# Patient Record
Sex: Female | Born: 1945
Health system: Southern US, Community
[De-identification: ages and names within clinical notes are randomized; demographics above are authoritative.]

## PROBLEM LIST (undated history)

## (undated) DIAGNOSIS — L309 Dermatitis, unspecified: Secondary | ICD-10-CM

## (undated) DIAGNOSIS — I10 Essential (primary) hypertension: Secondary | ICD-10-CM

## (undated) DIAGNOSIS — C50919 Malignant neoplasm of unspecified site of unspecified female breast: Secondary | ICD-10-CM

## (undated) DIAGNOSIS — E871 Hypo-osmolality and hyponatremia: Secondary | ICD-10-CM

## (undated) DIAGNOSIS — J189 Pneumonia, unspecified organism: Secondary | ICD-10-CM

## (undated) DIAGNOSIS — E7800 Pure hypercholesterolemia, unspecified: Secondary | ICD-10-CM

## (undated) DIAGNOSIS — E785 Hyperlipidemia, unspecified: Secondary | ICD-10-CM

## (undated) DIAGNOSIS — T7840XA Allergy, unspecified, initial encounter: Secondary | ICD-10-CM

## (undated) HISTORY — DX: Hyperlipidemia, unspecified: E78.5

## (undated) HISTORY — DX: Dermatitis, unspecified: L30.9

## (undated) HISTORY — DX: Pneumonia, unspecified organism: J18.9

## (undated) HISTORY — DX: Hypo-osmolality and hyponatremia: E87.1

## (undated) HISTORY — DX: Essential (primary) hypertension: I10

## (undated) HISTORY — PX: BREAST SURGERY: SHX581

## (undated) HISTORY — DX: Allergy, unspecified, initial encounter: T78.40XA

---

## 1998-04-09 ENCOUNTER — Other Ambulatory Visit: Admission: RE | Admit: 1998-04-09 | Discharge: 1998-04-09 | Payer: Self-pay | Admitting: Obstetrics and Gynecology

## 1999-05-07 ENCOUNTER — Other Ambulatory Visit: Admission: RE | Admit: 1999-05-07 | Discharge: 1999-05-07 | Payer: Self-pay | Admitting: Obstetrics and Gynecology

## 1999-12-02 ENCOUNTER — Encounter: Admission: RE | Admit: 1999-12-02 | Discharge: 1999-12-02 | Payer: Self-pay | Admitting: Hematology and Oncology

## 1999-12-02 ENCOUNTER — Encounter: Payer: Self-pay | Admitting: Hematology and Oncology

## 2000-02-24 ENCOUNTER — Encounter: Payer: Self-pay | Admitting: Hematology and Oncology

## 2000-02-24 ENCOUNTER — Encounter: Admission: RE | Admit: 2000-02-24 | Discharge: 2000-02-24 | Payer: Self-pay | Admitting: Hematology and Oncology

## 2000-06-06 ENCOUNTER — Other Ambulatory Visit: Admission: RE | Admit: 2000-06-06 | Discharge: 2000-06-06 | Payer: Self-pay | Admitting: Obstetrics and Gynecology

## 2000-07-12 ENCOUNTER — Encounter: Payer: Self-pay | Admitting: Hematology and Oncology

## 2000-07-12 ENCOUNTER — Encounter: Admission: RE | Admit: 2000-07-12 | Discharge: 2000-07-12 | Payer: Self-pay | Admitting: Hematology and Oncology

## 2000-07-17 ENCOUNTER — Encounter: Admission: RE | Admit: 2000-07-17 | Discharge: 2000-07-17 | Payer: Self-pay | Admitting: Hematology and Oncology

## 2000-07-17 ENCOUNTER — Encounter: Payer: Self-pay | Admitting: Hematology and Oncology

## 2001-02-20 ENCOUNTER — Encounter: Payer: Self-pay | Admitting: Hematology and Oncology

## 2001-02-20 ENCOUNTER — Encounter: Admission: RE | Admit: 2001-02-20 | Discharge: 2001-02-20 | Payer: Self-pay | Admitting: Hematology and Oncology

## 2001-08-03 ENCOUNTER — Encounter: Payer: Self-pay | Admitting: Hematology and Oncology

## 2001-08-03 ENCOUNTER — Encounter: Admission: RE | Admit: 2001-08-03 | Discharge: 2001-08-03 | Payer: Self-pay | Admitting: Hematology and Oncology

## 2001-10-30 ENCOUNTER — Other Ambulatory Visit: Admission: RE | Admit: 2001-10-30 | Discharge: 2001-10-30 | Payer: Self-pay | Admitting: Obstetrics and Gynecology

## 2001-12-31 ENCOUNTER — Encounter: Admission: RE | Admit: 2001-12-31 | Discharge: 2001-12-31 | Payer: Self-pay | Admitting: Specialist

## 2001-12-31 ENCOUNTER — Encounter: Payer: Self-pay | Admitting: Specialist

## 2002-05-10 ENCOUNTER — Encounter: Payer: Self-pay | Admitting: Hematology and Oncology

## 2002-05-10 ENCOUNTER — Ambulatory Visit (HOSPITAL_COMMUNITY): Admission: RE | Admit: 2002-05-10 | Discharge: 2002-05-10 | Payer: Self-pay | Admitting: Hematology and Oncology

## 2002-08-09 ENCOUNTER — Encounter: Payer: Self-pay | Admitting: Hematology and Oncology

## 2002-08-09 ENCOUNTER — Encounter: Admission: RE | Admit: 2002-08-09 | Discharge: 2002-08-09 | Payer: Self-pay | Admitting: Hematology and Oncology

## 2002-09-18 ENCOUNTER — Ambulatory Visit (HOSPITAL_COMMUNITY): Admission: RE | Admit: 2002-09-18 | Discharge: 2002-09-18 | Payer: Self-pay | Admitting: *Deleted

## 2002-09-18 ENCOUNTER — Encounter: Payer: Self-pay | Admitting: *Deleted

## 2002-11-14 ENCOUNTER — Other Ambulatory Visit: Admission: RE | Admit: 2002-11-14 | Discharge: 2002-11-14 | Payer: Self-pay | Admitting: Obstetrics and Gynecology

## 2002-11-18 ENCOUNTER — Ambulatory Visit (HOSPITAL_COMMUNITY): Admission: RE | Admit: 2002-11-18 | Discharge: 2002-11-18 | Payer: Self-pay | Admitting: Gastroenterology

## 2003-10-15 ENCOUNTER — Encounter: Admission: RE | Admit: 2003-10-15 | Discharge: 2003-10-15 | Payer: Self-pay | Admitting: Hematology and Oncology

## 2004-03-23 ENCOUNTER — Other Ambulatory Visit: Admission: RE | Admit: 2004-03-23 | Discharge: 2004-03-23 | Payer: Self-pay | Admitting: Obstetrics and Gynecology

## 2004-11-30 ENCOUNTER — Encounter: Admission: RE | Admit: 2004-11-30 | Discharge: 2004-11-30 | Payer: Self-pay | Admitting: Internal Medicine

## 2005-04-01 ENCOUNTER — Encounter: Admission: RE | Admit: 2005-04-01 | Discharge: 2005-04-01 | Payer: Self-pay | Admitting: Internal Medicine

## 2006-01-16 ENCOUNTER — Encounter: Admission: RE | Admit: 2006-01-16 | Discharge: 2006-01-16 | Payer: Self-pay | Admitting: Internal Medicine

## 2006-02-06 ENCOUNTER — Encounter: Admission: RE | Admit: 2006-02-06 | Discharge: 2006-02-06 | Payer: Self-pay | Admitting: Internal Medicine

## 2006-02-24 ENCOUNTER — Ambulatory Visit (HOSPITAL_BASED_OUTPATIENT_CLINIC_OR_DEPARTMENT_OTHER): Admission: RE | Admit: 2006-02-24 | Discharge: 2006-02-24 | Payer: Self-pay | Admitting: Surgery

## 2006-02-24 ENCOUNTER — Encounter: Admission: RE | Admit: 2006-02-24 | Discharge: 2006-02-24 | Payer: Self-pay | Admitting: Surgery

## 2006-02-24 ENCOUNTER — Encounter (INDEPENDENT_AMBULATORY_CARE_PROVIDER_SITE_OTHER): Payer: Self-pay | Admitting: Specialist

## 2006-11-22 ENCOUNTER — Ambulatory Visit: Payer: Self-pay | Admitting: Internal Medicine

## 2006-11-29 ENCOUNTER — Encounter: Payer: Self-pay | Admitting: Cardiology

## 2006-11-29 ENCOUNTER — Ambulatory Visit: Payer: Self-pay

## 2006-12-07 ENCOUNTER — Ambulatory Visit: Payer: Self-pay | Admitting: Internal Medicine

## 2007-03-09 ENCOUNTER — Encounter: Admission: RE | Admit: 2007-03-09 | Discharge: 2007-03-09 | Payer: Self-pay | Admitting: Obstetrics and Gynecology

## 2008-03-10 ENCOUNTER — Encounter: Admission: RE | Admit: 2008-03-10 | Discharge: 2008-03-10 | Payer: Self-pay | Admitting: Internal Medicine

## 2009-02-20 ENCOUNTER — Encounter: Admission: RE | Admit: 2009-02-20 | Discharge: 2009-02-20 | Payer: Self-pay | Admitting: Internal Medicine

## 2010-02-22 ENCOUNTER — Encounter: Admission: RE | Admit: 2010-02-22 | Discharge: 2010-02-22 | Payer: Self-pay | Admitting: Obstetrics and Gynecology

## 2010-03-01 ENCOUNTER — Emergency Department (HOSPITAL_COMMUNITY)
Admission: EM | Admit: 2010-03-01 | Discharge: 2010-03-02 | Payer: Self-pay | Source: Home / Self Care | Admitting: Emergency Medicine

## 2010-06-18 ENCOUNTER — Ambulatory Visit (HOSPITAL_BASED_OUTPATIENT_CLINIC_OR_DEPARTMENT_OTHER): Admission: RE | Admit: 2010-06-18 | Discharge: 2010-06-18 | Payer: Self-pay | Admitting: Ophthalmology

## 2010-12-26 ENCOUNTER — Encounter: Payer: Self-pay | Admitting: Obstetrics and Gynecology

## 2010-12-26 ENCOUNTER — Encounter: Payer: Self-pay | Admitting: Internal Medicine

## 2011-01-26 ENCOUNTER — Other Ambulatory Visit: Payer: Self-pay | Admitting: Obstetrics and Gynecology

## 2011-01-26 DIAGNOSIS — Z1231 Encounter for screening mammogram for malignant neoplasm of breast: Secondary | ICD-10-CM

## 2011-02-19 LAB — POCT I-STAT, CHEM 8
BUN: 12 mg/dL (ref 6–23)
Calcium, Ion: 0.87 mmol/L — ABNORMAL LOW (ref 1.12–1.32)
Chloride: 107 mEq/L (ref 96–112)
Chloride: 108 mEq/L (ref 96–112)
HCT: 41 % (ref 36.0–46.0)
HCT: 43 % (ref 36.0–46.0)
Sodium: 136 mEq/L (ref 135–145)
TCO2: 25 mmol/L (ref 0–100)
TCO2: 26 mmol/L (ref 0–100)

## 2011-02-25 ENCOUNTER — Ambulatory Visit
Admission: RE | Admit: 2011-02-25 | Discharge: 2011-02-25 | Disposition: A | Discharge status: Home / Self Care | Payer: Medicare Other | Source: Ambulatory Visit | Attending: Obstetrics and Gynecology | Admitting: Obstetrics and Gynecology

## 2011-02-25 DIAGNOSIS — Z1231 Encounter for screening mammogram for malignant neoplasm of breast: Secondary | ICD-10-CM

## 2011-02-27 LAB — DIFFERENTIAL
Basophils Absolute: 0.1 10*3/uL (ref 0.0–0.1)
Basophils Relative: 1 % (ref 0–1)
Eosinophils Relative: 0 % (ref 0–5)

## 2011-02-27 LAB — BASIC METABOLIC PANEL
BUN: 11 mg/dL (ref 6–23)
GFR calc Af Amer: >60 mL/min (ref >60)
Glucose, Bld: 114 mg/dL — ABNORMAL HIGH (ref 70–99)
Potassium: 3.3 mEq/L — ABNORMAL LOW (ref 3.5–5.1)
Sodium: 132 mEq/L — ABNORMAL LOW (ref 135–145)

## 2011-02-27 LAB — POCT CARDIAC MARKERS
CKMB, poc: 5.6 ng/mL (ref 1.0–8.0)
Myoglobin, poc: 93.5 ng/mL (ref 12–200)

## 2011-02-27 LAB — CBC
Hemoglobin: 13.4 g/dL (ref 12.0–15.0)
MCHC: 34.4 g/dL (ref 30.0–36.0)
MCV: 93.8 fL (ref 78.0–100.0)
WBC: 13 10*3/uL — ABNORMAL HIGH (ref 4.0–10.5)

## 2011-04-22 NOTE — Op Note (Signed)
NAMECAMEREN, ODWYER                  ACCOUNT NO.:  [id number]   MEDICAL RECORD NO.:  [id number]          PATIENT TYPE:  AMB   LOCATION:  DSC                          FACILITY:  [facility]   PHYSICIAN:  Currie Paris, M.D.DATE OF BIRTH:  1946-05-16   DATE OF PROCEDURE:  02/24/2006  DATE OF DISCHARGE:  02/24/2006                                 OPERATIVE REPORT   CCS [id number].   PREOP DIAGNOSIS:  Calcifications right breast, history of right breast  cancer.   POSTOPERATIVE DIAGNOSIS:  Calcifications right breast, history of right  breast cancer.   OPERATION:  Needle guided excision, right breast calcifications.   SURGEON:  Dr. Jamey Ripa.   ANESTHESIA:  General.   CLINICAL HISTORY:  This is a pleasant 65 year old [place] lady who  recently had a mammogram showing new development of calcifications in her  right breast. She was unable for technical reasons to have a core biopsy  done. She has a history of right breast cancer, presenting in the axillary  tail of [place].   After discussion with the patient via her daughter as a Nurse, learning disability and today  with her son, she confirmed plans for the procedure. The guidewire had  already been placed and we initialed the right side as the operative side.   The patient was taken to operating room.  After satisfactory general  anesthesia obtained the right breast was prepped and draped. The time-out  occurred.   The guidewire entered near the sternum and tracked medially in the upper  inner quadrant of the right breast. I made a transverse incision directly  over the guidewire tract. I divided a little bit of subcutaneous tissue and  then excised a cylinder of tissue around the guidewire, taking about a 1 cm  in diameter area. We went all the way down to chest wall. I felt I was well  around the area in question. However, the patient has very small breasts and  I did not take excessive margins.   Once this was done, I infiltrated the area with  0.25% plain Marcaine to help  with postop analgesia. I elevated the breast off of the chest wall muscle  fascia superiorly and inferiorly and closed the deep breast tissue with some  3-0 Vicryl interrupted. The subcu was closed with a little bit of 3-0 Vicryl  on the skin with a 4-0 Monocryl subcuticular plus Dermabond.   Radiology reported that the specimen mammogram showed the calcifications in  the specimen.   The patient tolerated procedure well. There no operative complications and  all counts were correct.      Currie Paris, M.D.  Electronically Signed     CJS/MEDQ  D:  02/24/2006  T:  02/27/2006  Job:  [id number]   cc:   Gwen Pounds, MD  Fax: [phone]

## 2011-04-22 NOTE — Op Note (Signed)
   [name], Jessica Zavala                            ACCOUNT NO.:  [id number]   MEDICAL RECORD NO.:  [id number]                   PATIENT TYPE:  AMB   LOCATION:  ENDO                                 FACILITY:  [facility]   PHYSICIAN:  John C. Madilyn Fireman, M.D.                 DATE OF BIRTH:  Oct 19, 1946   DATE OF PROCEDURE:  11/18/2002  DATE OF DISCHARGE:                                 OPERATIVE REPORT   PROCEDURE:  Colonoscopy.   INDICATIONS FOR PROCEDURE:  Screening colonoscopy.   DESCRIPTION OF PROCEDURE:  The patient was placed in the left lateral  decubitus position and placed on the pulse monitor with continuous low-flow  oxygen delivered by nasal cannula.  She was sedated with 50 mcg IV fentanyl  and 5 mg IV Versed.  The Olympus video colonoscope was inserted into the  rectum and advanced to the cecum, confirmed by transillumination of  McBurney's point and visualization of the ileocecal valve and appendiceal  orifice.  The prep was excellent.  The cecum, ascending, transverse, and  descending colon all appeared normal with no masses, polyps, diverticuli or  other mucosal abnormalities.  In the sigmoid colon, there were seen a few  scattered diverticuli and no other abnormalities.  The rectum appeared  normal and a retroflexed view of the anus revealed no obvious internal  hemorrhoids.  The colonoscope was then withdrawn and the patient returned to  the recovery room in stable condition.  She tolerated the procedure well and  there were no immediate complications.   IMPRESSION:  Few scattered diverticuli, otherwise normal study.   PLAN:  Next colon screening by sigmoidoscopy in five years.                                                John C. Madilyn Fireman, M.D.    JCH/MEDQ  D:  11/18/2002  T:  11/18/2002  Job:  [id number]

## 2011-04-22 NOTE — Letter (Signed)
November 22, 2006    Gwen Pounds, MD  [address]  [place], [place] [address]   RE:  Jessica Zavala, Jessica Zavala  MRN:  [id number]  /  DOB:  1946-05-06   Dear Jonny Ruiz,   It was a pleasure to see your patient in consultation today.  She comes  with her daughter.   The patient is a 65 year old [place] immigrant whose understanding of  English is apparently better than her speaking, the latter of which is  very limited.  Her past medical history is notable for hypertension,  dyslipidemia, and breast cancer.   Upon seeing you, she had an abnormal electrocardiogram which is  described below.  It is not clear to me to what extent she had symptoms  potentially attributable to this slow heart rate.  As I tried to review  it with her daughter, there appears to be some symptoms of dyspnea or  light breathing which she thinks retrospectively occurred  simultaneously with the abnormal electrocardiogram.  These symptoms have  been going on only for a couple of months.   She notes mild dyspnea on exertion.  She has no chest discomfort, neck  or arm discomfort with exertion.   She has no history of syncope, presyncope.   She has lightheadedness which she describes as occurring when she stands  and resolves after a couple of minutes.   Her past medical history is notable primarily as above.   Past surgical history is notable for her breast surgery and a stomach  surgery, the specifics of which I do not have, and bilateral cataract  surgery.   MEDICATIONS:  1. Benicar 40.  2. Lipitor 10.   ALLERGIES:  No known drug allergies.   SOCIAL HISTORY:  She is married.  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  She has 4 children.  She does not use  cigarettes or recreational drugs.  She does drink alcohol occasionally  and caffeine daily.   She has become a vegetarian since her breast cancer.   PHYSICAL EXAMINATION:  On examination, she is an [place] woman, quite  petite.  Her  blood pressure is elevated at 180/76.  Her weight was 117.  HEENT:  Demonstrate no icterus, no xanthopia.  NECK:  Her neck veins were flat, carotids brisk and full bilaterally  without bruits.  BACK:  Without kyphosis or scoliosis.  LUNGS:  Clear.  HEART SOUNDS:  Regular, without murmurs or gallops.  ABDOMEN:  Soft, with active bowel sounds, without hepatomegaly.  EXTREMITIES:  Femoral pulses were 2+, distal pulses were intact.  There  was no clubbing, cyanosis, or edema.  NEUROLOGIC:  Grossly normal.  SKIN:  Warm and dry.   Getting to the most interesting part, John, the electrocardiogram today  demonstrated sinus rhythm with isolated PACs with a negative P-wave  morphology in lead 2.  The sinus leads conduct with a PR interval of 160  milliseconds and the negative P-wave associated beats conduct with a PR  interval of 120 milliseconds.  There was also some isolated PC.   The electrocardiogram from your office done in 2004 demonstrated sinus  rhythm.   Electrocardiogram from a week and a half ago demonstrated a sinus rhythm  with PACs conducted with a VA time of 500 milliseconds similar to the [place]  time of the coupled beats today but on this occasion was not conducted.  The mechanism of this suggests that she has a retrograde slowly  conducting  connection similar to PJRT which sometimes finds the circuit  open and sometimes not.   IMPRESSION:  1. Abnormal electrocardiogram manifested by a bradycardia associated      with a premature beat in an atrial bigeminal pattern with      morphology suggesting a retrogradely conducting P-wave.  2. Isolated PVCs.  3. Systolic hypertension.  4. [place] with poor [place] skills.   John, Jessica Zavala has bradycardia.  I suspect the mechanism is outlined  above.  It is not clear to what degree this is symptomatic.  To that  end, I have given her an event recorder to try to clarify:  A.  Whether she is having tachycardia which might be  anticipated if she  were to run the circuit.  B.  Is having bradycardia associated with a blocked PACs as you astutely  identified.   I will plan to sit down with her in about 5 weeks' time to review that.   Given the dyspnea, we will also plan to get an echocardiogram when she  comes in to the office, as with her country of origin she is certainly  at risk valvular heart disease.   John, thanks very much for asking [place] to see her.  Hopefully this finds  you well, and have a very merry Christmas with your family.    Sincerely,      Duke Salvia, MD, [facility]  Electronically Signed    SCK/MedQ  DD: 11/22/2006  DT: 11/22/2006  Job #: [id number]

## 2011-04-26 NOTE — Op Note (Signed)
NAMEPAETON, Jessica Zavala                  ACCOUNT NO.:  [id number]  MEDICAL RECORD NO.:  [id number]          PATIENT TYPE:  AMB  LOCATION:  DSC                          FACILITY:  [facility]  PHYSICIAN:  Pasty Spillers. Maple Hudson, M.D. DATE OF BIRTH:  1946/11/02  DATE OF PROCEDURE:  06/18/2010 DATE OF DISCHARGE:                              OPERATIVE REPORT  PREOPERATIVE DIAGNOSIS:  Recurrent exotropia.  POSTOPERATIVE DIAGNOSIS:  Recurrent exotropia.  PROCEDURES: 1. Right lateral rectus muscle resection, 7.0 mm, adjustable     technique. 2. Right medial rectus muscle resection, 7.0 mm.  SURGEON:  Pasty Spillers. Milcah Dulany, MD  ANESTHESIA:  General (laryngeal mask).  COMPLICATIONS:  None.  PROCEDURE:  After preop evaluation including informed consent, the patient was taken to the operating room where she was identified by me. General anesthesia was induced without difficulty after placement of appropriate monitors.  The patient was prepped and draped in standard sterile fashion.  Lid speculum was placed in the right eye.  A limbal conjunctival peritomy of 2 clock hours extent was made temporally in the right eye with Westcott scissors, with relaxing incisions in the superotemporal and inferotemporal quadrants.  The right lateral rectus muscle was engaged on a series of muscle hooks and cleared of its surrounding fascial attachments and scar tissue.  It was noted to be inserted 12 mm posterior to the limbus.  Its tendon was secured with a double-arm 6-0 Vicryl suture, with a double-locking bite at each border of the muscle, 1 mm from the insertion.  The muscle was disinserted.  Each pole suture was passed in crossed-swords fashion into the original muscle stump (not the current site of attachment), approximately 6 mm posterior to the limbus.  The muscle was drawn up to the level of the original insertion, and the 2 pole sutures were tied together 10 cm above sclera.  With the muscle held at its  original location, the 2 pole sutures were then joined together with a needle driver at a measured distance of 12 mm above sclera, and a noose suture was tied around the pole sutures at this location.  The muscle was then allowed to hang back until the noose knot reached sclera, creating a hang back recession of 12 mm from the original insertion and approximately 6-7 mm from the current insertion.  The superior corner of the conjunctival flap was opposed to sclera at the level of the original insertion with a 6-0 plain gut suture.  The superior relaxing incision was closed with a second 6-0 plain gut suture.  The inferior corner of the flap was loosely joined to the adjacent conjunctiva with a large loop of 6-0 plain gut, leaving the flap open to facilitate suture adjustment.  Traction suture of 6-0 silk was placed at the temporal limbus.  A limbal conjunctival peritomy was made nasally in the right eye with Westcott scissors, and with relaxing incisions of the superonasal and inferonasal quadrants.  The right medial rectus muscle was engaged on a series of muscle hooks and cleared of its fascial attachments and scar tissue.  The muscle was spread between two self-retaining  hooks.  A 2-mm bite was taken of the center of muscle belly at a measured distance of 7.0 mm posterior to the insertion, and a knot was tied securely at this location.  The needle at each end of the double-arm suture was passed from the center of muscle belly to the periphery, parallel to and 7.0 mm posterior to the insertion.  A double-locking bite was placed at each border of the muscle.  Resection clamp was placed on the muscle just anterior to these sutures.  The muscle was disinserted.  Each pole suture was passed posteriorly to anteriorly near the corresponding end of the muscle stump, then anteriorly to posteriorly near the center of the muscle stump, then posteriorly to anteriorly through the center of the  muscle belly, just posterior to the previously placed knot.  The muscle was drawn up to the level of the insertion and all slack was removed before the suture ends were tied securely.  The clamp was removed.  The portion of the muscle anterior to the suture was carefully excised.  The anterior 3 mm of the conjunctival flap was excised.  The conjunctival flap was reapposed to sclera and adjacent conjunctiva with a 6-0 plain gut suture at each anterior corner of the flap, leaving the flap recessed approximately 3 mm posterior to the limbus.  The relaxing incisions were each closed with a single 6-0 plain gut suture.  The pole, noose, and traction suture of the lateral rectus were taped to the right cheek with a Steri-Strip.  TobraDex ointment was placed in the eye.  A soft patch was placed over the eye.  The patient was awakened without difficulty and taken to the recovery room in stable condition, having suffered no intraoperative or immediate postop complications.     Pasty Spillers. Maple Hudson, M.D.    Cheron Schaumann  D:  06/18/2010  T:  06/18/2010  Job:  [id number]  Electronically Signed by Verne Carrow M.D. on 04/26/2011 10:59:25 AM

## 2011-09-04 ENCOUNTER — Emergency Department (HOSPITAL_COMMUNITY)
Admission: EM | Admit: 2011-09-04 | Discharge: 2011-09-04 | Disposition: A | Discharge status: Home / Self Care | Payer: Medicare Other | Attending: Emergency Medicine | Admitting: Emergency Medicine

## 2011-09-04 DIAGNOSIS — I10 Essential (primary) hypertension: Secondary | ICD-10-CM | POA: Insufficient documentation

## 2011-09-04 DIAGNOSIS — Z853 Personal history of malignant neoplasm of breast: Secondary | ICD-10-CM | POA: Insufficient documentation

## 2011-09-04 DIAGNOSIS — R059 Cough, unspecified: Secondary | ICD-10-CM | POA: Insufficient documentation

## 2011-09-04 DIAGNOSIS — R05 Cough: Secondary | ICD-10-CM | POA: Insufficient documentation

## 2012-01-06 ENCOUNTER — Ambulatory Visit (INDEPENDENT_AMBULATORY_CARE_PROVIDER_SITE_OTHER): Payer: Medicare Other | Admitting: Family Medicine

## 2012-01-06 DIAGNOSIS — L309 Dermatitis, unspecified: Secondary | ICD-10-CM | POA: Insufficient documentation

## 2012-01-06 DIAGNOSIS — L241 Irritant contact dermatitis due to oils and greases: Secondary | ICD-10-CM

## 2012-01-06 DIAGNOSIS — I10 Essential (primary) hypertension: Secondary | ICD-10-CM

## 2012-01-06 MED ORDER — PREDNISONE 20 MG PO TABS: 40.0000 mg | ORAL_TABLET | Freq: Every day | ORAL | Status: AC

## 2012-01-06 MED ORDER — TRIAMCINOLONE ACETONIDE 0.1 % EX CREA: TOPICAL_CREAM | Freq: Three times a day (TID) | CUTANEOUS | Status: DC

## 2012-01-06 NOTE — Progress Notes (Signed)
Subjective: Mrs. true is a 66 year old [place] woman with recurring hand eczema. This comes every winter and that this episode in particular his lasted only 2 weeks she has seen Dr. Cheron Every in the past. He has prescribed lotions and prednisone for the past. Symptoms today are itching and dry scaly patches on both dorsal hands.  Objective: Diffuse patchy eczematous changes on both dorsal hands with excoriations. Hand range of motion is normal. Facial skin and arms are normal.  Assessment: Winter eczema.  Plan prednisone to 20 mg 2 daily for 5 days, and triamcinolone cream 0.1% 3 times a day when necessary

## 2012-01-06 NOTE — Patient Instructions (Signed)

## 2012-01-19 ENCOUNTER — Other Ambulatory Visit: Payer: Self-pay | Admitting: Obstetrics and Gynecology

## 2012-01-19 DIAGNOSIS — Z1231 Encounter for screening mammogram for malignant neoplasm of breast: Secondary | ICD-10-CM

## 2012-03-02 ENCOUNTER — Ambulatory Visit
Admission: RE | Admit: 2012-03-02 | Discharge: 2012-03-02 | Disposition: A | Discharge status: Home / Self Care | Payer: Medicare Other | Source: Ambulatory Visit | Attending: Obstetrics and Gynecology | Admitting: Obstetrics and Gynecology

## 2012-03-02 DIAGNOSIS — Z1231 Encounter for screening mammogram for malignant neoplasm of breast: Secondary | ICD-10-CM

## 2012-03-20 ENCOUNTER — Other Ambulatory Visit: Payer: Self-pay | Admitting: Internal Medicine

## 2012-04-28 ENCOUNTER — Ambulatory Visit (INDEPENDENT_AMBULATORY_CARE_PROVIDER_SITE_OTHER): Payer: Medicare Other | Admitting: Family Medicine

## 2012-04-28 VITALS — BP 145/75 | HR 75 | Temp 97.5°F | Resp 16 | Ht <= 58 in | Wt 120.0 lb

## 2012-04-28 DIAGNOSIS — L309 Dermatitis, unspecified: Secondary | ICD-10-CM

## 2012-04-28 DIAGNOSIS — L259 Unspecified contact dermatitis, unspecified cause: Secondary | ICD-10-CM

## 2012-04-28 MED ORDER — METHYLPREDNISOLONE ACETATE 80 MG/ML IJ SUSP
80.0000 mg | Freq: Once | INTRAMUSCULAR | Status: AC
  Administered 2012-04-28: 80 mg via INTRAMUSCULAR

## 2012-04-28 MED ORDER — TRIAMCINOLONE ACETONIDE 0.1 % EX CREA: TOPICAL_CREAM | Freq: Three times a day (TID) | CUTANEOUS | Status: DC

## 2012-04-28 NOTE — Progress Notes (Signed)
This 66 year old [place] woman comes in with recurrence of the hand and wrist eczema. She also has some itching in thickening of the right coronary upper neck. She's had this for years. She has done well with triamcinolone but notes that her prescription has expired. The rash is characterized by thickening, dryness, and itchiness.  Objective: Excoriated dorsal hands and wrists with mild fissuring and redness.  Chest clear  Oropharynx clear  Assessment: Eczema, chronic  Plan: Depo-Medrol 80 today, refilled triamcinolone

## 2012-04-28 NOTE — Patient Instructions (Signed)

## 2012-06-14 ENCOUNTER — Ambulatory Visit (INDEPENDENT_AMBULATORY_CARE_PROVIDER_SITE_OTHER): Payer: Medicare Other | Admitting: Physician Assistant

## 2012-06-14 VITALS — BP 130/64 | HR 75 | Temp 97.7°F | Resp 16 | Ht <= 58 in | Wt 112.4 lb

## 2012-06-14 DIAGNOSIS — L309 Dermatitis, unspecified: Secondary | ICD-10-CM

## 2012-06-14 DIAGNOSIS — E7800 Pure hypercholesterolemia, unspecified: Secondary | ICD-10-CM

## 2012-06-14 DIAGNOSIS — L259 Unspecified contact dermatitis, unspecified cause: Secondary | ICD-10-CM

## 2012-06-14 MED ORDER — METHYLPREDNISOLONE ACETATE 80 MG/ML IJ SUSP
80.0000 mg | Freq: Once | INTRAMUSCULAR | Status: AC
  Administered 2012-06-14: 80 mg via INTRAMUSCULAR

## 2012-06-14 MED ORDER — TRIAMCINOLONE ACETONIDE 0.5 % EX CREA: TOPICAL_CREAM | Freq: Two times a day (BID) | CUTANEOUS | Status: DC

## 2012-06-14 NOTE — Progress Notes (Signed)
  Subjective:    Patient ID: Jessica Zavala, female    DOB: 1946/04/17, 66 y.o.   MRN: [id number]  HPI  Pt presents to clinic with continued rash on her arms.  She has long standing eczema that flairs up at times.  She sews furniture at work.  She has had no new exposures that she knows of.  She has been using triamcinolone 0.1% but it does not seem to be helping.  She has had an injection before and that seems to help.  The area is really itchy and seems swollen.  Review of Systems  Skin: Positive for rash.       Objective:   Physical Exam  Constitutional: She is oriented to person, place, and time. She appears well-developed and well-nourished.  HENT:  Head: Normocephalic and atraumatic.  Right Ear: External ear normal.  Left Ear: External ear normal.  Eyes: Conjunctivae are normal.  Neurological: She is alert and oriented to person, place, and time.  Skin: Skin is warm, dry and intact. Rash noted. Rash is maculopapular.     Psychiatric: She has a normal mood and affect. Her behavior is normal. Judgment and thought content normal.          Assessment & Plan:   1. Eczema  triamcinolone cream (KENALOG) 0.5 %, methylPREDNISolone acetate (DEPO-MEDROL) injection 80 mg  2. Hypercholesterolemia     D/w pt chronic eczema - probably response to something at work.  She should use the triamcinolone daily to reduce inflammation.  Today will give injection but d/w pt concerns for repeat steroids and controlling it is a better option.  Visit interpreted by patient's husband.

## 2012-08-17 ENCOUNTER — Other Ambulatory Visit: Payer: Self-pay | Admitting: Internal Medicine

## 2012-08-17 DIAGNOSIS — R109 Unspecified abdominal pain: Secondary | ICD-10-CM

## 2012-08-24 ENCOUNTER — Ambulatory Visit
Admission: RE | Admit: 2012-08-24 | Discharge: 2012-08-24 | Disposition: A | Discharge status: Home / Self Care | Payer: Medicare Other | Source: Ambulatory Visit | Attending: Internal Medicine | Admitting: Internal Medicine

## 2012-08-24 DIAGNOSIS — R109 Unspecified abdominal pain: Secondary | ICD-10-CM

## 2012-12-04 ENCOUNTER — Ambulatory Visit (INDEPENDENT_AMBULATORY_CARE_PROVIDER_SITE_OTHER): Payer: Medicare Other | Admitting: Emergency Medicine

## 2012-12-04 VITALS — BP 159/92 | HR 69 | Temp 97.9°F | Resp 16 | Ht <= 58 in | Wt 125.0 lb

## 2012-12-04 DIAGNOSIS — L309 Dermatitis, unspecified: Secondary | ICD-10-CM

## 2012-12-04 DIAGNOSIS — L259 Unspecified contact dermatitis, unspecified cause: Secondary | ICD-10-CM

## 2012-12-04 DIAGNOSIS — R21 Rash and other nonspecific skin eruption: Secondary | ICD-10-CM

## 2012-12-04 MED ORDER — BETAMETHASONE DIPROPIONATE AUG 0.05 % EX CREA: TOPICAL_CREAM | Freq: Two times a day (BID) | CUTANEOUS | Status: DC

## 2012-12-04 NOTE — Progress Notes (Signed)
  Subjective:    Patient ID: Jessica Zavala, female    DOB: 1946-01-22, 65 y.o.   MRN: [id number]  HPI  Patient presents today with rash on arms and back. Patient doesn't recall having a family history of eczema. She's been seen here multiple times with her rash. She's gotten Depo-Medrol injections in the past. She also has a history of hypertension. It isn't clear whether this has been well-controlled or not the   Review of Systems     Objective:   Physical Exam distress skin the skin over the back of the neck. There changes of eczema involving both hands        Assessment & Plan:  Patient here with atopic eczema. Her blood pressure is high today and I do not want to give her prednisone pills. Have changed her medication to Diprolene AF .

## 2012-12-04 NOTE — Patient Instructions (Signed)

## 2012-12-04 NOTE — Progress Notes (Deleted)
  Subjective:    Patient ID: Jessica Zavala, female    DOB: 07/14/1946, 66 y.o.   MRN: [id number]  HPI    Review of Systems     Objective:   Physical Exam        Assessment & Plan:

## 2013-02-19 ENCOUNTER — Other Ambulatory Visit: Payer: Self-pay | Admitting: Physician Assistant

## 2013-02-28 ENCOUNTER — Other Ambulatory Visit: Payer: Self-pay

## 2013-02-28 DIAGNOSIS — Z1231 Encounter for screening mammogram for malignant neoplasm of breast: Secondary | ICD-10-CM

## 2013-04-05 ENCOUNTER — Ambulatory Visit: Payer: Medicare Other

## 2013-04-12 ENCOUNTER — Ambulatory Visit
Admission: RE | Admit: 2013-04-12 | Discharge: 2013-04-12 | Disposition: A | Discharge status: Home / Self Care | Payer: Medicare Other | Source: Ambulatory Visit

## 2013-04-12 DIAGNOSIS — Z1231 Encounter for screening mammogram for malignant neoplasm of breast: Secondary | ICD-10-CM

## 2013-04-22 ENCOUNTER — Ambulatory Visit (INDEPENDENT_AMBULATORY_CARE_PROVIDER_SITE_OTHER): Payer: Medicare Other | Admitting: Physician Assistant

## 2013-04-22 VITALS — BP 162/98 | HR 68 | Temp 97.9°F | Resp 16 | Ht <= 58 in | Wt 126.0 lb

## 2013-04-22 DIAGNOSIS — L259 Unspecified contact dermatitis, unspecified cause: Secondary | ICD-10-CM

## 2013-04-22 DIAGNOSIS — Z131 Encounter for screening for diabetes mellitus: Secondary | ICD-10-CM

## 2013-04-22 DIAGNOSIS — L309 Dermatitis, unspecified: Secondary | ICD-10-CM

## 2013-04-22 MED ORDER — METHYLPREDNISOLONE ACETATE 80 MG/ML IJ SUSP
80.0000 mg | Freq: Once | INTRAMUSCULAR | Status: AC
  Administered 2013-04-22: 80 mg via INTRAMUSCULAR

## 2013-04-22 NOTE — Progress Notes (Signed)
   [address], [place] [address]   Phone [phone]  Subjective:    Patient ID: Jessica Zavala, female    DOB: 08/08/1946, 67 y.o.   MRN: [id number]  HPI  Pt presents to clinic with recurrent eczema.  She has been good since July and then recently she started having the rash again.  She has been using the cream and the rash got worse anyway.  It is very dry and feels tight.  It is very itchy.     Review of Systems  Skin: Positive for rash.       Objective:   Physical Exam  Vitals reviewed. Constitutional: She is oriented to person, place, and time. She appears well-developed and well-nourished.  HENT:  Head: Normocephalic and atraumatic.  Right Ear: External ear normal.  Left Ear: External ear normal.  Pulmonary/Chest: Effort normal.  Neurological: She is alert and oriented to person, place, and time.  Skin: Skin is warm and dry. Rash (thickened dry erythematous skin on arms mainly on the flexor surfaces - some on the neck - mainly on exposed surfaces to air that are not covered by clothing) noted.  Psychiatric: She has a normal mood and affect. Her behavior is normal. Judgment and thought content normal.    Results for orders placed in visit on 04/22/13  GLUCOSE, POCT (MANUAL RESULT ENTRY)      Result Value Range   POC Glucose 100 (*) 70 - 99 mg/dl         Assessment & Plan:  Eczema -I am sure that this is environmental from work. Since pt has not had anything for the rash since July and the DepoMedrol worked so well we will repeat today.  We discussed ways to decrease eczema in the future.- Plan: methylPREDNISolone acetate (DEPO-MEDROL) injection 80 mg  Screening for diabetes mellitus - Plan: POCT glucose (manual entry)  Benny Lennert [facility] 04/22/2013 7:48 PM

## 2013-04-22 NOTE — Patient Instructions (Addendum)
Curel - skin moisture - after shower pat dry and then apply the lotion --

## 2013-05-02 ENCOUNTER — Other Ambulatory Visit: Payer: Self-pay | Admitting: Emergency Medicine

## 2013-05-03 NOTE — Telephone Encounter (Signed)
Jessica Zavala, I see that you just saw the pt for eczema. Do you want to give  RFs of this cream or did you want pt to DC use?

## 2013-08-01 ENCOUNTER — Other Ambulatory Visit: Payer: Self-pay | Admitting: Gastroenterology

## 2013-11-15 ENCOUNTER — Ambulatory Visit (INDEPENDENT_AMBULATORY_CARE_PROVIDER_SITE_OTHER): Payer: Medicare Other | Admitting: Family Medicine

## 2013-11-15 VITALS — BP 144/88 | HR 72 | Temp 97.8°F | Resp 18 | Ht <= 58 in | Wt 124.0 lb

## 2013-11-15 DIAGNOSIS — L309 Dermatitis, unspecified: Secondary | ICD-10-CM

## 2013-11-15 DIAGNOSIS — L259 Unspecified contact dermatitis, unspecified cause: Secondary | ICD-10-CM

## 2013-11-15 HISTORY — DX: Dermatitis, unspecified: L30.9

## 2013-11-15 MED ORDER — METHYLPREDNISOLONE ACETATE 80 MG/ML IJ SUSP
80.0000 mg | Freq: Once | INTRAMUSCULAR | Status: AC
  Administered 2013-11-15: 80 mg via INTRAMUSCULAR

## 2013-11-15 MED ORDER — CLOBETASOL PROPIONATE 0.05 % EX OINT: 1.0000 "application " | TOPICAL_OINTMENT | Freq: Two times a day (BID) | CUTANEOUS | Status: DC

## 2013-11-15 NOTE — Progress Notes (Signed)
Subjective: 67 year old lady who has had a recurrence of her hand eczema for the last 3 weeks. She wears will gloves when she works. She does not know what she gets exposed to that causes her hands to break out. She itches terribly of the hands and forearms. She does not have any rash on her palms, simply the back of the hands.  Objective: Patches of eczema across the knuckles and up the forearm to just below the elbow. This is about the same in both hands, maybe a little worse on the right than the left.  Assessment: Hand eczema  Plan: Clobetasol ointment (she prefers ointments to creams)  Depo-Medrol 80  Return if worse See instructions

## 2013-11-15 NOTE — Patient Instructions (Signed)
Use the clobetasol ointment once or twice daily on the hands until the eczema is much improved, then use less often or stop it until the next outbreak begins. Try to use the ointment before it gets too bad.  Return if not improving

## 2013-12-09 ENCOUNTER — Other Ambulatory Visit: Payer: Self-pay | Admitting: Physician Assistant

## 2013-12-09 ENCOUNTER — Telehealth: Payer: Self-pay

## 2013-12-09 ENCOUNTER — Ambulatory Visit (INDEPENDENT_AMBULATORY_CARE_PROVIDER_SITE_OTHER): Payer: Medicare HMO | Admitting: Family Medicine

## 2013-12-09 VITALS — BP 110/80 | HR 78 | Temp 99.4°F | Resp 16 | Ht <= 58 in | Wt 125.0 lb

## 2013-12-09 DIAGNOSIS — R05 Cough: Secondary | ICD-10-CM

## 2013-12-09 DIAGNOSIS — J09X2 Influenza due to identified novel influenza A virus with other respiratory manifestations: Secondary | ICD-10-CM

## 2013-12-09 DIAGNOSIS — R059 Cough, unspecified: Secondary | ICD-10-CM

## 2013-12-09 DIAGNOSIS — R6889 Other general symptoms and signs: Secondary | ICD-10-CM

## 2013-12-09 DIAGNOSIS — J029 Acute pharyngitis, unspecified: Secondary | ICD-10-CM

## 2013-12-09 LAB — POCT CBC
Granulocyte percent: 70.7 %G (ref 37–80)
HCT, POC: 40.2 % (ref 37.7–47.9)
Hemoglobin: 12.6 g/dL (ref 12.2–16.2)
Lymph, poc: 1.4 (ref 0.6–3.4)
MCH, POC: 30.5 pg (ref 27–31.2)
MCHC: 31.3 g/dL — AB (ref 31.8–35.4)
MCV: 97.4 fL — AB (ref 80–97)
MID (cbc): 0.4 (ref 0–0.9)
MPV: 7 fL (ref 0–99.8)
POC Granulocyte: 4.4 (ref 2–6.9)
POC LYMPH PERCENT: 22.8 % (ref 10–50)
POC MID %: 6.5 %M (ref 0–12)
Platelet Count, POC: 224 10*3/uL (ref 142–424)
RBC: 4.13 M/uL (ref 4.04–5.48)
RDW, POC: 13.6 %
WBC: 6.2 10*3/uL (ref 4.6–10.2)

## 2013-12-09 LAB — POCT INFLUENZA A/B
Influenza A, POC: POSITIVE
Influenza B, POC: NEGATIVE

## 2013-12-09 LAB — POCT RAPID STREP A (OFFICE): Rapid Strep A Screen: NEGATIVE

## 2013-12-09 MED ORDER — BENZONATATE 100 MG PO CAPS: 200.0000 mg | ORAL_CAPSULE | Freq: Two times a day (BID) | ORAL | Status: DC | PRN

## 2013-12-09 MED ORDER — OSELTAMIVIR PHOSPHATE 75 MG PO CAPS: 75.0000 mg | ORAL_CAPSULE | Freq: Two times a day (BID) | ORAL | Status: DC

## 2013-12-09 NOTE — Progress Notes (Signed)
Chief Complaint:  Chief Complaint  Patient presents with  . Cough    x 3 days  . Headache  . Generalized Body Aches  . Sore Throat    HPI: Jessica Zavala is a 68 y.o. female who is here for :  three day history of headaches fever chills sore throat runny nose and yellow mucus when she coughs, husband has been sick for a while No ear pain no nausea no diarrhea no vomiting but has had some abd pain Frontal Ha with cough, she has yellow sputum production UTD on flu vaccine, is exposed to children ages 70 and 11. They are not sick, She does not want to get them sick. Has not tried anything except tylenol x 1   PCP Dr Virgina Jock   History reviewed. No pertinent past medical history. Past Surgical History  Procedure Laterality Date  . Breast surgery     History   Social History  . Marital Status: Married    Spouse Name: N/A    Number of Children: N/A  . Years of Education: N/A   Social History Main Topics  . Smoking status: Never Smoker   . Smokeless tobacco: None  . Alcohol Use: No  . Drug Use: None  . Sexual Activity: None   Other Topics Concern  . None   Social History Narrative  . None   History reviewed. No pertinent family history. Allergies  Allergen Reactions  . Aspirin Swelling    Mouth and facial swelling   Prior to Admission medications   Medication Sig Start Date End Date Taking? Authorizing Provider  labetalol (NORMODYNE) 300 MG tablet Take 200 mg by mouth 2 (two) times daily.   Yes Historical Provider, MD  olmesartan (BENICAR) 40 MG tablet Take 40 mg by mouth daily.   Yes Historical Provider, MD  augmented betamethasone dipropionate (DIPROLENE-AF) 0.05 % cream APPLY TOPICALLY TO THE AFFECTED AREA TWICE DAILY 05/02/13   Mancel Bale, PA-C  clobetasol ointment (TEMOVATE) 1.70 % Apply 1 application topically 2 (two) times daily. Apply a small amount to hands and arms once or twice daily as needed for eczema 11/15/13   Posey Boyer, MD  simvastatin  (ZOCOR) 20 MG tablet Take 20 mg by mouth every evening.    Historical Provider, MD  triamcinolone cream (KENALOG) 0.5 % APPLY TOPICALLY TWICE DAILY 02/19/13   Mancel Bale, PA-C     ROS: The patient denies fevers, chills, night sweats, unintentional weight loss, chest pain, palpitations, wheezing, dyspnea on exertion, nausea, vomiting, abdominal pain, dysuria, hematuria, melena, numbness, weakness, or tingling.  All other systems have been reviewed and were otherwise negative with the exception of those mentioned in the HPI and as above.    PHYSICAL EXAM: Filed Vitals:   12/09/13 1221  BP: 110/80  Pulse: 78  Temp: 99.4 F (37.4 C)  Resp: 16   Filed Vitals:   12/09/13 1221  Height: 4\' 10"  (1.473 m)  Weight: 125 lb (56.7 kg)   Body mass index is 26.13 kg/(m^2).  General: Alert, no acute distress HEENT:  Normocephalic, atraumatic, oropharynx patent. EOMI, PERRLA, no exudates, TM nl Cardiovascular:  Regular rate and rhythm, no rubs murmurs or gallops.  No Carotid bruits, radial pulse intact. No pedal edema.  Respiratory: Clear to auscultation bilaterally.  No wheezes, rales, or rhonchi.  No cyanosis, no use of accessory musculature GI: No organomegaly, abdomen is soft and non-tender, positive bowel sounds.  No masses. Skin: No  rashes. Neurologic: Facial musculature symmetric. Psychiatric: Patient is appropriate throughout our interaction. Lymphatic: No cervical lymphadenopathy Musculoskeletal: Gait intact.   LABS: Results for orders placed in visit on 12/09/13  POCT CBC      Result Value Range   WBC 6.2  4.6 - 10.2 K/uL   Lymph, poc 1.4  0.6 - 3.4   POC LYMPH PERCENT 22.8  10 - 50 %L   MID (cbc) 0.4  0 - 0.9   POC MID % 6.5  0 - 12 %M   POC Granulocyte 4.4  2 - 6.9   Granulocyte percent 70.7  37 - 80 %G   RBC 4.13  4.04 - 5.48 M/uL   Hemoglobin 12.6  12.2 - 16.2 g/dL   HCT, POC 40.2  37.7 - 47.9 %   MCV 97.4 (*) 80 - 97 fL   MCH, POC 30.5  27 - 31.2 pg   MCHC 31.3 (*)  31.8 - 35.4 g/dL   RDW, POC 13.6     Platelet Count, POC 224  142 - 424 K/uL   MPV 7.0  0 - 99.8 fL  POCT INFLUENZA A/B      Result Value Range   Influenza A, POC Positive     Influenza B, POC Negative    POCT RAPID STREP A (OFFICE)      Result Value Range   Rapid Strep A Screen Negative  Negative     EKG/XRAY:   Primary read interpreted by Dr. Marin Comment at [facility].   ASSESSMENT/PLAN: Encounter Diagnoses  Name Primary?  . Flu-like symptoms   . Acute pharyngitis   . Influenza due to identified novel influenza A virus with other respiratory manifestations Yes  . Cough    68 y/o [place] female with Influenza A:  Rx Tamiflu, tessalon perles She is supposed to get fasting blood work done this week at Dr [employer] office, I am not sure what he is planning to do but will notify his office based on patient;s request that she will probably come in at a later date.  Work note given F/u prn  Gross sideeffects, risk and benefits, and alternatives of medications d/w patient. Patient is aware that all medications have potential sideeffects and we are unable to predict every sideeffect or drug-drug interaction that may occur.  LE, [place], DO 12/09/2013 2:43 PM

## 2013-12-09 NOTE — Patient Instructions (Signed)
Cm A (H1N1) (Influenza A [H1N1]) H1N1 tr??c ?y ???c g?i l "cm l?n" l m?t vi rt cm m?i gy b?nh ? ng??i. Vi rt H1N1 khc v?i cc vi rt cm ma. Tuy nhin, cc tri?u ch?ng c?a H1N1 t??ng t? nh? cm ma v n ly lan t? ng??i ny sang ng??i khc. B?n c th? c nguy c? b? nh?ng v?n ?? nghim tr?ng cao h?n n?u b?n c nh?ng tnh tr?ng b?nh n?i khoa nghim tr?ng. CDC v T? Ch?c Y T? Th? Gi?i ?ang theo di cc tr??ng h?p ???c bo co trn kh?p th? gi?i.  NGUYN NHN  Cm ???c xem l ly lan ch? y?u t? ng??i sang ng??i thng qua ho ho?c h?t h?i c?a ng??i b? nhi?m b?nh.  M?t ng??i c th? b? nhi?m b?nh khi ch?m vo th? g ? c vi rt v sau ? ch?m vo mi?ng ho?c m?i h?. TRI?U CH?NG  S?t.  ?au ??u.  M?t m?i.  Ho.  ?au h?ng.  Ch?y n??c m?i ho?c ng?t m?i.  ?au nh?c ton thn.  Tiu ch?y v nn m?a Nh?ng tri?u ch?ng ny ???c ?? c?p l nh?ng tri?u ch?ng 'gi?ng nh? cm'. Nhi?u b?nh l khc nhau bao g?m, c?m l?nh, c th? c nh?ng tri?u ch?ng t??ng t?. CH?N ?ON  C nh?ng xt nghi?m c th? cho bi?t b?n b? nhi?m vi rt H1N1 hay khng.  Nh?ng tr??ng h?p ???c xc ??nh nhi?m H1N1 s? ???c bo co cho phng y t? c?a ti?u bang ho?c ??a ph??ng.  Bc s? c th? c?n khm ?? xc ??nh xem b?n c b? nhi?m trng do bi?n ch?ng c?a cm hay khng. H??NG D?N CH?M [place] T?I NH  Lun c?p nh?t thng tin. Truy c?p vo trang web c?a CDC ?? bi?t nh?ng khuy?n ngh? m?i nh?t. Truy c?p vo [email or web address]. B?n c?ng c th? g?i ??n 1-800-CDC-INFO [phone]).  Yu c?u tr? gip s?m n?u b?n b? b?t k? tri?u ch?ng no ? trn.  N?u b?n c nguy c? cao b? cc bi?n ch?ng c?a cm, hy ni chuy?n v?i chuyn gia ch?m [place] s?c kh?e c?a b?n ngay sau khi b?n c nh?ng tri?u ch?ng gi?ng nh? cm. Nh?ng ng??i b? nguy c? b? bi?n ch?ng cao h?n bao g?m:  Nh?ng ng??i t? 63 tu?i tr? ln.  Nh?ng ng??i c nh?ng tnh tr?ng b?nh n?i khoa m?n tnh.  Ph? n? mang thai.  Tr? nh?.  Chuyn gia ch?m [place] s?c kh?e c th? khuy?n ngh?  dng thu?c khng vi rt ?? gip ?i?u tr? cm.  N?u b?n b? cm, hy ngh? ng?i th?t nhi?u, u?ng ?? n??c v dung d?ch ?? gi? cho n??c ti?u trong ho?c vng nh?t v trnh s? d?ng r??u ho?c thu?c l.  B?n c th? s? d?ng thu?ckhng c?n k ??n ?? lm gi?m tri?u ch?ng cm n?u chuyn gia ch?m [place] s?c kh?e ch?p thu?n. (Khng bao gi? s? d?ng aspirin cho tr? em ho?c tr? v? thnh nin c nh?ng tri?u ch?ng gi?ng nh? cm, ??c bi?t khi b? s?t). ?I?U TR? N?u b? b?nh, b?n c th? s? d?ng thu?c khng vi rt. Nh?ng thu?c ny c th? gi?m nh? b?nh c?a b?n v lm cho b?n c?m th?y kh?e nhanh h?n. Nn ?i?u tr? ngay sau khi b?t ??u b? b?nh. N ch? c hi?u qu? n?u ???c s? d?ng trong ngy ??u tin khi b?t ??u b? b?nh. Ch? chuyn gia ch?m [place] s?c kh?e c?a b?n m?i c th? k ??n thu?c khng vi rt. [place]  NG?A  Che m?i v mi?ng b?ng kh?n gi?y ho?c cnh tay khi ho ho?c h?t h?i. V?t b? kh?n gi?y.  R?a tay th??ng xuyn b?ng x phng v n??c ?m, ??c bi?t sau khi ho ho?c h?t h?i. Nh?ng ch?t t?y r?a c ch?a c?n c?ng hi?u qu? trong vi?c ch?ng l?i vi trng.  Trnh ch?m vo m?t, m?i ho?c mi?ng. ?y l m?t cch ly lan vi trng.  Trnh ti?p xc v?i ng??i b? b?nh. Tun th? khuy?n co y t? cng c?ng lin quan ??n vi?c ?ng c?a tr??ng h?c. Trnh ?m ?ng.  ? nh n?u b?n b? b?nh. H?n ch? ti?p xc v?i ng??i khc ?? trnh ly b?nh cho h?. Nh?ng ng??i b? nhi?m vi rt H1N1 c th? ly nhi?m sang ng??i khc b?t c? lc no k? t? 1 ngy tr??c khi c?m th?y b? b?nh cho ??n 5-7 ngy sau khi xu?t hi?n tri?u ch?ng cm.  V?cxin H1N1 c s?n ?? gip b?o v? ch?ng l?i vi rt. Ngoi v?cxin H1N1, b?n s? c?n tim phng cm ma. V?cxin H1N1 v v?cxin cm ma c th? ???c cho dng trong cng ngy. CDC ??c bi?t khuyn ngh? s? d?ng v?cxin H1N1 cho:  Ph? n? mang [place].  Nh?ng ng??i s?ng cng v?i ho?c ch?m [place] tr? t? 6 thng tu?i tr? xu?ng.  Nhn vin d?ch v? ch?m [place] s?c kh?e v c?p c?u.  Nh?ng ng??i t? 6 thng tu?i ??n 24 tu?i.  Nh?ng ng??i t? 25 ??n 62 tu?i c  nguy c? b? nhi?m H1N1 cao h?n do cc r?i lo?n s?c kh?e m?n tnh ho?c v?n ?? v? h? mi?n d?ch. M?T N? Trong mi tr??ng ? c?ng ??ng v t?i nh, vi?c s? d?ng m?t n? v kh?u trang N95 th??ng khng ???c khuyn dng. Trong m?t s? tr??ng h?p nh?t ??nh, m?t n? ho?c kh?u trang N95 c th? ???c s? d?ng cho ng??i c t?ng nguy c? b? b?nh n?ng t? cm. Chuyn gia ch?m [place] s?c kh?e c?a b?n c th? t? v?n thm v? vi?c s? d?ng m?t n?. ? TR? EM, NH?NG D?U HI?U C?NH BO C?P C?U C?N ?I KHM KH?N C?P:  Th? nhanh ho?c kh th?.  Mu da h?i xanh.  Khng u?ng ?? n??c.  Khng th?c gi?c ho?c khng t??ng tc bnh th??ng.  Kh tnh t?i m?c tr? khng mu?n ???c m.  Nhi?t ?? ? mi?ng c?a con b?n trn 102 F (38,9 C), khng h? khi dng thu?c.  Con b?n trn 3 thng tu?i c nhi?t ?? ?o t?i tr?c trng l 102 F (38,9 C) tr? ln.  Con b?n t? 3 thng tu?i tr? xu?ng c nhi?t ?? ?o t?i tr?c trng l 100,4 F (38 C) tr? ln.  Tri?u ch?ng gi?ng nh? cm c?i thi?n nh?ng ti pht km theo s?t v ho t? h?n. ? NG??I L?N, NH?NG D?U HI?U C?NH BO C?P C?U C?N ?I KHM KH?N C?P:  Kh th? ho?c th? d?c.  ?au ho?c t?c ng?c ho?c b?ng.  Chng m?t ??t ng?t.  L l?n.  Nn n?ng ho?c lin t?c.  Da h?i Dorothy Puffer.  Nhi?t ?? mi?ng c?a b?n trn 102 F (38,9 C), khng h? khi dng thu?c.  Tri?u ch?ng gi?ng nh? cm c?i thi?n nh?ng ti pht km theo s?t v ho t? h?n. HY NGAY L?P T?C ?I KHM N?U: B?n ho?c ai ? b?n bi?t g?p ph?i b?t k? tri?u ch?ng no ? trn. Khi b?n ??n trung tm c?p c?u, hy bo r?ng b?n ngh? r?ng b?n  b? cm. B?n c th? ???c yu c?u ?eo m?t n? v/ho?c ng?i trong khu v?c cch ly ?? b?o v? nh?ng ng??i khc kh?i b? ly b?nh. ??M B?O B?N:  Hi?u cc h??ng d?n ny.  S? theo di tnh tr?ng c?a mnh.  S? yu c?u tr? gip ngay l?p t?c n?u b?n c?m th?y khng ?? ho?c tnh tr?ng tr?m tr?ng h?n. M?t s? thng tin ny ???c cung c?p mi?n ph b?i CDC. Document Released: 02/15/2010 Document Revised: 07/24/2013 [facility] Patient  Information 2014 [place], [place].

## 2013-12-09 NOTE — Telephone Encounter (Signed)
Patient saw Dr. Marin Comment today is calling because they have been at the pharmacy just now and the medications were not sent for tussalon and nasonex. Please advise. Thank you! They are at the pharmacy waiting.   [id number] - [place], [address] [place]

## 2013-12-09 NOTE — Telephone Encounter (Signed)
Pharmacy out of tamiflu, resent to [place]. Patients husband aware.

## 2014-03-10 ENCOUNTER — Other Ambulatory Visit: Payer: Self-pay

## 2014-03-10 DIAGNOSIS — Z1231 Encounter for screening mammogram for malignant neoplasm of breast: Secondary | ICD-10-CM

## 2014-03-24 ENCOUNTER — Other Ambulatory Visit: Payer: Self-pay | Admitting: Physician Assistant

## 2014-04-09 ENCOUNTER — Other Ambulatory Visit: Payer: Self-pay | Admitting: Internal Medicine

## 2014-04-09 DIAGNOSIS — N644 Mastodynia: Secondary | ICD-10-CM

## 2014-04-17 ENCOUNTER — Ambulatory Visit
Admission: RE | Admit: 2014-04-17 | Discharge: 2014-04-17 | Disposition: A | Discharge status: Home / Self Care | Payer: Commercial Managed Care - HMO | Source: Ambulatory Visit | Attending: Internal Medicine | Admitting: Internal Medicine

## 2014-04-17 DIAGNOSIS — N644 Mastodynia: Secondary | ICD-10-CM

## 2014-04-18 ENCOUNTER — Ambulatory Visit: Payer: Medicare Other

## 2014-04-21 ENCOUNTER — Ambulatory Visit: Payer: Medicare Other

## 2014-08-30 ENCOUNTER — Ambulatory Visit (INDEPENDENT_AMBULATORY_CARE_PROVIDER_SITE_OTHER): Payer: Commercial Managed Care - HMO

## 2014-08-30 ENCOUNTER — Ambulatory Visit (INDEPENDENT_AMBULATORY_CARE_PROVIDER_SITE_OTHER): Payer: Commercial Managed Care - HMO | Admitting: Family Medicine

## 2014-08-30 VITALS — BP 160/88 | HR 67 | Temp 97.7°F | Resp 16 | Ht <= 58 in | Wt 124.2 lb

## 2014-08-30 DIAGNOSIS — T148XXA Other injury of unspecified body region, initial encounter: Secondary | ICD-10-CM

## 2014-08-30 DIAGNOSIS — M546 Pain in thoracic spine: Secondary | ICD-10-CM

## 2014-08-30 DIAGNOSIS — Z853 Personal history of malignant neoplasm of breast: Secondary | ICD-10-CM

## 2014-08-30 DIAGNOSIS — L218 Other seborrheic dermatitis: Secondary | ICD-10-CM

## 2014-08-30 DIAGNOSIS — L219 Seborrheic dermatitis, unspecified: Secondary | ICD-10-CM

## 2014-08-30 DIAGNOSIS — N644 Mastodynia: Secondary | ICD-10-CM

## 2014-08-30 MED ORDER — KETOCONAZOLE 1 % EX SHAM: MEDICATED_SHAMPOO | CUTANEOUS | Status: DC

## 2014-08-30 NOTE — Progress Notes (Signed)
Chief Complaint:  Chief Complaint  Patient presents with  . Shoulder Pain    Right  . Scalp Itching    HPI: Jessica Zavala is a 68 y.o. female who is here for  Right shoulder pain at the traps radiating down to mid back, she  is worried her breast cancer has metasticize She has scapular pain intermittent better with tiger balm, she has more issues when laying down, NKI. She has not had this before. No CP or SOB.   She has scalp itching for last 3 months, does use hair dye but same brand for many years. No new foods, soaps, meds or travels.   History reviewed. No pertinent past medical history. Past Surgical History  Procedure Laterality Date  . Breast surgery     History   Social History  . Marital Status: Married    Spouse Name: N/A    Number of Children: N/A  . Years of Education: N/A   Social History Main Topics  . Smoking status: Never Smoker   . Smokeless tobacco: Never Used  . Alcohol Use: No  . Drug Use: No  . Sexual Activity: None   Other Topics Concern  . None   Social History Narrative  . None   History reviewed. No pertinent family history. Allergies  Allergen Reactions  . Aspirin Swelling    Mouth and facial swelling   Prior to Admission medications   Medication Sig Start Date End Date Taking? Authorizing Provider  labetalol (NORMODYNE) 300 MG tablet Take 200 mg by mouth 2 (two) times daily.   Yes Historical Provider, MD  olmesartan (BENICAR) 40 MG tablet Take 40 mg by mouth daily.   Yes Historical Provider, MD  simvastatin (ZOCOR) 20 MG tablet Take 20 mg by mouth every evening.   Yes Historical Provider, MD     ROS: The patient denies fevers, chills, night sweats, unintentional weight loss, chest pain, palpitations, wheezing, dyspnea on exertion, nausea, vomiting, abdominal pain, dysuria, hematuria, melena, numbness, weakness, or tingling.   All other systems have been reviewed and were otherwise negative with the exception of those  mentioned in the HPI and as above.    PHYSICAL EXAM: Filed Vitals:   08/30/14 1303  BP: 160/88  Pulse: 67  Temp: 97.7 F (36.5 C)  Resp: 16   Filed Vitals:   08/30/14 1303  Height: 4\' 10"  (1.473 m)  Weight: 124 lb 4 oz (56.359 kg)   Body mass index is 25.98 kg/(m^2).  General: Alert, no acute distress HEENT:  Normocephalic, atraumatic, oropharynx patent. EOMI, PERRLA Cardiovascular:  Regular rate and rhythm, no rubs murmurs or gallops.  No Carotid bruits, radial pulse intact. No pedal edema.  Respiratory: Clear to auscultation bilaterally.  No wheezes, rales, or rhonchi.  No cyanosis, no use of accessory musculature GI: No organomegaly, abdomen is soft and non-tender, positive bowel sounds.  No masses. Skin: + seb eczema of scalp hairline and eyebrows  Neurologic: Facial musculature symmetric. Psychiatric: Patient is appropriate throughout our interaction. Lymphatic: No cervical lymphadenopathy Musculoskeletal: Gait intact.   LABS: Results for orders placed in visit on 12/09/13  POCT CBC      Result Value Ref Range   WBC 6.2  4.6 - 10.2 K/uL   Lymph, poc 1.4  0.6 - 3.4   POC LYMPH PERCENT 22.8  10 - 50 %L   MID (cbc) 0.4  0 - 0.9   POC MID % 6.5  0 - 12 %M  POC Granulocyte 4.4  2 - 6.9   Granulocyte percent 70.7  37 - 80 %G   RBC 4.13  4.04 - 5.48 M/uL   Hemoglobin 12.6  12.2 - 16.2 g/dL   HCT, POC 40.2  37.7 - 47.9 %   MCV 97.4 (*) 80 - 97 fL   MCH, POC 30.5  27 - 31.2 pg   MCHC 31.3 (*) 31.8 - 35.4 g/dL   RDW, POC 13.6     Platelet Count, POC 224  142 - 424 K/uL   MPV 7.0  0 - 99.8 fL  POCT INFLUENZA A/B      Result Value Ref Range   Influenza A, POC Positive     Influenza B, POC Negative    POCT RAPID STREP A (OFFICE)      Result Value Ref Range   Rapid Strep A Screen Negative  Negative     EKG/XRAY:   Primary read interpreted by Dr. Marin Comment at [facility]. Neg for fracture or dislocation No acute cardiopulm process, h/o right breast  lumpectomy   ASSESSMENT/PLAN: Encounter Diagnoses  Name Primary?  . Right-sided thoracic back pain Yes  . Breast pain   . History of breast cancer   . Seborrheic eczema   . Sprain and strain    Rx Nizarol if head and shoulder or selsun blue  Or t gel does not work Patient was worried she had metastatic breast cancer due to pain her back and wanted a confirmatory chest xray and also back xray, she had a recent close friend with mets to the lungs.Chest x ray showed nocute cardiopulmonary process Advise to take tylenol prn for pain She would like me to ask Dr Virgina Jock if she got a flu vaccien the last time she was in their office, she thinks she either had flyu or zoster but doe snot know and would like to get a flu vaccine if she did not get one F.u prn  Gross sideeffects, risk and benefits, and alternatives of medications d/w patient. Patient is aware that all medications have potential sideeffects and we are unable to predict every sideeffect or drug-drug interaction that may occur.  Dayshawn Irizarry, [place], DO 08/30/2014 2:08 PM

## 2014-09-02 ENCOUNTER — Telehealth: Payer: Self-pay | Admitting: *Deleted

## 2014-09-02 NOTE — Telephone Encounter (Signed)
Dr. Keane Police called back and said that this pt has not been seen since July and has not had a flu shot yet.

## 2014-09-02 NOTE — Telephone Encounter (Signed)
Can you call her to let her knwo she needs a flu vaccine, she can go any place to get it but please ask them fax it to her PCP and to our office. IF she wants to get it here then direct her to [address]

## 2014-09-02 NOTE — Telephone Encounter (Signed)
Called Dr. Keane Police [phone]  Left message for Melissa to rtn call.

## 2014-09-02 NOTE — Telephone Encounter (Signed)
Message copied by Belva Chimes on Tue Sep 02, 2014  3:12 PM ------      Message from: Glenford Bayley      Created: Tue Sep 02, 2014  2:44 PM       Can you call DR Russo's office and see if she had the flu vaccine or zoster the last time she was there, she does not remember and would like to get a flu vaccine if hse had not gotten one for this year.             Thanks,      Dr Marin Comment ------

## 2014-09-03 NOTE — Telephone Encounter (Signed)
Lm for pt to RTC for flu shot at [facility] office.

## 2014-11-19 ENCOUNTER — Telehealth: Payer: Self-pay | Admitting: Family Medicine

## 2014-11-19 NOTE — Telephone Encounter (Signed)
Flu shot at dr. Celesta Aver on 09/18/14

## 2014-12-10 ENCOUNTER — Ambulatory Visit (INDEPENDENT_AMBULATORY_CARE_PROVIDER_SITE_OTHER): Payer: Commercial Managed Care - HMO

## 2014-12-10 ENCOUNTER — Ambulatory Visit (INDEPENDENT_AMBULATORY_CARE_PROVIDER_SITE_OTHER): Payer: Commercial Managed Care - HMO | Admitting: Family Medicine

## 2014-12-10 VITALS — BP 140/80 | HR 71 | Temp 97.9°F | Resp 16 | Ht <= 58 in | Wt 124.4 lb

## 2014-12-10 DIAGNOSIS — R0781 Pleurodynia: Secondary | ICD-10-CM | POA: Diagnosis not present

## 2014-12-10 DIAGNOSIS — R1011 Right upper quadrant pain: Secondary | ICD-10-CM

## 2014-12-10 DIAGNOSIS — M940 Chondrocostal junction syndrome [Tietze]: Secondary | ICD-10-CM

## 2014-12-10 LAB — POCT CBC
GRANULOCYTE PERCENT: 54.6 % (ref 37–80)
HEMATOCRIT: 39.2 % (ref 37.7–47.9)
HEMOGLOBIN: 13.4 g/dL (ref 12.2–16.2)
Lymph, poc: 2.2 (ref 0.6–3.4)
MCH, POC: 31.6 pg — AB (ref 27–31.2)
MCHC: 34.1 g/dL (ref 31.8–35.4)
MCV: 92.7 fL (ref 80–97)
MID (cbc): 0.3 (ref 0–0.9)
MPV: 6.5 fL (ref 0–99.8)
PLATELET COUNT, POC: 307 10*3/uL (ref 142–424)
POC Granulocyte: 2.9 (ref 2–6.9)
POC LYMPH PERCENT: 40 %L (ref 10–50)
POC MID %: 5.4 %M (ref 0–12)
RBC: 4.23 M/uL (ref 4.04–5.48)
RDW, POC: 13.1 %
WBC: 5.4 10*3/uL (ref 4.6–10.2)

## 2014-12-10 LAB — COMPREHENSIVE METABOLIC PANEL
ALT: 16 U/L (ref 0–35)
AST: 23 U/L (ref 0–37)
Albumin: 4.1 g/dL (ref 3.5–5.2)
Alkaline Phosphatase: 50 U/L (ref 39–117)
BUN: 12 mg/dL (ref 6–23)
CO2: 30 mEq/L (ref 19–32)
Calcium: 9.4 mg/dL (ref 8.4–10.5)
Chloride: 99 mEq/L (ref 96–112)
Creat: 0.54 mg/dL (ref 0.50–1.10)
GLUCOSE: 98 mg/dL (ref 70–99)
Potassium: 5 mEq/L (ref 3.5–5.3)
Sodium: 136 mEq/L (ref 135–145)
TOTAL PROTEIN: 7.5 g/dL (ref 6.0–8.3)
Total Bilirubin: 0.7 mg/dL (ref 0.2–1.2)

## 2014-12-10 MED ORDER — PREDNISONE 20 MG PO TABS
ORAL_TABLET | ORAL | Status: DC
Start: 2014-12-10 — End: 2015-02-07

## 2014-12-10 NOTE — Patient Instructions (Addendum)
Take prednisone 3 pills daily for 2 days, then 2 daily for 2 days, then 1 daily for 2 days for inflammation in ribs  Also take acetominophen (Tylenol) 2 tablets 3 times daily if needed for pain  The blood test for the liver and gallbladder is still pending, because it is done at another lab elsewhere. I will let you know in a few days the results of these tests.  If you are not improving by next week we will get an ultrasound of the liver and gallbladder  Return at anytime if worse   Vim s?n s??n (Costochondritis) Vim s?n s??n, ?i khi ???c g?i l h?i ch?ng [place], l tnh tr?ng s?ng v kch ?ng (vim) cc m (s?n) n?i x??ng s??n v?i x??ng ng?c (x??ng ?c). N gy ?au ? ng?c v khu v?c x??ng s??n. Vim s?n s??n th??ng t? kh?i sau m?t th?i gian. C th? m?t t?i 6 tu?n ho?c lu h?n ?? b?nh ?? h?n, ??c bi?t l n?u qu v? khng th? h?n ch? cc ho?t ??ng c?a mnh. NGUYN NHN  M?t s? tr??ng h?p vim s?n s??n khng r nguyn nhn. Nh?ng nguyn nhn c th? c bao g?m:  T?n th??ng (ch?n th??ng).  T?p th? d?c ho?c ho?t ??ng ch?ng h?n nh? nng nh?c.  Ho d? d?i. D?U HI?U V TRI?U CH?NG  ?au v c?m gic ?au ? ng?c v khu v?c x??ng s??n.  ?au n?ng h?n khi ho ho?c th? su.  ?au n?ng h?n khi c cc c? ??ng ring bi?t. CH?N ?ON  Chuyn gia ch?m [place] s?c kh?e c?a qu v? s? khm th?c th? v h?i v? nh?ng tri?u ch?ng c?a qu v?. C th? ch?p X quang l?ng ng?c ho?c cc xt nghi?m khc ?? lo?i tr? nh?ng v?n ?? khc. ?I?U TR?  Vim s?n s??n th??ng t? kh?i sau m?t th?i gian. Chuyn gia ch?m [place] s?c kh?e c th? k ??n thu?c ?? gi?m ?au. H??NG D?N CH?M [place] T?I NH   Trnh ho?t ??ng th? l?c g?ng s?c. C? g?ng khng ko c?ng x??ng s??n trong khi ho?t ??ng bnh th??ng. ?i?u ny c th? bao g?m b?t k? ho?t ??ng no c s? d?ng c? ng?c, b?ng, v ? m?ng s??n, ??c bi?t l n?u s? d?ng cc v?t n?ng.  Ch??m ? vo vng b? ?nh h??ng trong 2 ngy ??u sau khi b?t ??u ?au.  Cho ? vo ti nh?a.  ?? kh?n t?m vo gi?a da  v ti.  ?? ? l?nh trong kho?ng 20 pht, 2 - 3 l?n m?t ngy.  Ch? s? d?ng thu?c khng c?n k ??n ho?c thu?c c?n k ??n theo ch? d?n c?a chuyn gia ch?m  s?c kh?e. ?I KHM N?U:  Qu v? b? t?y ?? ho?c s?ng ? cc kh?p x??ng s??n. ?y l nh?ng d?u hi?u nhi?m trng.  C?n ?au c?a qu v? khng h?t m?c d ? ngh? ng?i ho?c dng thu?c. NGAY L?P T?C ?I KHM N?U:   C?n ?au c?a qu v? t?ng ln ho?c c?m th?y r?t kh ch?u.  Qu v? b? th? d?c ho?c kh th?.  Qu v? ho ra mu.  Qu v? b? ?au ng?c n?ng h?n, ?? m? hi, ho?c nn m?a.  Qu v? b? s?t ho?c cc tri?u ch?ng ko di h?n 2 - 3 ngy.  Qu v? b? s?t v cc tri?u ch?ng c?a qu v? ??t nhin n?ng ln. ??M B?O QU V?:   Hi?u cc h??ng d?n ny.  S? theo di tnh tr?ng  c?a mnh.  S? yu c?u tr? gip ngay l?p t?c n?u b?n c?m th?y khng kh?e ho?c th?y tr?m tr?ng h?n. Document Released: 08/31/2005 Document Revised: 09/11/2013 [facility] Patient Information 2015 [place]. This information is not intended to replace advice given to you by your health care provider. Make sure you discuss any questions you have with your health care provider.

## 2014-12-10 NOTE — Progress Notes (Signed)
Subjective: For the past couple of weeks patient has been having problems in the right lower chest wall area right upper quadrant of the abdomen. It is a little hard to tell exactly where. Her husband is with her. He speaks pretty good [place], but her English is not very good. She is [place]. She has not had any nausea or vomiting. She just hurts. She does have a history of a right breast cancer about 20 years ago.  Objective: No rash. No axillary nodes. Chest clear. Heart regular without murmurs. Abdomen is soft without mass or tenderness except for a little tenderness right upper on the edge of the rib cage. She is tender along the anterior lower rib margin.  Assessment: Chest wall pain versus right upper quadrant pain, etiology undetermined  Plan: Labs and x-ray [facility] reading (PRIMARY) by  Dr. Linna Darner Normal ribs.  Staples from old breast surgery  Results for orders placed or performed in visit on 12/10/14  POCT CBC  Result Value Ref Range   WBC 5.4 4.6 - 10.2 K/uL   Lymph, poc 2.2 0.6 - 3.4   POC LYMPH PERCENT 40.0 10 - 50 %L   MID (cbc) 0.3 0 - 0.9   POC MID % 5.4 0 - 12 %M   POC Granulocyte 2.9 2 - 6.9   Granulocyte percent 54.6 37 - 80 %G   RBC 4.23 4.04 - 5.48 M/uL   Hemoglobin 13.4 12.2 - 16.2 g/dL   HCT, POC 39.2 37.7 - 47.9 %   MCV 92.7 80 - 97 fL   MCH, POC 31.6 (A) 27 - 31.2 pg   MCHC 34.1 31.8 - 35.4 g/dL   RDW, POC 13.1 %   Platelet Count, POC 307 142 - 424 K/uL   MPV 6.5 0 - 99.8 fL   . Assessment: Probable costochondritis. However if symptoms don't resolve will have to work her up more from gallbladder and liver possibilities.  She is allergic to aspirin sure could not use a anti-inflammatory medication, therefore with prednisone.

## 2014-12-12 ENCOUNTER — Encounter: Payer: Self-pay | Admitting: *Deleted

## 2014-12-23 ENCOUNTER — Ambulatory Visit (INDEPENDENT_AMBULATORY_CARE_PROVIDER_SITE_OTHER): Payer: Commercial Managed Care - HMO | Admitting: Family Medicine

## 2014-12-23 ENCOUNTER — Ambulatory Visit (INDEPENDENT_AMBULATORY_CARE_PROVIDER_SITE_OTHER): Payer: Commercial Managed Care - HMO

## 2014-12-23 VITALS — BP 160/80 | HR 65 | Temp 98.1°F | Resp 16 | Ht <= 58 in | Wt 126.1 lb

## 2014-12-23 DIAGNOSIS — R319 Hematuria, unspecified: Secondary | ICD-10-CM

## 2014-12-23 DIAGNOSIS — R300 Dysuria: Secondary | ICD-10-CM

## 2014-12-23 DIAGNOSIS — R1011 Right upper quadrant pain: Secondary | ICD-10-CM

## 2014-12-23 LAB — POCT URINALYSIS DIPSTICK
Bilirubin, UA: NEGATIVE
Glucose, UA: NEGATIVE
Ketones, UA: NEGATIVE
Leukocytes, UA: NEGATIVE
Nitrite, UA: NEGATIVE
Protein, UA: NEGATIVE
Spec Grav, UA: 1.01
Urobilinogen, UA: 0.2
pH, UA: 6

## 2014-12-23 LAB — POCT UA - MICROSCOPIC ONLY
Bacteria, U Microscopic: NEGATIVE
Casts, Ur, LPF, POC: NEGATIVE
Crystals, Ur, HPF, POC: NEGATIVE
Epithelial cells, urine per micros: NEGATIVE
Mucus, UA: NEGATIVE
RBC, urine, microscopic: NEGATIVE
WBC, Ur, HPF, POC: NEGATIVE
Yeast, UA: NEGATIVE

## 2014-12-23 LAB — POCT CBC
Granulocyte percent: 52.5 %G (ref 37–80)
HCT, POC: 39.8 % (ref 37.7–47.9)
Hemoglobin: 13 g/dL (ref 12.2–16.2)
Lymph, poc: 2.7 (ref 0.6–3.4)
MCH, POC: 30.9 pg (ref 27–31.2)
MCHC: 32.8 g/dL (ref 31.8–35.4)
MCV: 94.2 fL (ref 80–97)
MID (cbc): 0.5 (ref 0–0.9)
MPV: 5.7 fL (ref 0–99.8)
POC Granulocyte: 3.5 (ref 2–6.9)
POC LYMPH PERCENT: 40 %L (ref 10–50)
POC MID %: 7.5 % (ref 0–12)
Platelet Count, POC: 334 10*3/uL (ref 142–424)
RBC: 4.22 M/uL (ref 4.04–5.48)
RDW, POC: 13 %
WBC: 6.7 10*3/uL (ref 4.6–10.2)

## 2014-12-23 NOTE — Patient Instructions (Signed)
?au Ni?u Qu?n (S?i Th?n) (Ureteral Colic [Kidney Stones]) ?au ni?u qu?n l k?t qu? c?a tnh tr?ng s?i th?n hnh thnh bn trong th?n. Sau khi s?i th?n ???c hnh thnh, chng c th? di chuy?n vo ?ng k?t n?i th?n v?i bng quang (ni?u qu?n). N?u ?i?u ny x?y ra, tnh tr?ng ny c th? gy ?au (?au b?ng) trong ni?u qu?n.  NGUYN NHN C?n ?au do s? di chuy?n c?a s?i trong ni?u qu?n v s? t?c ngh?n do s?i. TRI?U CH?NG C?n ?au ??n v ?i khi ni?u qu?n co th?t xung quanh s?i. C?n ?au th??ng d? d?i, ?au th?t v nh? b? ?m. V? tr b? ?au c th? di chuy?n khi s?i di chuy?n qua ni?u qu?n. Khi s?i ? g?n th?n, c?n ?au th??ng xu?t hi?n ? l?ng v t?a ra b?ng. Khi s?i ? s?n sng ?i vo bng quang, c?n ?au th??ng xu?t hi?n ? vng b?ng d??i ? bn c s?i. T?i v? tr ny, cc tri?u ch?ng c th? t??ng t? cc tri?u ch?ng c?a nhi?m trng ???ng ti?t ni?u v?i s? l?n ti?u nhi?u. Sau khi s?i chuy?n ??n ?y n th??ng chuy?n vo bng quang v bi?n m?t hon ton. ?I?U TR?  Chuyn gia ch?m [place] s?c kh?e c?a b?n s? cung c?p cho b?n thu?c gi?m ?au.  B?n c th? yu c?u ch?p X-quang theo di [employer].  Khng b? ?au khng lun c ngh?a l s?i ? thot ra. N c th? ch? ng?ng di chuy?n. N?u n??c ti?u v?n cn hon ton b? t?c, n c th? gy ra m?t ch?c n?ng th?n ho?c th?m ch l ph h?y th?n c lin quan. B?n c trch nhi?m v n c?ng l l?i ch c?a b?n trong vi?c ch?p X-quang v khm l?i theo ?? xu?t c?a chuyn gia ch?m scs?c kh?e. Gi?m ?au m s?i khng thot ra ngoi c th? lin quan ??n h? h?i th?n nghim tr?ng, bao g?m c? m?t ch?c n?ng th?n ? bn ?.  N?u s?i khng t? thot ra ngoi, cc bi?n php b? sung c th? ???c th?c hi?n b?i chuyn gia ch?m [place] s?c kh?e c?a b?n ?? ??m b?o n ???c lo?i b?. H??NG D?N CH?M [place] T?I NH  U?ng nhi?u n??c h?n. N??c l ch?t l?ng ???c ?u tin v n??c tri cy ch?a vitamin C c th? gy axit ha n??c ti?u lm cho t c kh? n?ng ??y m?t s? s?i nh?t ??nh (s?i axit uric).  ?i ti?u cho ??n khi h?t s?ch  n??c ti?u. L?c s? ???c cung c?p. Gi? l?i t?t c? cc h?t ho?c s?i ?? chuyn gia ch?m [place] s?c kh?e c?a b?n ki?m tra.  Dng thu?c gi?m ?au theo ch? d?n.  G?p chuyn gia ch?m [place] s?c kh?e ?? khm l?i theo h??ng d?n.  Hy nh? r?ng m?c tiu l ?? s?i thot ra ngoi. C?n ?au khng cn khng c ngh?a l s?i ? tiu tan. Th?c hi?n theo h??ng d?n c?a chuyn gia ch?m [place] s?c kh?e.  Ch? s? d?ng thu?c khng c?n k toa ho?c thu?c c?n k toa ?? gi?m ?au, gi?m c?m gic kh ch?u ho?c h? s?t theo ch? d?n c?a chuyn gia ch?m  s?c kh?e c?a b?n. HY ?I KHM N?U:  ?au khng th? ki?m sot ???c b?ng thu?c ???c k ??n.  B?n b? s?t.  ?au ti?p t?c lu h?n so v?i nh?ng g bc s? t? v?n.  C?n ?au thay ??i v b?n b? c?n ?au ng?c ho?c ?au b?ng lin  t?c.  B?n c?m th?y mu?n ng?t ho?c ng?t. ??M B?O B?N:  Hi?u cc h??ng d?n ny.  S? theo di tnh tr?ng c?a mnh.  S? yu c?u tr? gip ngay l?p t?c n?u b?n c?m th?y khng ?? ho?c tnh tr?ng tr?m tr?ng h?n. Document Released: 08/03/2011 Document Revised: 07/24/2013 [facility] Patient Information 2015 [place]. This information is not intended to replace advice given to you by your health care provider. Make sure you discuss any questions you have with your health care provider.

## 2014-12-23 NOTE — Progress Notes (Signed)
Chief Complaint:  Chief Complaint  Patient presents with  . Dysuria    HPI: Jessica Zavala is a 68 y.o. female who is here for  1 week history of dysuria, no fevers or chills, or nausea. 3-4 days prior she had worsening pain, but now feels almost completely back to normal and better.  That day she had lots of spicy foods which may have triggered it but she ahs never had [roblems with uriantion and spicy foods before. She had 5-8 /10 pain with urination a few days ago but today she has minimal pain, she ahs no other sxs ie increase frequency, urgency, vaginal discharge , no blood in urine, no fevers, chill, no flank pain, she has normal back pain.   History reviewed. No pertinent past medical history. Past Surgical History  Procedure Laterality Date  . Breast surgery     History   Social History  . Marital Status: Married    Spouse Name: N/A    Number of Children: N/A  . Years of Education: N/A   Social History Main Topics  . Smoking status: Never Smoker   . Smokeless tobacco: Never Used  . Alcohol Use: No  . Drug Use: No  . Sexual Activity: None   Other Topics Concern  . None   Social History Narrative   History reviewed. No pertinent family history. Allergies  Allergen Reactions  . Aspirin Swelling    Mouth and facial swelling   Prior to Admission medications   Medication Sig Start Date End Date Taking? Authorizing Provider  KETOCONAZOLE, TOPICAL, 1 % SHAM Use 1 x per week, leave on face  and hair for 5 minutes and then rinse 08/30/14  Yes Thao P Le, DO  labetalol (NORMODYNE) 300 MG tablet Take 200 mg by mouth 2 (two) times daily.   Yes Historical Provider, MD  olmesartan (BENICAR) 40 MG tablet Take 40 mg by mouth daily.   Yes Historical Provider, MD  predniSONE (DELTASONE) 20 MG tablet Take  3 daily for 2 days, then 2 daily for 2 days, then 1 daily for 2 days 12/10/14  Yes Posey Boyer, MD  simvastatin (ZOCOR) 20 MG tablet Take 20 mg by mouth every evening.    Yes Historical Provider, MD     ROS: The patient denies fevers, chills, night sweats, unintentional weight loss, chest pain, palpitations, wheezing, dyspnea on exertion, nausea, vomiting, abdominal pain,  hematuria, melena, numbness, weakness, or tingling.   All other systems have been reviewed and were otherwise negative with the exception of those mentioned in the HPI and as above.    PHYSICAL EXAM: Filed Vitals:   12/23/14 1835  BP: 160/80  Pulse: 65  Temp: 98.1 F (36.7 C)  Resp: 16   Filed Vitals:   12/23/14 1835  Height: 4\' 10"  (1.473 m)  Weight: 126 lb 2 oz (57.21 kg)   Body mass index is 26.37 kg/(m^2).  General: Alert, no acute distress HEENT:  Normocephalic, atraumatic, oropharynx patent. EOMI, PERRLA Cardiovascular:  Regular rate and rhythm, no rubs murmurs or gallops.  Radial pulse intact. No pedal edema.  Respiratory: Clear to auscultation bilaterally.  No wheezes, rales, or rhonchi.  No cyanosis, no use of accessory musculature GI: No organomegaly, abdomen is soft and non-tender, positive bowel sounds.  No masses. Skin: No rashes. Neurologic: Facial musculature symmetric. Psychiatric: Patient is appropriate throughout our interaction. Lymphatic: No cervical lymphadenopathy Musculoskeletal: Gait intact. No CVA tenderness  LABS: Results for orders  placed or performed in visit on 12/23/14  POCT urinalysis dipstick  Result Value Ref Range   Color, UA YELLOW    Clarity, UA CLEAR    Glucose, UA NEG    Bilirubin, UA NEG    Ketones, UA NEG    Spec Grav, UA 1.010    Blood, UA SMALL    pH, UA 6.0    Protein, UA NEG    Urobilinogen, UA 0.2    Nitrite, UA NEG    Leukocytes, UA Negative   POCT UA - Microscopic Only  Result Value Ref Range   WBC, Ur, HPF, POC NEG    RBC, urine, microscopic NEG    Bacteria, U Microscopic NEG    Mucus, UA NEG    Epithelial cells, urine per micros NEG    Crystals, Ur, HPF, POC NEG    Casts, Ur, LPF, POC NEG    Yeast, UA NEG     POCT CBC  Result Value Ref Range   WBC 6.7 4.6 - 10.2 K/uL   Lymph, poc 2.7 0.6 - 3.4   POC LYMPH PERCENT 40.0 10 - 50 %L   MID (cbc) 0.5 0 - 0.9   POC MID % 7.5 0 - 12 %M   POC Granulocyte 3.5 2 - 6.9   Granulocyte percent 52.5 37 - 80 %G   RBC 4.22 4.04 - 5.48 M/uL   Hemoglobin 13.0 12.2 - 16.2 g/dL   HCT, POC 39.8 37.7 - 47.9 %   MCV 94.2 80 - 97 fL   MCH, POC 30.9 27 - 31.2 pg   MCHC 32.8 31.8 - 35.4 g/dL   RDW, POC 13.0 %   Platelet Count, POC 334 142 - 424 K/uL   MPV 5.7 0 - 99.8 fL     EKG/XRAY:   Primary read interpreted by Dr. Marin Comment at [facility]. No obvious renal stones ? Phleboliths vs ureteral stones   ASSESSMENT/PLAN: Encounter Diagnoses  Name Primary?  . Dysuria   . Abdominal pain, right upper quadrant   . Hematuria Yes   LIkely had renal stone and now resolved and has passed, feels better.  I do not see any renal or ureteral stones on xray UA neg for infection but she does have slight hematuria, advise to return if she has worsening sxs,  F/u  For repeat urine in 1 month.   Gross sideeffects, risk and benefits, and alternatives of medications d/w patient. Patient is aware that all medications have potential sideeffects and we are unable to predict every sideeffect or drug-drug interaction that may occur.  LE, [place], DO 12/23/2014 7:53 PM

## 2014-12-24 LAB — COMPLETE METABOLIC PANEL WITH GFR
ALT: 17 U/L (ref 0–35)
AST: 22 U/L (ref 0–37)
Alkaline Phosphatase: 58 U/L (ref 39–117)
Creat: 0.68 mg/dL (ref 0.50–1.10)
GFR, Est African American: >89 mL/min
Sodium: 135 mEq/L (ref 135–145)
Total Bilirubin: 0.5 mg/dL (ref 0.2–1.2)
Total Protein: 7.5 g/dL (ref 6.0–8.3)

## 2014-12-24 LAB — COMPLETE METABOLIC PANEL WITHOUT GFR
Albumin: 4.3 g/dL (ref 3.5–5.2)
BUN: 13 mg/dL (ref 6–23)
CO2: 32 meq/L (ref 19–32)
Calcium: 9.5 mg/dL (ref 8.4–10.5)
Chloride: 97 meq/L (ref 96–112)
GFR, Est Non African American: 89 mL/min
Glucose, Bld: 91 mg/dL (ref 70–99)
Potassium: 5.1 meq/L (ref 3.5–5.3)

## 2015-01-09 DIAGNOSIS — E785 Hyperlipidemia, unspecified: Secondary | ICD-10-CM | POA: Diagnosis not present

## 2015-01-09 DIAGNOSIS — Z008 Encounter for other general examination: Secondary | ICD-10-CM | POA: Diagnosis not present

## 2015-01-09 DIAGNOSIS — I10 Essential (primary) hypertension: Secondary | ICD-10-CM | POA: Diagnosis not present

## 2015-01-23 DIAGNOSIS — C50919 Malignant neoplasm of unspecified site of unspecified female breast: Secondary | ICD-10-CM | POA: Diagnosis not present

## 2015-01-23 DIAGNOSIS — I10 Essential (primary) hypertension: Secondary | ICD-10-CM | POA: Diagnosis not present

## 2015-01-23 DIAGNOSIS — N644 Mastodynia: Secondary | ICD-10-CM | POA: Diagnosis not present

## 2015-01-23 DIAGNOSIS — R1011 Right upper quadrant pain: Secondary | ICD-10-CM | POA: Diagnosis not present

## 2015-01-23 DIAGNOSIS — M1990 Unspecified osteoarthritis, unspecified site: Secondary | ICD-10-CM | POA: Diagnosis not present

## 2015-01-23 DIAGNOSIS — R946 Abnormal results of thyroid function studies: Secondary | ICD-10-CM | POA: Diagnosis not present

## 2015-01-23 DIAGNOSIS — R312 Other microscopic hematuria: Secondary | ICD-10-CM | POA: Diagnosis not present

## 2015-01-23 DIAGNOSIS — J309 Allergic rhinitis, unspecified: Secondary | ICD-10-CM | POA: Diagnosis not present

## 2015-01-23 DIAGNOSIS — K5730 Diverticulosis of large intestine without perforation or abscess without bleeding: Secondary | ICD-10-CM | POA: Diagnosis not present

## 2015-01-29 DIAGNOSIS — R1011 Right upper quadrant pain: Secondary | ICD-10-CM | POA: Diagnosis not present

## 2015-02-06 ENCOUNTER — Other Ambulatory Visit: Payer: Self-pay

## 2015-02-06 DIAGNOSIS — L309 Dermatitis, unspecified: Secondary | ICD-10-CM

## 2015-02-06 NOTE — Telephone Encounter (Signed)
Dr Marin Comment, you have seen pt last month for other issues and in past for seb eczema. It looks like these were Rxd for pt in the past for eczema on hand. Do you want to RF these for pt, or does pt needs re-eval first?

## 2015-02-07 ENCOUNTER — Ambulatory Visit (INDEPENDENT_AMBULATORY_CARE_PROVIDER_SITE_OTHER): Payer: Commercial Managed Care - HMO | Admitting: Family Medicine

## 2015-02-07 VITALS — BP 110/70 | HR 64 | Temp 97.4°F | Ht <= 58 in | Wt 121.5 lb

## 2015-02-07 DIAGNOSIS — L299 Pruritus, unspecified: Secondary | ICD-10-CM | POA: Diagnosis not present

## 2015-02-07 DIAGNOSIS — L309 Dermatitis, unspecified: Secondary | ICD-10-CM

## 2015-02-07 MED ORDER — CLOBETASOL PROPIONATE 0.05 % EX OINT: 1.0000 "application " | TOPICAL_OINTMENT | Freq: Two times a day (BID) | CUTANEOUS | Status: DC

## 2015-02-07 MED ORDER — METHYLPREDNISOLONE ACETATE 80 MG/ML IJ SUSP
80.0000 mg | Freq: Once | INTRAMUSCULAR | Status: AC
  Administered 2015-02-07: 80 mg via INTRAMUSCULAR

## 2015-02-07 MED ORDER — HYDROXYZINE HCL 25 MG PO TABS: 12.5000 mg | ORAL_TABLET | Freq: Three times a day (TID) | ORAL | Status: DC | PRN

## 2015-02-07 MED ORDER — TRIAMCINOLONE ACETONIDE 0.5 % EX CREA: 1.0000 "application " | TOPICAL_CREAM | Freq: Two times a day (BID) | CUTANEOUS | Status: DC

## 2015-02-07 NOTE — Patient Instructions (Addendum)
Take your baths less frequently in the wintertime.  When the air is dry you lose too much or oil from your skin when you are bathing. In the summertime it is okay to bathe more frequently. Don't soak is a good choice of soaks because it has some oil and motion in it.  Use the triamcinolone cream for up to 4 or 5 days on the rash on the face. Then you should discontinue it.  If you continue to have rash on the face you can use some over-the-counter hydrocortisone cream. When the rash goes away discontinue using the medication  Use the clobetasol on the rash on the hands, neck, back, and abdomen.  Take hydroxyzine when needed for bad itching. It will make you feel a little bit sleepy when you take it. Probably best to try one half pill initially.  Return if not improving.

## 2015-02-07 NOTE — Progress Notes (Signed)
Subjective: 69 year old lady previously known to me who's been having a lot of trouble with itching. She has a rash on her hands and lower parts of her wrist, some on her trunk, some on her scalp and right cheek. She is out of the cortisone creams that she had. She likes the clobetasol cream. Last year we gave her shot of Depo-Medrol which has helped a lot. Does not work outdoors. Stays busy indoors doing a lot of sewing. She doesn't eat any exotic foods that might cause a rash.  Objective: Eczematoid rash on right cheek and top of her scalp. So your back of the neck, her back, and right abdominal wall below the breast. None on the lower extremities. The stuff on the back of the hands almost had a scabietic appearance but there is none between the fingers, and the areas looked larger and more excoriated then most scabies places. The distribution elsewhere on body is not consistent with scabies.  Assessment: Eczematoid dermatitis  Plan: Clobetasol for the non-facial areas. Did give her a little triamcinolone that she can use on that right cheek again, but only for a few days. Told her to use hydrocortisone cream if she needs something longer term. Gave her some hydroxyzine for the itch itching, and a shot of Depo-Medrol 80.

## 2015-03-16 ENCOUNTER — Other Ambulatory Visit: Payer: Self-pay

## 2015-03-16 DIAGNOSIS — Z1231 Encounter for screening mammogram for malignant neoplasm of breast: Secondary | ICD-10-CM

## 2015-03-25 ENCOUNTER — Ambulatory Visit (INDEPENDENT_AMBULATORY_CARE_PROVIDER_SITE_OTHER): Payer: Commercial Managed Care - HMO | Admitting: Family Medicine

## 2015-03-25 VITALS — BP 120/68 | HR 71 | Temp 98.0°F | Resp 16 | Ht <= 58 in | Wt 123.0 lb

## 2015-03-25 DIAGNOSIS — R040 Epistaxis: Secondary | ICD-10-CM

## 2015-03-25 DIAGNOSIS — J069 Acute upper respiratory infection, unspecified: Secondary | ICD-10-CM

## 2015-03-25 DIAGNOSIS — J329 Chronic sinusitis, unspecified: Secondary | ICD-10-CM | POA: Diagnosis not present

## 2015-03-25 DIAGNOSIS — M791 Myalgia, unspecified site: Secondary | ICD-10-CM

## 2015-03-25 DIAGNOSIS — J310 Chronic rhinitis: Secondary | ICD-10-CM

## 2015-03-25 MED ORDER — AMOXICILLIN 875 MG PO TABS: 875.0000 mg | ORAL_TABLET | Freq: Two times a day (BID) | ORAL | Status: DC

## 2015-03-25 NOTE — Progress Notes (Signed)
Subjective: Pleasant 69 year old lady who is here because she's been feeling bad. She has had congestion. She has some blood from her nose. She has body aches. She has not had a lot of documented fever. She is not coughing much. She is blowing yellow mucus from her nose. She has a headache. She does not smoke. This been going on for over a week. She had flown prior to that.  Objective: Pleasant alert lady. Her TMs are normal. The right side of the nose has a lot of blood in it. Throat clear. Neck supple without significant nodes. Chest is clear to auscultation. Heart regular without murmurs gallops or arrhythmias.  Assessment: Rhinosinusitis with epistaxis Body aches  Plan: Amoxicillin 875 twice daily Polysporin ointment in nose  No laboratory testing needed today

## 2015-03-25 NOTE — Patient Instructions (Signed)
Drink plenty of fluids to stay well hydrated and get lots of sleep.  Take amoxicillin one twice daily  Get some Polysporin ointment and apply a small amount in your nose twice daily as directed for a week or so to calm down the area that is inflamed and bleeding  Tylenol (acetaminophen) 1000 mg (2500 mg) 3 times daily for the aching  Return if worse or not improving

## 2015-04-24 ENCOUNTER — Ambulatory Visit
Admission: RE | Admit: 2015-04-24 | Discharge: 2015-04-24 | Disposition: A | Discharge status: Home / Self Care | Payer: Commercial Managed Care - HMO | Source: Ambulatory Visit

## 2015-04-24 DIAGNOSIS — Z1231 Encounter for screening mammogram for malignant neoplasm of breast: Secondary | ICD-10-CM | POA: Diagnosis not present

## 2015-06-15 DIAGNOSIS — Z6825 Body mass index (BMI) 25.0-25.9, adult: Secondary | ICD-10-CM | POA: Diagnosis not present

## 2015-06-15 DIAGNOSIS — M47812 Spondylosis without myelopathy or radiculopathy, cervical region: Secondary | ICD-10-CM | POA: Diagnosis not present

## 2015-06-15 DIAGNOSIS — E785 Hyperlipidemia, unspecified: Secondary | ICD-10-CM | POA: Diagnosis not present

## 2015-06-15 DIAGNOSIS — M47813 Spondylosis without myelopathy or radiculopathy, cervicothoracic region: Secondary | ICD-10-CM | POA: Diagnosis not present

## 2015-06-15 DIAGNOSIS — R0600 Dyspnea, unspecified: Secondary | ICD-10-CM | POA: Diagnosis not present

## 2015-06-15 DIAGNOSIS — I700 Atherosclerosis of aorta: Secondary | ICD-10-CM | POA: Diagnosis not present

## 2015-06-15 DIAGNOSIS — M542 Cervicalgia: Secondary | ICD-10-CM | POA: Diagnosis not present

## 2015-06-15 DIAGNOSIS — L409 Psoriasis, unspecified: Secondary | ICD-10-CM | POA: Diagnosis not present

## 2015-06-15 DIAGNOSIS — I10 Essential (primary) hypertension: Secondary | ICD-10-CM | POA: Diagnosis not present

## 2015-06-15 DIAGNOSIS — R1011 Right upper quadrant pain: Secondary | ICD-10-CM | POA: Diagnosis not present

## 2015-09-03 DIAGNOSIS — Z01419 Encounter for gynecological examination (general) (routine) without abnormal findings: Secondary | ICD-10-CM | POA: Diagnosis not present

## 2015-09-03 DIAGNOSIS — Z6824 Body mass index (BMI) 24.0-24.9, adult: Secondary | ICD-10-CM | POA: Diagnosis not present

## 2015-10-08 ENCOUNTER — Ambulatory Visit (INDEPENDENT_AMBULATORY_CARE_PROVIDER_SITE_OTHER): Payer: Commercial Managed Care - HMO | Admitting: Family Medicine

## 2015-10-08 VITALS — BP 118/72 | HR 71 | Temp 97.3°F | Resp 16 | Ht <= 58 in | Wt 117.0 lb

## 2015-10-08 DIAGNOSIS — M940 Chondrocostal junction syndrome [Tietze]: Secondary | ICD-10-CM

## 2015-10-08 DIAGNOSIS — I10 Essential (primary) hypertension: Secondary | ICD-10-CM | POA: Diagnosis not present

## 2015-10-08 DIAGNOSIS — E785 Hyperlipidemia, unspecified: Secondary | ICD-10-CM

## 2015-10-08 DIAGNOSIS — R1011 Right upper quadrant pain: Secondary | ICD-10-CM

## 2015-10-08 LAB — COMPLETE METABOLIC PANEL WITHOUT GFR
ALT: 10 U/L (ref 6–29)
AST: 20 U/L (ref 10–35)
BUN: 12 mg/dL (ref 7–25)
CO2: 29 mmol/L (ref 20–31)
Calcium: 9 mg/dL (ref 8.6–10.4)
Chloride: 98 mmol/L (ref 98–110)
Creat: 0.66 mg/dL (ref 0.60–0.93)
Total Bilirubin: 0.5 mg/dL (ref 0.2–1.2)
Total Protein: 7.1 g/dL (ref 6.1–8.1)

## 2015-10-08 LAB — COMPLETE METABOLIC PANEL WITH GFR
Albumin: 3.8 g/dL (ref 3.6–5.1)
Alkaline Phosphatase: 63 U/L (ref 33–130)
GFR, Est African American: >89 mL/min (ref >=60)
GFR, Est Non African American: 89 mL/min (ref >=60)
Glucose, Bld: 95 mg/dL (ref 65–99)
Potassium: 4.6 mmol/L (ref 3.5–5.3)
Sodium: 132 mmol/L — ABNORMAL LOW (ref 135–146)

## 2015-10-08 LAB — LIPID PANEL
Cholesterol: 158 mg/dL (ref 125–200)
HDL: 47 mg/dL (ref >=46)
LDL Cholesterol: 86 mg/dL (ref <130)
Total CHOL/HDL Ratio: 3.4 Ratio (ref <=5.0)
Triglycerides: 123 mg/dL (ref <150)
VLDL: 25 mg/dL (ref <30)

## 2015-10-08 MED ORDER — MENTHOL (TOPICAL ANALGESIC) 4 % EX GEL: CUTANEOUS | Status: DC

## 2015-10-08 NOTE — Patient Instructions (Signed)
Vim s?n s??n (Costochondritis) Vim s?n s??n, ?i khi ???c g?i l h?i ch?ng [place], l tnh tr?ng s?ng v kch ?ng (vim) cc m (s?n) n?i x??ng s??n v?i x??ng ng?c (x??ng ?c). N gy ?au ? ng?c v khu v?c x??ng s??n. Vim s?n s??n th??ng t? kh?i sau m?t th?i gian. C th? m?t t?i 6 tu?n ho?c lu h?n ?? b?nh ?? h?n, ??c bi?t l n?u qu v? khng th? h?n ch? cc ho?t ??ng c?a mnh. NGUYN NHN  M?t s? tr??ng h?p vim s?n s??n khng r nguyn nhn. Nh?ng nguyn nhn c th? c bao g?m:  T?n th??ng (ch?n th??ng).  T?p th? d?c ho?c ho?t ??ng ch?ng h?n nh? nng nh?c.  Ho d? d?i. D?U HI?U V TRI?U CH?NG  ?au v c?m gic ?au ? ng?c v khu v?c x??ng s??n.  ?au n?ng h?n khi ho ho?c th? su.  ?au n?ng h?n khi c cc c? ??ng ring bi?t. CH?N ?ON  Chuyn gia ch?m [place] s?c kh?e c?a qu v? s? khm th?c th? v h?i v? nh?ng tri?u ch?ng c?a qu v?. C th? ch?p X quang l?ng ng?c ho?c cc xt nghi?m khc ?? lo?i tr? nh?ng v?n ?? khc. ?I?U TR?  Vim s?n s??n th??ng t? kh?i sau m?t th?i gian. Chuyn gia ch?m [place] s?c kh?e c th? k ??n thu?c ?? gi?m ?au. H??NG D?N CH?M [place] T?I NH   Trnh ho?t ??ng th? l?c g?ng s?c. C? g?ng khng ko c?ng x??ng s??n trong khi ho?t ??ng bnh th??ng. ?i?u ny c th? bao g?m b?t k? ho?t ??ng no c s? d?ng c? ng?c, b?ng, v ? m?ng s??n, ??c bi?t l n?u s? d?ng cc v?t n?ng.  Ch??m ? vo vng b? ?nh h??ng trong 2 ngy ??u sau khi b?t ??u ?au.  Cho ? vo ti nh?a.  ?? kh?n t?m vo gi?a da v ti.  ?? ? l?nh trong kho?ng 20 pht, 2 - 3 l?n m?t ngy.  Ch? s? d?ng thu?c khng c?n k ??n ho?c thu?c c?n k ??n theo ch? d?n c?a chuyn gia ch?m [place] s?c kh?e. ?I KHM N?U:  Qu v? b? t?y ?? ho?c s?ng ? cc kh?p x??ng s??n. ?y l nh?ng d?u hi?u nhi?m trng.  C?n ?au c?a qu v? khng h?t m?c d ? ngh? ng?i ho?c dng thu?c. NGAY L?P T?C ?I KHM N?U:   C?n ?au c?a qu v? t?ng ln ho?c c?m th?y r?t kh ch?u.  Qu v? b? th? d?c ho?c kh th?.  Qu v? ho ra mu.  Qu v? b?  ?au ng?c n?ng h?n, ?? m? hi, ho?c nn m?a.  Qu v? b? s?t ho?c cc tri?u ch?ng ko di h?n 2 - 3 ngy.  Qu v? b? s?t v cc tri?u ch?ng c?a qu v? ??t nhin n?ng ln. ??M B?O QU V?:   Hi?u cc h??ng d?n ny.  S? theo di tnh tr?ng c?a mnh.  S? yu c?u tr? gip ngay l?p t?c n?u b?n c?m th?y khng kh?e ho?c th?y tr?m tr?ng h?n.   Thng tin ny khng nh?m m?c ?ch thay th? cho l?i khuyn m chuyn gia ch?m [place] s?c kh?e ni v?i qu v?. Hy b?o ??m qu v? ph?i th?o lu?n b?t k? v?n ?? g m qu v? c v?i chuyn gia ch?m  s?c kh?e c?a qu v?.   Document Released: 08/31/2005 Document Revised: 09/11/2013 Elsevier Interactive Patient Education [employer].

## 2015-10-08 NOTE — Progress Notes (Signed)
 Chief Complaint:  Chief Complaint  Patient presents with  . Right side pain    Trunk, onset 3 months    HPI: Jessica Zavala is a 69 y.o. female who reports to [facility] today complaining of 3 month hx of chest wall pain, RUQ abd pain. RLQ rib pain, she has had this mostly at night , she doe snot notice it when she is awake. We have done numerous labs and radiography studies on her incuding, chest, rib, and abdomen without any acute changes. She is worried about her liver and if her cancer is back. She denies nay n/w/t. She has had breast cancer, has had lumpectomy, has had chemo and rxt. Takes tylenol which helps a lot.  she ahs htn and also hyperlidemia.   Past Medical History  Diagnosis Date  . Cancer ([place])     Right side, sp lumpectomy, rxt and chemo  . Hyperlipidemia   . Hypertension    Past Surgical History  Procedure Laterality Date  . Breast surgery     Social History   Social History  . Marital Status: Married    Spouse Name: N/A  . Number of Children: N/A  . Years of Education: N/A   Social History Main Topics  . Smoking status: Never Smoker   . Smokeless tobacco: Never Used  . Alcohol Use: No  . Drug Use: No  . Sexual Activity: Not Asked   Other Topics Concern  . None   Social History Narrative   No family history on file. Allergies  Allergen Reactions  . Aspirin Swelling    Mouth and facial swelling   Prior to Admission medications   Medication Sig Start Date End Date Taking? Authorizing Provider  labetalol (NORMODYNE) 300 MG tablet Take 200 mg by mouth 2 (two) times daily.   Yes Historical Provider, MD  olmesartan (BENICAR) 40 MG tablet Take 40 mg by mouth daily.   Yes Historical Provider, MD  simvastatin (ZOCOR) 20 MG tablet Take 20 mg by mouth every evening.   Yes Historical Provider, MD  Menthol, Topical Analgesic, (BIOFREEZE COLORLESS) 4 % GEL Use in affected area TID prn 10/08/15    P , DO     ROS: The patient denies fevers, chills,  night sweats, unintentional weight loss, chest pain, palpitations, wheezing, dyspnea on exertion, nausea, vomiting, abdominal pain, dysuria, hematuria, melena, numbness, weakness, or tingling.  All other systems have been reviewed and were otherwise negative with the exception of those mentioned in the HPI and as above.    PHYSICAL EXAM: Filed Vitals:   10/08/15 0857  BP: 118/72  Pulse: 71  Temp: 97.3 F (36.3 C)  Resp: 16   Body mass index is 24.46 kg/(m^2).   General: Alert, no acute distress HEENT:  Normocephalic, atraumatic, oropharynx patent. EOMI, PERRLA Cardiovascular:  Regular rate and rhythm, no rubs murmurs or gallops.  No Carotid bruits, radial pulse intact. No pedal edema.  Respiratory: Clear to auscultation bilaterally.  No wheezes, rales, or rhonchi.  No cyanosis, no use of accessory musculature Abdominal: No organomegaly, abdomen is soft and non-tender, positive bowel sounds. No masses. + minimal tenderness RLQ rib  On plaption.  Skin: No rashes. Neurologic: Facial musculature symmetric. Psychiatric: Patient acts appropriately throughout our interaction. Lymphatic: No cervical or submandibular lymphadenopathy Musculoskeletal: Gait intact. No edema, tenderness   LABS: Results for orders placed or performed in visit on 10/08/15  COMPLETE METABOLIC PANEL WITH GFR  Result Value Ref Range  Sodium 132 (L) 135 - 146 mmol/L   Potassium 4.6 3.5 - 5.3 mmol/L   Chloride 98 98 - 110 mmol/L   CO2 29 20 - 31 mmol/L   Glucose, Bld 95 65 - 99 mg/dL   BUN 12 7 - 25 mg/dL   Creat 0.66 0.60 - 0.93 mg/dL   Total Bilirubin 0.5 0.2 - 1.2 mg/dL   Alkaline Phosphatase 63 33 - 130 U/L   AST 20 10 - 35 U/L   ALT 10 6 - 29 U/L   Total Protein 7.1 6.1 - 8.1 g/dL   Albumin 3.8 3.6 - 5.1 g/dL   Calcium 9.0 8.6 - 10.4 mg/dL   GFR, Est African American >89 >=60 mL/min   GFR, Est Non African American 89 >=60 mL/min  Lipid panel  Result Value Ref Range   Cholesterol 158 125 - 200  mg/dL   Triglycerides 123 <150 mg/dL   HDL 47 >=46 mg/dL   Total CHOL/HDL Ratio 3.4 <=5.0 Ratio   VLDL 25 <30 mg/dL   LDL Cholesterol 86 <130 mg/dL     EKG/XRAY:   Primary read interpreted by Dr. Marin Comment at [facility].   ASSESSMENT/PLAN: Encounter Diagnoses  Name Primary?  . RUQ abdominal pain Yes  . Costochondritis   . Essential hypertension   . Hyperlipidemia    CMP, lipids  pending Rx Biofreeze, cannot take NSAIDs, already on tylenol, she is trying to avoid taking nay narcotics after we discussed   Gross sideeffects, risk and benefits, and alternatives of medications d/w patient. Patient is aware that all medications have potential sideeffects and we are unable to predict every sideeffect or drug-drug interaction that may occur.    DO  10/10/2015 11:04 AM

## 2015-10-09 ENCOUNTER — Telehealth: Payer: Self-pay | Admitting: Family Medicine

## 2015-10-09 NOTE — Telephone Encounter (Signed)
Unable to leave message, will send lab letter

## 2015-10-10 ENCOUNTER — Encounter: Payer: Self-pay | Admitting: Family Medicine

## 2015-12-15 DIAGNOSIS — Z23 Encounter for immunization: Secondary | ICD-10-CM | POA: Diagnosis not present

## 2015-12-18 DIAGNOSIS — H04123 Dry eye syndrome of bilateral lacrimal glands: Secondary | ICD-10-CM | POA: Diagnosis not present

## 2015-12-18 DIAGNOSIS — Z961 Presence of intraocular lens: Secondary | ICD-10-CM | POA: Diagnosis not present

## 2015-12-18 DIAGNOSIS — H10413 Chronic giant papillary conjunctivitis, bilateral: Secondary | ICD-10-CM | POA: Diagnosis not present

## 2015-12-20 ENCOUNTER — Encounter (HOSPITAL_COMMUNITY): Payer: Self-pay

## 2015-12-20 ENCOUNTER — Emergency Department (HOSPITAL_COMMUNITY): Payer: Commercial Managed Care - HMO

## 2015-12-20 ENCOUNTER — Emergency Department (HOSPITAL_COMMUNITY)
Admission: EM | Admit: 2015-12-20 | Discharge: 2015-12-20 | Disposition: A | Discharge status: Home / Self Care | Payer: Commercial Managed Care - HMO | Attending: Emergency Medicine | Admitting: Emergency Medicine

## 2015-12-20 DIAGNOSIS — Z79899 Other long term (current) drug therapy: Secondary | ICD-10-CM | POA: Insufficient documentation

## 2015-12-20 DIAGNOSIS — Z859 Personal history of malignant neoplasm, unspecified: Secondary | ICD-10-CM | POA: Insufficient documentation

## 2015-12-20 DIAGNOSIS — I10 Essential (primary) hypertension: Secondary | ICD-10-CM | POA: Insufficient documentation

## 2015-12-20 DIAGNOSIS — N3091 Cystitis, unspecified with hematuria: Secondary | ICD-10-CM

## 2015-12-20 DIAGNOSIS — E785 Hyperlipidemia, unspecified: Secondary | ICD-10-CM | POA: Diagnosis not present

## 2015-12-20 DIAGNOSIS — N3090 Cystitis, unspecified without hematuria: Secondary | ICD-10-CM | POA: Diagnosis not present

## 2015-12-20 DIAGNOSIS — B9689 Other specified bacterial agents as the cause of diseases classified elsewhere: Secondary | ICD-10-CM | POA: Diagnosis not present

## 2015-12-20 DIAGNOSIS — R319 Hematuria, unspecified: Secondary | ICD-10-CM | POA: Diagnosis not present

## 2015-12-20 HISTORY — DX: Pure hypercholesterolemia, unspecified: E78.00

## 2015-12-20 LAB — URINE MICROSCOPIC-ADD ON

## 2015-12-20 LAB — URINALYSIS, ROUTINE W REFLEX MICROSCOPIC
Bilirubin Urine: NEGATIVE
GLUCOSE, UA: NEGATIVE mg/dL
Ketones, ur: NEGATIVE mg/dL
NITRITE: NEGATIVE
PROTEIN: 100 mg/dL — AB
Specific Gravity, Urine: 1.014 (ref 1.005–1.030)
pH: 6.5 (ref 5.0–8.0)

## 2015-12-20 LAB — CBC WITH DIFFERENTIAL/PLATELET
BASOS ABS: 0 10*3/uL (ref 0.0–0.1)
BASOS PCT: 0 %
Eosinophils Absolute: 0.5 10*3/uL (ref 0.0–0.7)
Eosinophils Relative: 4 %
HEMATOCRIT: 39.3 % (ref 36.0–46.0)
Hemoglobin: 13.5 g/dL (ref 12.0–15.0)
LYMPHS PCT: 14 %
Lymphs Abs: 1.7 10*3/uL (ref 0.7–4.0)
MCH: 31.2 pg (ref 26.0–34.0)
MCHC: 34.4 g/dL (ref 30.0–36.0)
MCV: 90.8 fL (ref 78.0–100.0)
Monocytes Absolute: 0.5 10*3/uL (ref 0.1–1.0)
Monocytes Relative: 4 %
NEUTROS ABS: 9.8 10*3/uL — AB (ref 1.7–7.7)
Neutrophils Relative %: 78 %
PLATELETS: 256 10*3/uL (ref 150–400)
RBC: 4.33 MIL/uL (ref 3.87–5.11)
RDW: 12.8 % (ref 11.5–15.5)
WBC: 12.4 10*3/uL — AB (ref 4.0–10.5)

## 2015-12-20 LAB — BASIC METABOLIC PANEL
ANION GAP: 7 (ref 5–15)
BUN: 19 mg/dL (ref 6–20)
CO2: 27 mmol/L (ref 22–32)
Calcium: 9.3 mg/dL (ref 8.9–10.3)
Chloride: 102 mmol/L (ref 101–111)
Creatinine, Ser: 0.71 mg/dL (ref 0.44–1.00)
Glucose, Bld: 115 mg/dL — ABNORMAL HIGH (ref 65–99)
POTASSIUM: 3.8 mmol/L (ref 3.5–5.1)
SODIUM: 136 mmol/L (ref 135–145)

## 2015-12-20 MED ORDER — PHENAZOPYRIDINE HCL 200 MG PO TABS: 200.0000 mg | ORAL_TABLET | Freq: Three times a day (TID) | ORAL | Status: DC

## 2015-12-20 MED ORDER — LEVOFLOXACIN 750 MG PO TABS: 750.0000 mg | ORAL_TABLET | Freq: Every day | ORAL | Status: DC

## 2015-12-20 NOTE — ED Notes (Signed)
Dr. Delo at bedside. 

## 2015-12-20 NOTE — ED Notes (Signed)
Pt here for painless blood in urine noted at 0230

## 2015-12-20 NOTE — Discharge Instructions (Signed)
Levaquin and Pyridium as prescribed.  Follow-up with your primary Dr. in the next week to discuss the findings on your CT scan.   Nhi?m Trng ???ng Ti?t Ni?u (Urinary Tract Infection) Nhi?m trng ???ng ti?t ni?u (UTI) c th? pht tri?n ?? b?t c? vi? tri? na?o d?c ???ng ti?t ni?u. ???ng ti?t ni?u l h? th?ng thot n??c c?a c? th? ?? lo?i b? ch?t th?i v n??c d? th?a. ???ng ti?t ni?u bao g?m hai th?n, ni?u qu?n, bng quang v ni?u ??o. Th?n l m?t c?p c? quan hnh h?t ??u. M?i th?n co? kch th??c kho?ng b?ng bn tay c?a b?n. Chng n?m d??i x??ng s??n, m?i bn c?t s?ng c m?t qu?[facility]N Nhi?m trng gy b?i vi sinh v?t, l cc sinh v?t nh?, bao g?m n?m, vi rt v vi khu?n. Nh?ng sinh v?t ny nh? t?i m?c chng ch? c th? ???c nhn th?y qua knh hi?n vi. Vi khu?n l nh?ng vi sinh v?t ph? bi?n nh?t gy ra UTI. TRI?U CH?NG Cc tri?u ch?ng c?a UTI c th? khc nhau, ty theo ?? tu?i v gi?i tnh c?a b?nh nhn v b?i v? tr nhi?m trng. Cc tri?u ch?ng ? ph? n? tr? th??ng bao g?m nhu c?u ti?u ti?n th??ng xuyn v d? d?i, c?m gic ?au v rt ? bng quang ho?c ni?u ??o khi ?i ti?u. Ph? n? v nam gi?i l?n tu?i c nhi?u kh? n?ng b? m?t m?i, run r?y v y?u, ??ng th?i b? ?au c? v ?au b?ng. S?t c th? c ngh?a l nhi?m trng ? th?n. Cc tri?u ch?ng khc c?a nhi?m trng th?n bao g?m ?au ? l?ng ho?c hai bn d??i x??ng s??n, bu?n nn v nn m?a. CH?N ?ON ?? ch?n ?on UTI, chuyn gia ch?m [place] s?c kh?e s? h?i b?n v? nh?ng tri?u ch?ng c?a b?n. Chuyn gia ch?m [place] s?c kh?e c?ng s? yu c?u cung c?p m?u n??c ti?u. M?u n??c ti?u s? ???c xt nghi?m xem c vi khu?n v cc t? bo mu tr?ng khng. Cc t? bo mu tr?ng ???c t?o b?i c? th? ?? gip ch?ng l?i nhi?m trng. ?I?U TR? Thng th??ng, UTI c th? ???c ?i?u tr? b?ng thu?c. B?i v h?u h?t nguyn nhn gy ra UTI l do nhi?m trng b?i vi khu?n, chng th??ng c th? ???c ?i?u tr? b?ng cch s? d?ng thu?c khng sinh. Vi?c l?a ch?n khng sinh v th?i gian ?i?u tr? ph? thu?c  vo tri?u ch?ng v lo?i vi khu?n gy nhi?m trng. H??NG D?N CH?M [place] T?I NH  N?u b?n ? ???c k khng sinh, hy s? d?ng chng ?ng nh? chuyn gia ch?m [place] s?c kh?e h??ng d?n. S? d?ng h?t thu?c ngay c? khi b?n c?m th?y kh h?n sau khi ch? dng m?t ph?n thu?c.  U?ng ?? n??c v dung d?ch ?? n??c ti?u trong ho?c c mu vng nh?t.  Trnh caffeine, tr v ?? u?ng c ga. Chng c xu h??ng kch thch bng quang.  ?i ti?u th??ng xuyn. Trnh nh?n ti?u trong th?i gian di.  ?i ti?u tr??c v sau khi quan h? tnh d?c.  Sau khi ?i ??i ti?n, ph? n? c?n lm s?ch t? tr??c ra sau. Ch? s? d?ng gi?y v? sinh m?t l?n. HY ?I KHM N?U:  B?n b? ?au l?ng.  B?n b? s?t.  Cc tri?u ch?ng c?a b?n khng b?t ??u ?? trong vng 3 ngy. HY NGAY L?P T?C ?I KHM N?U:  B?n b? ?au l?ng ho?c ?au b?ng d??i nghim tr?ng.  B?n b? ?n l?nh.  B?n b? bu?n nn ho?c nn m?a.  B?n lin t?c b? rt ho?c kh ch?u khi ?i ti?u. ??M B?O B?N:  Hi?u cc h??ng d?n ny.  S? theo di tnh tr?ng c?a mnh.  S? yu c?u tr? gip ngay l?p t?c n?u b?n c?m th?y khng ?? ho?c tnh tr?ng tr?m tr?ng h?n.   Thng tin ny khng nh?m m?c ?ch thay th? cho l?i khuyn m chuyn gia ch?m [place] s?c kh?e ni v?i qu v?. Hy b?o ??m qu v? ph?i th?o lu?n b?t k? v?n ?? g m qu v? c v?i chuyn gia ch?m [place] s?c kh?e c?a qu v?.   Document Released: 11/21/2005 Document Revised: 07/24/2013 Elsevier Interactive Patient Education [employer].

## 2015-12-20 NOTE — ED Notes (Signed)
Patient verbalized understanding of discharge instructions and denies any further needs or questions at this time. VS stable. Patient ambulatory with steady gait.  

## 2015-12-20 NOTE — ED Provider Notes (Signed)
CSN: [id number]     Arrival date & time 12/20/15  0356 History   First MD Initiated Contact with Jessica Zavala 12/20/15 [phone]     Chief Complaint  Jessica Zavala presents with  . Hematuria     (Consider location/radiation/quality/duration/timing/severity/associated sxs/prior Treatment) HPI Comments: Jessica Zavala is a 70 year old [place] female brought for evaluation of hematuria. Jessica Zavala felt fine yesterday up until the time Jessica Zavala went to urinate this morning. Jessica Zavala urinated and it was bloody. Jessica Zavala denies any fevers or chills. Jessica Zavala denies any abdominal pain. Jessica Zavala does report some pressure when urinating.  Of note is that this Jessica Zavala does not speak [place], only [place]. The history was taken with the assistance of her son who is at bedside and acts as Optometrist.  Jessica Zavala is a 70 y.o. female presenting with hematuria. The history is provided by the Jessica Zavala.  Hematuria This is a new problem. The current episode started 1 to 2 hours ago. The problem occurs constantly. The problem has not changed since onset.Pertinent negatives include no abdominal pain. Nothing aggravates the symptoms. Nothing relieves the symptoms. Jessica Zavala has tried nothing for the symptoms.    Past Medical History  Diagnosis Date  . Cancer ([place])     Right side, sp lumpectomy, rxt and chemo  . Hyperlipidemia   . Hypertension   . High cholesterol    Past Surgical History  Procedure Laterality Date  . Breast surgery     History reviewed. No pertinent family history. Social History  Substance Use Topics  . Smoking status: Never Smoker   . Smokeless tobacco: Never Used  . Alcohol Use: No   OB History    No data available     Review of Systems  Gastrointestinal: Negative for abdominal pain.  Genitourinary: Positive for hematuria.  All other systems reviewed and are negative.     Allergies  Aspirin  Home Medications   Prior to Admission medications   Medication Sig Start Date End Date Taking? Authorizing Provider  labetalol  (NORMODYNE) 300 MG tablet Take 200 mg by mouth 2 (two) times daily.    Historical Provider, MD  Menthol, Topical Analgesic, (BIOFREEZE COLORLESS) 4 % GEL Use in affected area TID prn 10/08/15   Thao P Le, DO  olmesartan (BENICAR) 40 MG tablet Take 40 mg by mouth daily.    Historical Provider, MD  simvastatin (ZOCOR) 20 MG tablet Take 20 mg by mouth every evening.    Historical Provider, MD   BP 202/94 mmHg  Pulse 94  Temp(Src) 97.9 F (36.6 C) (Oral)  Resp 18  SpO2 96% Physical Exam  Constitutional: Jessica Zavala is oriented to person, place, and time. Jessica Zavala appears well-developed and well-nourished. No distress.  HENT:  Head: Normocephalic and atraumatic.  Neck: Normal range of motion. Neck supple.  Cardiovascular: Normal rate and regular rhythm.  Exam reveals no gallop and no friction rub.   No murmur heard. Pulmonary/Chest: Effort normal and breath sounds normal. No respiratory distress. Jessica Zavala has no wheezes.  Abdominal: Soft. Bowel sounds are normal. Jessica Zavala exhibits no distension. There is no tenderness.  Musculoskeletal: Normal range of motion.  Neurological: Jessica Zavala is alert and oriented to person, place, and time.  Skin: Skin is warm and dry. Jessica Zavala is not diaphoretic.  Nursing note and vitals reviewed.   ED Course  Procedures (including critical care time) Labs Review Labs Reviewed  URINALYSIS, ROUTINE W REFLEX MICROSCOPIC (NOT AT [facility])    Imaging Review No results found. I have personally reviewed and evaluated these images and lab  results as part of my medical decision-making.   EKG Interpretation None      MDM   Final diagnoses:  None    Jessica Zavala presents here with complaints of hematuria. Her urinalysis reveals too numerous to count red cells and too numerous to count white cells. There are also many bacteria consistent with a hemorrhagic cystitis. CT scan performed by renal protocol reveals no evidence for kidney stone or kidney lesions. There is inflammation surrounding the  bladder with supports the diagnosis of a hemorrhagic cystitis. Jessica Zavala will be treated with Levaquin, Pyridium, and when necessary return.  CT scan also reveals a moderate-sized pleural effusion on the right. The significance of this I am not certain. There is the possibility of compressive atelectasis or superimposed pneumonia in this area. This would also be treated with Levaquin.  Jessica Zavala is to follow-up with her primary Dr. this week to discuss this effusion. Jessica Zavala will likely require referral to cardiothoracic surgery and possibly a thoracentesis.    Veryl Speak, MD 12/20/15 [phone]

## 2015-12-22 DIAGNOSIS — N390 Urinary tract infection, site not specified: Secondary | ICD-10-CM | POA: Diagnosis not present

## 2015-12-22 DIAGNOSIS — Z6824 Body mass index (BMI) 24.0-24.9, adult: Secondary | ICD-10-CM | POA: Diagnosis not present

## 2015-12-22 DIAGNOSIS — I700 Atherosclerosis of aorta: Secondary | ICD-10-CM | POA: Diagnosis not present

## 2015-12-22 DIAGNOSIS — J90 Pleural effusion, not elsewhere classified: Secondary | ICD-10-CM | POA: Diagnosis not present

## 2015-12-22 DIAGNOSIS — C50919 Malignant neoplasm of unspecified site of unspecified female breast: Secondary | ICD-10-CM | POA: Diagnosis not present

## 2015-12-22 DIAGNOSIS — I10 Essential (primary) hypertension: Secondary | ICD-10-CM | POA: Diagnosis not present

## 2015-12-22 DIAGNOSIS — R319 Hematuria, unspecified: Secondary | ICD-10-CM | POA: Diagnosis not present

## 2015-12-23 ENCOUNTER — Other Ambulatory Visit (HOSPITAL_COMMUNITY): Payer: Self-pay | Admitting: Internal Medicine

## 2015-12-23 DIAGNOSIS — J90 Pleural effusion, not elsewhere classified: Secondary | ICD-10-CM

## 2015-12-30 ENCOUNTER — Ambulatory Visit (HOSPITAL_COMMUNITY)
Admission: RE | Admit: 2015-12-30 | Discharge: 2015-12-30 | Disposition: A | Discharge status: Home / Self Care | Payer: Commercial Managed Care - HMO | Source: Ambulatory Visit | Attending: Internal Medicine | Admitting: Internal Medicine

## 2015-12-30 DIAGNOSIS — J948 Other specified pleural conditions: Secondary | ICD-10-CM | POA: Diagnosis not present

## 2015-12-30 DIAGNOSIS — I77810 Thoracic aortic ectasia: Secondary | ICD-10-CM | POA: Insufficient documentation

## 2015-12-30 DIAGNOSIS — Z853 Personal history of malignant neoplasm of breast: Secondary | ICD-10-CM | POA: Diagnosis not present

## 2015-12-30 DIAGNOSIS — J90 Pleural effusion, not elsewhere classified: Secondary | ICD-10-CM | POA: Diagnosis not present

## 2015-12-30 DIAGNOSIS — C7951 Secondary malignant neoplasm of bone: Secondary | ICD-10-CM | POA: Insufficient documentation

## 2015-12-30 DIAGNOSIS — R222 Localized swelling, mass and lump, trunk: Secondary | ICD-10-CM | POA: Diagnosis not present

## 2015-12-30 LAB — BODY FLUID CELL COUNT WITH DIFFERENTIAL
LYMPHS FL: 70 %
MONOCYTE-MACROPHAGE-SEROUS FLUID: 30 % — AB (ref 50–90)
Total Nucleated Cell Count, Fluid: 9820 cu mm — ABNORMAL HIGH (ref 0–1000)

## 2015-12-30 LAB — GRAM STAIN

## 2015-12-30 LAB — PROTEIN, BODY FLUID: Total protein, fluid: 4.7 g/dL

## 2015-12-30 NOTE — Procedures (Signed)
US guided right thoracentesis.  Removed 400 ml of clear yellow fluid.  Minimal blood loss and no immediate complication.

## 2015-12-31 DIAGNOSIS — C7951 Secondary malignant neoplasm of bone: Secondary | ICD-10-CM | POA: Diagnosis not present

## 2015-12-31 DIAGNOSIS — C50919 Malignant neoplasm of unspecified site of unspecified female breast: Secondary | ICD-10-CM | POA: Diagnosis not present

## 2015-12-31 DIAGNOSIS — I10 Essential (primary) hypertension: Secondary | ICD-10-CM | POA: Diagnosis not present

## 2015-12-31 DIAGNOSIS — J90 Pleural effusion, not elsewhere classified: Secondary | ICD-10-CM | POA: Diagnosis not present

## 2016-01-04 ENCOUNTER — Telehealth: Payer: Self-pay | Admitting: Hematology and Oncology

## 2016-01-04 LAB — CULTURE, BODY FLUID-BOTTLE: CULTURE: NO GROWTH

## 2016-01-04 LAB — CULTURE, BODY FLUID W GRAM STAIN -BOTTLE

## 2016-01-04 NOTE — Telephone Encounter (Signed)
New patient appt-s/w patient and gave np appt for 01/31 @ 3:45 w/Dr. Lindi Adie.  Referring Dr. [name] Dx- breast ca  Referral information scanned under media tab

## 2016-01-05 ENCOUNTER — Ambulatory Visit (HOSPITAL_BASED_OUTPATIENT_CLINIC_OR_DEPARTMENT_OTHER): Payer: Commercial Managed Care - HMO | Admitting: Hematology and Oncology

## 2016-01-05 ENCOUNTER — Encounter: Payer: Self-pay | Admitting: Hematology and Oncology

## 2016-01-05 DIAGNOSIS — N63 Unspecified lump in breast: Secondary | ICD-10-CM | POA: Diagnosis not present

## 2016-01-05 DIAGNOSIS — C7951 Secondary malignant neoplasm of bone: Secondary | ICD-10-CM

## 2016-01-05 DIAGNOSIS — C50511 Malignant neoplasm of lower-outer quadrant of right female breast: Secondary | ICD-10-CM

## 2016-01-05 NOTE — Progress Notes (Signed)
Unable to get in to exam room prior to MD.  No assessment performed.  

## 2016-01-05 NOTE — Progress Notes (Signed)
[facility] CONSULT NOTE  Patient Care Team: Camille Bal, MD as PCP - General (Pediatric Hematology and Oncology)  CHIEF COMPLAINTS/PURPOSE OF CONSULTATION:  Newly diagnosed breast cancer  HISTORY OF PRESENTING ILLNESS:  Jessica Zavala 70 y.o. female is here because of recent diagnosis of  Lung nodules with bone lesions and pleural effusion suspicious for metastatic breast cancer. Patient had a previous history of breast cancer 1996 where she underwent lumpectomy followed by chemotherapy radiation 5 years of tamoxifen therapy. The exact details of chemotherapy were not available because it was done on the paper charting system. Recently she had a urinary tract infection and was complaining of some mild chest discomfort with pain radiating from the front to the back. CT of the chest was obtained that revealed right pleural effusion in addition to a mediastinal soft tissue mass and mixed lytic and sclerotic bone lesions. This is in addition to chest wall soft tissue masses measuring 14 and 10 mm respectively. She underwent thoracentesis which did not reveal malignant cells 400 mL was removed.  She is today here accompanied by her husband and son and we had discussion with the help of an interpreter.  I reviewed her records extensively and collaborated the history with the patient.  SUMMARY OF ONCOLOGIC HISTORY:   Breast cancer of lower-outer quadrant of right female breast ([place])   01/04/1995 Surgery Right breast cancer status post lumpectomy followed by chemotherapy followed by radiation and tamoxifen 5 years (details are not available)   12/30/2015 Relapse/Recurrence Chest wall/ soft tissue masses near the right side of the sternum, superior nodule measuring 14.5 mm, inferior nodule measuring 10.5 mm, 26 mm anterior mediastinal soft tissue mass, T9 bone metastases   12/30/2015 Procedure Thoracentesis: 400 mL removed; showed reactive mesothelial cells, no cancer cells   12/30/2015 Imaging CT chest: 26 x 17 mm anterior mediastinal soft tissue mass, mixed lytic and sclerotic bone mets mainly involving sternum and T9 vertebral body, scattered vertebral body involvement and rib involvement; chest wall soft tissue masses 14.5 mm, 10.5 mm   MEDICAL HISTORY:  Past Medical History  Diagnosis Date  . Cancer ([place])     Right side, sp lumpectomy, rxt and chemo  . Hyperlipidemia   . Hypertension   . High cholesterol     SURGICAL HISTORY: Past Surgical History  Procedure Laterality Date  . Breast surgery      SOCIAL HISTORY: Social History   Social History  . Marital Status: Married    Spouse Name: N/A  . Number of Children: N/A  . Years of Education: N/A   Occupational History  . Not on file.   Social History Main Topics  . Smoking status: Never Smoker   . Smokeless tobacco: Never Used  . Alcohol Use: No  . Drug Use: No  . Sexual Activity: Not on file   Other Topics Concern  . Not on file   Social History Narrative    FAMILY HISTORY: No family history on file.  ALLERGIES:  is allergic to aspirin.  MEDICATIONS:  Current Outpatient Prescriptions  Medication Sig Dispense Refill  . acetaminophen (TYLENOL) 325 MG tablet Take 650 mg by mouth every 6 (six) hours as needed for mild pain.    . cromolyn (OPTICROM) 4 % ophthalmic solution Place 1 drop into the right eye 4 (four) times daily.    Marland Kitchen labetalol (NORMODYNE) 200 MG tablet Take 200 mg by mouth 2 (two) times daily.    Marland Kitchen levofloxacin (LEVAQUIN) 750 MG  tablet Take 1 tablet (750 mg total) by mouth daily. X 7 days 7 tablet 0  . Menthol, Topical Analgesic, (BIOFREEZE COLORLESS) 4 % GEL Use in affected area TID prn (Patient not taking: Reported on 12/20/2015) 100 mL 0  . olmesartan (BENICAR) 40 MG tablet Take 40 mg by mouth daily.    . Omega-3 Fatty Acids (FISH OIL PO) Take 1 capsule by mouth daily.    . phenazopyridine (PYRIDIUM) 200 MG tablet Take 1 tablet (200 mg total) by mouth 3 (three) times  daily. 6 tablet 0  . simvastatin (ZOCOR) 40 MG tablet Take 40 mg by mouth daily.     No current facility-administered medications for this visit.    REVIEW OF SYSTEMS:   Constitutional: Denies fevers, chills or abnormal night sweats Eyes: Denies blurriness of vision, double vision or watery eyes Ears, nose, mouth, throat, and face: Denies mucositis or sore throat Respiratory: Denies cough, dyspnea or wheezes Cardiovascular: Denies palpitation, chest discomfort or lower extremity swelling Gastrointestinal:  Denies nausea, heartburn or change in bowel habits Skin: Denies abnormal skin rashes Lymphatics: Denies new lymphadenopathy or easy bruising Neurological:Denies numbness, tingling or new weaknesses Behavioral/Psych: Mood is stable, no new changes  Breast:  Right chest wall tenderness and pain All other systems were reviewed with the patient and are negative.  PHYSICAL EXAMINATION: ECOG PERFORMANCE STATUS: 1 - Symptomatic but completely ambulatory  There were no vitals filed for this visit. There were no vitals filed for this visit.  GENERAL:alert, no distress and comfortable SKIN: skin color, texture, turgor are normal, no rashes or significant lesions EYES: normal, conjunctiva are pink and non-injected, sclera clear OROPHARYNX:no exudate, no erythema and lips, buccal mucosa, and tongue normal  NECK: supple, thyroid normal size, non-tender, without nodularity LYMPH:  no palpable lymphadenopathy in the cervical, axillary or inguinal LUNGS: clear to auscultation and percussion with normal breathing effort HEART: regular rate & rhythm and no murmurs and no lower extremity edema ABDOMEN:abdomen soft, non-tender and normal bowel sounds Musculoskeletal:no cyanosis of digits and no clubbing  PSYCH: alert & oriented x 3 with fluent speech NEURO: no focal motor/sensory deficits BREAST: palpable firm mass in the upper part of the chest next the sternum  On the right side.No palpable  axillary or supraclavicular lymphadenopathy (exam performed in the presence of a chaperone)   LABORATORY DATA:  I have reviewed the data as listed Lab Results  Component Value Date   WBC 12.4* 12/20/2015   HGB 13.5 12/20/2015   HCT 39.3 12/20/2015   MCV 90.8 12/20/2015   PLT 256 12/20/2015   Lab Results  Component Value Date   NA 136 12/20/2015   K 3.8 12/20/2015   CL 102 12/20/2015   CO2 27 12/20/2015    ASSESSMENT AND PLAN:  Breast cancer of lower-outer quadrant of right female breast ([place]) CT chest 12/30/2015: 26 x 17 mm anterior mediastinal soft tissue mass, mixed lytic and sclerotic bone mets mainly involving sternum and T9 vertebral body, scattered vertebral body involvement and rib involvement; chest wall soft tissue masses 14.5 mm, 10.5 mm Thoracentesis 12/30/2015: 400 mL pleural fluid reactive mesothelial cells no malignant cells identified. (Right breast cancer status post lumpectomy followed by chemotherapy followed by radiation and tamoxifen 5 years (details are not available)  Suspicion for metastatic breast cancer: Recommendations: 1. Biopsy of the chest wall recurrence 2. Send the tumor for ER/PR and HER-2 testing. 3. Obtain a whole-body PET/CT scan 4. Based on the above results we will come up with  a treatment plan.   Return to clinic in 1-2 weeks for follow-up of the tests and procedures.   All questions were answered. The patient knows to call the clinic with any problems, questions or concerns.    Gudena, Vinay K, MD 01/05/2016    

## 2016-01-05 NOTE — Addendum Note (Signed)
Addended by: Prentiss Bells on: 01/05/2016 05:48 PM   Modules accepted: Orders

## 2016-01-05 NOTE — Assessment & Plan Note (Signed)
CT chest 12/30/2015: 26 x 17 mm anterior mediastinal soft tissue mass, mixed lytic and sclerotic bone mets mainly involving sternum and T9 vertebral body, scattered vertebral body involvement and rib involvement; chest wall soft tissue masses 14.5 mm, 10.5 mm Thoracentesis 12/30/2015: 400 mL pleural fluid reactive mesothelial cells no malignant cells identified. (Right breast cancer status post lumpectomy followed by chemotherapy followed by radiation and tamoxifen 5 years (details are not available)  Suspicion for metastatic breast cancer: Recommendations: 1. Biopsy of the chest wall recurrence 2. Send the tumor for ER/PR and HER-2 testing. 3. Obtain a whole-body PET/CT scan 4. Based on the above results we will come up with a treatment plan.   Return to clinic in 1-2 weeks for follow-up of the tests and procedures.

## 2016-01-06 ENCOUNTER — Telehealth: Payer: Self-pay | Admitting: Hematology and Oncology

## 2016-01-06 NOTE — Telephone Encounter (Signed)
Spoke with son and he is aware of bx at the breast center and the follow up here

## 2016-01-11 ENCOUNTER — Encounter: Payer: Self-pay | Admitting: Internal Medicine

## 2016-01-11 ENCOUNTER — Other Ambulatory Visit: Payer: Self-pay | Admitting: Hematology and Oncology

## 2016-01-11 DIAGNOSIS — C50512 Malignant neoplasm of lower-outer quadrant of left female breast: Secondary | ICD-10-CM

## 2016-01-11 DIAGNOSIS — R1310 Dysphagia, unspecified: Secondary | ICD-10-CM

## 2016-01-12 ENCOUNTER — Other Ambulatory Visit: Payer: Self-pay | Admitting: *Deleted

## 2016-01-12 ENCOUNTER — Telehealth: Payer: Self-pay | Admitting: *Deleted

## 2016-01-12 DIAGNOSIS — C50511 Malignant neoplasm of lower-outer quadrant of right female breast: Secondary | ICD-10-CM

## 2016-01-12 NOTE — Telephone Encounter (Signed)
New order placed

## 2016-01-12 NOTE — Telephone Encounter (Signed)
"  Jessica Zavala with the Breast center calling in reference to this patient scheduled tomorrow.  We need a co-sign over Nurse's name for this order or cancellation.  Can only accept orders from a nurse practitioner or a doctor.  Cannot accept orderes from an Therapist, sports, co-sign required."    Voicemail left for collaborative nurse.

## 2016-01-13 ENCOUNTER — Telehealth: Payer: Self-pay

## 2016-01-13 ENCOUNTER — Other Ambulatory Visit: Payer: Self-pay | Admitting: Hematology and Oncology

## 2016-01-13 ENCOUNTER — Ambulatory Visit
Admission: RE | Admit: 2016-01-13 | Discharge: 2016-01-13 | Disposition: A | Discharge status: Home / Self Care | Payer: Commercial Managed Care - HMO | Source: Ambulatory Visit | Attending: Hematology and Oncology | Admitting: Hematology and Oncology

## 2016-01-13 ENCOUNTER — Ambulatory Visit (HOSPITAL_COMMUNITY): Payer: Commercial Managed Care - HMO

## 2016-01-13 ENCOUNTER — Other Ambulatory Visit: Payer: Self-pay | Admitting: *Deleted

## 2016-01-13 ENCOUNTER — Inpatient Hospital Stay: Admission: RE | Admit: 2016-01-13 | Payer: Commercial Managed Care - HMO | Source: Ambulatory Visit

## 2016-01-13 DIAGNOSIS — C50511 Malignant neoplasm of lower-outer quadrant of right female breast: Secondary | ICD-10-CM

## 2016-01-13 DIAGNOSIS — R222 Localized swelling, mass and lump, trunk: Secondary | ICD-10-CM | POA: Diagnosis not present

## 2016-01-13 DIAGNOSIS — N63 Unspecified lump in breast: Secondary | ICD-10-CM | POA: Diagnosis not present

## 2016-01-13 DIAGNOSIS — C50211 Malignant neoplasm of upper-inner quadrant of right female breast: Secondary | ICD-10-CM | POA: Diagnosis not present

## 2016-01-13 NOTE — Telephone Encounter (Signed)
Peggy the scheduler for nuc med states pt has orders that have not been electronically signed. The pt is scheduled for tomorrow. Please call Peggy back when signed. Her number [phone] and you may leave a message

## 2016-01-14 ENCOUNTER — Other Ambulatory Visit: Payer: Self-pay | Admitting: *Deleted

## 2016-01-14 ENCOUNTER — Encounter (HOSPITAL_COMMUNITY)
Admission: RE | Admit: 2016-01-14 | Discharge: 2016-01-14 | Disposition: A | Discharge status: Home / Self Care | Payer: Commercial Managed Care - HMO | Source: Ambulatory Visit | Attending: Hematology and Oncology | Admitting: Hematology and Oncology

## 2016-01-14 DIAGNOSIS — C50911 Malignant neoplasm of unspecified site of right female breast: Secondary | ICD-10-CM | POA: Diagnosis not present

## 2016-01-14 DIAGNOSIS — C50511 Malignant neoplasm of lower-outer quadrant of right female breast: Secondary | ICD-10-CM | POA: Diagnosis not present

## 2016-01-14 LAB — GLUCOSE, CAPILLARY: GLUCOSE-CAPILLARY: 94 mg/dL (ref 65–99)

## 2016-01-14 MED ORDER — FLUDEOXYGLUCOSE F - 18 (FDG) INJECTION
5.7000 | Freq: Once | INTRAVENOUS | Status: AC | PRN
  Administered 2016-01-14: 5.7 via INTRAVENOUS

## 2016-01-18 ENCOUNTER — Ambulatory Visit: Payer: Self-pay | Admitting: Surgery

## 2016-01-18 DIAGNOSIS — C50919 Malignant neoplasm of unspecified site of unspecified female breast: Secondary | ICD-10-CM | POA: Diagnosis not present

## 2016-01-18 DIAGNOSIS — C799 Secondary malignant neoplasm of unspecified site: Secondary | ICD-10-CM | POA: Diagnosis not present

## 2016-01-18 NOTE — H&P (Signed)
History of Present Illness Jessica Zavala. Jessica Schlotterbeck MD; 01/18/2016 12:49 PM) Patient words: breast f/u.  The patient is a 70 year old female who presents with breast cancer. PCP - Dr. [name] Oncology - Dr. [name]  Reason for consultation - metastatic right breast cancer This is a 70 year old [place] female who is status post right breast cancer in 1996. This was located in the axillary tail of [place]. She underwent lumpectomy followed by chemotherapy, radiation, and 5 years of tamoxifen. In 2007 she underwent a right breast lumpectomy that revealed focal atypical ductal hyperplasia. Recently the patient had a urinary tract infection and also complained of some mild right chest discomfort. A CT scan of the chest showed a right pleural effusion as well as a soft tissue mass in her mediastinum and multiple lytic and sclerotic bone lesions. She also had right chest wall soft tissue masses measuring 14 and 10 mm. Thoracentesis removed 400 mL's of fluid. Cytology was reportedly negative. Subsequently she underwent mammogram and ultrasound. In the right breast at 2:00 there are 2 adjacent palpable nodules. The larger is located at 2:00 12 cm from the nipple measuring 2.2 x 1.3 x 2 cm. At 2:30, there is a 1.7 x 1.0 x 1.9 cm mass. She underwent biopsy of these areas on 01/13/16 which revealed invasive ductal carcinoma. A biopsy clip was placed. She was then referred for surgical evaluation. However she has subsequently had a full body CT PET which showed widespread metastatic disease involving the medial right breast, right chest wall, mediastinum including the right internal mammary lymph nodes, right pleural space, and right common iliac as well as numerous osseous lesions in the thorax. She comes in today for discussion accompanied by her husband, her son, and a certified interpreter.  CLINICAL DATA: Status post thoracentesis. Evaluate right pleural effusion.  EXAM: CT CHEST WITHOUT  CONTRAST  TECHNIQUE: Multidetector CT imaging of the chest was performed following the standard protocol without IV contrast.  COMPARISON: Abdominal CT scan 12/20/2015  FINDINGS: Mediastinum/Nodes: Status post right mastectomy and right axillary lymph node dissection. There are it to chest wall soft tissue masses near the right aspect of the sternum which are likely chest wall recurrence is. The more superior nodule measures 14.5 mm on image number 23 and a more inferior nodule measures 10.5 mm on image number 26. The left breast appears normal. No supraclavicular adenopathy. The thyroid gland appears normal.  The heart is mildly enlarged but stable. No pericardial effusion. Moderate tortuosity, ectasia and calcification of the thoracic aorta.  There is a soft tissue lesion in the anterior mediastinum on image number 19 which measures 26 x 17 mm. This could be tumor coming from the sternum or it could be adenopathy. No other mediastinal or hilar mass or adenopathy. The esophagus is grossly normal.  Lungs/Pleura: Small residual right-sided pleural effusion following the ultrasound-guided thoracentesis. No postprocedural pneumothorax. There is bibasilar atelectasis. No worrisome pulmonary lesions. The  Upper abdomen: No significant upper abdominal findings.  Musculoskeletal: Mixed lytic and sclerotic osseous metastatic disease is noted, mainly involving the sternum an the T9 vertebral body. Other scattered vertebral body involvement and also scattered rib involvement. No canal compromise.  IMPRESSION: 1. 26 x 17 mm anterior mediastinal soft tissue mass. This could be tumor coming from the sternum or adenopathy. 2. Osseous metastatic disease. 3. Right-sided chest wall recurrence.   Electronically Signed By: Marijo Sanes M.D. On: 12/30/2015 14:53  CLINICAL DATA: 70 year old female with history of right lumpectomy in 1994.  The patient had a recent chest CT  demonstrating metastatic disease of the right chest wall, osseous structures, and mediastinum.  EXAM: DIGITAL DIAGNOSTIC BILATERAL MAMMOGRAM WITH 3D TOMOSYNTHESIS WITH CAD  ULTRASOUND RIGHT BREAST  COMPARISON: Previous exam(s).  ACR Breast Density Category c: The breast tissue is heterogeneously dense, which may obscure small masses.  FINDINGS: Postlumpectomy changes are again noted of the right breast. No new or suspicious abnormality is noted within either breast. The palpable abnormalities in the medial right breast is not included in today's images.  Mammographic images were processed with CAD.  On physical exam, 2 adjacent palpable nodules are identified at 2 o'clock, 12 cm from the nipple and 230, 11 cm from the nipple.  Targeted ultrasound is performed, showing an irregular, hypoechoic mass at 2 o'clock, 12 cm from the nipple measuring 2.2 x 1.3 x 2 cm. An additional irregular hypoechoic mass is identified at 230, 11 cm from the nipple measuring 1.7 x 1 x 1.9 cm.  IMPRESSION: Highly suspicious right breast masses, likely originating from underlying osseous metastases.  RECOMMENDATION: Ultrasound-guided biopsy which will be performed today and dictated separately.  I have discussed the findings and recommendations with the patient. Results were also provided in writing at the conclusion of the visit. If applicable, a reminder letter will be sent to the patient regarding the next appointment.  BI-RADS CATEGORY 5: Highly suggestive of malignancy.   Electronically Signed By: [name] M.D. On: 01/13/2016 15:46  CLINICAL DATA: 70 year old female for ultrasound-guided biopsy of a right chest wall mass.  EXAM: ULTRASOUND GUIDED RIGHT BREAST CORE NEEDLE BIOPSY  COMPARISON: Previous exam(s).  FINDINGS: I met with the patient and we discussed the procedure of ultrasound-guided biopsy, including benefits and alternatives. We discussed the high  likelihood of a successful procedure. We discussed the risks of the procedure, including infection, bleeding, tissue injury, clip migration, and inadequate sampling. Informed written consent was given. The usual time-out protocol was performed immediately prior to the procedure.  Using sterile technique and 1% Lidocaine as local anesthetic, under direct ultrasound visualization, a 14 gauge spring-loaded device was used to perform biopsy of a right chest wall mass at 230, 11 cm from the nipple, using a lateral to medial approach. Of note, the biopsy images are incorrectly labeled 230, 12 cm from the nipple instead of 230, 11 cm from the nipple. At the conclusion of the procedure a spiral shaped tissue marker clip was deployed into the biopsied mass.  IMPRESSION: Ultrasound guided biopsy of a right chest wall mass at 230, 11 cm from the nipple. No apparent complications.  Electronically Signed: By: [name] M.D. On: 01/13/2016 16:19  ADDENDUM REPORT: 01/14/2016 12:05 ADDENDUM: Pathology revealed grade II invasive ductal carcinoma in the right breast. This was found to be concordant by Dr. [name]. Pathology results were discussed with the patient's son [name] by telephone, as the patient does not speak [place]. The patient is reported as doing well after the biopsy. Post biopsy instructions and care were reviewed and questions were answered. The patient was encouraged to call [facility] for any additional concerns. Surgical consultation has been arranged with Dr. Donnie Mesa at [facility] on January 18, 2016. Pathology results reported by Susa Raring RN, BSN on 01/14/2016. Electronically Signed By: [name] M.D. On: 01/14/2016 12:05  CLINICAL DATA: Subsequent Treatment strategy for breast cancer. Restaging examination  EXAM: NUCLEAR MEDICINE PET SKULL BASE TO THIGH  TECHNIQUE: 5.7 mCi F-18 FDG was  injected  intravenously. Full-ring PET imaging was performed from the skull base to thigh after the radiotracer. CT data was obtained and used for attenuation correction and anatomic localization.  FASTING BLOOD GLUCOSE: Value: 94 mg/dl  COMPARISON: Chest CT 12/30/2015. CT the abdomen and pelvis 12/20/2015.  FINDINGS: NECK  No hypermetabolic lymph nodes in the neck. There is diffuse hypermetabolic activity in the larynx which is symmetric bilaterally, presumably physiologic. Additionally, there is extensive hypermetabolism throughout the oropharynx, which also appears relatively symmetric, also favored to be physiologic.  CHEST  Several areas of soft tissue thickening thickening in the medial aspect of the the right pectoralis major muscle extending to the underlying chest wall, largest of which measures up to 1.3 x 3.7 cm (image 64 of series 4) and is hypermetabolic (SUVmax = 35.0). In addition, there is right internal mammary lymphadenopathy measuring up to 1.7 x 2.2 cm (image 56 of series 4) which is hypermetabolic (SUVmax = 09.3). Several pleural-based nodules are noted in the right hemithorax, largest of which are located inferiorly (image 87 of series 4) measuring 2.1 x 1.1 cm and 2.7 x 2.7 cm (image 73 of series 4) and are hypermetabolic (SUVmax = 6.2). This is associated with a moderate right-sided pleural effusion which is presumably malignant. Nonenlarged but hypermetabolic (SUVmax = 4.3) low right paratracheal lymph node measuring 8 mm in short axis. Right juxta phrenic lymph node measuring 7 mm in short axis (image 86 of series 4) is hypermetabolic (SUVmax = 4.8). No suspicious appearing pulmonary nodules or masses. There is mild diffuse ground-glass attenuation and interlobular septal thickening, suggesting developing interstitial pulmonary edema. Heart size is mildly enlarged. There is no significant pericardial fluid, thickening or pericardial calcification.  Atherosclerosis in the thoracic aorta and great vessels of the mediastinum. Esophagus is unremarkable in appearance. Status post right axillary nodal dissection. No pathologically enlarged or hypermetabolic axillary lymph nodes.  ABDOMEN/PELVIS  No abnormal hypermetabolic activity within the liver, pancreas, adrenal glands, or spleen. 9 mm short axis right common iliac lymph node (image 132 of series 4) is hypermetabolic (SUVmax = 81.8).  SKELETON  Numerous mixed lytic and sclerotic lesions are noted throughout the axial skeleton, most evident in the sternum, manubrium, multiple thoracic vertebral bodies and multiple right-sided ribs, compatible with widespread metastatic disease to the bones.  IMPRESSION: 1. Today's study demonstrates widespread metastatic disease involving the medial aspect of the right breast, right chest wall, mediastinum (most notable for right internal mammary lymph nodes), right pleural space (including a malignant right pleural effusion), an isolated lymph node in the right common iliac nodal chain, and numerous osseous lesions in the thorax, as detailed above. 2. Additional incidental findings, as above.   Electronically Signed By: Vinnie Langton M.D. On: 01/14/2016 09:59    Other Problems (Sonya Bynum, CMA; 01/18/2016 11:33 AM) Chest pain High blood pressure Hypercholesterolemia  Past Surgical History Marjean Donna, CMA; 01/18/2016 11:33 AM) Breast Biopsy Right.  Diagnostic Studies History Marjean Donna, CMA; 01/18/2016 11:33 AM) Colonoscopy 1-5 years ago Mammogram within last year Pap Smear 1-5 years ago  Allergies Marjean Donna, CMA; 01/18/2016 11:34 AM) Aspirin *ANALGESICS - NonNarcotic*  Medication History (Sonya Bynum, CMA; 01/18/2016 11:35 AM) Labetalol HCl (200MG Tablet, Oral) Active. Losartan Potassium (100MG Tablet, Oral) Active. Simvastatin (40MG Tablet, Oral) Active. Medications Reconciled  Social History Marjean Donna, CMA; 01/18/2016 11:33 AM) Caffeine use Coffee. No alcohol use No drug use Tobacco use Never smoker.  Family History Marjean Donna, [place]; 01/18/2016 11:33 AM) Family history unknown [employer] Relatives  Pregnancy / Birth History Marjean Donna, CMA; 01/18/2016 11:33 AM) Age at menarche 89 years. Age of menopause <43 Gravida 80 Maternal age 51-25 Para 4 Regular periods     Review of Systems Davy Pique Bynum CMA; 01/18/2016 11:33 AM) General Not Present- Appetite Loss, Chills, Fatigue, Fever, Night Sweats, Weight Gain and Weight Loss. Skin Present- Dryness. Not Present- Change in Wart/Mole, Hives, Jaundice, New Lesions, Non-Healing Wounds, Rash and Ulcer. HEENT Not Present- Earache, Hearing Loss, Hoarseness, Nose Bleed, Oral Ulcers, Ringing in the Ears, Seasonal Allergies, Sinus Pain, Sore Throat, Visual Disturbances, Wears glasses/contact lenses and Yellow Eyes. Respiratory Not Present- Bloody sputum, Chronic Cough, Difficulty Breathing, Snoring and Wheezing. Breast Present- Breast Pain. Not Present- Breast Mass, Nipple Discharge and Skin Changes. Cardiovascular Present- Chest Pain. Not Present- Difficulty Breathing Lying Down, Leg Cramps, Palpitations, Rapid Heart Rate, Shortness of Breath and Swelling of Extremities. Gastrointestinal Not Present- Abdominal Pain, Bloating, Bloody Stool, Change in Bowel Habits, Chronic diarrhea, Constipation, Difficulty Swallowing, Excessive gas, Gets full quickly at meals, Hemorrhoids, Indigestion, Nausea, Rectal Pain and Vomiting. Female Genitourinary Not Present- Frequency, Nocturia, Painful Urination, Pelvic Pain and Urgency. Musculoskeletal Not Present- Back Pain, Joint Pain, Joint Stiffness, Muscle Pain, Muscle Weakness and Swelling of Extremities. Neurological Not Present- Decreased Memory, Fainting, Headaches, Numbness, Seizures, Tingling, Tremor, Trouble walking and Weakness. Psychiatric Not Present- Anxiety, Bipolar, Change in Sleep  Pattern, Depression, Fearful and Frequent crying. Endocrine Not Present- Cold Intolerance, Excessive Hunger, Hair Changes, Heat Intolerance, Hot flashes and New Diabetes. Hematology Not Present- Easy Bruising, Excessive bleeding, Gland problems, HIV and Persistent Infections.  Vitals (Sonya Bynum CMA; 01/18/2016 11:33 AM) 01/18/2016 11:33 AM Weight: 115 lb Height: 60in Body Surface Area: 1.48 m Body Mass Index: 22.46 kg/m  Temp.: 73F(Temporal)  Pulse: 75 (Regular)  BP: 126/76 (Sitting, Left Arm, Standard)      Physical Exam Rodman Key K. Zacharia Sowles MD; 01/18/2016 12:53 PM)  The physical exam findings are as follows: Note:WDWN in NAD HEENT: EOMI, sclera anicteric Neck: No masses, no thyromegaly Lungs: CTA bilaterally; normal respiratory effort Right axilla - healed axillary incision - no palpable lymphadenopathy Tender along lateral right chest wall Firm palpable mass upper medial right chest near the edge of the sternum No left breast masses CV: Regular rate and rhythm; no murmurs Abd: +bowel sounds, soft, non-tender, no masses Ext: Well-perfused; no edema Skin: Warm, dry; no sign of jaundice    Assessment & Plan Rodman Key K. Janney Priego MD; 01/18/2016 12:16 PM)  METASTATIC BREAST CANCER (C50.919)  Current Plans Schedule for Surgery - Left subclavian vein port placement. The surgical procedure has been discussed with the patient. Potential risks, benefits, alternative treatments, and expected outcomes have been explained. All of the patient's questions at this time have been answered. The likelihood of reaching the patient's treatment goal is good. The patient understand the proposed surgical procedure and wishes to proceed.  Jessica Zavala. Georgette Dover, MD, [facility]  General/ Trauma Surgery  01/18/2016 12:54 PM

## 2016-01-19 ENCOUNTER — Ambulatory Visit (HOSPITAL_BASED_OUTPATIENT_CLINIC_OR_DEPARTMENT_OTHER): Payer: Commercial Managed Care - HMO | Admitting: Hematology and Oncology

## 2016-01-19 ENCOUNTER — Telehealth: Payer: Self-pay | Admitting: Hematology and Oncology

## 2016-01-19 ENCOUNTER — Ambulatory Visit (HOSPITAL_COMMUNITY)
Admission: RE | Admit: 2016-01-19 | Discharge: 2016-01-19 | Disposition: A | Discharge status: Home / Self Care | Payer: Commercial Managed Care - HMO | Source: Ambulatory Visit | Attending: Hematology and Oncology | Admitting: Hematology and Oncology

## 2016-01-19 ENCOUNTER — Encounter (HOSPITAL_COMMUNITY): Payer: Self-pay

## 2016-01-19 ENCOUNTER — Encounter: Payer: Self-pay | Admitting: Hematology and Oncology

## 2016-01-19 VITALS — BP 161/73 | HR 77 | Temp 97.5°F | Resp 18 | Wt 116.7 lb

## 2016-01-19 DIAGNOSIS — R599 Enlarged lymph nodes, unspecified: Secondary | ICD-10-CM | POA: Insufficient documentation

## 2016-01-19 DIAGNOSIS — C7951 Secondary malignant neoplasm of bone: Secondary | ICD-10-CM | POA: Insufficient documentation

## 2016-01-19 DIAGNOSIS — Z170 Estrogen receptor positive status [ER+]: Secondary | ICD-10-CM

## 2016-01-19 DIAGNOSIS — C50512 Malignant neoplasm of lower-outer quadrant of left female breast: Secondary | ICD-10-CM | POA: Insufficient documentation

## 2016-01-19 DIAGNOSIS — C7989 Secondary malignant neoplasm of other specified sites: Secondary | ICD-10-CM | POA: Diagnosis not present

## 2016-01-19 DIAGNOSIS — C50511 Malignant neoplasm of lower-outer quadrant of right female breast: Secondary | ICD-10-CM

## 2016-01-19 DIAGNOSIS — C50811 Malignant neoplasm of overlapping sites of right female breast: Secondary | ICD-10-CM | POA: Diagnosis not present

## 2016-01-19 DIAGNOSIS — C801 Malignant (primary) neoplasm, unspecified: Secondary | ICD-10-CM | POA: Diagnosis not present

## 2016-01-19 MED ORDER — PALBOCICLIB 125 MG PO CAPS: 125.0000 mg | ORAL_CAPSULE | Freq: Every day | ORAL | Status: DC

## 2016-01-19 MED ORDER — LETROZOLE 2.5 MG PO TABS: 2.5000 mg | ORAL_TABLET | Freq: Every day | ORAL | Status: DC

## 2016-01-19 MED ORDER — IOHEXOL 300 MG/ML  SOLN
100.0000 mL | Freq: Once | INTRAMUSCULAR | Status: AC | PRN
  Administered 2016-01-19: 80 mL via INTRAVENOUS

## 2016-01-19 NOTE — Telephone Encounter (Signed)
Gave patient avs report and appointments for 2/28.

## 2016-01-19 NOTE — Progress Notes (Signed)
Patient Care Team: [name], MD as PCP - General (Internal Medicine)  DIAGNOSIS: No matching staging information was found for the patient.  SUMMARY OF ONCOLOGIC HISTORY:   Breast cancer of lower-outer quadrant of right female breast ([place])   01/04/1995 Surgery Right breast cancer status post lumpectomy followed by chemotherapy followed by radiation and tamoxifen 5 years (details are not available)   12/30/2015 Relapse/Recurrence Chest wall/ soft tissue masses near the right side of the sternum, superior nodule measuring 14.5 mm, inferior nodule measuring 10.5 mm, 26 mm anterior mediastinal soft tissue mass, T9 bone metastases   12/30/2015 Procedure Thoracentesis: 400 mL removed; showed reactive mesothelial cells, no cancer cells   12/30/2015 Imaging CT chest: 26 x 17 mm anterior mediastinal soft tissue mass, mixed lytic and sclerotic bone mets mainly involving sternum and T9 vertebral body, scattered vertebral body involvement and rib involvement; chest wall soft tissue masses 14.5 mm, 10.5 mm   01/13/2016 Initial Biopsy Right chest wall mass biopsy 2:30 position: Invasive ductal carcinoma focally involves skeletal muscle, grade 2, HER-2 negative ratio 1.08, ER/PR pending   01/14/2016 PET scan Widespread metastatic disease right breast, right chest wall, mediastinum, right internal mammary lymph nodes, right pleural space, malignant pleural effusion, right common iliac lymph node, bone metastases in the thorax    CHIEF COMPLIANT: Follow-up to review PET CT scan and right chest wall biopsy  INTERVAL HISTORY: Jessica Zavala is a 70 year old with above-mentioned history of right-sided breast cancer diagnosed in 1996 treated with lumpectomy followed by chemotherapy and radiation and tamoxifen for 5 years. She presented incidentally found chest wall recurrence along with pleural effusion that was drained on 12/30/2015. The pleural effusion did not reveal any malignant cells. She subsequently underwent  right chest wall biopsy on 01/13/2016 which came back as invasive ductal carcinoma that was ER/PR positive. She underwent a PET/CT scan and is here today. Risks discussed the results of the PET as well as a biopsy. She does not complain of any chest pain.  REVIEW OF SYSTEMS:   Constitutional: Denies fevers, chills or abnormal weight loss Eyes: Denies blurriness of vision Ears, nose, mouth, throat, and face: Denies mucositis or sore throat Respiratory: Denies cough, dyspnea or wheezes Cardiovascular: Denies palpitation, chest discomfort Gastrointestinal:  Denies nausea, heartburn or change in bowel habits Skin: Denies abnormal skin rashes Lymphatics: Denies new lymphadenopathy or easy bruising Neurological:Denies numbness, tingling or new weaknesses Behavioral/Psych: Mood is stable, no new changes  Extremities: No lower extremity edema Breast:  denies any pain or lumps or nodules in either breasts All other systems were reviewed with the patient and are negative.  I have reviewed the past medical history, past surgical history, social history and family history with the patient and they are unchanged from previous note.  ALLERGIES:  is allergic to aspirin.  MEDICATIONS:  Current Outpatient Prescriptions  Medication Sig Dispense Refill  . acetaminophen (TYLENOL) 325 MG tablet Take 650 mg by mouth every 6 (six) hours as needed for mild pain.    . cromolyn (OPTICROM) 4 % ophthalmic solution Place 1 drop into the right eye 4 (four) times daily.    Marland Kitchen labetalol (NORMODYNE) 200 MG tablet Take 200 mg by mouth 2 (two) times daily.    Marland Kitchen levofloxacin (LEVAQUIN) 750 MG tablet Take 1 tablet (750 mg total) by mouth daily. X 7 days 7 tablet 0  . Menthol, Topical Analgesic, (BIOFREEZE COLORLESS) 4 % GEL Use in affected area TID prn (Patient not taking: Reported on 12/20/2015)  100 mL 0  . olmesartan (BENICAR) 40 MG tablet Take 40 mg by mouth daily.    . Omega-3 Fatty Acids (FISH OIL PO) Take 1 capsule by  mouth daily.    . phenazopyridine (PYRIDIUM) 200 MG tablet Take 1 tablet (200 mg total) by mouth 3 (three) times daily. 6 tablet 0  . simvastatin (ZOCOR) 40 MG tablet Take 40 mg by mouth daily.     No current facility-administered medications for this visit.    PHYSICAL EXAMINATION: ECOG PERFORMANCE STATUS: 1 - Symptomatic but completely ambulatory  Filed Vitals:   01/19/16 1359  BP: 161/73  Pulse: 77  Temp: 97.5 F (36.4 C)  Resp: 18   Filed Weights   01/19/16 1359  Weight: 116 lb 11.2 oz (52.935 kg)    GENERAL:alert, no distress and comfortable SKIN: skin color, texture, turgor are normal, no rashes or significant lesions EYES: normal, Conjunctiva are pink and non-injected, sclera clear OROPHARYNX:no exudate, no erythema and lips, buccal mucosa, and tongue normal  NECK: supple, thyroid normal size, non-tender, without nodularity LYMPH:  no palpable lymphadenopathy in the cervical, axillary or inguinal LUNGS: Diminished breath sounds at the right lung base from pleural effusion HEART: regular rate & rhythm and no murmurs and no lower extremity edema ABDOMEN:abdomen soft, non-tender and normal bowel sounds MUSCULOSKELETAL:no cyanosis of digits and no clubbing  NEURO: alert & oriented x 3 with fluent speech, no focal motor/sensory deficits EXTREMITIES: No lower extremity edema   LABORATORY DATA:  I have reviewed the data as listed   Chemistry      Component Value Date/Time   NA 136 12/20/2015 0509   K 3.8 12/20/2015 0509   CL 102 12/20/2015 0509   CO2 27 12/20/2015 0509   BUN 19 12/20/2015 0509   CREATININE 0.71 12/20/2015 0509   CREATININE 0.66 10/08/2015 0954      Component Value Date/Time   CALCIUM 9.3 12/20/2015 0509   ALKPHOS 63 10/08/2015 0954   AST 20 10/08/2015 0954   ALT 10 10/08/2015 0954   BILITOT 0.5 10/08/2015 0954      Lab Results  Component Value Date   WBC 12.4* 12/20/2015   HGB 13.5 12/20/2015   HCT 39.3 12/20/2015   MCV 90.8 12/20/2015     PLT 256 12/20/2015   NEUTROABS 9.8* 12/20/2015   ASSESSMENT & PLAN:  Breast cancer of lower-outer quadrant of right female breast ([place]) Metastatic breast cancer PET-CT 01/14/16: Widespread metastatic disease right breast, right chest wall several nodules largest 3.7 cm, mediastinum, right internal mammary lymph node 2.2 cm, right pleural-based nodules 2.1cm and 2.7 cm, malignant pleural effusion, right common iliac lymph node, bone metastases in the thorax (sternum, manubrium, multiple thoracic vertebral bodies, multiple right-sided ribs)  Right chest wall mass biopsy 2:30 position 01/13/2016: Invasive ductal carcinoma focally involves skeletal muscle, grade 2, HER-2 negative ratio 1.08, ER/PR pending (Right breast cancer 1996 status post lumpectomy followed by chemotherapy followed by radiation and tamoxifen 5 years (details are not available)) ---------------------------------------------------------------------------------------------------------------------------------------------------------- Radiology and pathology review: I discussed with the patient the details of the pathology report as well as the PET/CT scan negative reviewed the films with the patient.  Treatment plan: Leslee Home with letrozole to start 01/20/2016 Ibrance: I discussed the risks and benefits of Ibrance including myelosuppression especially neutropenia and with that risk of infection, there is risk of pulmonary embolism and mild peripheral neuropathy as well. Fatigue, nausea, diarrhea, decreased appetite as well as alopecia and thrombocytopenia are also potential side effects of Ibrance  Letrozole counseling: We discussed the risks and benefits of anti-estrogen therapy with aromatase inhibitors. These include but not limited to insomnia, hot flashes, mood changes, vaginal dryness, bone density loss, and weight gain. Although rare, serious side effects including endometrial cancer, risk of blood clots were also discussed. We  strongly believe that the benefits far outweigh the risks. Patient understands these risks and consented to starting treatment.   Return to clinic in 2 weeks for lab, check and follow-up.  No orders of the defined types were placed in this encounter.   The patient has a good understanding of the overall plan. she agrees with it. she will call with any problems that may develop before the next visit here.   Rulon Eisenmenger, MD 01/19/2016

## 2016-01-19 NOTE — Progress Notes (Signed)
Unable to get in to exam room prior to MD.  No assessment performed.  

## 2016-01-19 NOTE — Assessment & Plan Note (Signed)
Metastatic breast cancer: PET-CT 01/14/16: Widespread metastatic disease right breast, right chest wall several nodules largest 3.7 cm, mediastinum, right internal mammary lymph node 2.2 cm, right pleural-based nodules 2.1cm and 2.7 cm, malignant pleural effusion, right common iliac lymph node, bone metastases in the thorax (sternum, manubrium, multiple thoracic vertebral bodies, multiple right-sided ribs) Right chest wall mass biopsy 2:30 position 01/13/2016: Invasive ductal carcinoma focally involves skeletal muscle, grade 2, HER-2 negative ratio 1.08, ER/PR pending (Right breast cancer 1996 status post lumpectomy followed by chemotherapy followed by radiation and tamoxifen 5 years (details are not available)) ------------------------------------------------------------------------------------------------------------------------------------------------------------- Radiology and pathology review: I discussed with the patient the details of the pathology report as well as the PET/CT scan negative reviewed the films with the patient.

## 2016-01-20 MED FILL — *IBRANCE 125 MG CAPSULE: 125 | 21 days supply | Qty: 21 | Fill #0

## 2016-01-21 ENCOUNTER — Encounter (HOSPITAL_COMMUNITY): Payer: Commercial Managed Care - HMO

## 2016-01-25 ENCOUNTER — Ambulatory Visit (HOSPITAL_COMMUNITY)
Admission: RE | Admit: 2016-01-25 | Discharge: 2016-01-25 | Disposition: A | Discharge status: Home / Self Care | Payer: Commercial Managed Care - HMO | Source: Ambulatory Visit | Attending: Hematology and Oncology | Admitting: Hematology and Oncology

## 2016-01-25 DIAGNOSIS — K449 Diaphragmatic hernia without obstruction or gangrene: Secondary | ICD-10-CM | POA: Diagnosis not present

## 2016-01-25 DIAGNOSIS — R1310 Dysphagia, unspecified: Secondary | ICD-10-CM | POA: Diagnosis not present

## 2016-02-01 NOTE — Assessment & Plan Note (Signed)
Metastatic breast cancer PET-CT 01/14/16: Widespread metastatic disease right breast, right chest wall several nodules largest 3.7 cm, mediastinum, right internal mammary lymph node 2.2 cm, right pleural-based nodules 2.1cm and 2.7 cm, malignant pleural effusion, right common iliac lymph node, bone metastases in the thorax (sternum, manubrium, multiple thoracic vertebral bodies, multiple right-sided ribs)  Right chest wall mass biopsy 2:30 position 01/13/2016: Invasive ductal carcinoma focally involves skeletal muscle, grade 2, HER-2 negative ratio 1.08, ER/PR pending (Right breast cancer 1996 status post lumpectomy followed by chemotherapy followed by radiation and tamoxifen 5 years (details are not available)) ---------------------------------------------------------------------------------------------------------------------------------------------------------- Radiology and pathology review: I discussed with the patient the details of the pathology report as well as the PET/CT scan negative reviewed the films with the patient.  Treatment plan: Jessica Zavala with letrozole to start 01/20/2016 Ibrance toxicities:  RTC in 2 weeks for lab check

## 2016-02-02 ENCOUNTER — Telehealth: Payer: Self-pay | Admitting: Hematology and Oncology

## 2016-02-02 ENCOUNTER — Other Ambulatory Visit (HOSPITAL_BASED_OUTPATIENT_CLINIC_OR_DEPARTMENT_OTHER): Payer: Commercial Managed Care - HMO

## 2016-02-02 ENCOUNTER — Encounter: Payer: Self-pay | Admitting: Hematology and Oncology

## 2016-02-02 ENCOUNTER — Ambulatory Visit (HOSPITAL_BASED_OUTPATIENT_CLINIC_OR_DEPARTMENT_OTHER): Payer: Commercial Managed Care - HMO | Admitting: Hematology and Oncology

## 2016-02-02 VITALS — BP 184/84 | HR 64 | Temp 98.0°F | Resp 17 | Ht <= 58 in | Wt 116.4 lb

## 2016-02-02 DIAGNOSIS — C50511 Malignant neoplasm of lower-outer quadrant of right female breast: Secondary | ICD-10-CM

## 2016-02-02 DIAGNOSIS — I10 Essential (primary) hypertension: Secondary | ICD-10-CM

## 2016-02-02 DIAGNOSIS — C7951 Secondary malignant neoplasm of bone: Secondary | ICD-10-CM

## 2016-02-02 DIAGNOSIS — Z853 Personal history of malignant neoplasm of breast: Secondary | ICD-10-CM

## 2016-02-02 DIAGNOSIS — Z79811 Long term (current) use of aromatase inhibitors: Secondary | ICD-10-CM | POA: Diagnosis not present

## 2016-02-02 LAB — CBC WITH DIFFERENTIAL/PLATELET
BASO%: 0.2 % (ref 0.0–2.0)
BASOS ABS: 0 10*3/uL (ref 0.0–0.1)
EOS%: 3 % (ref 0.0–7.0)
Eosinophils Absolute: 0.1 10*3/uL (ref 0.0–0.5)
HCT: 35 % (ref 34.8–46.6)
HGB: 11.7 g/dL (ref 11.6–15.9)
LYMPH%: 54.7 % — AB (ref 14.0–49.7)
MCH: 30.4 pg (ref 25.1–34.0)
MCHC: 33.4 g/dL (ref 31.5–36.0)
MCV: 91 fL (ref 79.5–101.0)
MONO#: 0.1 10*3/uL (ref 0.1–0.9)
MONO%: 3.5 % (ref 0.0–14.0)
NEUT#: 1.1 10*3/uL — ABNORMAL LOW (ref 1.5–6.5)
NEUT%: 38.6 % (ref 38.4–76.8)
Platelets: 230 10*3/uL (ref 145–400)
RBC: 3.84 10*6/uL (ref 3.70–5.45)
RDW: 12.9 % (ref 11.2–14.5)
WBC: 2.8 10*3/uL — ABNORMAL LOW (ref 3.9–10.3)
lymph#: 1.5 10*3/uL (ref 0.9–3.3)

## 2016-02-02 LAB — COMPREHENSIVE METABOLIC PANEL
ALT: 10 U/L (ref 0–55)
AST: 23 U/L (ref 5–34)
Albumin: 3.8 g/dL (ref 3.5–5.0)
Alkaline Phosphatase: 61 U/L (ref 40–150)
Anion Gap: 6 mEq/L (ref 3–11)
BUN: 14.4 mg/dL (ref 7.0–26.0)
CHLORIDE: 100 meq/L (ref 98–109)
CO2: 29 mEq/L (ref 22–29)
Calcium: 9.2 mg/dL (ref 8.4–10.4)
Creatinine: 0.9 mg/dL (ref 0.6–1.1)
EGFR: 65 mL/min/{1.73_m2} — AB (ref >90)
GLUCOSE: 90 mg/dL (ref 70–140)
POTASSIUM: 4.8 meq/L (ref 3.5–5.1)
SODIUM: 135 meq/L — AB (ref 136–145)
Total Bilirubin: 0.47 mg/dL (ref 0.20–1.20)
Total Protein: 7.6 g/dL (ref 6.4–8.3)

## 2016-02-02 NOTE — Progress Notes (Signed)
Patient Care Team: [name], MD as PCP - General (Internal Medicine)  DIAGNOSIS: metastatic breast cancer  SUMMARY OF ONCOLOGIC HISTORY:   Breast cancer of lower-outer quadrant of right female breast ([place])   01/04/1995 Surgery Right breast cancer status post lumpectomy followed by chemotherapy followed by radiation and tamoxifen 5 years (details are not available)   12/30/2015 Relapse/Recurrence Chest wall/ soft tissue masses near the right side of the sternum, superior nodule measuring 14.5 mm, inferior nodule measuring 10.5 mm, 26 mm anterior mediastinal soft tissue mass, T9 bone metastases   12/30/2015 Procedure Thoracentesis: 400 mL removed; showed reactive mesothelial cells, no cancer cells   12/30/2015 Imaging CT chest: 26 x 17 mm anterior mediastinal soft tissue mass, mixed lytic and sclerotic bone mets mainly involving sternum and T9 vertebral body, scattered vertebral body involvement and rib involvement; chest wall soft tissue masses 14.5 mm, 10.5 mm   01/13/2016 Initial Biopsy Right chest wall mass biopsy 2:30 position: Invasive ductal carcinoma focally involves skeletal muscle, grade 2, HER-2 negative ratio 1.08, ER/PR 95% positive   01/14/2016 PET scan Widespread metastatic disease right breast, right chest wall, mediastinum, right internal mammary lymph nodes, right pleural space, malignant pleural effusion, right common iliac lymph node, bone metastases in the thorax    CHIEF COMPLIANT: follow-up Ibrance with letrozole  INTERVAL HISTORY: Jessica Zavala is a 70 year old with above-mentioned history metastatic breast cancer who is currently on Ibrance with letrozole. She has been on it for the past 2 weeks appears to have tolerated fairly well. She does not notice any fatigue or nausea or diarrhea. She does not have any hot flashes or myalgias related to letrozole.  REVIEW OF SYSTEMS:   Constitutional: Denies fevers, chills or abnormal weight loss Eyes: Denies blurriness of  vision Ears, nose, mouth, throat, and face: Denies mucositis or sore throat Respiratory: Denies cough, dyspnea or wheezes Cardiovascular: Denies palpitation, chest discomfort Gastrointestinal:  Denies nausea, heartburn or change in bowel habits Skin: Denies abnormal skin rashes Lymphatics: Denies new lymphadenopathy or easy bruising Neurological:Denies numbness, tingling or new weaknesses Behavioral/Psych: Mood is stable, no new changes  Extremities: No lower extremity edema  All other systems were reviewed with the patient and are negative.  I have reviewed the past medical history, past surgical history, social history and family history with the patient and they are unchanged from previous note.  ALLERGIES:  is allergic to aspirin.  MEDICATIONS:  Current Outpatient Prescriptions  Medication Sig Dispense Refill  . acetaminophen (TYLENOL) 325 MG tablet Take 650 mg by mouth every 6 (six) hours as needed for mild pain.    Marland Kitchen labetalol (NORMODYNE) 200 MG tablet Take 200 mg by mouth 2 (two) times daily.    Marland Kitchen letrozole (FEMARA) 2.5 MG tablet Take 1 tablet (2.5 mg total) by mouth daily. 90 tablet 3  . losartan (COZAAR) 100 MG tablet     . olmesartan (BENICAR) 40 MG tablet Take 40 mg by mouth daily.    . Omega-3 Fatty Acids (FISH OIL PO) Take 1 capsule by mouth daily.    . palbociclib (IBRANCE) 125 MG capsule Take 1 capsule (125 mg total) by mouth daily with breakfast. Take whole with food for 21 days, then take 7 days off. 21 capsule 0  . simvastatin (ZOCOR) 40 MG tablet Take 40 mg by mouth daily.    . cromolyn (OPTICROM) 4 % ophthalmic solution Place 1 drop into the right eye 4 (four) times daily. Reported on 02/02/2016    .  fluorometholone (FML) 0.1 % ophthalmic suspension Reported on 02/02/2016    . Menthol, Topical Analgesic, (BIOFREEZE COLORLESS) 4 % GEL Use in affected area TID prn (Patient not taking: Reported on 02/02/2016) 100 mL 0  . phenazopyridine (PYRIDIUM) 200 MG tablet Take 1  tablet (200 mg total) by mouth 3 (three) times daily. (Patient not taking: Reported on 02/02/2016) 6 tablet 0   No current facility-administered medications for this visit.    PHYSICAL EXAMINATION: ECOG PERFORMANCE STATUS: 1 - Symptomatic but completely ambulatory  Filed Vitals:   02/02/16 1411  BP: 184/84  Pulse: 64  Temp: 98 F (36.7 C)  Resp: 17   Filed Weights   02/02/16 1411  Weight: 116 lb 6.4 oz (52.799 kg)    GENERAL:alert, no distress and comfortable SKIN: skin color, texture, turgor are normal, no rashes or significant lesions EYES: normal, Conjunctiva are pink and non-injected, sclera clear OROPHARYNX:no exudate, no erythema and lips, buccal mucosa, and tongue normal  NECK: supple, thyroid normal size, non-tender, without nodularity LYMPH:  no palpable lymphadenopathy in the cervical, axillary or inguinal LUNGS: clear to auscultation and percussion with normal breathing effort HEART: regular rate & rhythm and no murmurs and no lower extremity edema ABDOMEN:abdomen soft, non-tender and normal bowel sounds MUSCULOSKELETAL:no cyanosis of digits and no clubbing  NEURO: alert & oriented x 3 with fluent speech, no focal motor/sensory deficits EXTREMITIES: No lower extremity edema  LABORATORY DATA:  I have reviewed the data as listed   Chemistry      Component Value Date/Time   NA 136 12/20/2015 0509   K 3.8 12/20/2015 0509   CL 102 12/20/2015 0509   CO2 27 12/20/2015 0509   BUN 19 12/20/2015 0509   CREATININE 0.71 12/20/2015 0509   CREATININE 0.66 10/08/2015 0954      Component Value Date/Time   CALCIUM 9.3 12/20/2015 0509   ALKPHOS 63 10/08/2015 0954   AST 20 10/08/2015 0954   ALT 10 10/08/2015 0954   BILITOT 0.5 10/08/2015 0954       Lab Results  Component Value Date   WBC 2.8* 02/02/2016   HGB 11.7 02/02/2016   HCT 35.0 02/02/2016   MCV 91.0 02/02/2016   PLT 230 02/02/2016   NEUTROABS 1.1* 02/02/2016     ASSESSMENT & PLAN:  Breast cancer of  lower-outer quadrant of right female breast ([place]) patient was interviewed with the help of a intrepretor Metastatic breast cancer PET-CT 01/14/16: Widespread metastatic disease right breast, right chest wall several nodules largest 3.7 cm, mediastinum, right internal mammary lymph node 2.2 cm, right pleural-based nodules 2.1cm and 2.7 cm, malignant pleural effusion, right common iliac lymph node, bone metastases in the thorax (sternum, manubrium, multiple thoracic vertebral bodies, multiple right-sided ribs)  Right chest wall mass biopsy 2:30 position 01/13/2016: Invasive ductal carcinoma focally involves skeletal muscle, grade 2, HER-2 negative ratio 1.08, ER/PR pending (Right breast cancer 1996 status post lumpectomy followed by chemotherapy followed by radiation and tamoxifen 5 years (details are not available)) ---------------------------------------------------------------------------------------------------------------------------------------------------------- Radiology and pathology review: I discussed with the patient the details of the pathology report as well as the PET/CT scan negative reviewed the films with the patient.  Treatment plan: Ibrance with letrozole started 01/20/2016 Ibrance toxicities: 1. Neutropenia: Today's ANC is 1100. I recommended continuation of therapy. Denies any hot flashes or myalgias related to letrozole.  Bone metastases: I recommended X Geva every 4 weeks to start on 02/18/2016. Patient currently takes calcium with vitamin D Hypertension: Instructed the patient to follow  with herprimary care physician  RTC in 2 weeks for lab check and to start cycle 2 of treatment on 02/18/2016.  No orders of the defined types were placed in this encounter.   The patient has a good understanding of the overall plan. she agrees with it. she will call with any problems that may develop before the next visit here.   Rulon Eisenmenger, MD 02/02/2016

## 2016-02-02 NOTE — Telephone Encounter (Signed)
Gave patient avs report and appointments for March and April.  °

## 2016-02-05 ENCOUNTER — Encounter: Payer: Self-pay | Admitting: Hematology and Oncology

## 2016-02-05 NOTE — Progress Notes (Signed)
Spoke w/ pt's son [name], regarding copay assistance and the [employer].  He was driving at the time so he will call me back on Monday, 02/08/16 with his parent's income info.  I want to apply to PAF and the [employer] for her.  I emailed her son my contact info so he can get in touch with me with the needed info.

## 2016-02-17 NOTE — Assessment & Plan Note (Signed)
Metastatic breast cancer PET-CT 01/14/16: Widespread metastatic disease right breast, right chest wall several nodules largest 3.7 cm, mediastinum, right internal mammary lymph node 2.2 cm, right pleural-based nodules 2.1cm and 2.7 cm, malignant pleural effusion, right common iliac lymph node, bone metastases in the thorax (sternum, manubrium, multiple thoracic vertebral bodies, multiple right-sided ribs)  Right chest wall mass biopsy 2:30 position 01/13/2016: Invasive ductal carcinoma focally involves skeletal muscle, grade 2, HER-2 negative ratio 1.08, ER/PR pending (Right breast cancer 1996 status post lumpectomy followed by chemotherapy followed by radiation and tamoxifen 5 years (details are not available)) ---------------------------------------------------------------------------------------------------------------------------------------------------------- Radiology and pathology review: I discussed with the patient the details of the pathology report as well as the PET/CT scan negative reviewed the films with the patient.  Treatment plan: Ibrance with letrozole started 01/20/2016 Ibrance toxicities: 1. Neutropenia: Today's ANC is 1100. I recommended continuation of therapy. Denies any hot flashes or myalgias related to letrozole.  Bone metastases: I recommended X Geva every 4 weeks to start on 02/18/2016. Patient currently takes calcium with vitamin D Hypertension: Instructed the patient to follow with herprimary care physician  RTC in 4 weeks for lab check and to start cycle 3

## 2016-02-18 ENCOUNTER — Ambulatory Visit: Payer: Commercial Managed Care - HMO

## 2016-02-18 ENCOUNTER — Encounter: Payer: Self-pay | Admitting: Hematology and Oncology

## 2016-02-18 ENCOUNTER — Other Ambulatory Visit (HOSPITAL_BASED_OUTPATIENT_CLINIC_OR_DEPARTMENT_OTHER): Payer: Commercial Managed Care - HMO

## 2016-02-18 ENCOUNTER — Ambulatory Visit (HOSPITAL_BASED_OUTPATIENT_CLINIC_OR_DEPARTMENT_OTHER): Payer: Commercial Managed Care - HMO | Admitting: Hematology and Oncology

## 2016-02-18 ENCOUNTER — Telehealth: Payer: Self-pay | Admitting: Hematology and Oncology

## 2016-02-18 VITALS — BP 140/68 | HR 73 | Temp 97.8°F | Resp 18 | Ht <= 58 in | Wt 116.2 lb

## 2016-02-18 DIAGNOSIS — C50511 Malignant neoplasm of lower-outer quadrant of right female breast: Secondary | ICD-10-CM

## 2016-02-18 DIAGNOSIS — D701 Agranulocytosis secondary to cancer chemotherapy: Secondary | ICD-10-CM | POA: Diagnosis not present

## 2016-02-18 DIAGNOSIS — Z853 Personal history of malignant neoplasm of breast: Secondary | ICD-10-CM

## 2016-02-18 DIAGNOSIS — Z79811 Long term (current) use of aromatase inhibitors: Secondary | ICD-10-CM | POA: Diagnosis not present

## 2016-02-18 DIAGNOSIS — C7951 Secondary malignant neoplasm of bone: Secondary | ICD-10-CM

## 2016-02-18 LAB — CBC WITH DIFFERENTIAL/PLATELET
BASO%: 0.5 % (ref 0.0–2.0)
Basophils Absolute: 0 10*3/uL (ref 0.0–0.1)
EOS ABS: 0.1 10*3/uL (ref 0.0–0.5)
EOS%: 2.5 % (ref 0.0–7.0)
HCT: 32.4 % — ABNORMAL LOW (ref 34.8–46.6)
HEMOGLOBIN: 11.3 g/dL — AB (ref 11.6–15.9)
LYMPH%: 74.2 % — ABNORMAL HIGH (ref 14.0–49.7)
MCH: 31.3 pg (ref 25.1–34.0)
MCHC: 34.9 g/dL (ref 31.5–36.0)
MCV: 89.8 fL (ref 79.5–101.0)
MONO#: 0.2 10*3/uL (ref 0.1–0.9)
MONO%: 10.1 % (ref 0.0–14.0)
NEUT%: 12.7 % — ABNORMAL LOW (ref 38.4–76.8)
NEUTROS ABS: 0.3 10*3/uL — AB (ref 1.5–6.5)
PLATELETS: 85 10*3/uL — AB (ref 145–400)
RBC: 3.61 10*6/uL — ABNORMAL LOW (ref 3.70–5.45)
RDW: 12.7 % (ref 11.2–14.5)
WBC: 2 10*3/uL — AB (ref 3.9–10.3)
lymph#: 1.5 10*3/uL (ref 0.9–3.3)

## 2016-02-18 LAB — COMPREHENSIVE METABOLIC PANEL
ALBUMIN: 3.7 g/dL (ref 3.5–5.0)
ALK PHOS: 59 U/L (ref 40–150)
ALT: 13 U/L (ref 0–55)
ANION GAP: 7 meq/L (ref 3–11)
AST: 20 U/L (ref 5–34)
BILIRUBIN TOTAL: 0.45 mg/dL (ref 0.20–1.20)
BUN: 10.5 mg/dL (ref 7.0–26.0)
CALCIUM: 9.3 mg/dL (ref 8.4–10.4)
CO2: 30 mEq/L — ABNORMAL HIGH (ref 22–29)
Chloride: 101 mEq/L (ref 98–109)
Creatinine: 0.8 mg/dL (ref 0.6–1.1)
EGFR: 73 mL/min/{1.73_m2} — AB (ref >90)
Glucose: 93 mg/dl (ref 70–140)
Potassium: 4.8 mEq/L (ref 3.5–5.1)
Sodium: 138 mEq/L (ref 136–145)
TOTAL PROTEIN: 7.6 g/dL (ref 6.4–8.3)

## 2016-02-18 MED ORDER — LETROZOLE 2.5 MG PO TABS: 2.5000 mg | ORAL_TABLET | Freq: Every day | ORAL | Status: DC

## 2016-02-18 NOTE — Progress Notes (Signed)
Patient has critical lab- ANC 0.3 Patient to receive Xgeva Injection next week per Dr. Lindi Adie Patient to repeat CBC next week

## 2016-02-18 NOTE — Telephone Encounter (Signed)
appt made per 3/16 pof. avs printed

## 2016-02-18 NOTE — Progress Notes (Signed)
Patient Care Team: [name], MD as PCP - General (Internal Medicine)  DIAGNOSIS: Metastatic breast cancer  SUMMARY OF ONCOLOGIC HISTORY:   Breast cancer of lower-outer quadrant of right female breast ([place])   01/04/1995 Surgery Right breast cancer status post lumpectomy followed by chemotherapy followed by radiation and tamoxifen 5 years (details are not available)   12/30/2015 Relapse/Recurrence Chest wall/ soft tissue masses near the right side of the sternum, superior nodule measuring 14.5 mm, inferior nodule measuring 10.5 mm, 26 mm anterior mediastinal soft tissue mass, T9 bone metastases   12/30/2015 Procedure Thoracentesis: 400 mL removed; showed reactive mesothelial cells, no cancer cells   12/30/2015 Imaging CT chest: 26 x 17 mm anterior mediastinal soft tissue mass, mixed lytic and sclerotic bone mets mainly involving sternum and T9 vertebral body, scattered vertebral body involvement and rib involvement; chest wall soft tissue masses 14.5 mm, 10.5 mm   01/13/2016 Initial Biopsy Right chest wall mass biopsy 2:30 position: Invasive ductal carcinoma focally involves skeletal muscle, grade 2, HER-2 negative ratio 1.08, ER/PR 95% positive   01/14/2016 PET scan Widespread metastatic disease right breast, right chest wall, mediastinum, right internal mammary lymph nodes, right pleural space, malignant pleural effusion, right common iliac lymph node, bone metastases in the thorax   01/21/2016 -  Anti-estrogen oral therapy Ibrance with letrozole    CHIEF COMPLIANT: Follow-up to review blood work on [employer] and letrozole  INTERVAL HISTORY: Jessica Zavala is a 70 year old with above-mentioned history of metastatic breast cancer with widespread bony metastases who was started on Ibrance with letrozole. She reports that she does not have any side effects to treatment. Previously noticed bone pain has remarkably improved.  REVIEW OF SYSTEMS:   Constitutional: Denies fevers, chills or abnormal weight  loss Eyes: Denies blurriness of vision Ears, nose, mouth, throat, and face: Denies mucositis or sore throat Respiratory: Denies cough, dyspnea or wheezes Cardiovascular: Denies palpitation, chest discomfort Gastrointestinal:  Denies nausea, heartburn or change in bowel habits Skin: Denies abnormal skin rashes Lymphatics: Denies new lymphadenopathy or easy bruising Neurological:Denies numbness, tingling or new weaknesses Behavioral/Psych: Mood is stable, no new changes  Extremities: Left thigh pain radiating down the leg. Breast:  denies any pain or lumps or nodules in either breasts All other systems were reviewed with the patient and are negative.  I have reviewed the past medical history, past surgical history, social history and family history with the patient and they are unchanged from previous note.  ALLERGIES:  is allergic to aspirin.  MEDICATIONS:  Current Outpatient Prescriptions  Medication Sig Dispense Refill  . acetaminophen (TYLENOL) 325 MG tablet Take 650 mg by mouth every 6 (six) hours as needed for mild pain.    . cromolyn (OPTICROM) 4 % ophthalmic solution Place 1 drop into the right eye 4 (four) times daily. Reported on 02/02/2016    . fluorometholone (FML) 0.1 % ophthalmic suspension Reported on 02/02/2016    . labetalol (NORMODYNE) 200 MG tablet Take 200 mg by mouth 2 (two) times daily.    Marland Kitchen letrozole (FEMARA) 2.5 MG tablet Take 1 tablet (2.5 mg total) by mouth daily. 90 tablet 3  . losartan (COZAAR) 100 MG tablet     . Menthol, Topical Analgesic, (BIOFREEZE COLORLESS) 4 % GEL Use in affected area TID prn (Patient not taking: Reported on 02/02/2016) 100 mL 0  . olmesartan (BENICAR) 40 MG tablet Take 40 mg by mouth daily.    . Omega-3 Fatty Acids (FISH OIL PO) Take 1 capsule by  mouth daily.    . palbociclib (IBRANCE) 125 MG capsule Take 1 capsule (125 mg total) by mouth daily with breakfast. Take whole with food for 21 days, then take 7 days off. 21 capsule 0  .  phenazopyridine (PYRIDIUM) 200 MG tablet Take 1 tablet (200 mg total) by mouth 3 (three) times daily. (Patient not taking: Reported on 02/02/2016) 6 tablet 0  . simvastatin (ZOCOR) 40 MG tablet Take 40 mg by mouth daily.     No current facility-administered medications for this visit.    PHYSICAL EXAMINATION: ECOG PERFORMANCE STATUS: 1 - Symptomatic but completely ambulatory  Filed Vitals:   02/18/16 0836  BP: 140/68  Pulse: 73  Temp: 97.8 F (36.6 C)  Resp: 18   Filed Weights   02/18/16 0836  Weight: 116 lb 3.2 oz (52.708 kg)    GENERAL:alert, no distress and comfortable SKIN: skin color, texture, turgor are normal, no rashes or significant lesions EYES: normal, Conjunctiva are pink and non-injected, sclera clear OROPHARYNX:no exudate, no erythema and lips, buccal mucosa, and tongue normal  NECK: supple, thyroid normal size, non-tender, without nodularity LYMPH:  no palpable lymphadenopathy in the cervical, axillary or inguinal LUNGS: clear to auscultation and percussion with normal breathing effort HEART: regular rate & rhythm and no murmurs and no lower extremity edema ABDOMEN:abdomen soft, non-tender and normal bowel sounds MUSCULOSKELETAL:no cyanosis of digits and no clubbing  NEURO: alert & oriented x 3 with fluent speech, no focal motor/sensory deficits EXTREMITIES: No lower extremity edema  LABORATORY DATA:  I have reviewed the data as listed   Chemistry      Component Value Date/Time   NA 138 02/18/2016 0807   NA 136 12/20/2015 0509   K 4.8 02/18/2016 0807   K 3.8 12/20/2015 0509   CL 102 12/20/2015 0509   CO2 30* 02/18/2016 0807   CO2 27 12/20/2015 0509   BUN 10.5 02/18/2016 0807   BUN 19 12/20/2015 0509   CREATININE 0.8 02/18/2016 0807   CREATININE 0.71 12/20/2015 0509   CREATININE 0.66 10/08/2015 0954      Component Value Date/Time   CALCIUM 9.3 02/18/2016 0807   CALCIUM 9.3 12/20/2015 0509   ALKPHOS 59 02/18/2016 0807   ALKPHOS 63 10/08/2015 0954     AST 20 02/18/2016 0807   AST 20 10/08/2015 0954   ALT 13 02/18/2016 0807   ALT 10 10/08/2015 0954   BILITOT 0.45 02/18/2016 0807   BILITOT 0.5 10/08/2015 0954       Lab Results  Component Value Date   WBC 2.0* 02/18/2016   HGB 11.3* 02/18/2016   HCT 32.4* 02/18/2016   MCV 89.8 02/18/2016   PLT 85* 02/18/2016   NEUTROABS 0.3* 02/18/2016     ASSESSMENT & PLAN:  Breast cancer of lower-outer quadrant of right female breast ([place]) Metastatic breast cancer PET-CT 01/14/16: Widespread metastatic disease right breast, right chest wall several nodules largest 3.7 cm, mediastinum, right internal mammary lymph node 2.2 cm, right pleural-based nodules 2.1cm and 2.7 cm, malignant pleural effusion, right common iliac lymph node, bone metastases in the thorax (sternum, manubrium, multiple thoracic vertebral bodies, multiple right-sided ribs)  Right chest wall mass biopsy 2:30 position 01/13/2016: Invasive ductal carcinoma focally involves skeletal muscle, grade 2, HER-2 negative ratio 1.08, ER/PR pending (Right breast cancer 1996 status post lumpectomy followed by chemotherapy followed by radiation and tamoxifen 5 years (details are not available)) ---------------------------------------------------------------------------------------------------------------------------------------------------------- Radiology and pathology review: I discussed with the patient the details of the pathology report as well  as the PET/CT scan negative reviewed the films with the patient.  Treatment plan: Ibrance with letrozole started 01/20/2016 Ibrance toxicities: 1. Neutropenia: Today's ANC is 300. I recommended holding resuming Ibrance. We will recheck her blood counts next week and if the counts are okay, we will then start back on Ibrance at a lower dosage of 100 mg. Denies any hot flashes or myalgias related to letrozole.  Bone metastases: Delton See will start next week. Patient currently takes calcium with  vitamin D Hypertension: Instructed the patient to follow with herprimary care physician  RTC in 1 weeks for lab check and to start cycle 2   Orders Placed This Encounter  Procedures  . CBC with Differential    Standing Status: Standing     Number of Occurrences: 20     Standing Expiration Date: 02/17/2017   The patient has a good understanding of the overall plan. she agrees with it. she will call with any problems that may develop before the next visit here.   Rulon Eisenmenger, MD 02/18/2016

## 2016-02-19 ENCOUNTER — Encounter: Payer: Self-pay | Admitting: Family Medicine

## 2016-02-19 ENCOUNTER — Encounter: Payer: Self-pay | Admitting: Hematology and Oncology

## 2016-02-19 NOTE — Progress Notes (Signed)
Attempted to enroll pt in the [facility] but per the website pt was an existing pt so I called to obtain the approval letter & POE.  According to the rep, pt was enrolled by Dr. Mechele Dawley office therefore couldn't send out any info on the pt until she or a family member gives consent to send me the letter.  I left a message for pt's son [name] to return my call so I can relay this info to him.  I did approve pt for the $1000 [employer].  I will give her an expense sheet on 02/25/16.

## 2016-02-24 NOTE — Assessment & Plan Note (Signed)
Metastatic breast cancer PET-CT 01/14/16: Widespread metastatic disease right breast, right chest wall several nodules largest 3.7 cm, mediastinum, right internal mammary lymph node 2.2 cm, right pleural-based nodules 2.1cm and 2.7 cm, malignant pleural effusion, right common iliac lymph node, bone metastases in the thorax (sternum, manubrium, multiple thoracic vertebral bodies, multiple right-sided ribs)  Right chest wall mass biopsy 2:30 position 01/13/2016: Invasive ductal carcinoma focally involves skeletal muscle, grade 2, HER-2 negative ratio 1.08, ER/PR pending (Right breast cancer 1996 status post lumpectomy followed by chemotherapy followed by radiation and tamoxifen 5 years (details are not available)) ---------------------------------------------------------------------------------------------------------------------------------------------------------- Radiology and pathology review: I discussed with the patient the details of the pathology report as well as the PET/CT scan negative reviewed the films with the patient.  Treatment plan: Ibrance with letrozole started 01/20/2016 Ibrance toxicities: 1. Neutropenia: Today's ANC is 300. I recommended holding resuming Ibrance. We will recheck her blood counts next week and if the counts are okay, we will then start back on Ibrance at a lower dosage of 100 mg. Denies any hot flashes or myalgias related to letrozole.  Bone metastases: Delton See will start next week. Patient currently takes calcium with vitamin D Hypertension: Instructed the patient to follow with herprimary care physician  RTC in 2 weeks for lab check and to start cycle 3

## 2016-02-25 ENCOUNTER — Encounter: Payer: Self-pay | Admitting: Hematology and Oncology

## 2016-02-25 ENCOUNTER — Ambulatory Visit (HOSPITAL_BASED_OUTPATIENT_CLINIC_OR_DEPARTMENT_OTHER): Payer: Commercial Managed Care - HMO | Admitting: Hematology and Oncology

## 2016-02-25 ENCOUNTER — Telehealth: Payer: Self-pay | Admitting: Hematology and Oncology

## 2016-02-25 ENCOUNTER — Other Ambulatory Visit (HOSPITAL_BASED_OUTPATIENT_CLINIC_OR_DEPARTMENT_OTHER): Payer: Commercial Managed Care - HMO

## 2016-02-25 VITALS — BP 140/70 | HR 76 | Temp 98.2°F | Resp 18 | Ht <= 58 in | Wt 116.4 lb

## 2016-02-25 DIAGNOSIS — C50511 Malignant neoplasm of lower-outer quadrant of right female breast: Secondary | ICD-10-CM

## 2016-02-25 DIAGNOSIS — C7951 Secondary malignant neoplasm of bone: Secondary | ICD-10-CM

## 2016-02-25 DIAGNOSIS — C7989 Secondary malignant neoplasm of other specified sites: Secondary | ICD-10-CM | POA: Diagnosis not present

## 2016-02-25 DIAGNOSIS — I10 Essential (primary) hypertension: Secondary | ICD-10-CM

## 2016-02-25 DIAGNOSIS — Z853 Personal history of malignant neoplasm of breast: Secondary | ICD-10-CM

## 2016-02-25 DIAGNOSIS — Z79811 Long term (current) use of aromatase inhibitors: Secondary | ICD-10-CM

## 2016-02-25 LAB — CBC WITH DIFFERENTIAL/PLATELET
BASO%: 1.4 % (ref 0.0–2.0)
BASOS ABS: 0 10*3/uL (ref 0.0–0.1)
EOS ABS: 0.1 10*3/uL (ref 0.0–0.5)
EOS%: 4.5 % (ref 0.0–7.0)
HEMATOCRIT: 35.4 % (ref 34.8–46.6)
HEMOGLOBIN: 11.7 g/dL (ref 11.6–15.9)
LYMPH#: 1.5 10*3/uL (ref 0.9–3.3)
LYMPH%: 53 % — ABNORMAL HIGH (ref 14.0–49.7)
MCH: 30.5 pg (ref 25.1–34.0)
MCHC: 33.1 g/dL (ref 31.5–36.0)
MCV: 92.3 fL (ref 79.5–101.0)
MONO#: 0.5 10*3/uL (ref 0.1–0.9)
MONO%: 17.1 % — ABNORMAL HIGH (ref 0.0–14.0)
NEUT#: 0.7 10*3/uL — ABNORMAL LOW (ref 1.5–6.5)
NEUT%: 24 % — ABNORMAL LOW (ref 38.4–76.8)
Platelets: 306 10*3/uL (ref 145–400)
RBC: 3.84 10*6/uL (ref 3.70–5.45)
RDW: 13.5 % (ref 11.2–14.5)
WBC: 2.8 10*3/uL — ABNORMAL LOW (ref 3.9–10.3)

## 2016-02-25 NOTE — Progress Notes (Signed)
Patient Care Team: [name], MD as PCP - General (Internal Medicine)  DIAGNOSIS: No matching staging information was found for the patient.  SUMMARY OF ONCOLOGIC HISTORY:   Breast cancer of lower-outer quadrant of right female breast ([place])   01/04/1995 Surgery Right breast cancer status post lumpectomy followed by chemotherapy followed by radiation and tamoxifen 5 years (details are not available)   12/30/2015 Relapse/Recurrence Chest wall/ soft tissue masses near the right side of the sternum, superior nodule measuring 14.5 mm, inferior nodule measuring 10.5 mm, 26 mm anterior mediastinal soft tissue mass, T9 bone metastases   12/30/2015 Procedure Thoracentesis: 400 mL removed; showed reactive mesothelial cells, no cancer cells   12/30/2015 Imaging CT chest: 26 x 17 mm anterior mediastinal soft tissue mass, mixed lytic and sclerotic bone mets mainly involving sternum and T9 vertebral body, scattered vertebral body involvement and rib involvement; chest wall soft tissue masses 14.5 mm, 10.5 mm   01/13/2016 Initial Biopsy Right chest wall mass biopsy 2:30 position: Invasive ductal carcinoma focally involves skeletal muscle, grade 2, HER-2 negative ratio 1.08, ER/PR 95% positive   01/14/2016 PET scan Widespread metastatic disease right breast, right chest wall, mediastinum, right internal mammary lymph nodes, right pleural space, malignant pleural effusion, right common iliac lymph node, bone metastases in the thorax   01/21/2016 -  Anti-estrogen oral therapy Ibrance with letrozole    CHIEF COMPLIANT: Follow-up to review blood work on [employer] with letrozole  INTERVAL HISTORY: Jessica Zavala is a 70 year old with above-mentioned history of metastatic breast cancer with widespread metastatic disease involving the right breast mediastinum internal mammary nodes as well as lungs and along with iliac lymph nodes and bone metastases. She started Ibrance with letrozole and was found to be neutropenic and we  could not initiate cycle 2 of treatment. It was delayed by a week from today. Even today her [place] is only 700. So we will have to delay her treatment and other week. Overall her symptoms are markedly improved. She has very good appetite and eating well. She did not have any side effects to the treatment so far.  REVIEW OF SYSTEMS:   Constitutional: Denies fevers, chills or abnormal weight loss Eyes: Denies blurriness of vision Ears, nose, mouth, throat, and face: Denies mucositis or sore throat Respiratory: Denies cough, dyspnea or wheezes Cardiovascular: Denies palpitation, chest discomfort Gastrointestinal:  Denies nausea, heartburn or change in bowel habits Skin: Denies abnormal skin rashes Lymphatics: Denies new lymphadenopathy or easy bruising Neurological:Denies numbness, tingling or new weaknesses Behavioral/Psych: Mood is stable, no new changes  Extremities: No lower extremity edema  All other systems were reviewed with the patient and are negative.  I have reviewed the past medical history, past surgical history, social history and family history with the patient and they are unchanged from previous note.  ALLERGIES:  is allergic to aspirin.  MEDICATIONS:  Current Outpatient Prescriptions  Medication Sig Dispense Refill  . acetaminophen (TYLENOL) 325 MG tablet Take 650 mg by mouth every 6 (six) hours as needed for mild pain.    . cromolyn (OPTICROM) 4 % ophthalmic solution Place 1 drop into the right eye 4 (four) times daily. Reported on 02/02/2016    . fluorometholone (FML) 0.1 % ophthalmic suspension Reported on 02/02/2016    . labetalol (NORMODYNE) 200 MG tablet Take 200 mg by mouth 2 (two) times daily.    Marland Kitchen letrozole (FEMARA) 2.5 MG tablet Take 1 tablet (2.5 mg total) by mouth daily. 90 tablet 3  . losartan (  COZAAR) 100 MG tablet     . Menthol, Topical Analgesic, (BIOFREEZE COLORLESS) 4 % GEL Use in affected area TID prn (Patient not taking: Reported on 02/02/2016) 100 mL 0  .  olmesartan (BENICAR) 40 MG tablet Take 40 mg by mouth daily.    . Omega-3 Fatty Acids (FISH OIL PO) Take 1 capsule by mouth daily.    . palbociclib (IBRANCE) 125 MG capsule Take 1 capsule (125 mg total) by mouth daily with breakfast. Take whole with food for 21 days, then take 7 days off. 21 capsule 0  . phenazopyridine (PYRIDIUM) 200 MG tablet Take 1 tablet (200 mg total) by mouth 3 (three) times daily. (Patient not taking: Reported on 02/02/2016) 6 tablet 0  . simvastatin (ZOCOR) 40 MG tablet Take 40 mg by mouth daily.     No current facility-administered medications for this visit.    PHYSICAL EXAMINATION: ECOG PERFORMANCE STATUS: 1 - Symptomatic but completely ambulatory  Filed Vitals:   02/25/16 0844  BP: 140/70  Pulse: 76  Temp: 98.2 F (36.8 C)  Resp: 18   Filed Weights   02/25/16 0844  Weight: 116 lb 6.4 oz (52.799 kg)    GENERAL:alert, no distress and comfortable SKIN: skin color, texture, turgor are normal, no rashes or significant lesions EYES: normal, Conjunctiva are pink and non-injected, sclera clear OROPHARYNX:no exudate, no erythema and lips, buccal mucosa, and tongue normal  NECK: supple, thyroid normal size, non-tender, without nodularity LYMPH:  no palpable lymphadenopathy in the cervical, axillary or inguinal LUNGS: clear to auscultation and percussion with normal breathing effort HEART: regular rate & rhythm and no murmurs and no lower extremity edema ABDOMEN:abdomen soft, non-tender and normal bowel sounds MUSCULOSKELETAL:no cyanosis of digits and no clubbing  NEURO: alert & oriented x 3 with fluent speech, no focal motor/sensory deficits EXTREMITIES: No lower extremity edema   LABORATORY DATA:  I have reviewed the data as listed   Chemistry      Component Value Date/Time   NA 138 02/18/2016 0807   NA 136 12/20/2015 0509   K 4.8 02/18/2016 0807   K 3.8 12/20/2015 0509   CL 102 12/20/2015 0509   CO2 30* 02/18/2016 0807   CO2 27 12/20/2015 0509    BUN 10.5 02/18/2016 0807   BUN 19 12/20/2015 0509   CREATININE 0.8 02/18/2016 0807   CREATININE 0.71 12/20/2015 0509   CREATININE 0.66 10/08/2015 0954      Component Value Date/Time   CALCIUM 9.3 02/18/2016 0807   CALCIUM 9.3 12/20/2015 0509   ALKPHOS 59 02/18/2016 0807   ALKPHOS 63 10/08/2015 0954   AST 20 02/18/2016 0807   AST 20 10/08/2015 0954   ALT 13 02/18/2016 0807   ALT 10 10/08/2015 0954   BILITOT 0.45 02/18/2016 0807   BILITOT 0.5 10/08/2015 0954       Lab Results  Component Value Date   WBC 2.8* 02/25/2016   HGB 11.7 02/25/2016   HCT 35.4 02/25/2016   MCV 92.3 02/25/2016   PLT 306 02/25/2016   NEUTROABS 0.7* 02/25/2016     ASSESSMENT & PLAN:  Breast cancer of lower-outer quadrant of right female breast ([place]) Metastatic breast cancer PET-CT 01/14/16: Widespread metastatic disease right breast, right chest wall several nodules largest 3.7 cm, mediastinum, right internal mammary lymph node 2.2 cm, right pleural-based nodules 2.1cm and 2.7 cm, malignant pleural effusion, right common iliac lymph node, bone metastases in the thorax (sternum, manubrium, multiple thoracic vertebral bodies, multiple right-sided ribs)  Right chest wall  mass biopsy 2:30 position 01/13/2016: Invasive ductal carcinoma focally involves skeletal muscle, grade 2, HER-2 negative ratio 1.08, ER/PR pending (Right breast cancer 1996 status post lumpectomy followed by chemotherapy followed by radiation and tamoxifen 5 years (details are not available)) ---------------------------------------------------------------------------------------------------------------------------------------------------------- Radiology and pathology review: I discussed with the patient the details of the pathology report as well as the PET/CT scan negative reviewed the films with the patient.  Treatment plan: Ibrance with letrozole started 01/20/2016 (holding cycle 2 for low blood counts) Ibrance toxicities: 1.  Neutropenia: Today's ANC is 700. I recommended holding resuming Ibrance. We will recheck her blood counts next week and if the counts are okay, we will then start back on Ibrance at a lower dosage of 100 mg. Denies any hot flashes or myalgias related to letrozole.  Bone metastases: Xgeva q 4 weeks. Patient currently takes calcium with vitamin D Hypertension: Instructed the patient to follow with her primary care physician  RTC in 1 weeks for lab check and to start cycle 2    Orders Placed This Encounter  Procedures  . CBC with Differential    Standing Status: Future     Number of Occurrences:      Standing Expiration Date: 02/24/2017   The patient has a good understanding of the overall plan. she agrees with it. she will call with any problems that may develop before the next visit here.   Rulon Eisenmenger, MD 02/25/2016

## 2016-02-25 NOTE — Telephone Encounter (Signed)
appt made and avs printed °

## 2016-02-25 NOTE — Progress Notes (Signed)
Unable to get in to exam room prior to MD.  No assessment performed.  

## 2016-03-03 NOTE — Assessment & Plan Note (Signed)
Metastatic breast cancer PET-CT 01/14/16: Widespread metastatic disease right breast, right chest wall several nodules largest 3.7 cm, mediastinum, right internal mammary lymph node 2.2 cm, right pleural-based nodules 2.1cm and 2.7 cm, malignant pleural effusion, right common iliac lymph node, bone metastases in the thorax (sternum, manubrium, multiple thoracic vertebral bodies, multiple right-sided ribs)  Right chest wall mass biopsy 2:30 position 01/13/2016: Invasive ductal carcinoma focally involves skeletal muscle, grade 2, HER-2 negative ratio 1.08, ER/PR pending (Right breast cancer 1996 status post lumpectomy followed by chemotherapy followed by radiation and tamoxifen 5 years (details are not available)) ---------------------------------------------------------------------------------------------------------------------------------------------------------- Radiology and pathology review: I discussed with the patient the details of the pathology report as well as the PET/CT scan negative reviewed the films with the patient.  Treatment plan: Ibrance with letrozole started 01/20/2016 (holding cycle 2 for low blood counts) Ibrance toxicities: 1. Neutropenia: Today's ANC is 700. I recommended holding resuming Ibrance. We will recheck her blood counts next week and if the counts are okay, we will then start back on Ibrance at a lower dosage of 100 mg. Denies any hot flashes or myalgias related to letrozole.  Bone metastases: Xgeva q 4 weeks. Patient currently takes calcium with vitamin D Hypertension: Instructed the patient to follow with her primary care physician  RTC in 2 weeks for lab check

## 2016-03-04 ENCOUNTER — Other Ambulatory Visit: Payer: Self-pay | Admitting: Hematology and Oncology

## 2016-03-04 ENCOUNTER — Telehealth: Payer: Self-pay | Admitting: Hematology and Oncology

## 2016-03-04 ENCOUNTER — Ambulatory Visit (HOSPITAL_BASED_OUTPATIENT_CLINIC_OR_DEPARTMENT_OTHER): Payer: Commercial Managed Care - HMO | Admitting: Hematology and Oncology

## 2016-03-04 ENCOUNTER — Other Ambulatory Visit (HOSPITAL_BASED_OUTPATIENT_CLINIC_OR_DEPARTMENT_OTHER): Payer: Commercial Managed Care - HMO

## 2016-03-04 ENCOUNTER — Ambulatory Visit (HOSPITAL_BASED_OUTPATIENT_CLINIC_OR_DEPARTMENT_OTHER): Payer: Commercial Managed Care - HMO

## 2016-03-04 ENCOUNTER — Encounter: Payer: Self-pay | Admitting: Hematology and Oncology

## 2016-03-04 VITALS — BP 147/68 | HR 67 | Temp 98.1°F | Resp 16

## 2016-03-04 VITALS — BP 147/68 | HR 67 | Temp 98.1°F | Resp 16 | Wt 117.5 lb

## 2016-03-04 DIAGNOSIS — C7951 Secondary malignant neoplasm of bone: Secondary | ICD-10-CM

## 2016-03-04 DIAGNOSIS — Z79811 Long term (current) use of aromatase inhibitors: Secondary | ICD-10-CM | POA: Diagnosis not present

## 2016-03-04 DIAGNOSIS — C7989 Secondary malignant neoplasm of other specified sites: Secondary | ICD-10-CM | POA: Diagnosis not present

## 2016-03-04 DIAGNOSIS — C50511 Malignant neoplasm of lower-outer quadrant of right female breast: Secondary | ICD-10-CM

## 2016-03-04 LAB — CBC WITH DIFFERENTIAL/PLATELET
BASO%: 1.3 % (ref 0.0–2.0)
Basophils Absolute: 0.1 10*3/uL (ref 0.0–0.1)
EOS%: 9.5 % — AB (ref 0.0–7.0)
Eosinophils Absolute: 0.5 10*3/uL (ref 0.0–0.5)
HCT: 35.6 % (ref 34.8–46.6)
HGB: 12 g/dL (ref 11.6–15.9)
LYMPH#: 1.7 10*3/uL (ref 0.9–3.3)
LYMPH%: 36.3 % (ref 14.0–49.7)
MCH: 31.3 pg (ref 25.1–34.0)
MCHC: 33.7 g/dL (ref 31.5–36.0)
MCV: 93 fL (ref 79.5–101.0)
MONO#: 0.5 10*3/uL (ref 0.1–0.9)
MONO%: 9.5 % (ref 0.0–14.0)
NEUT#: 2.1 10*3/uL (ref 1.5–6.5)
NEUT%: 43.4 % (ref 38.4–76.8)
Platelets: 321 10*3/uL (ref 145–400)
RBC: 3.83 10*6/uL (ref 3.70–5.45)
RDW: 15.1 % — ABNORMAL HIGH (ref 11.2–14.5)
WBC: 4.7 10*3/uL (ref 3.9–10.3)

## 2016-03-04 MED ORDER — DENOSUMAB 120 MG/1.7ML ~~LOC~~ SOLN
120.0000 mg | Freq: Once | SUBCUTANEOUS | Status: AC
  Administered 2016-03-04: 120 mg via SUBCUTANEOUS
  Filled 2016-03-04: qty 1.7

## 2016-03-04 MED FILL — *IBRANCE 100 MG CAPSULE: 100 | 28 days supply | Qty: 21 | Fill #0

## 2016-03-04 NOTE — Progress Notes (Signed)
Unable to get in to exam room prior to MD.  No assessment performed.  

## 2016-03-04 NOTE — Patient Instructions (Addendum)
Denosumab injection What is this medicine? DENOSUMAB (den oh sue mab) slows bone breakdown. Prolia is used to treat osteoporosis in women after menopause and in men. Xgeva is used to prevent bone fractures and other bone problems caused by cancer bone metastases. Xgeva is also used to treat giant cell tumor of the bone. This medicine may be used for other purposes; ask your health care provider or pharmacist if you have questions. What should I tell my health care provider before I take this medicine? They need to know if you have any of these conditions: -dental disease -eczema -infection or history of infections -kidney disease or on dialysis -low blood calcium or vitamin D -malabsorption syndrome -scheduled to have surgery or tooth extraction -taking medicine that contains denosumab -thyroid or parathyroid disease -an unusual reaction to denosumab, other medicines, foods, dyes, or preservatives -pregnant or trying to get pregnant -breast-feeding How should I use this medicine? This medicine is for injection under the skin. It is given by a health care professional in a hospital or clinic setting. If you are getting Prolia, a special MedGuide will be given to you by the pharmacist with each prescription and refill. Be sure to read this information carefully each time. For Prolia, talk to your pediatrician regarding the use of this medicine in children. Special care may be needed. For Xgeva, talk to your pediatrician regarding the use of this medicine in children. While this drug may be prescribed for children as young as 13 years for selected conditions, precautions do apply. Overdosage: If you think you have taken too much of this medicine contact a poison control center or emergency room at once. NOTE: This medicine is only for you. Do not share this medicine with others. What if I miss a dose? It is important not to miss your dose. Call your doctor or health care professional if you are  unable to keep an appointment. What may interact with this medicine? Do not take this medicine with any of the following medications: -other medicines containing denosumab This medicine may also interact with the following medications: -medicines that suppress the immune system -medicines that treat cancer -steroid medicines like prednisone or cortisone This list may not describe all possible interactions. Give your health care provider a list of all the medicines, herbs, non-prescription drugs, or dietary supplements you use. Also tell them if you smoke, drink alcohol, or use illegal drugs. Some items may interact with your medicine. What should I watch for while using this medicine? Visit your doctor or health care professional for regular checks on your progress. Your doctor or health care professional may order blood tests and other tests to see how you are doing. Call your doctor or health care professional if you get a cold or other infection while receiving this medicine. Do not treat yourself. This medicine may decrease your body's ability to fight infection. You should make sure you get enough calcium and vitamin D while you are taking this medicine, unless your doctor tells you not to. Discuss the foods you eat and the vitamins you take with your health care professional. See your dentist regularly. Brush and floss your teeth as directed. Before you have any dental work done, tell your dentist you are receiving this medicine. Do not become pregnant while taking this medicine or for 5 months after stopping it. Women should inform their doctor if they wish to become pregnant or think they might be pregnant. There is a potential for serious side effects   to an unborn child. Talk to your health care professional or pharmacist for more information. What side effects may I notice from receiving this medicine? Side effects that you should report to your doctor or health care professional as soon as  possible: -allergic reactions like skin rash, itching or hives, swelling of the face, lips, or tongue -breathing problems -chest pain -fast, irregular heartbeat -feeling faint or lightheaded, falls -fever, chills, or any other sign of infection -muscle spasms, tightening, or twitches -numbness or tingling -skin blisters or bumps, or is dry, peels, or red -slow healing or unexplained pain in the mouth or jaw -unusual bleeding or bruising Side effects that usually do not require medical attention (Report these to your doctor or health care professional if they continue or are bothersome.): -muscle pain -stomach upset, gas This list may not describe all possible side effects. Call your doctor for medical advice about side effects. You may report side effects to FDA at 1-800-FDA-1088. Where should I keep my medicine? This medicine is only given in a clinic, doctor's office, or other health care setting and will not be stored at home. NOTE: This sheet is a summary. It may not cover all possible information. If you have questions about this medicine, talk to your doctor, pharmacist, or health care provider.    2016, Elsevier/Gold Standard. (2012-05-21 12:37:47) Denosumab injection ?y l thu?c g? DENOSUMAB lm ch?m s? suy y?u x??ng. Prolia ???c dng ?? ?i?u tr? ch?ng long x??ng ? ph? n? sau khi mn kinh v ? nam gi?i. Xgeva ???c dng ?? phng ng?a gy x??ng v cc v?n ?? khc v? x??ng gy ra b?i di c?n x??ng do ung th?Delton See c?ng ???c dng ?? ?i?u tr? b??u t? bo kh?ng l? (giant cell tumor) c?a x??ng. Thu?c ny c th? ???c dng cho nh?ng m?c ?ch khc; hy h?i ng??i cung c?p d?ch v? y t? ho?c d??c s? c?a mnh, n?u qu v? c th?c m?c. Ti c?n ph?i bo cho ng??i cung c?p d?ch v? y t? c?a mnh ?i?u g tr??c khi dng thu?c ny? H? c?n bi?t li?u qu v? c b?t k? tnh tr?ng no sau ?y khng: -b?nh v? r?ng -chm da -nhi?m trng ho?c ti?n s? nhi?m trng -b?nh th?n ho?c n?u qu v? ?ang ???c th?m  phn -m?c calci ho?c vitamin D trong mu th?p -h?i ch?ng r?i lo?n h?p th? -???c x?p vo l?ch ?? gi?i ph?u ho?c nh? r?ng -?ang du?ng thu?c co? ch??a denosumab -b?nh tuy?n gip tr?ng ho?c tuy?n c?n gip tr?ng -ph?n ?ng b?t th??ng v?i denosumab -pha?n ??ng b?t th???ng ho??c di? ??ng v??i ca?c d??c ph?m kha?c -pha?n ??ng b?t th???ng ho??c di? ??ng v??i th??c ph?m, thu?c nhu?m, ho??c ch?t ba?o qua?n -?ang c thai ho??c ??nh co? thai -?ang cho con bu? Ti nn s? d?ng thu?c ny nh? th? no? Thu?c ny ?? tim d??i da. Thu?c ny ???c s? d?ng b?i chuyn vin y t? ? b?nh vi?n ho?c ? phng m?ch. N?u qu v? s?p dng thu?c Prolia, d??c s? s? ??a cho qu v? m?t B?n H??ng D?n v? D??c Ph?m (MedGuide) ??c bi?t cho m?i toa thu?c v cho m?i l?n mua thm thu?c ?. Hy b?o ??m ??c k? thng tin ny m?i l?n. ??i v?i Prolia, hy bn v?i bc s? nhi khoa c?a qu v? v? vi?c dng thu?c ny ? tr? em. C th? c?n ch?m [place] ??c bi?t. ??i v?i Xgeva, hy bn v?i bc s? nhi khoa c?a qu v? v? vi?c  dng thu?c ny ? tr? em. Thu?c ny c th? ???c k toa cho tr? em ch? m?i 13 tu?i trong nh?ng tr??ng h?p ch?n l?c, nh?ng c?n ph?i th?n tr?ng. Qu li?u: N?u qu v? cho r?ng mnh ? dng qu nhi?u thu?c ny, th hy lin l?c v?i trung tm ki?m sot ch?t ??c ho?c phng c?p c?u ngay l?p t?c. L?U : Thu?c ny ch? dnh ring cho qu v?. Khng chia s? thu?c ny v?i nh?ng ng??i khc. N?u ti l? qun m?t li?u th sao? ?i?u quan tr?ng l khng nn b? l? li?u thu?c no. Hy lin l?c v?i bc s? ho?c [place] vin y t? c?a mnh, n?u qu v? khng th? gi? ?ng cu?c h?n khm. Nh?ng g c th? t??ng tc v?i thu?c ny? Khng ???c dng thu?c ny cng v?i b?t k? th? no sau ?y: -nh??ng thu?c kha?c co? ch??a denosumab Thu?c ny c?ng c th? t??ng tc v?i cc thu?c sau ?y: -cc thu?c ?c ch? h? mi?n d?ch -cc thu?c ?i?u tr? b?nh ung th? -cc thu?c steroid, ch?ng h?n nh? prednisone ho?c cortisone [facility] ny c th? khng m t? ?? h?t cc t??ng tc c  th? x?y ra. Hy ??a cho ng??i cung c?p d?ch v? y t? c?a mnh danh sch t?t c? cc thu?c, th?o d??c, cc thu?c khng c?n toa, ho?c cc ch? ph?m b? sung m qu v? dng. C?ng nn bo cho h? bi?t r?ng qu v? c ht thu?c, u?ng r??u, ho?c c s? d?ng ma ty tri php hay khng. Vi th? c th? t??ng tc v?i thu?c c?a qu v?. Ti c?n ph?i theo di ?i?u g trong khi dng thu?c ny? Hy ?i g?p bc s? ho?c chuyn vin y t? ?? theo di ??nh k? s? c?i thi?n c?a qu v?. Bc s? c th? yu c?u th? mu v lm cc xt nghi?m khc ?? theo di s? c?i thi?n c?a qu v?. Hy lin l?c v?i bc s? ho?c chuyn vin y t?, n?u qu v? b? c?m l?nh ho?c b? b?nh nhi?m trng khc trong khi dng thu?c ny. Khng ???c t? ?i?u tr? cho mnh. Thu?c ny c th? lm gi?m kh? n?ng ch?ng l?i cc b?nh nhi?m trng c?a c? th?. Qu v? c?n ph?i ch?c ch?n r?ng c? th? qu v? nh?n ???c ??y ?? l??ng calci v vitamin D trong th?i gian dng thu?c ny, tr? khi bc s? yu c?u qu v? ??ng lm nh? v?y. Hy th?o lu?n v?i bc s? ho?c chuyn vin y t? v? nh?ng th?c ph?m qu v? ?n v nh?ng vitamin qu v? dng. Hy ?i nha s? th??ng xuyn. ?nh r?ng ho?c x?a r?ng b?ng ch? nha khoa nh? ? ???c ch? d?n. N?u qu v? c ?i lm r?ng, th hy bo v?i nha s? r?ng qu v? ?ang dng thu?c ny. Khng ???c c thai trong th?i gian dng thu?c ny ho?c trong vng 5 thng sau khi ng?ng dng thu?c. Ph? n? c?n ph?i thng bo cho bc s? c?a mnh, n?u mu?n c thai ho?c ngh? r?ng c th? mnh ? c [place]. C nguy c? v? cc tc d?ng ph? nghim tr?ng ??i v?i [place] nhi. Hy th?o lu?n v?i bc s? ho?c chuyn vin y t? ho?c d??c s? ?? bi?t thm thng tin. Ti c th? nh?n th?y nh?ng tc d?ng ph? no khi dng thu?c ny? Nh?ng tc d?ng ph? qu v? c?n ph?i bo cho bc s? ho?c chuyn vin y t? cng s?m cng t?t: -cc ph?n ?ng  d? ?ng, ch?ng h?n nh? da b? m?n ??, ng?a, n?i my ?ay, s?ng ? m?t, mi, ho?c l??i -cc v?n ?? v? h h?p -?au ng?c -tim ??p nhanh ho?c khng ??u -c?m th?y chong vng, ng?t x?u, b?  t -s?t, ?n l?nh, ho?c b?t k? d?u hi?u nhi?m trng no khc -co th?t, c?ng, ho?c gi?t c? b?p -t ho?c c?m gic nh? b? ki?n b -da b? r?p ho?c c u c?c, ho?c da b? kh, bong trc ho?c ?? -v?t th??ng lu lnh ho?c ?au khng r nguyn nhn ? mi?ng ho?c hm -b? b?m tm ho?c xu?t huy?t b?t th??ng Cc tc d?ng ph? khng c?n ph?i ch?m [place] y t? (Hy bo cho bc s? ho?c chuyn vin y t? n?u cc tc d?ng ph? ny ti?p di?n ho?c gy phi?n toi): -?au c? b?p -kh ch?u bao t?, ??y h?i [facility] ny c th? khng m t? ?? h?t cc tc d?ng ph? c th? x?y ra. Xin g?i t?i bc s? c?a mnh ?? ???c c? v?n chuyn mn v? cc tc d?ng ph?Sander Nephew v? c th? t??ng trnh cc tc d?ng ph? cho FDA theo s? [phone]. Ti nn c?t gi? thu?c c?a mnh ? ?u? Thu?c ny ch? ???c dng trong phng m?ch, v?n phng bc s? ho?c c? s? y t? khc v s? khng ???c c?t gi? t?i nh. L?U : ?y l b?n tm t?t. N c th? khng bao hm t?t c? thng tin c th? c. N?u qu v? th?c m?c v? thu?c ny, xin trao ??i v?i bc s?, d??c s?, ho?c ng??i cung c?p d?ch v? y t? c?a mnh.    2016, Elsevier/Gold Standard. (2012-07-05 00:00:00)

## 2016-03-04 NOTE — Telephone Encounter (Signed)
appt made and avs printed °

## 2016-03-04 NOTE — Telephone Encounter (Signed)
Per Dr. Gudena 

## 2016-03-04 NOTE — Progress Notes (Signed)
Patient Care Team: [name], MD as PCP - General (Internal Medicine)  DIAGNOSIS: metastatic breast cancer  SUMMARY OF ONCOLOGIC HISTORY:   Breast cancer of lower-outer quadrant of right female breast ([place])   01/04/1995 Surgery Right breast cancer status post lumpectomy followed by chemotherapy followed by radiation and tamoxifen 5 years (details are not available)   12/30/2015 Relapse/Recurrence Chest wall/ soft tissue masses near the right side of the sternum, superior nodule measuring 14.5 mm, inferior nodule measuring 10.5 mm, 26 mm anterior mediastinal soft tissue mass, T9 bone metastases   12/30/2015 Procedure Thoracentesis: 400 mL removed; showed reactive mesothelial cells, no cancer cells   12/30/2015 Imaging CT chest: 26 x 17 mm anterior mediastinal soft tissue mass, mixed lytic and sclerotic bone mets mainly involving sternum and T9 vertebral body, scattered vertebral body involvement and rib involvement; chest wall soft tissue masses 14.5 mm, 10.5 mm   01/13/2016 Initial Biopsy Right chest wall mass biopsy 2:30 position: Invasive ductal carcinoma focally involves skeletal muscle, grade 2, HER-2 negative ratio 1.08, ER/PR 95% positive   01/14/2016 PET scan Widespread metastatic disease right breast, right chest wall, mediastinum, right internal mammary lymph nodes, right pleural space, malignant pleural effusion, right common iliac lymph node, bone metastases in the thorax   01/21/2016 -  Anti-estrogen oral therapy Ibrance with letrozole    CHIEF COMPLIANT: follow-up of blood counts  INTERVAL HISTORY: Jessica Zavala is a 70 year old with above-mentioned history of metastatic breast cancer currently on Ibrance with letrozole. After she had been on one month of treatment, her blood counts have been low and we have been holding her treatment. She is here to recheck her blood counts and to discuss reinitiation of therapy. She reports good appetite. She is drinking lots of them short. I discussed  with her about drinking something without as much sugar.  REVIEW OF SYSTEMS:   Constitutional: Denies fevers, chills or abnormal weight loss Eyes: Denies blurriness of vision Ears, nose, mouth, throat, and face: Denies mucositis or sore throat Respiratory: Denies cough, dyspnea or wheezes Cardiovascular: Denies palpitation, chest discomfort Gastrointestinal:  Denies nausea, heartburn or change in bowel habits Skin: Denies abnormal skin rashes Lymphatics: Denies new lymphadenopathy or easy bruising Neurological:Denies numbness, tingling or new weaknesses Behavioral/Psych: Mood is stable, no new changes  Extremities: No lower extremity edema Breast:  denies any pain or lumps or nodules in either breasts All other systems were reviewed with the patient and are negative.  I have reviewed the past medical history, past surgical history, social history and family history with the patient and they are unchanged from previous note.  ALLERGIES:  is allergic to aspirin.  MEDICATIONS:  Current Outpatient Prescriptions  Medication Sig Dispense Refill  . acetaminophen (TYLENOL) 325 MG tablet Take 650 mg by mouth every 6 (six) hours as needed for mild pain.    . cromolyn (OPTICROM) 4 % ophthalmic solution Place 1 drop into the right eye 4 (four) times daily. Reported on 02/02/2016    . fluorometholone (FML) 0.1 % ophthalmic suspension Reported on 02/02/2016    . labetalol (NORMODYNE) 200 MG tablet Take 200 mg by mouth 2 (two) times daily.    Marland Kitchen letrozole (FEMARA) 2.5 MG tablet Take 1 tablet (2.5 mg total) by mouth daily. 90 tablet 3  . losartan (COZAAR) 100 MG tablet     . Menthol, Topical Analgesic, (BIOFREEZE COLORLESS) 4 % GEL Use in affected area TID prn (Patient not taking: Reported on 02/02/2016) 100 mL 0  .  olmesartan (BENICAR) 40 MG tablet Take 40 mg by mouth daily.    . Omega-3 Fatty Acids (FISH OIL PO) Take 1 capsule by mouth daily.    . palbociclib (IBRANCE) 125 MG capsule Take 1 capsule  (125 mg total) by mouth daily with breakfast. Take whole with food for 21 days, then take 7 days off. 21 capsule 0  . phenazopyridine (PYRIDIUM) 200 MG tablet Take 1 tablet (200 mg total) by mouth 3 (three) times daily. (Patient not taking: Reported on 02/02/2016) 6 tablet 0  . simvastatin (ZOCOR) 40 MG tablet Take 40 mg by mouth daily.     No current facility-administered medications for this visit.    PHYSICAL EXAMINATION: ECOG PERFORMANCE STATUS: 0 - Asymptomatic  Filed Vitals:   03/04/16 0935  BP: 147/68  Pulse: 67  Temp: 98.1 F (36.7 C)  Resp: 16   Filed Weights   03/04/16 0935  Weight: 117 lb 8 oz (53.298 kg)    GENERAL:alert, no distress and comfortable SKIN: skin color, texture, turgor are normal, no rashes or significant lesions EYES: normal, Conjunctiva are pink and non-injected, sclera clear OROPHARYNX:no exudate, no erythema and lips, buccal mucosa, and tongue normal  NECK: supple, thyroid normal size, non-tender, without nodularity LYMPH:  no palpable lymphadenopathy in the cervical, axillary or inguinal LUNGS: clear to auscultation and percussion with normal breathing effort HEART: regular rate & rhythm and no murmurs and no lower extremity edema ABDOMEN:abdomen soft, non-tender and normal bowel sounds MUSCULOSKELETAL:no cyanosis of digits and no clubbing  NEURO: alert & oriented x 3 with fluent speech, no focal motor/sensory deficits EXTREMITIES: No lower extremity edema  LABORATORY DATA:  I have reviewed the data as listed   Chemistry      Component Value Date/Time   NA 138 02/18/2016 0807   NA 136 12/20/2015 0509   K 4.8 02/18/2016 0807   K 3.8 12/20/2015 0509   CL 102 12/20/2015 0509   CO2 30* 02/18/2016 0807   CO2 27 12/20/2015 0509   BUN 10.5 02/18/2016 0807   BUN 19 12/20/2015 0509   CREATININE 0.8 02/18/2016 0807   CREATININE 0.71 12/20/2015 0509   CREATININE 0.66 10/08/2015 0954      Component Value Date/Time   CALCIUM 9.3 02/18/2016 0807     CALCIUM 9.3 12/20/2015 0509   ALKPHOS 59 02/18/2016 0807   ALKPHOS 63 10/08/2015 0954   AST 20 02/18/2016 0807   AST 20 10/08/2015 0954   ALT 13 02/18/2016 0807   ALT 10 10/08/2015 0954   BILITOT 0.45 02/18/2016 0807   BILITOT 0.5 10/08/2015 0954       Lab Results  Component Value Date   WBC 4.7 03/04/2016   HGB 12.0 03/04/2016   HCT 35.6 03/04/2016   MCV 93.0 03/04/2016   PLT 321 03/04/2016   NEUTROABS 2.1 03/04/2016     ASSESSMENT & PLAN:  Breast cancer of lower-outer quadrant of right female breast ([place]) Metastatic breast cancer PET-CT 01/14/16: Widespread metastatic disease right breast, right chest wall several nodules largest 3.7 cm, mediastinum, right internal mammary lymph node 2.2 cm, right pleural-based nodules 2.1cm and 2.7 cm, malignant pleural effusion, right common iliac lymph node, bone metastases in the thorax (sternum, manubrium, multiple thoracic vertebral bodies, multiple right-sided ribs)  Right chest wall mass biopsy 2:30 position 01/13/2016: Invasive ductal carcinoma focally involves skeletal muscle, grade 2, HER-2 negative ratio 1.08, ER/PR pending (Right breast cancer 1996 status post lumpectomy followed by chemotherapy followed by radiation and tamoxifen 5 years (  details are not available)) ---------------------------------------------------------------------------------------------------------------------------------------------------------- Radiology and pathology review: I discussed with the patient the details of the pathology report as well as the PET/CT scan negative reviewed the films with the patient.  Treatment plan: Ibrance with letrozole started 01/20/2016 , cycle 2 Ibrance 100 mg starting today. Ibrance toxicities: 1. Neutropenia: Today's ANC is 2100. Resume current therapy with Ibrance at reduced dosage of 100 mg daily Denies any hot flashes or myalgias related to letrozole.  Bone metastases: Xgeva q 4 weeks. Patient currently takes  calcium with vitamin D Hypertension: Instructed the patient to follow with her primary care physician  RTC in 4 weeks for lab check    No orders of the defined types were placed in this encounter.   The patient has a good understanding of the overall plan. she agrees with it. she will call with any problems that may develop before the next visit here.   Rulon Eisenmenger, MD 03/04/2016

## 2016-03-10 ENCOUNTER — Encounter: Payer: Self-pay | Admitting: Hematology and Oncology

## 2016-03-10 NOTE — Progress Notes (Signed)
Patient [place] sent me her approval letter & POE.  She is approved for $5000 to cover drugs associated w/ her Dx for 12 months from 01/26/16 w/ a 6 month look back period. [id number] is guaranteed, $3350 is accessible on a first-come first-served basis as long as funding is available. Pt has an available balance of $2053.47.  I emailed copy of approval letter & POE to [facility] in billing.

## 2016-03-17 ENCOUNTER — Other Ambulatory Visit: Payer: Commercial Managed Care - HMO

## 2016-03-17 ENCOUNTER — Ambulatory Visit: Payer: Commercial Managed Care - HMO

## 2016-04-01 ENCOUNTER — Ambulatory Visit (HOSPITAL_BASED_OUTPATIENT_CLINIC_OR_DEPARTMENT_OTHER): Payer: Commercial Managed Care - HMO

## 2016-04-01 ENCOUNTER — Ambulatory Visit (HOSPITAL_BASED_OUTPATIENT_CLINIC_OR_DEPARTMENT_OTHER): Payer: Commercial Managed Care - HMO | Admitting: Hematology and Oncology

## 2016-04-01 ENCOUNTER — Encounter: Payer: Self-pay | Admitting: Hematology and Oncology

## 2016-04-01 ENCOUNTER — Other Ambulatory Visit (HOSPITAL_BASED_OUTPATIENT_CLINIC_OR_DEPARTMENT_OTHER): Payer: Commercial Managed Care - HMO

## 2016-04-01 ENCOUNTER — Ambulatory Visit: Payer: Commercial Managed Care - HMO

## 2016-04-01 ENCOUNTER — Telehealth: Payer: Self-pay | Admitting: Hematology and Oncology

## 2016-04-01 VITALS — BP 146/71 | Temp 98.0°F

## 2016-04-01 DIAGNOSIS — C7951 Secondary malignant neoplasm of bone: Secondary | ICD-10-CM

## 2016-04-01 DIAGNOSIS — C50511 Malignant neoplasm of lower-outer quadrant of right female breast: Secondary | ICD-10-CM

## 2016-04-01 DIAGNOSIS — D702 Other drug-induced agranulocytosis: Secondary | ICD-10-CM

## 2016-04-01 DIAGNOSIS — C7989 Secondary malignant neoplasm of other specified sites: Secondary | ICD-10-CM

## 2016-04-01 DIAGNOSIS — J910 Malignant pleural effusion: Secondary | ICD-10-CM

## 2016-04-01 DIAGNOSIS — Z79811 Long term (current) use of aromatase inhibitors: Secondary | ICD-10-CM

## 2016-04-01 DIAGNOSIS — Z853 Personal history of malignant neoplasm of breast: Secondary | ICD-10-CM

## 2016-04-01 LAB — CBC WITH DIFFERENTIAL/PLATELET
BASO%: 0.4 % (ref 0.0–2.0)
Basophils Absolute: 0 10*3/uL (ref 0.0–0.1)
EOS%: 4.2 % (ref 0.0–7.0)
Eosinophils Absolute: 0.1 10*3/uL (ref 0.0–0.5)
HEMATOCRIT: 33.7 % — AB (ref 34.8–46.6)
HEMOGLOBIN: 11.3 g/dL — AB (ref 11.6–15.9)
LYMPH#: 1.4 10*3/uL (ref 0.9–3.3)
LYMPH%: 57.8 % — ABNORMAL HIGH (ref 14.0–49.7)
MCH: 32.3 pg (ref 25.1–34.0)
MCHC: 33.5 g/dL (ref 31.5–36.0)
MCV: 96.3 fL (ref 79.5–101.0)
MONO#: 0.2 10*3/uL (ref 0.1–0.9)
MONO%: 7.7 % (ref 0.0–14.0)
NEUT%: 29.9 % — AB (ref 38.4–76.8)
NEUTROS ABS: 0.8 10*3/uL — AB (ref 1.5–6.5)
PLATELETS: 198 10*3/uL (ref 145–400)
RBC: 3.51 10*6/uL — ABNORMAL LOW (ref 3.70–5.45)
RDW: 16.8 % — AB (ref 11.2–14.5)
WBC: 2.5 10*3/uL — AB (ref 3.9–10.3)

## 2016-04-01 LAB — COMPREHENSIVE METABOLIC PANEL
ALBUMIN: 3.8 g/dL (ref 3.5–5.0)
ALT: 12 U/L (ref 0–55)
ANION GAP: 6 meq/L (ref 3–11)
AST: 19 U/L (ref 5–34)
Alkaline Phosphatase: 48 U/L (ref 40–150)
BUN: 14 mg/dL (ref 7.0–26.0)
CALCIUM: 9.2 mg/dL (ref 8.4–10.4)
CO2: 30 mEq/L — ABNORMAL HIGH (ref 22–29)
CREATININE: 0.8 mg/dL (ref 0.6–1.1)
Chloride: 102 mEq/L (ref 98–109)
EGFR: 71 mL/min/{1.73_m2} — ABNORMAL LOW (ref >90)
Glucose: 92 mg/dl (ref 70–140)
Potassium: 4.8 mEq/L (ref 3.5–5.1)
Sodium: 139 mEq/L (ref 136–145)
TOTAL PROTEIN: 7.6 g/dL (ref 6.4–8.3)
Total Bilirubin: 0.51 mg/dL (ref 0.20–1.20)

## 2016-04-01 MED ORDER — DENOSUMAB 120 MG/1.7ML ~~LOC~~ SOLN
120.0000 mg | Freq: Once | SUBCUTANEOUS | Status: AC
  Administered 2016-04-01: 120 mg via SUBCUTANEOUS
  Filled 2016-04-01: qty 1.7

## 2016-04-01 MED ORDER — MAGIC MOUTHWASH W/LIDOCAINE: 5.0000 mL | Freq: Three times a day (TID) | ORAL | Status: DC | PRN

## 2016-04-01 NOTE — Patient Instructions (Signed)
Denosumab injection ?y l thu?c g? DENOSUMAB lm ch?m s? suy y?u x??ng. Prolia ???c dng ?? ?i?u tr? ch?ng long x??ng ? ph? n? sau khi mn kinh v ? nam gi?i. Xgeva ???c dng ?? phng ng?a gy x??ng v cc v?n ?? khc v? x??ng gy ra b?i di c?n x??ng do ung th?. Xgeva c?ng ???c dng ?? ?i?u tr? b??u t? bo kh?ng l? (giant cell tumor) c?a x??ng. Thu?c ny c th? ???c dng cho nh?ng m?c ?ch khc; hy h?i ng??i cung c?p d?ch v? y t? ho?c d??c s? c?a mnh, n?u qu v? c th?c m?c. Ti c?n ph?i bo cho ng??i cung c?p d?ch v? y t? c?a mnh ?i?u g tr??c khi dng thu?c ny? H? c?n bi?t li?u qu v? c b?t k? tnh tr?ng no sau ?y khng: -b?nh v? r?ng -chm da -nhi?m trng ho?c ti?n s? nhi?m trng -b?nh th?n ho?c n?u qu v? ?ang ???c th?m phn -m?c calci ho?c vitamin D trong mu th?p -h?i ch?ng r?i lo?n h?p th? -???c x?p vo l?ch ?? gi?i ph?u ho?c nh? r?ng -?ang du?ng thu?c co? ch??a denosumab -b?nh tuy?n gip tr?ng ho?c tuy?n c?n gip tr?ng -ph?n ?ng b?t th??ng v?i denosumab -pha?n ??ng b?t th???ng ho??c di? ??ng v??i ca?c d??c ph?m kha?c -pha?n ??ng b?t th???ng ho??c di? ??ng v??i th??c ph?m, thu?c nhu?m, ho??c ch?t ba?o qua?n -?ang c thai ho??c ??nh co? thai -?ang cho con bu? Ti nn s? d?ng thu?c ny nh? th? no? Thu?c ny ?? tim d??i da. Thu?c ny ???c s? d?ng b?i chuyn vin y t? ? b?nh vi?n ho?c ? phng m?ch. N?u qu v? s?p dng thu?c Prolia, d??c s? s? ??a cho qu v? m?t B?n H??ng D?n v? D??c Ph?m (MedGuide) ??c bi?t cho m?i toa thu?c v cho m?i l?n mua thm thu?c ?. Hy b?o ??m ??c k? thng tin ny m?i l?n. ??i v?i Prolia, hy bn v?i bc s? nhi khoa c?a qu v? v? vi?c dng thu?c ny ? tr? em. C th? c?n ch?m sc ??c bi?t. ??i v?i Xgeva, hy bn v?i bc s? nhi khoa c?a qu v? v? vi?c dng thu?c ny ? tr? em. Thu?c ny c th? ???c k toa cho tr? em ch? m?i 13 tu?i trong nh?ng tr??ng h?p ch?n l?c, nh?ng c?n ph?i th?n tr?ng. Qu li?u: N?u qu v? cho r?ng mnh ? dng qu nhi?u  thu?c ny, th hy lin l?c v?i trung tm ki?m sot ch?t ??c ho?c phng c?p c?u ngay l?p t?c. L?U : Thu?c ny ch? dnh ring cho qu v?. Khng chia s? thu?c ny v?i nh?ng ng??i khc. N?u ti l? qun m?t li?u th sao? ?i?u quan tr?ng l khng nn b? l? li?u thu?c no. Hy lin l?c v?i bc s? ho?c chuyn vin y t? c?a mnh, n?u qu v? khng th? gi? ?ng cu?c h?n khm. Nh?ng g c th? t??ng tc v?i thu?c ny? Khng ???c dng thu?c ny cng v?i b?t k? th? no sau ?y: -nh??ng thu?c kha?c co? ch??a denosumab Thu?c ny c?ng c th? t??ng tc v?i cc thu?c sau ?y: -cc thu?c ?c ch? h? mi?n d?ch -cc thu?c ?i?u tr? b?nh ung th? -cc thu?c steroid, ch?ng h?n nh? prednisone ho?c cortisone Danh sch ny c th? khng m t? ?? h?t cc t??ng tc c th? x?y ra. Hy ??a cho ng??i cung c?p d?ch v? y t? c?a mnh danh sch t?t c? cc thu?c, th?o d??c, cc thu?c khng c?n toa, ho?c cc   ch? ph?m b? sung m qu v? dng. C?ng nn bo cho h? bi?t r?ng qu v? c ht thu?c, u?ng r??u, ho?c c s? d?ng ma ty tri php hay khng. Vi th? c th? t??ng tc v?i thu?c c?a qu v?. Ti c?n ph?i theo di ?i?u g trong khi dng thu?c ny? Hy ?i g?p bc s? ho?c chuyn vin y t? ?? theo di ??nh k? s? c?i thi?n c?a qu v?. Bc s? c th? yu c?u th? mu v lm cc xt nghi?m khc ?? theo di s? c?i thi?n c?a qu v?. Hy lin l?c v?i bc s? ho?c chuyn vin y t?, n?u qu v? b? c?m l?nh ho?c b? b?nh nhi?m trng khc trong khi dng thu?c ny. Khng ???c t? ?i?u tr? cho mnh. Thu?c ny c th? lm gi?m kh? n?ng ch?ng l?i cc b?nh nhi?m trng c?a c? th?. Qu v? c?n ph?i ch?c ch?n r?ng c? th? qu v? nh?n ???c ??y ?? l??ng calci v vitamin D trong th?i gian dng thu?c ny, tr? khi bc s? yu c?u qu v? ??ng lm nh? v?y. Hy th?o lu?n v?i bc s? ho?c chuyn vin y t? v? nh?ng th?c ph?m qu v? ?n v nh?ng vitamin qu v? dng. Hy ?i nha s? th??ng xuyn. ?nh r?ng ho?c x?a r?ng b?ng ch? nha khoa nh? ? ???c ch? d?n. N?u qu v? c ?i lm r?ng, th  hy bo v?i nha s? r?ng qu v? ?ang dng thu?c ny. Khng ???c c thai trong th?i gian dng thu?c ny ho?c trong vng 5 thng sau khi ng?ng dng thu?c. Ph? n? c?n ph?i thng bo cho bc s? c?a mnh, n?u mu?n c thai ho?c ngh? r?ng c th? mnh ? c thai. C nguy c? v? cc tc d?ng ph? nghim tr?ng ??i v?i thai nhi. Hy th?o lu?n v?i bc s? ho?c chuyn vin y t? ho?c d??c s? ?? bi?t thm thng tin. Ti c th? nh?n th?y nh?ng tc d?ng ph? no khi dng thu?c ny? Nh?ng tc d?ng ph? qu v? c?n ph?i bo cho bc s? ho?c chuyn vin y t? cng s?m cng t?t: -cc ph?n ?ng d? ?ng, ch?ng h?n nh? da b? m?n ??, ng?a, n?i my ?ay, s?ng ? m?t, mi, ho?c l??i -cc v?n ?? v? h h?p -?au ng?c -tim ??p nhanh ho?c khng ??u -c?m th?y chong vng, ng?t x?u, b? t -s?t, ?n l?nh, ho?c b?t k? d?u hi?u nhi?m trng no khc -co th?t, c?ng, ho?c gi?t c? b?p -t ho?c c?m gic nh? b? ki?n b -da b? r?p ho?c c u c?c, ho?c da b? kh, bong trc ho?c ?? -v?t th??ng lu lnh ho?c ?au khng r nguyn nhn ? mi?ng ho?c hm -b? b?m tm ho?c xu?t huy?t b?t th??ng Cc tc d?ng ph? khng c?n ph?i ch?m sc y t? (Hy bo cho bc s? ho?c chuyn vin y t? n?u cc tc d?ng ph? ny ti?p di?n ho?c gy phi?n toi): -?au c? b?p -kh ch?u bao t?, ??y h?i Danh sch ny c th? khng m t? ?? h?t cc tc d?ng ph? c th? x?y ra. Xin g?i t?i bc s? c?a mnh ?? ???c c? v?n chuyn mn v? cc tc d?ng ph?. Qu v? c th? t??ng trnh cc tc d?ng ph? cho FDA theo s? [phone]. Ti nn c?t gi? thu?c c?a mnh ? ?u? Thu?c ny ch? ???c dng trong phng m?ch, v?n phng bc s? ho?c c? s? y t? khc v s? khng ???c c?t gi?   t?i nh. L?U : ?y l b?n tm t?t. N c th? khng bao hm t?t c? thng tin c th? c. N?u qu v? th?c m?c v? thu?c ny, xin trao ??i v?i bc s?, d??c s?, ho?c ng??i cung c?p d?ch v? y t? c?a mnh.    2016, Elsevier/Gold Standard. (2012-07-05 00:00:00)  

## 2016-04-01 NOTE — Telephone Encounter (Signed)
appt made and avs printed. Ct to be sch by [facility] radi[facility]

## 2016-04-01 NOTE — Progress Notes (Signed)
Patient Care Team: [name], MD as PCP - General (Internal Medicine)  DIAGNOSIS: Stage IV breast cancer  SUMMARY OF ONCOLOGIC HISTORY:   Breast cancer of lower-outer quadrant of right female breast ([name])   01/04/1995 Surgery Right breast cancer status post lumpectomy followed by chemotherapy followed by radiation and tamoxifen 5 years (details are not available)   12/30/2015 Relapse/Recurrence Chest wall/ soft tissue masses near the right side of the sternum, superior nodule measuring 14.5 mm, inferior nodule measuring 10.5 mm, 26 mm anterior mediastinal soft tissue mass, T9 bone metastases   12/30/2015 Procedure Thoracentesis: 400 mL removed; showed reactive mesothelial cells, no cancer cells   12/30/2015 Imaging CT chest: 26 x 17 mm anterior mediastinal soft tissue mass, mixed lytic and sclerotic bone mets mainly involving sternum and T9 vertebral body, scattered vertebral body involvement and rib involvement; chest wall soft tissue masses 14.5 mm, 10.5 mm   01/13/2016 Initial Biopsy Right chest wall mass biopsy 2:30 position: Invasive ductal carcinoma focally involves skeletal muscle, grade 2, HER-2 negative ratio 1.08, ER/PR 95% positive   01/14/2016 PET scan Widespread metastatic disease right breast, right chest wall, mediastinum, right internal mammary lymph nodes, right pleural space, malignant pleural effusion, right common iliac lymph node, bone metastases in the thorax   01/21/2016 -  Anti-estrogen oral therapy Ibrance with letrozole    CHIEF COMPLIANT: Patient here to start cycle 3 of Ibrance  INTERVAL HISTORY: Jessica Zavala is a 70 year old with above-mentioned history of metastatic breast cancer with malignant pleural effusion along with bone metastases as well as mediastinal lymph nodes Jessica Zavala is currently on Ibrance with letrozole. She complains of difficulty with swallowing with some pain in the back of the throat. Her breathing appears to be reasonably good although she continues to have  occasional cough. She denies any fevers or chills.  REVIEW OF SYSTEMS:   Constitutional: Denies fevers, chills or abnormal weight loss Eyes: Denies blurriness of vision Ears, nose, mouth, throat, and face: Denies mucositis or sore throat Respiratory: Complains of cough Cardiovascular: Denies palpitation, chest discomfort Gastrointestinal:  Denies nausea, heartburn or change in bowel habits Skin: Denies abnormal skin rashes Lymphatics: Denies new lymphadenopathy or easy bruising Neurological:Denies numbness, tingling or new weaknesses Behavioral/Psych: Mood is stable, no new changes  Extremities: No lower extremity edema  All other systems were reviewed with the patient and are negative.  I have reviewed the past medical history, past surgical history, social history and family history with the patient and they are unchanged from previous note.  ALLERGIES:  is allergic to aspirin.  MEDICATIONS:  Current Outpatient Prescriptions  Medication Sig Dispense Refill  . acetaminophen (TYLENOL) 325 MG tablet Take 650 mg by mouth every 6 (six) hours as needed for mild pain.    . cromolyn (OPTICROM) 4 % ophthalmic solution Place 1 drop into the right eye 4 (four) times daily. Reported on 02/02/2016    . fluorometholone (FML) 0.1 % ophthalmic suspension Reported on 02/02/2016    . labetalol (NORMODYNE) 200 MG tablet Take 200 mg by mouth 2 (two) times daily.    Marland Kitchen letrozole (FEMARA) 2.5 MG tablet Take 1 tablet (2.5 mg total) by mouth daily. 90 tablet 3  . losartan (COZAAR) 100 MG tablet     . magic mouthwash w/lidocaine SOLN Take 5 mLs by mouth 3 (three) times daily as needed for mouth pain. 50 mL 0  . Menthol, Topical Analgesic, (BIOFREEZE COLORLESS) 4 % GEL Use in affected area TID prn (Patient not taking:  Reported on 02/02/2016) 100 mL 0  . olmesartan (BENICAR) 40 MG tablet Take 40 mg by mouth daily.    . Omega-3 Fatty Acids (FISH OIL PO) Take 1 capsule by mouth daily.    . palbociclib (IBRANCE)  100 MG capsule Take 1 capsule (100 mg total) by mouth daily with breakfast. 21 capsule 7  . phenazopyridine (PYRIDIUM) 200 MG tablet Take 1 tablet (200 mg total) by mouth 3 (three) times daily. (Patient not taking: Reported on 02/02/2016) 6 tablet 0  . simvastatin (ZOCOR) 40 MG tablet Take 40 mg by mouth daily.     No current facility-administered medications for this visit.    PHYSICAL EXAMINATION: ECOG PERFORMANCE STATUS: 1 - Symptomatic but completely ambulatory  Filed Vitals:   04/01/16 1033  BP: 146/71  Temp: 98 F (36.7 C)   There were no vitals filed for this visit.  GENERAL:alert, no distress and comfortable SKIN: skin color, texture, turgor are normal, no rashes or significant lesions EYES: normal, Conjunctiva are pink and non-injected, sclera clear OROPHARYNX:no exudate, no erythema and lips, buccal mucosa, and tongue normal  NECK: supple, thyroid normal size, non-tender, without nodularity LYMPH:  no palpable lymphadenopathy in the cervical, axillary or inguinal LUNGS: Diminished breath sounds at the right lung base HEART: regular rate & rhythm and no murmurs and no lower extremity edema ABDOMEN:abdomen soft, non-tender and normal bowel sounds MUSCULOSKELETAL:no cyanosis of digits and no clubbing  NEURO: alert & oriented x 3 with fluent speech, no focal motor/sensory deficits EXTREMITIES: No lower extremity edema  LABORATORY DATA:  I have reviewed the data as listed   Chemistry      Component Value Date/Time   NA 139 04/01/2016 0918   NA 136 12/20/2015 0509   K 4.8 04/01/2016 0918   K 3.8 12/20/2015 0509   CL 102 12/20/2015 0509   CO2 30* 04/01/2016 0918   CO2 27 12/20/2015 0509   BUN 14.0 04/01/2016 0918   BUN 19 12/20/2015 0509   CREATININE 0.8 04/01/2016 0918   CREATININE 0.71 12/20/2015 0509   CREATININE 0.66 10/08/2015 0954      Component Value Date/Time   CALCIUM 9.2 04/01/2016 0918   CALCIUM 9.3 12/20/2015 0509   ALKPHOS 48 04/01/2016 0918    ALKPHOS 63 10/08/2015 0954   AST 19 04/01/2016 0918   AST 20 10/08/2015 0954   ALT 12 04/01/2016 0918   ALT 10 10/08/2015 0954   BILITOT 0.51 04/01/2016 0918   BILITOT 0.5 10/08/2015 0954      Lab Results  Component Value Date   WBC 2.5* 04/01/2016   HGB 11.3* 04/01/2016   HCT 33.7* 04/01/2016   MCV 96.3 04/01/2016   PLT 198 04/01/2016   NEUTROABS 0.8* 04/01/2016   ASSESSMENT & PLAN:  Breast cancer of lower-outer quadrant of right female breast ([place]) Metastatic breast cancer PET-CT 01/14/16: Widespread metastatic disease right breast, right chest wall several nodules largest 3.7 cm, mediastinum, right internal mammary lymph node 2.2 cm, right pleural-based nodules 2.1cm and 2.7 cm, malignant pleural effusion, right common iliac lymph node, bone metastases in the thorax (sternum, manubrium, multiple thoracic vertebral bodies, multiple right-sided ribs)  Right chest wall mass biopsy 2:30 position 01/13/2016: Invasive ductal carcinoma focally involves skeletal muscle, grade 2, HER-2 negative ratio 1.08, ER/PR pending (Right breast cancer 1996 status post lumpectomy followed by chemotherapy followed by radiation and tamoxifen 5 years (details are not available)) ---------------------------------------------------------------------------------------------------------------------------------------------------------- Radiology and pathology review: I discussed with the patient the details of the pathology report  as well as the PET/CT scan negative reviewed the films with the patient.  Treatment plan: Ibrance with letrozole started 01/20/2016 , cycle 2 Ibrance 100 mg starting today. Ibrance toxicities: 1. Neutropenia: Today's ANC is 800.  We will not resume Ibrance today. Delay treatment by one week and we will recheck blood work and start the patient at 75 mg dose. Denies any hot flashes or myalgias related to letrozole.  Bone metastases: Xgeva q 4 weeks. Patient currently takes calcium  with vitamin D Hypertension: Instructed the patient to follow with her primary care physician Our plan is to obtain CT chest abdomen pelvis there are May 22. RTC in 1 weeks for lab check    Orders Placed This Encounter  Procedures  . CT Abdomen Pelvis W Contrast    Standing Status: Future     Number of Occurrences:      Standing Expiration Date: 04/01/2017    Order Specific Question:  If indicated for the ordered procedure, I authorize the administration of contrast media per Radiology protocol    Answer:  Yes    Order Specific Question:  Reason for Exam (SYMPTOM  OR DIAGNOSIS REQUIRED)    Answer:  Met breast cancer restaging on treatment    Order Specific Question:  Preferred imaging location?    Answer:  [facility]  . CT Chest W Contrast    Standing Status: Future     Number of Occurrences:      Standing Expiration Date: 04/01/2017    Order Specific Question:  If indicated for the ordered procedure, I authorize the administration of contrast media per Radiology protocol    Answer:  Yes    Order Specific Question:  Reason for Exam (SYMPTOM  OR DIAGNOSIS REQUIRED)    Answer:  Met breast cancer restaging on treatment    Order Specific Question:  Preferred imaging location?    Answer:  [facility]  . CBC with Differential    Standing Status: Future     Number of Occurrences:      Standing Expiration Date: 04/01/2017  . Comprehensive metabolic panel    Standing Status: Future     Number of Occurrences:      Standing Expiration Date: 04/01/2017   The patient has a good understanding of the overall plan. she agrees with it. she will call with any problems that may develop before the next visit here.   Rulon Eisenmenger, MD 04/01/2016

## 2016-04-01 NOTE — Progress Notes (Signed)
Unable to get in to exam room prior to MD.  No assessment performed.  

## 2016-04-01 NOTE — Assessment & Plan Note (Signed)
Metastatic breast cancer PET-CT 01/14/16: Widespread metastatic disease right breast, right chest wall several nodules largest 3.7 cm, mediastinum, right internal mammary lymph node 2.2 cm, right pleural-based nodules 2.1cm and 2.7 cm, malignant pleural effusion, right common iliac lymph node, bone metastases in the thorax (sternum, manubrium, multiple thoracic vertebral bodies, multiple right-sided ribs)  Right chest wall mass biopsy 2:30 position 01/13/2016: Invasive ductal carcinoma focally involves skeletal muscle, grade 2, HER-2 negative ratio 1.08, ER/PR pending (Right breast cancer 1996 status post lumpectomy followed by chemotherapy followed by radiation and tamoxifen 5 years (details are not available)) ---------------------------------------------------------------------------------------------------------------------------------------------------------- Radiology and pathology review: I discussed with the patient the details of the pathology report as well as the PET/CT scan negative reviewed the films with the patient.  Treatment plan: Ibrance with letrozole started 01/20/2016 , cycle 2 Ibrance 100 mg starting today. Ibrance toxicities: 1. Neutropenia: Today's ANC is 800.  We will not resume Ibrance today. Delay treatment by one week and we will recheck blood work and start the patient at 75 mg dose. Denies any hot flashes or myalgias related to letrozole.  Bone metastases: Xgeva q 4 weeks. Patient currently takes calcium with vitamin D Hypertension: Instructed the patient to follow with her primary care physician Our plan is to obtain CT chest abdomen pelvis there are May 22. RTC in 1 weeks for lab check

## 2016-04-07 NOTE — Assessment & Plan Note (Signed)
Metastatic breast cancer PET-CT 01/14/16: Widespread metastatic disease right breast, right chest wall several nodules largest 3.7 cm, mediastinum, right internal mammary lymph node 2.2 cm, right pleural-based nodules 2.1cm and 2.7 cm, malignant pleural effusion, right common iliac lymph node, bone metastases in the thorax (sternum, manubrium, multiple thoracic vertebral bodies, multiple right-sided ribs)  Right chest wall mass biopsy 2:30 position 01/13/2016: Invasive ductal carcinoma focally involves skeletal muscle, grade 2, HER-2 negative ratio 1.08, ER/PR pending (Right breast cancer 1996 status post lumpectomy followed by chemotherapy followed by radiation and tamoxifen 5 years (details are not available)) ---------------------------------------------------------------------------------------------------------------------------------------------------------- Radiology and pathology review: I discussed with the patient the details of the pathology report as well as the PET/CT scan negative reviewed the films with the patient.  Treatment plan: Ibrance with letrozole started 01/20/2016 , cycle 3 Ibrance 100 mg starting today. Ibrance toxicities: 1. Neutropenia: Today's ANC is 800.  We will not resume Ibrance today. Delay treatment by one week and we will recheck blood work and start the patient at 75 mg dose. Denies any hot flashes or myalgias related to letrozole.he  Bone metastases: Xgeva q 4 weeks. Patient currently takes calcium with vitamin D Hypertension: Instructed the patient to follow with her primary care physician Our plan is to obtain CT chest abdomen pelvis there are May 22. RTC in 4 weeks for lab check

## 2016-04-08 ENCOUNTER — Encounter: Payer: Self-pay | Admitting: Hematology and Oncology

## 2016-04-08 ENCOUNTER — Other Ambulatory Visit (HOSPITAL_BASED_OUTPATIENT_CLINIC_OR_DEPARTMENT_OTHER): Payer: Commercial Managed Care - HMO

## 2016-04-08 ENCOUNTER — Telehealth: Payer: Self-pay | Admitting: Hematology and Oncology

## 2016-04-08 ENCOUNTER — Ambulatory Visit (HOSPITAL_BASED_OUTPATIENT_CLINIC_OR_DEPARTMENT_OTHER): Payer: Commercial Managed Care - HMO | Admitting: Hematology and Oncology

## 2016-04-08 VITALS — BP 148/71 | HR 69 | Temp 97.6°F | Resp 18 | Ht <= 58 in | Wt 119.2 lb

## 2016-04-08 DIAGNOSIS — J910 Malignant pleural effusion: Secondary | ICD-10-CM

## 2016-04-08 DIAGNOSIS — C7951 Secondary malignant neoplasm of bone: Secondary | ICD-10-CM

## 2016-04-08 DIAGNOSIS — C50511 Malignant neoplasm of lower-outer quadrant of right female breast: Secondary | ICD-10-CM | POA: Diagnosis not present

## 2016-04-08 DIAGNOSIS — C7989 Secondary malignant neoplasm of other specified sites: Secondary | ICD-10-CM | POA: Diagnosis not present

## 2016-04-08 DIAGNOSIS — Z79811 Long term (current) use of aromatase inhibitors: Secondary | ICD-10-CM

## 2016-04-08 LAB — COMPREHENSIVE METABOLIC PANEL
ALT: 12 U/L (ref 0–55)
ANION GAP: 6 meq/L (ref 3–11)
AST: 20 U/L (ref 5–34)
Albumin: 3.7 g/dL (ref 3.5–5.0)
Alkaline Phosphatase: 41 U/L (ref 40–150)
BUN: 12.5 mg/dL (ref 7.0–26.0)
CALCIUM: 9.2 mg/dL (ref 8.4–10.4)
CHLORIDE: 103 meq/L (ref 98–109)
CO2: 29 mEq/L (ref 22–29)
Creatinine: 0.8 mg/dL (ref 0.6–1.1)
EGFR: 73 mL/min/{1.73_m2} — ABNORMAL LOW (ref >90)
Glucose: 87 mg/dl (ref 70–140)
POTASSIUM: 4.4 meq/L (ref 3.5–5.1)
Sodium: 138 mEq/L (ref 136–145)
Total Bilirubin: 0.38 mg/dL (ref 0.20–1.20)
Total Protein: 7.4 g/dL (ref 6.4–8.3)

## 2016-04-08 LAB — CBC WITH DIFFERENTIAL/PLATELET
BASO%: 1.1 % (ref 0.0–2.0)
BASOS ABS: 0 10*3/uL (ref 0.0–0.1)
EOS%: 4.9 % (ref 0.0–7.0)
Eosinophils Absolute: 0.2 10*3/uL (ref 0.0–0.5)
HEMATOCRIT: 33.9 % — AB (ref 34.8–46.6)
HGB: 11.3 g/dL — ABNORMAL LOW (ref 11.6–15.9)
LYMPH#: 1.6 10*3/uL (ref 0.9–3.3)
LYMPH%: 49 % (ref 14.0–49.7)
MCH: 32.4 pg (ref 25.1–34.0)
MCHC: 33.2 g/dL (ref 31.5–36.0)
MCV: 97.5 fL (ref 79.5–101.0)
MONO#: 0.5 10*3/uL (ref 0.1–0.9)
MONO%: 15.1 % — ABNORMAL HIGH (ref 0.0–14.0)
NEUT#: 1 10*3/uL — ABNORMAL LOW (ref 1.5–6.5)
NEUT%: 29.9 % — AB (ref 38.4–76.8)
PLATELETS: 250 10*3/uL (ref 145–400)
RBC: 3.47 10*6/uL — AB (ref 3.70–5.45)
RDW: 19.1 % — ABNORMAL HIGH (ref 11.2–14.5)
WBC: 3.2 10*3/uL — AB (ref 3.9–10.3)

## 2016-04-08 MED ORDER — PALBOCICLIB 75 MG PO CAPS: 75.0000 mg | ORAL_CAPSULE | Freq: Every day | ORAL | Status: DC

## 2016-04-08 MED FILL — *IBRANCE 75 MG CAPSULE: 75 | 21 days supply | Qty: 21 | Fill #0

## 2016-04-08 NOTE — Progress Notes (Signed)
Patient Care Team: [name], MD as PCP - General (Internal Medicine)  DIAGNOSIS: Metastatic breast cancer  SUMMARY OF ONCOLOGIC HISTORY:   Breast cancer of lower-outer quadrant of right female breast ([place])   01/04/1995 Surgery Right breast cancer status post lumpectomy followed by chemotherapy followed by radiation and tamoxifen 5 years (details are not available)   12/30/2015 Relapse/Recurrence Chest wall/ soft tissue masses near the right side of the sternum, superior nodule measuring 14.5 mm, inferior nodule measuring 10.5 mm, 26 mm anterior mediastinal soft tissue mass, T9 bone metastases   12/30/2015 Procedure Thoracentesis: 400 mL removed; showed reactive mesothelial cells, no cancer cells   12/30/2015 Imaging CT chest: 26 x 17 mm anterior mediastinal soft tissue mass, mixed lytic and sclerotic bone mets mainly involving sternum and T9 vertebral body, scattered vertebral body involvement and rib involvement; chest wall soft tissue masses 14.5 mm, 10.5 mm   01/13/2016 Initial Biopsy Right chest wall mass biopsy 2:30 position: Invasive ductal carcinoma focally involves skeletal muscle, grade 2, HER-2 negative ratio 1.08, ER/PR 95% positive   01/14/2016 PET scan Widespread metastatic disease right breast, right chest wall, mediastinum, right internal mammary lymph nodes, right pleural space, malignant pleural effusion, right common iliac lymph node, bone metastases in the thorax   01/21/2016 -  Anti-estrogen oral therapy Ibrance with letrozole    CHIEF COMPLIANT: Follow-up on Ibrance for blood work  INTERVAL HISTORY: Jessica Zavala is a 70 year old with above-mentioned history of metastatic breast cancer currently on Ibrance with letrozole. We had to hold treatment last week because of ANC of 800. Today she is here to reinitiated treatment. Her in situ invasive 1000. She reports no major problems or concerns. She has chronic mild low back pain. Denies any nausea vomiting fatigue or diarrhea related  to Ibrance.  REVIEW OF SYSTEMS:   Constitutional: Denies fevers, chills or abnormal weight loss Eyes: Denies blurriness of vision Ears, nose, mouth, throat, and face: Denies mucositis or sore throat Respiratory: Denies cough, dyspnea or wheezes Cardiovascular: Denies palpitation, chest discomfort Gastrointestinal:  Denies nausea, heartburn or change in bowel habits Skin: Denies abnormal skin rashes Lymphatics: Denies new lymphadenopathy or easy bruising Neurological:Denies numbness, tingling or new weaknesses Behavioral/Psych: Mood is stable, no new changes  Extremities: No lower extremity edema Breast:  denies any pain or lumps or nodules in either breasts All other systems were reviewed with the patient and are negative.  I have reviewed the past medical history, past surgical history, social history and family history with the patient and they are unchanged from previous note.  ALLERGIES:  is allergic to aspirin.  MEDICATIONS:  Current Outpatient Prescriptions  Medication Sig Dispense Refill  . acetaminophen (TYLENOL) 325 MG tablet Take 650 mg by mouth every 6 (six) hours as needed for mild pain.    . cromolyn (OPTICROM) 4 % ophthalmic solution Place 1 drop into the right eye 4 (four) times daily. Reported on 02/02/2016    . fluorometholone (FML) 0.1 % ophthalmic suspension Reported on 02/02/2016    . labetalol (NORMODYNE) 200 MG tablet Take 200 mg by mouth 2 (two) times daily.    Marland Kitchen letrozole (FEMARA) 2.5 MG tablet Take 1 tablet (2.5 mg total) by mouth daily. 90 tablet 3  . losartan (COZAAR) 100 MG tablet     . magic mouthwash w/lidocaine SOLN Take 5 mLs by mouth 3 (three) times daily as needed for mouth pain. 50 mL 0  . Menthol, Topical Analgesic, (BIOFREEZE COLORLESS) 4 % GEL Use in  affected area TID prn (Patient not taking: Reported on 02/02/2016) 100 mL 0  . olmesartan (BENICAR) 40 MG tablet Take 40 mg by mouth daily.    . Omega-3 Fatty Acids (FISH OIL PO) Take 1 capsule by mouth  daily.    . palbociclib (IBRANCE) 75 MG capsule Take 1 capsule (75 mg total) by mouth daily with breakfast. 21 capsule 0  . phenazopyridine (PYRIDIUM) 200 MG tablet Take 1 tablet (200 mg total) by mouth 3 (three) times daily. (Patient not taking: Reported on 02/02/2016) 6 tablet 0  . simvastatin (ZOCOR) 40 MG tablet Take 40 mg by mouth daily.     No current facility-administered medications for this visit.    PHYSICAL EXAMINATION: ECOG PERFORMANCE STATUS: 1 - Symptomatic but completely ambulatory  Filed Vitals:   04/08/16 1001  BP: 148/71  Pulse: 69  Temp: 97.6 F (36.4 C)  Resp: 18   Filed Weights   04/08/16 1001  Weight: 119 lb 3.2 oz (54.069 kg)    GENERAL:alert, no distress and comfortable SKIN: skin color, texture, turgor are normal, no rashes or significant lesions EYES: normal, Conjunctiva are pink and non-injected, sclera clear OROPHARYNX:no exudate, no erythema and lips, buccal mucosa, and tongue normal  NECK: supple, thyroid normal size, non-tender, without nodularity LYMPH:  no palpable lymphadenopathy in the cervical, axillary or inguinal LUNGS: clear to auscultation and percussion with normal breathing effort HEART: regular rate & rhythm and no murmurs and no lower extremity edema ABDOMEN:abdomen soft, non-tender and normal bowel sounds MUSCULOSKELETAL:no cyanosis of digits and no clubbing  NEURO: alert & oriented x 3 with fluent speech, no focal motor/sensory deficits EXTREMITIES: No lower extremity edema  LABORATORY DATA:  I have reviewed the data as listed   Chemistry      Component Value Date/Time   NA 138 04/08/2016 0941   NA 136 12/20/2015 0509   K 4.4 04/08/2016 0941   K 3.8 12/20/2015 0509   CL 102 12/20/2015 0509   CO2 29 04/08/2016 0941   CO2 27 12/20/2015 0509   BUN 12.5 04/08/2016 0941   BUN 19 12/20/2015 0509   CREATININE 0.8 04/08/2016 0941   CREATININE 0.71 12/20/2015 0509   CREATININE 0.66 10/08/2015 0954      Component Value  Date/Time   CALCIUM 9.2 04/08/2016 0941   CALCIUM 9.3 12/20/2015 0509   ALKPHOS 41 04/08/2016 0941   ALKPHOS 63 10/08/2015 0954   AST 20 04/08/2016 0941   AST 20 10/08/2015 0954   ALT 12 04/08/2016 0941   ALT 10 10/08/2015 0954   BILITOT 0.38 04/08/2016 0941   BILITOT 0.5 10/08/2015 0954       Lab Results  Component Value Date   WBC 3.2* 04/08/2016   HGB 11.3* 04/08/2016   HCT 33.9* 04/08/2016   MCV 97.5 04/08/2016   PLT 250 04/08/2016   NEUTROABS 1.0* 04/08/2016     ASSESSMENT & PLAN:  Breast cancer of lower-outer quadrant of right female breast ([place]) Metastatic breast cancer PET-CT 01/14/16: Widespread metastatic disease right breast, right chest wall several nodules largest 3.7 cm, mediastinum, right internal mammary lymph node 2.2 cm, right pleural-based nodules 2.1cm and 2.7 cm, malignant pleural effusion, right common iliac lymph node, bone metastases in the thorax (sternum, manubrium, multiple thoracic vertebral bodies, multiple right-sided ribs)  Right chest wall mass biopsy 2:30 position 01/13/2016: Invasive ductal carcinoma focally involves skeletal muscle, grade 2, HER-2 negative ratio 1.08, ER/PR pending (Right breast cancer 1996 status post lumpectomy followed by chemotherapy followed by  radiation and tamoxifen 5 years (details are not available)) ---------------------------------------------------------------------------------------------------------------------------------------------------------- Radiology and pathology review: I discussed with the patient the details of the pathology report as well as the PET/CT scan negative reviewed the films with the patient.  Treatment plan: Ibrance with letrozole started 01/20/2016 , cycle 3 Ibrance 100 mg, Cycle 4 Ibrance 75 mg dose Ibrance toxicities: 1. Neutropenia: Today's ANC is 1000.  start the patient at 75 mg dose. Denies any hot flashes or myalgias related to letrozole.  Bone metastases: Xgeva q 4 weeks.  Patient currently takes calcium with vitamin D Hypertension: Instructed the patient to follow with her primary care physician Our plan is to obtain CT chest abdomen pelvis there are May 22. RTC in 4 weeks for lab check And Xgeva  No orders of the defined types were placed in this encounter.   The patient has a good understanding of the overall plan. she agrees with it. she will call with any problems that may develop before the next visit here.   Rulon Eisenmenger, MD 04/08/2016

## 2016-04-08 NOTE — Telephone Encounter (Signed)
APPT MADE AND AVS PRINTED. INFORM PATIENT THAT [facility] HAS LEFT MESSAGES ON VOICEMAIL TO [facility]  CT CAP. GAVE PT NUMBER TO RETURN CALL

## 2016-04-11 DIAGNOSIS — E784 Other hyperlipidemia: Secondary | ICD-10-CM | POA: Diagnosis not present

## 2016-04-11 DIAGNOSIS — I10 Essential (primary) hypertension: Secondary | ICD-10-CM | POA: Diagnosis not present

## 2016-04-13 ENCOUNTER — Telehealth: Payer: Self-pay | Admitting: *Deleted

## 2016-04-13 NOTE — Telephone Encounter (Signed)
CT scheduled for 6/2. Discussed with Dr. Lindi Adie. He said that if family chose this date then this is fine but if not then he would like to get the date moved up. Called and left message for patient's son [name] to call clinic.

## 2016-04-18 DIAGNOSIS — Z0000 Encounter for general adult medical examination without abnormal findings: Secondary | ICD-10-CM | POA: Diagnosis not present

## 2016-04-18 DIAGNOSIS — I700 Atherosclerosis of aorta: Secondary | ICD-10-CM | POA: Diagnosis not present

## 2016-04-18 DIAGNOSIS — R946 Abnormal results of thyroid function studies: Secondary | ICD-10-CM | POA: Diagnosis not present

## 2016-04-18 DIAGNOSIS — C50511 Malignant neoplasm of lower-outer quadrant of right female breast: Secondary | ICD-10-CM | POA: Diagnosis not present

## 2016-04-18 DIAGNOSIS — R3129 Other microscopic hematuria: Secondary | ICD-10-CM | POA: Diagnosis not present

## 2016-04-18 DIAGNOSIS — C7951 Secondary malignant neoplasm of bone: Secondary | ICD-10-CM | POA: Diagnosis not present

## 2016-04-18 DIAGNOSIS — M1990 Unspecified osteoarthritis, unspecified site: Secondary | ICD-10-CM | POA: Diagnosis not present

## 2016-04-18 DIAGNOSIS — J3089 Other allergic rhinitis: Secondary | ICD-10-CM | POA: Diagnosis not present

## 2016-04-18 DIAGNOSIS — K449 Diaphragmatic hernia without obstruction or gangrene: Secondary | ICD-10-CM | POA: Diagnosis not present

## 2016-04-21 DIAGNOSIS — Z1212 Encounter for screening for malignant neoplasm of rectum: Secondary | ICD-10-CM | POA: Diagnosis not present

## 2016-04-22 ENCOUNTER — Ambulatory Visit (HOSPITAL_COMMUNITY): Payer: Commercial Managed Care - HMO

## 2016-04-22 ENCOUNTER — Other Ambulatory Visit (HOSPITAL_COMMUNITY): Payer: Commercial Managed Care - HMO

## 2016-04-22 ENCOUNTER — Ambulatory Visit (HOSPITAL_COMMUNITY)
Admission: RE | Admit: 2016-04-22 | Discharge: 2016-04-22 | Disposition: A | Discharge status: Home / Self Care | Payer: Commercial Managed Care - HMO | Source: Ambulatory Visit | Attending: Hematology and Oncology | Admitting: Hematology and Oncology

## 2016-04-22 DIAGNOSIS — I7090 Unspecified atherosclerosis: Secondary | ICD-10-CM | POA: Diagnosis not present

## 2016-04-22 DIAGNOSIS — C50511 Malignant neoplasm of lower-outer quadrant of right female breast: Secondary | ICD-10-CM

## 2016-04-22 DIAGNOSIS — R935 Abnormal findings on diagnostic imaging of other abdominal regions, including retroperitoneum: Secondary | ICD-10-CM | POA: Diagnosis not present

## 2016-04-22 DIAGNOSIS — J811 Chronic pulmonary edema: Secondary | ICD-10-CM | POA: Insufficient documentation

## 2016-04-22 DIAGNOSIS — I517 Cardiomegaly: Secondary | ICD-10-CM | POA: Diagnosis not present

## 2016-04-22 DIAGNOSIS — J90 Pleural effusion, not elsewhere classified: Secondary | ICD-10-CM | POA: Insufficient documentation

## 2016-04-22 DIAGNOSIS — M899 Disorder of bone, unspecified: Secondary | ICD-10-CM | POA: Insufficient documentation

## 2016-04-22 MED ORDER — IOPAMIDOL (ISOVUE-300) INJECTION 61%
100.0000 mL | Freq: Once | INTRAVENOUS | Status: AC | PRN
  Administered 2016-04-22: 80 mL via INTRAVENOUS

## 2016-05-06 ENCOUNTER — Ambulatory Visit (HOSPITAL_COMMUNITY): Payer: Commercial Managed Care - HMO

## 2016-05-11 NOTE — Assessment & Plan Note (Signed)
Metastatic breast cancer PET-CT 01/14/16: Widespread metastatic disease right breast, right chest wall several nodules largest 3.7 cm, mediastinum, right internal mammary lymph node 2.2 cm, right pleural-based nodules 2.1cm and 2.7 cm, malignant pleural effusion, right common iliac lymph node, bone metastases in the thorax (sternum, manubrium, multiple thoracic vertebral bodies, multiple right-sided ribs)  Right chest wall mass biopsy 2:30 position 01/13/2016: Invasive ductal carcinoma focally involves skeletal muscle, grade 2, HER-2 negative ratio 1.08, ER/PR pending (Right breast cancer 1996 status post lumpectomy followed by chemotherapy followed by radiation and tamoxifen 5 years (details are not available)) ---------------------------------------------------------------------------------------------------------------------------------------------------------- Radiology and pathology review: I discussed with the patient the details of the pathology report as well as the PET/CT scan negative reviewed the films with the patient.  Treatment plan: Ibrance with letrozole started 01/20/2016 , cycle 3 Ibrance 100 mg, Cycle 4 Ibrance 75 mg dose Ibrance toxicities: 1. Neutropenia: Today's ANC is 1000.  Currently on 75 mg dose. Denies any hot flashes or myalgias related to letrozole.  Bone metastases: Xgeva q 4 weeks. Patient currently takes calcium with vitamin D Hypertension: Instructed the patient to follow with her primary care physician Our plan is to obtain CT chest abdomen pelvis there are May 22. RTC in 4 weeks for lab check And Delton See

## 2016-05-13 ENCOUNTER — Ambulatory Visit (HOSPITAL_BASED_OUTPATIENT_CLINIC_OR_DEPARTMENT_OTHER): Payer: Commercial Managed Care - HMO | Admitting: Hematology and Oncology

## 2016-05-13 ENCOUNTER — Ambulatory Visit (HOSPITAL_BASED_OUTPATIENT_CLINIC_OR_DEPARTMENT_OTHER): Payer: Commercial Managed Care - HMO

## 2016-05-13 ENCOUNTER — Encounter: Payer: Self-pay | Admitting: Hematology and Oncology

## 2016-05-13 ENCOUNTER — Telehealth: Payer: Self-pay | Admitting: Hematology and Oncology

## 2016-05-13 ENCOUNTER — Other Ambulatory Visit (HOSPITAL_BASED_OUTPATIENT_CLINIC_OR_DEPARTMENT_OTHER): Payer: Commercial Managed Care - HMO

## 2016-05-13 VITALS — BP 146/78 | HR 70 | Temp 98.2°F | Resp 18 | Ht <= 58 in | Wt 118.4 lb

## 2016-05-13 DIAGNOSIS — C7951 Secondary malignant neoplasm of bone: Secondary | ICD-10-CM

## 2016-05-13 DIAGNOSIS — C50511 Malignant neoplasm of lower-outer quadrant of right female breast: Secondary | ICD-10-CM

## 2016-05-13 DIAGNOSIS — Z79811 Long term (current) use of aromatase inhibitors: Secondary | ICD-10-CM

## 2016-05-13 DIAGNOSIS — D701 Agranulocytosis secondary to cancer chemotherapy: Secondary | ICD-10-CM

## 2016-05-13 DIAGNOSIS — C778 Secondary and unspecified malignant neoplasm of lymph nodes of multiple regions: Secondary | ICD-10-CM

## 2016-05-13 LAB — COMPREHENSIVE METABOLIC PANEL
ALBUMIN: 3.9 g/dL (ref 3.5–5.0)
ALK PHOS: 41 U/L (ref 40–150)
ALT: 16 U/L (ref 0–55)
AST: 23 U/L (ref 5–34)
Anion Gap: 7 mEq/L (ref 3–11)
BILIRUBIN TOTAL: 0.45 mg/dL (ref 0.20–1.20)
BUN: 14.3 mg/dL (ref 7.0–26.0)
CO2: 29 mEq/L (ref 22–29)
CREATININE: 0.8 mg/dL (ref 0.6–1.1)
Calcium: 9.3 mg/dL (ref 8.4–10.4)
Chloride: 100 mEq/L (ref 98–109)
EGFR: 70 mL/min/{1.73_m2} — ABNORMAL LOW (ref >90)
GLUCOSE: 92 mg/dL (ref 70–140)
Potassium: 4.8 mEq/L (ref 3.5–5.1)
SODIUM: 136 meq/L (ref 136–145)
TOTAL PROTEIN: 7.8 g/dL (ref 6.4–8.3)

## 2016-05-13 LAB — CBC WITH DIFFERENTIAL/PLATELET
BASO%: 1.2 % (ref 0.0–2.0)
Basophils Absolute: 0 10*3/uL (ref 0.0–0.1)
EOS ABS: 0.4 10*3/uL (ref 0.0–0.5)
EOS%: 10.2 % — AB (ref 0.0–7.0)
HEMATOCRIT: 33.6 % — AB (ref 34.8–46.6)
HEMOGLOBIN: 11.8 g/dL (ref 11.6–15.9)
LYMPH#: 1.7 10*3/uL (ref 0.9–3.3)
LYMPH%: 50.6 % — ABNORMAL HIGH (ref 14.0–49.7)
MCH: 34.5 pg — ABNORMAL HIGH (ref 25.1–34.0)
MCHC: 35.1 g/dL (ref 31.5–36.0)
MCV: 98.2 fL (ref 79.5–101.0)
MONO#: 0.4 10*3/uL (ref 0.1–0.9)
MONO%: 10.2 % (ref 0.0–14.0)
NEUT%: 27.8 % — ABNORMAL LOW (ref 38.4–76.8)
NEUTROS ABS: 1 10*3/uL — AB (ref 1.5–6.5)
Platelets: 218 10*3/uL (ref 145–400)
RBC: 3.42 10*6/uL — ABNORMAL LOW (ref 3.70–5.45)
RDW: 16.1 % — AB (ref 11.2–14.5)
WBC: 3.4 10*3/uL — AB (ref 3.9–10.3)

## 2016-05-13 MED ORDER — PALBOCICLIB 75 MG PO CAPS: 75.0000 mg | ORAL_CAPSULE | Freq: Every day | ORAL | Status: DC

## 2016-05-13 MED ORDER — DENOSUMAB 120 MG/1.7ML ~~LOC~~ SOLN
120.0000 mg | Freq: Once | SUBCUTANEOUS | Status: AC
  Administered 2016-05-13: 120 mg via SUBCUTANEOUS
  Filled 2016-05-13: qty 1.7

## 2016-05-13 MED FILL — *IBRANCE 75 MG CAPSULE: 75 | 28 days supply | Qty: 21 | Fill #0

## 2016-05-13 NOTE — Patient Instructions (Signed)
Denosumab injection ?y l thu?c g? DENOSUMAB lm ch?m s? suy y?u x??ng. Prolia ???c dng ?? ?i?u tr? ch?ng long x??ng ? ph? n? sau khi mn kinh v ? nam gi?i. Xgeva ???c dng ?? phng ng?a gy x??ng v cc v?n ?? khc v? x??ng gy ra b?i di c?n x??ng do ung th?. Xgeva c?ng ???c dng ?? ?i?u tr? b??u t? bo kh?ng l? (giant cell tumor) c?a x??ng. Thu?c ny c th? ???c dng cho nh?ng m?c ?ch khc; hy h?i ng??i cung c?p d?ch v? y t? ho?c d??c s? c?a mnh, n?u qu v? c th?c m?c. Ti c?n ph?i bo cho ng??i cung c?p d?ch v? y t? c?a mnh ?i?u g tr??c khi dng thu?c ny? H? c?n bi?t li?u qu v? c b?t k? tnh tr?ng no sau ?y khng: -b?nh v? r?ng -chm da -nhi?m trng ho?c ti?n s? nhi?m trng -b?nh th?n ho?c n?u qu v? ?ang ???c th?m phn -m?c calci ho?c vitamin D trong mu th?p -h?i ch?ng r?i lo?n h?p th? -???c x?p vo l?ch ?? gi?i ph?u ho?c nh? r?ng -?ang du?ng thu?c co? ch??a denosumab -b?nh tuy?n gip tr?ng ho?c tuy?n c?n gip tr?ng -ph?n ?ng b?t th??ng v?i denosumab -pha?n ??ng b?t th???ng ho??c di? ??ng v??i ca?c d??c ph?m kha?c -pha?n ??ng b?t th???ng ho??c di? ??ng v??i th??c ph?m, thu?c nhu?m, ho??c ch?t ba?o qua?n -?ang c thai ho??c ??nh co? thai -?ang cho con bu? Ti nn s? d?ng thu?c ny nh? th? no? Thu?c ny ?? tim d??i da. Thu?c ny ???c s? d?ng b?i chuyn vin y t? ? b?nh vi?n ho?c ? phng m?ch. N?u qu v? s?p dng thu?c Prolia, d??c s? s? ??a cho qu v? m?t B?n H??ng D?n v? D??c Ph?m (MedGuide) ??c bi?t cho m?i toa thu?c v cho m?i l?n mua thm thu?c ?. Hy b?o ??m ??c k? thng tin ny m?i l?n. ??i v?i Prolia, hy bn v?i bc s? nhi khoa c?a qu v? v? vi?c dng thu?c ny ? tr? em. C th? c?n ch?m sc ??c bi?t. ??i v?i Xgeva, hy bn v?i bc s? nhi khoa c?a qu v? v? vi?c dng thu?c ny ? tr? em. Thu?c ny c th? ???c k toa cho tr? em ch? m?i 13 tu?i trong nh?ng tr??ng h?p ch?n l?c, nh?ng c?n ph?i th?n tr?ng. Qu li?u: N?u qu v? cho r?ng mnh ? dng qu nhi?u  thu?c ny, th hy lin l?c v?i trung tm ki?m sot ch?t ??c ho?c phng c?p c?u ngay l?p t?c. L?U : Thu?c ny ch? dnh ring cho qu v?. Khng chia s? thu?c ny v?i nh?ng ng??i khc. N?u ti l? qun m?t li?u th sao? ?i?u quan tr?ng l khng nn b? l? li?u thu?c no. Hy lin l?c v?i bc s? ho?c chuyn vin y t? c?a mnh, n?u qu v? khng th? gi? ?ng cu?c h?n khm. Nh?ng g c th? t??ng tc v?i thu?c ny? Khng ???c dng thu?c ny cng v?i b?t k? th? no sau ?y: -nh??ng thu?c kha?c co? ch??a denosumab Thu?c ny c?ng c th? t??ng tc v?i cc thu?c sau ?y: -cc thu?c ?c ch? h? mi?n d?ch -cc thu?c ?i?u tr? b?nh ung th? -cc thu?c steroid, ch?ng h?n nh? prednisone ho?c cortisone Danh sch ny c th? khng m t? ?? h?t cc t??ng tc c th? x?y ra. Hy ??a cho ng??i cung c?p d?ch v? y t? c?a mnh danh sch t?t c? cc thu?c, th?o d??c, cc thu?c khng c?n toa, ho?c cc   ch? ph?m b? sung m qu v? dng. C?ng nn bo cho h? bi?t r?ng qu v? c ht thu?c, u?ng r??u, ho?c c s? d?ng ma ty tri php hay khng. Vi th? c th? t??ng tc v?i thu?c c?a qu v?. Ti c?n ph?i theo di ?i?u g trong khi dng thu?c ny? Hy ?i g?p bc s? ho?c chuyn vin y t? ?? theo di ??nh k? s? c?i thi?n c?a qu v?. Bc s? c th? yu c?u th? mu v lm cc xt nghi?m khc ?? theo di s? c?i thi?n c?a qu v?. Hy lin l?c v?i bc s? ho?c chuyn vin y t?, n?u qu v? b? c?m l?nh ho?c b? b?nh nhi?m trng khc trong khi dng thu?c ny. Khng ???c t? ?i?u tr? cho mnh. Thu?c ny c th? lm gi?m kh? n?ng ch?ng l?i cc b?nh nhi?m trng c?a c? th?. Qu v? c?n ph?i ch?c ch?n r?ng c? th? qu v? nh?n ???c ??y ?? l??ng calci v vitamin D trong th?i gian dng thu?c ny, tr? khi bc s? yu c?u qu v? ??ng lm nh? v?y. Hy th?o lu?n v?i bc s? ho?c chuyn vin y t? v? nh?ng th?c ph?m qu v? ?n v nh?ng vitamin qu v? dng. Hy ?i nha s? th??ng xuyn. ?nh r?ng ho?c x?a r?ng b?ng ch? nha khoa nh? ? ???c ch? d?n. N?u qu v? c ?i lm r?ng, th  hy bo v?i nha s? r?ng qu v? ?ang dng thu?c ny. Khng ???c c thai trong th?i gian dng thu?c ny ho?c trong vng 5 thng sau khi ng?ng dng thu?c. Ph? n? c?n ph?i thng bo cho bc s? c?a mnh, n?u mu?n c thai ho?c ngh? r?ng c th? mnh ? c thai. C nguy c? v? cc tc d?ng ph? nghim tr?ng ??i v?i thai nhi. Hy th?o lu?n v?i bc s? ho?c chuyn vin y t? ho?c d??c s? ?? bi?t thm thng tin. Ti c th? nh?n th?y nh?ng tc d?ng ph? no khi dng thu?c ny? Nh?ng tc d?ng ph? qu v? c?n ph?i bo cho bc s? ho?c chuyn vin y t? cng s?m cng t?t: -cc ph?n ?ng d? ?ng, ch?ng h?n nh? da b? m?n ??, ng?a, n?i my ?ay, s?ng ? m?t, mi, ho?c l??i -cc v?n ?? v? h h?p -?au ng?c -tim ??p nhanh ho?c khng ??u -c?m th?y chong vng, ng?t x?u, b? t -s?t, ?n l?nh, ho?c b?t k? d?u hi?u nhi?m trng no khc -co th?t, c?ng, ho?c gi?t c? b?p -t ho?c c?m gic nh? b? ki?n b -da b? r?p ho?c c u c?c, ho?c da b? kh, bong trc ho?c ?? -v?t th??ng lu lnh ho?c ?au khng r nguyn nhn ? mi?ng ho?c hm -b? b?m tm ho?c xu?t huy?t b?t th??ng Cc tc d?ng ph? khng c?n ph?i ch?m sc y t? (Hy bo cho bc s? ho?c chuyn vin y t? n?u cc tc d?ng ph? ny ti?p di?n ho?c gy phi?n toi): -?au c? b?p -kh ch?u bao t?, ??y h?i Danh sch ny c th? khng m t? ?? h?t cc tc d?ng ph? c th? x?y ra. Xin g?i t?i bc s? c?a mnh ?? ???c c? v?n chuyn mn v? cc tc d?ng ph?. Qu v? c th? t??ng trnh cc tc d?ng ph? cho FDA theo s? [phone]. Ti nn c?t gi? thu?c c?a mnh ? ?u? Thu?c ny ch? ???c dng trong phng m?ch, v?n phng bc s? ho?c c? s? y t? khc v s? khng ???c c?t gi?   t?i nh. L?U : ?y l b?n tm t?t. N c th? khng bao hm t?t c? thng tin c th? c. N?u qu v? th?c m?c v? thu?c ny, xin trao ??i v?i bc s?, d??c s?, ho?c ng??i cung c?p d?ch v? y t? c?a mnh.    2016, Elsevier/Gold Standard. (2012-07-05 00:00:00)  

## 2016-05-13 NOTE — Telephone Encounter (Signed)
per of tosch pt appt-mailed copy of avs-interperter left

## 2016-05-13 NOTE — Progress Notes (Signed)
Patient Care Team: [name], MD as PCP - General (Internal Medicine)  DIAGNOSIS: Metastatic breast cancer  SUMMARY OF ONCOLOGIC HISTORY:   Breast cancer of lower-outer quadrant of right female breast ([place])   01/04/1995 Surgery Right breast cancer status post lumpectomy followed by chemotherapy followed by radiation and tamoxifen 5 years (details are not available)   12/30/2015 Relapse/Recurrence Chest wall/ soft tissue masses near the right side of the sternum, superior nodule measuring 14.5 mm, inferior nodule measuring 10.5 mm, 26 mm anterior mediastinal soft tissue mass, T9 bone metastases   12/30/2015 Procedure Thoracentesis: 400 mL removed; showed reactive mesothelial cells, no cancer cells   12/30/2015 Imaging CT chest: 26 x 17 mm anterior mediastinal soft tissue mass, mixed lytic and sclerotic bone mets mainly involving sternum and T9 vertebral body, scattered vertebral body involvement and rib involvement; chest wall soft tissue masses 14.5 mm, 10.5 mm   01/13/2016 Initial Biopsy Right chest wall mass biopsy 2:30 position: Invasive ductal carcinoma focally involves skeletal muscle, grade 2, HER-2 negative ratio 1.08, ER/PR 95% positive   01/14/2016 PET scan Widespread metastatic disease right breast, right chest wall, mediastinum, right internal mammary lymph nodes, right pleural space, malignant pleural effusion, right common iliac lymph node, bone metastases in the thorax   01/21/2016 -  Anti-estrogen oral therapy Ibrance with letrozole    CHIEF COMPLIANT: Follow-up on Ibrance with letrozole after recent scans  INTERVAL HISTORY: Jessica Zavala is a 70 year old with above-mentioned history of metastatic breast cancer with bone metastases along with pleural effusion and lymph node metastases has been on Ibrance with letrozole. We had to reduce the dosage to 75 mg. She had scans done couple weeks ago and is here today to discuss the results. She has been feeling quite well. Has excellent  energy. Denies any pain issues. Denies any fatigue or nausea or vomiting.  REVIEW OF SYSTEMS:   Constitutional: Denies fevers, chills or abnormal weight loss Eyes: Denies blurriness of vision Ears, nose, mouth, throat, and face: Denies mucositis or sore throat Respiratory: Denies cough, dyspnea or wheezes Cardiovascular: Denies palpitation, chest discomfort Gastrointestinal:  Denies nausea, heartburn or change in bowel habits Skin: Denies abnormal skin rashes Lymphatics: Denies new lymphadenopathy or easy bruising Neurological:Denies numbness, tingling or new weaknesses Behavioral/Psych: Mood is stable, no new changes  Extremities: No lower extremity edema Breast:  denies any pain or lumps or nodules in either breasts All other systems were reviewed with the patient and are negative.  I have reviewed the past medical history, past surgical history, social history and family history with the patient and they are unchanged from previous note.  ALLERGIES:  is allergic to aspirin.  MEDICATIONS:  Current Outpatient Prescriptions  Medication Sig Dispense Refill  . acetaminophen (TYLENOL) 325 MG tablet Take 650 mg by mouth every 6 (six) hours as needed for mild pain.    . cromolyn (OPTICROM) 4 % ophthalmic solution Place 1 drop into the right eye 4 (four) times daily. Reported on 02/02/2016    . fluorometholone (FML) 0.1 % ophthalmic suspension Reported on 02/02/2016    . labetalol (NORMODYNE) 200 MG tablet Take 200 mg by mouth 2 (two) times daily.    Marland Kitchen letrozole (FEMARA) 2.5 MG tablet Take 1 tablet (2.5 mg total) by mouth daily. 90 tablet 3  . losartan (COZAAR) 100 MG tablet     . magic mouthwash w/lidocaine SOLN Take 5 mLs by mouth 3 (three) times daily as needed for mouth pain. 50 mL 0  .  Menthol, Topical Analgesic, (BIOFREEZE COLORLESS) 4 % GEL Use in affected area TID prn (Patient not taking: Reported on 02/02/2016) 100 mL 0  . olmesartan (BENICAR) 40 MG tablet Take 40 mg by mouth daily.     . Omega-3 Fatty Acids (FISH OIL PO) Take 1 capsule by mouth daily.    . palbociclib (IBRANCE) 75 MG capsule Take 1 capsule (75 mg total) by mouth daily with breakfast. 21 capsule 0  . phenazopyridine (PYRIDIUM) 200 MG tablet Take 1 tablet (200 mg total) by mouth 3 (three) times daily. (Patient not taking: Reported on 02/02/2016) 6 tablet 0  . simvastatin (ZOCOR) 40 MG tablet Take 40 mg by mouth daily.     No current facility-administered medications for this visit.    PHYSICAL EXAMINATION: ECOG PERFORMANCE STATUS: 0 - Asymptomatic  Filed Vitals:   05/13/16 0830  BP: 146/78  Pulse: 70  Temp: 98.2 F (36.8 C)  Resp: 18   Filed Weights   05/13/16 0830  Weight: 118 lb 6.4 oz (53.706 kg)    GENERAL:alert, no distress and comfortable SKIN: skin color, texture, turgor are normal, no rashes or significant lesions EYES: normal, Conjunctiva are pink and non-injected, sclera clear OROPHARYNX:no exudate, no erythema and lips, buccal mucosa, and tongue normal  NECK: supple, thyroid normal size, non-tender, without nodularity LYMPH:  no palpable lymphadenopathy in the cervical, axillary or inguinal LUNGS: clear to auscultation and percussion with normal breathing effort HEART: regular rate & rhythm and no murmurs and no lower extremity edema ABDOMEN:abdomen soft, non-tender and normal bowel sounds MUSCULOSKELETAL:no cyanosis of digits and no clubbing  NEURO: alert & oriented x 3 with fluent speech, no focal motor/sensory deficits EXTREMITIES: No lower extremity edema  LABORATORY DATA:  I have reviewed the data as listed   Chemistry      Component Value Date/Time   NA 138 04/08/2016 0941   NA 136 12/20/2015 0509   K 4.4 04/08/2016 0941   K 3.8 12/20/2015 0509   CL 102 12/20/2015 0509   CO2 29 04/08/2016 0941   CO2 27 12/20/2015 0509   BUN 12.5 04/08/2016 0941   BUN 19 12/20/2015 0509   CREATININE 0.8 04/08/2016 0941   CREATININE 0.71 12/20/2015 0509   CREATININE 0.66 10/08/2015  0954      Component Value Date/Time   CALCIUM 9.2 04/08/2016 0941   CALCIUM 9.3 12/20/2015 0509   ALKPHOS 41 04/08/2016 0941   ALKPHOS 63 10/08/2015 0954   AST 20 04/08/2016 0941   AST 20 10/08/2015 0954   ALT 12 04/08/2016 0941   ALT 10 10/08/2015 0954   BILITOT 0.38 04/08/2016 0941   BILITOT 0.5 10/08/2015 0954       Lab Results  Component Value Date   WBC 3.4* 05/13/2016   HGB 11.8 05/13/2016   HCT 33.6* 05/13/2016   MCV 98.2 05/13/2016   PLT 218 05/13/2016   NEUTROABS 1.0* 05/13/2016     ASSESSMENT & PLAN:  Breast cancer of lower-outer quadrant of right female breast ([place]) Metastatic breast cancer PET-CT 01/14/16: Widespread metastatic disease right breast, right chest wall several nodules largest 3.7 cm, mediastinum, right internal mammary lymph node 2.2 cm, right pleural-based nodules 2.1cm and 2.7 cm, malignant pleural effusion, right common iliac lymph node, bone metastases in the thorax (sternum, manubrium, multiple thoracic vertebral bodies, multiple right-sided ribs)  Right chest wall mass biopsy 2:30 position 01/13/2016: Invasive ductal carcinoma focally involves skeletal muscle, grade 2, HER-2 negative ratio 1.08, ER/PR pending (Right breast cancer 1996 status  post lumpectomy followed by chemotherapy followed by radiation and tamoxifen 5 years (details are not available)) ---------------------------------------------------------------------------------------------------------------------------------------------------------- Radiology and pathology review: I discussed with the patient the details of the pathology report as well as the PET/CT scan negative reviewed the films with the patient.  Treatment plan: Ibrance with letrozole started 01/20/2016 , cycle 3 Ibrance 100 mg, Cycle 4 Ibrance 75 mg dose Ibrance toxicities: 1. Neutropenia: Today's ANC is 1000.  Currently on 75 mg dose. Denies any hot flashes or myalgias related to letrozole.  Bone metastases:  Xgeva q 4 weeks. Patient currently takes calcium with vitamin D Hypertension: Instructed the patient to follow with her primary care physician CT chest abdomen pelvis: Improvement in lymph nodes as well as bone metastases. No further pleural effusion. Excellent response to treatment.  RTC in 8 weeks for lab check and every 4 weeks for Xgeva   No orders of the defined types were placed in this encounter.   The patient has a good understanding of the overall plan. she agrees with it. she will call with any problems that may develop before the next visit here.   Rulon Eisenmenger, MD 05/13/2016

## 2016-06-03 ENCOUNTER — Other Ambulatory Visit: Payer: Self-pay | Admitting: *Deleted

## 2016-06-03 DIAGNOSIS — C50511 Malignant neoplasm of lower-outer quadrant of right female breast: Secondary | ICD-10-CM

## 2016-06-10 ENCOUNTER — Ambulatory Visit (HOSPITAL_BASED_OUTPATIENT_CLINIC_OR_DEPARTMENT_OTHER): Payer: Commercial Managed Care - HMO

## 2016-06-10 ENCOUNTER — Other Ambulatory Visit (HOSPITAL_BASED_OUTPATIENT_CLINIC_OR_DEPARTMENT_OTHER): Payer: Commercial Managed Care - HMO

## 2016-06-10 VITALS — BP 130/71 | HR 73 | Temp 97.8°F | Resp 20

## 2016-06-10 DIAGNOSIS — C7951 Secondary malignant neoplasm of bone: Secondary | ICD-10-CM | POA: Diagnosis not present

## 2016-06-10 DIAGNOSIS — C50511 Malignant neoplasm of lower-outer quadrant of right female breast: Secondary | ICD-10-CM

## 2016-06-10 LAB — COMPREHENSIVE METABOLIC PANEL
ALK PHOS: 38 U/L — AB (ref 40–150)
ALT: 16 U/L (ref 0–55)
ANION GAP: 8 meq/L (ref 3–11)
AST: 25 U/L (ref 5–34)
Albumin: 3.6 g/dL (ref 3.5–5.0)
BILIRUBIN TOTAL: 0.34 mg/dL (ref 0.20–1.20)
BUN: 13.6 mg/dL (ref 7.0–26.0)
CO2: 28 meq/L (ref 22–29)
CREATININE: 0.8 mg/dL (ref 0.6–1.1)
Calcium: 9.4 mg/dL (ref 8.4–10.4)
Chloride: 101 mEq/L (ref 98–109)
EGFR: 75 mL/min/{1.73_m2} — AB (ref >90)
GLUCOSE: 104 mg/dL (ref 70–140)
Potassium: 4.7 mEq/L (ref 3.5–5.1)
Sodium: 138 mEq/L (ref 136–145)
TOTAL PROTEIN: 7.3 g/dL (ref 6.4–8.3)

## 2016-06-10 LAB — CBC WITH DIFFERENTIAL/PLATELET
BASO%: 0.5 % (ref 0.0–2.0)
BASOS ABS: 0 10*3/uL (ref 0.0–0.1)
EOS ABS: 0.2 10*3/uL (ref 0.0–0.5)
EOS%: 10 % — ABNORMAL HIGH (ref 0.0–7.0)
HEMATOCRIT: 31.4 % — AB (ref 34.8–46.6)
HEMOGLOBIN: 11.1 g/dL — AB (ref 11.6–15.9)
LYMPH#: 1.1 10*3/uL (ref 0.9–3.3)
LYMPH%: 56.2 % — ABNORMAL HIGH (ref 14.0–49.7)
MCH: 35 pg — ABNORMAL HIGH (ref 25.1–34.0)
MCHC: 35.4 g/dL (ref 31.5–36.0)
MCV: 99.1 fL (ref 79.5–101.0)
MONO#: 0.2 10*3/uL (ref 0.1–0.9)
MONO%: 9.5 % (ref 0.0–14.0)
NEUT#: 0.5 10*3/uL — CL (ref 1.5–6.5)
NEUT%: 23.8 % — ABNORMAL LOW (ref 38.4–76.8)
PLATELETS: 173 10*3/uL (ref 145–400)
RBC: 3.17 10*6/uL — AB (ref 3.70–5.45)
RDW: 14.6 % — AB (ref 11.2–14.5)
WBC: 2 10*3/uL — ABNORMAL LOW (ref 3.9–10.3)

## 2016-06-10 MED ORDER — DENOSUMAB 120 MG/1.7ML ~~LOC~~ SOLN
120.0000 mg | Freq: Once | SUBCUTANEOUS | Status: AC
  Administered 2016-06-10: 120 mg via SUBCUTANEOUS
  Filled 2016-06-10: qty 1.7

## 2016-06-10 MED FILL — *IBRANCE 75 MG CAPSULE: 75 | 28 days supply | Qty: 21 | Fill #1

## 2016-06-10 NOTE — Patient Instructions (Signed)
Denosumab injection ?y l thu?c g? DENOSUMAB lm ch?m s? suy y?u x??ng. Prolia ???c dng ?? ?i?u tr? ch?ng long x??ng ? ph? n? sau khi mn kinh v ? nam gi?i. Xgeva ???c dng ?? phng ng?a gy x??ng v cc v?n ?? khc v? x??ng gy ra b?i di c?n x??ng do ung th?. Xgeva c?ng ???c dng ?? ?i?u tr? b??u t? bo kh?ng l? (giant cell tumor) c?a x??ng. Thu?c ny c th? ???c dng cho nh?ng m?c ?ch khc; hy h?i ng??i cung c?p d?ch v? y t? ho?c d??c s? c?a mnh, n?u qu v? c th?c m?c. Ti c?n ph?i bo cho ng??i cung c?p d?ch v? y t? c?a mnh ?i?u g tr??c khi dng thu?c ny? H? c?n bi?t li?u qu v? c b?t k? tnh tr?ng no sau ?y khng: -b?nh v? r?ng -chm da -nhi?m trng ho?c ti?n s? nhi?m trng -b?nh th?n ho?c n?u qu v? ?ang ???c th?m phn -m?c calci ho?c vitamin D trong mu th?p -h?i ch?ng r?i lo?n h?p th? -???c x?p vo l?ch ?? gi?i ph?u ho?c nh? r?ng -?ang du?ng thu?c co? ch??a denosumab -b?nh tuy?n gip tr?ng ho?c tuy?n c?n gip tr?ng -ph?n ?ng b?t th??ng v?i denosumab -pha?n ??ng b?t th???ng ho??c di? ??ng v??i ca?c d??c ph?m kha?c -pha?n ??ng b?t th???ng ho??c di? ??ng v??i th??c ph?m, thu?c nhu?m, ho??c ch?t ba?o qua?n -?ang c thai ho??c ??nh co? thai -?ang cho con bu? Ti nn s? d?ng thu?c ny nh? th? no? Thu?c ny ?? tim d??i da. Thu?c ny ???c s? d?ng b?i chuyn vin y t? ? b?nh vi?n ho?c ? phng m?ch. N?u qu v? s?p dng thu?c Prolia, d??c s? s? ??a cho qu v? m?t B?n H??ng D?n v? D??c Ph?m (MedGuide) ??c bi?t cho m?i toa thu?c v cho m?i l?n mua thm thu?c ?. Hy b?o ??m ??c k? thng tin ny m?i l?n. ??i v?i Prolia, hy bn v?i bc s? nhi khoa c?a qu v? v? vi?c dng thu?c ny ? tr? em. C th? c?n ch?m sc ??c bi?t. ??i v?i Xgeva, hy bn v?i bc s? nhi khoa c?a qu v? v? vi?c dng thu?c ny ? tr? em. Thu?c ny c th? ???c k toa cho tr? em ch? m?i 13 tu?i trong nh?ng tr??ng h?p ch?n l?c, nh?ng c?n ph?i th?n tr?ng. Qu li?u: N?u qu v? cho r?ng mnh ? dng qu nhi?u  thu?c ny, th hy lin l?c v?i trung tm ki?m sot ch?t ??c ho?c phng c?p c?u ngay l?p t?c. L?U : Thu?c ny ch? dnh ring cho qu v?. Khng chia s? thu?c ny v?i nh?ng ng??i khc. N?u ti l? qun m?t li?u th sao? ?i?u quan tr?ng l khng nn b? l? li?u thu?c no. Hy lin l?c v?i bc s? ho?c chuyn vin y t? c?a mnh, n?u qu v? khng th? gi? ?ng cu?c h?n khm. Nh?ng g c th? t??ng tc v?i thu?c ny? Khng ???c dng thu?c ny cng v?i b?t k? th? no sau ?y: -nh??ng thu?c kha?c co? ch??a denosumab Thu?c ny c?ng c th? t??ng tc v?i cc thu?c sau ?y: -cc thu?c ?c ch? h? mi?n d?ch -cc thu?c ?i?u tr? b?nh ung th? -cc thu?c steroid, ch?ng h?n nh? prednisone ho?c cortisone Danh sch ny c th? khng m t? ?? h?t cc t??ng tc c th? x?y ra. Hy ??a cho ng??i cung c?p d?ch v? y t? c?a mnh danh sch t?t c? cc thu?c, th?o d??c, cc thu?c khng c?n toa, ho?c cc   ch? ph?m b? sung m qu v? dng. C?ng nn bo cho h? bi?t r?ng qu v? c ht thu?c, u?ng r??u, ho?c c s? d?ng ma ty tri php hay khng. Vi th? c th? t??ng tc v?i thu?c c?a qu v?. Ti c?n ph?i theo di ?i?u g trong khi dng thu?c ny? Hy ?i g?p bc s? ho?c chuyn vin y t? ?? theo di ??nh k? s? c?i thi?n c?a qu v?. Bc s? c th? yu c?u th? mu v lm cc xt nghi?m khc ?? theo di s? c?i thi?n c?a qu v?. Hy lin l?c v?i bc s? ho?c chuyn vin y t?, n?u qu v? b? c?m l?nh ho?c b? b?nh nhi?m trng khc trong khi dng thu?c ny. Khng ???c t? ?i?u tr? cho mnh. Thu?c ny c th? lm gi?m kh? n?ng ch?ng l?i cc b?nh nhi?m trng c?a c? th?. Qu v? c?n ph?i ch?c ch?n r?ng c? th? qu v? nh?n ???c ??y ?? l??ng calci v vitamin D trong th?i gian dng thu?c ny, tr? khi bc s? yu c?u qu v? ??ng lm nh? v?y. Hy th?o lu?n v?i bc s? ho?c chuyn vin y t? v? nh?ng th?c ph?m qu v? ?n v nh?ng vitamin qu v? dng. Hy ?i nha s? th??ng xuyn. ?nh r?ng ho?c x?a r?ng b?ng ch? nha khoa nh? ? ???c ch? d?n. N?u qu v? c ?i lm r?ng, th  hy bo v?i nha s? r?ng qu v? ?ang dng thu?c ny. Khng ???c c thai trong th?i gian dng thu?c ny ho?c trong vng 5 thng sau khi ng?ng dng thu?c. Ph? n? c?n ph?i thng bo cho bc s? c?a mnh, n?u mu?n c thai ho?c ngh? r?ng c th? mnh ? c thai. C nguy c? v? cc tc d?ng ph? nghim tr?ng ??i v?i thai nhi. Hy th?o lu?n v?i bc s? ho?c chuyn vin y t? ho?c d??c s? ?? bi?t thm thng tin. Ti c th? nh?n th?y nh?ng tc d?ng ph? no khi dng thu?c ny? Nh?ng tc d?ng ph? qu v? c?n ph?i bo cho bc s? ho?c chuyn vin y t? cng s?m cng t?t: -cc ph?n ?ng d? ?ng, ch?ng h?n nh? da b? m?n ??, ng?a, n?i my ?ay, s?ng ? m?t, mi, ho?c l??i -cc v?n ?? v? h h?p -?au ng?c -tim ??p nhanh ho?c khng ??u -c?m th?y chong vng, ng?t x?u, b? t -s?t, ?n l?nh, ho?c b?t k? d?u hi?u nhi?m trng no khc -co th?t, c?ng, ho?c gi?t c? b?p -t ho?c c?m gic nh? b? ki?n b -da b? r?p ho?c c u c?c, ho?c da b? kh, bong trc ho?c ?? -v?t th??ng lu lnh ho?c ?au khng r nguyn nhn ? mi?ng ho?c hm -b? b?m tm ho?c xu?t huy?t b?t th??ng Cc tc d?ng ph? khng c?n ph?i ch?m sc y t? (Hy bo cho bc s? ho?c chuyn vin y t? n?u cc tc d?ng ph? ny ti?p di?n ho?c gy phi?n toi): -?au c? b?p -kh ch?u bao t?, ??y h?i Danh sch ny c th? khng m t? ?? h?t cc tc d?ng ph? c th? x?y ra. Xin g?i t?i bc s? c?a mnh ?? ???c c? v?n chuyn mn v? cc tc d?ng ph?. Qu v? c th? t??ng trnh cc tc d?ng ph? cho FDA theo s? [phone]. Ti nn c?t gi? thu?c c?a mnh ? ?u? Thu?c ny ch? ???c dng trong phng m?ch, v?n phng bc s? ho?c c? s? y t? khc v s? khng ???c c?t gi?   t?i nh. L?U : ?y l b?n tm t?t. N c th? khng bao hm t?t c? thng tin c th? c. N?u qu v? th?c m?c v? thu?c ny, xin trao ??i v?i bc s?, d??c s?, ho?c ng??i cung c?p d?ch v? y t? c?a mnh.    2016, Elsevier/Gold Standard. (2012-07-05 00:00:00)  

## 2016-06-10 NOTE — Progress Notes (Signed)
Received critical lab result of ANC 0.5.  Pt here for Xgeva injection.  Spoke with pt in lobby to discuss oral Ibrance therapy.  Per interpreter, pt is currently on "off week" as of 06/07/16.  Pt to resume Ibrance on 06/14/16.  Explained through interpreter that labs reviewed with Dr. Lindi Adie and pt is to continue with regimen as currently prescribed.  Reviewed neutropenic precautions with pt/interpreter/family and gave several masks to pt for added protection.  Pt without questions at end of conversation and instructed to call us with any additional questions should they arise.

## 2016-06-24 ENCOUNTER — Ambulatory Visit (INDEPENDENT_AMBULATORY_CARE_PROVIDER_SITE_OTHER): Payer: Commercial Managed Care - HMO | Admitting: Physician Assistant

## 2016-06-24 VITALS — BP 118/74 | HR 74 | Temp 98.3°F | Resp 16 | Ht <= 58 in | Wt 118.0 lb

## 2016-06-24 DIAGNOSIS — K1370 Unspecified lesions of oral mucosa: Secondary | ICD-10-CM

## 2016-06-24 DIAGNOSIS — D708 Other neutropenia: Secondary | ICD-10-CM | POA: Diagnosis not present

## 2016-06-24 LAB — POCT CBC
GRANULOCYTE PERCENT: 49.3 % (ref 37–80)
HCT, POC: 33.6 % — AB (ref 37.7–47.9)
Hemoglobin: 12.1 g/dL — AB (ref 12.2–16.2)
LYMPH, POC: 1.3 (ref 0.6–3.4)
MCH, POC: 35.5 pg — AB (ref 27–31.2)
MCHC: 35.9 g/dL — AB (ref 31.8–35.4)
MCV: 98.7 fL — AB (ref 80–97)
MID (CBC): 0.2 (ref 0–0.9)
MPV: 5.7 fL (ref 0–99.8)
PLATELET COUNT, POC: 352 10*3/uL (ref 142–424)
POC Granulocyte: 1.5 — AB (ref 2–6.9)
POC LYMPH %: 43.7 % (ref 10–50)
POC MID %: 7 %M (ref 0–12)
RBC: 3.4 M/uL — AB (ref 4.04–5.48)
RDW, POC: 15.7 %
WBC: 3 10*3/uL — AB (ref 4.6–10.2)

## 2016-06-24 MED ORDER — AMOXICILLIN 500 MG PO CAPS: 500.0000 mg | ORAL_CAPSULE | Freq: Two times a day (BID) | ORAL | Status: AC

## 2016-06-24 MED ORDER — MAGIC MOUTHWASH W/LIDOCAINE: 5.0000 mL | Freq: Four times a day (QID) | ORAL | Status: DC | PRN

## 2016-06-24 MED ORDER — TRIAMCINOLONE ACETONIDE 0.1 % MT PSTE: 1.0000 "application " | PASTE | Freq: Two times a day (BID) | OROMUCOSAL | Status: DC

## 2016-06-24 NOTE — Progress Notes (Signed)
[facility] [address] [place] [address] [phone]  Date:  06/24/2016   Name:  Jessica Zavala   DOB:  05/23/1946   MRN:  [id number]  PCP:  Precious Reel, MD    History of Present Illness:  Jessica Zavala is a 70 y.o. female patient with PMH of metastatic breast cancer who presents to [facility] for cc of oral swelling. Patient reports pain of left lower gum for the last week.  She states it hurts at the gum.  She has attempted to rub it with cotton, and experiences bleeding.  She believes she bit into a hard bread, and this may have been the catalyst to the gum pain.  No fever, or fatigue.  She currently is on oncologic therapies.  No current radiation or chemo in place.  She has hx of neutropenia.  No diarrhea.    Patient Active Problem List   Diagnosis Date Noted  . Bone metastases ([place]) 02/02/2016  . Breast cancer of lower-outer quadrant of right female breast ([place]) 01/05/2016  . Eczema of hand 11/15/2013  . Hypercholesterolemia 06/14/2012  . Hypertension 01/06/2012  . Eczema 01/06/2012    Past Medical History  Diagnosis Date  . Cancer ([place])     Right side, sp lumpectomy, rxt and chemo  . Hyperlipidemia   . Hypertension   . High cholesterol     Past Surgical History  Procedure Laterality Date  . Breast surgery      Social History  Substance Use Topics  . Smoking status: Never Smoker   . Smokeless tobacco: Never Used  . Alcohol Use: No    History reviewed. No pertinent family history.  Allergies  Allergen Reactions  . Aspirin Swelling    Mouth and facial swelling    Medication list has been reviewed and updated.  Current Outpatient Prescriptions on File Prior to Visit  Medication Sig Dispense Refill  . acetaminophen (TYLENOL) 325 MG tablet Take 650 mg by mouth every 6 (six) hours as needed for mild pain.    Marland Kitchen labetalol (NORMODYNE) 200 MG tablet Take 200 mg by mouth 2 (two) times daily.    Marland Kitchen letrozole (FEMARA) 2.5 MG tablet Take 1 tablet  (2.5 mg total) by mouth daily. 90 tablet 3  . losartan (COZAAR) 100 MG tablet     . Omega-3 Fatty Acids (FISH OIL PO) Take 1 capsule by mouth daily.    . palbociclib (IBRANCE) 75 MG capsule Take 1 capsule (75 mg total) by mouth daily with breakfast. 21 capsule 3  . cromolyn (OPTICROM) 4 % ophthalmic solution Place 1 drop into the right eye 4 (four) times daily. Reported on 06/24/2016    . fluorometholone (FML) 0.1 % ophthalmic suspension Reported on 06/24/2016    . magic mouthwash w/lidocaine SOLN Take 5 mLs by mouth 3 (three) times daily as needed for mouth pain. (Patient not taking: Reported on 06/24/2016) 50 mL 0  . Menthol, Topical Analgesic, (BIOFREEZE COLORLESS) 4 % GEL Use in affected area TID prn (Patient not taking: Reported on 02/02/2016) 100 mL 0  . olmesartan (BENICAR) 40 MG tablet Take 40 mg by mouth daily. Reported on 06/24/2016    . phenazopyridine (PYRIDIUM) 200 MG tablet Take 1 tablet (200 mg total) by mouth 3 (three) times daily. (Patient not taking: Reported on 02/02/2016) 6 tablet 0  . simvastatin (ZOCOR) 40 MG tablet Take 40 mg by mouth daily. Reported on 06/24/2016     No current facility-administered medications  on file prior to visit.    ROS ROS otherwise unremarkable unless listed above.   Physical Examination: BP 118/74 mmHg  Pulse 74  Temp(Src) 98.3 F (36.8 C) (Oral)  Resp 16  Ht 4\' 10"  (1.473 m)  Wt 118 lb (53.524 kg)  BMI 24.67 kg/m2  SpO2 98% Ideal Body Weight: Weight in (lb) to have BMI = 25: 119.4  Physical Exam  Constitutional: She is oriented to person, place, and time. She appears well-developed and well-nourished. No distress.  HENT:  Head: Normocephalic and atraumatic.  Right Ear: External ear normal.  Left Ear: External ear normal.  Lower dentition with erythematous 1cm diameter with pale line at center.  Tender upon palpation, and along the external area of this gumline.   Eyes: Conjunctivae and EOM are normal. Pupils are equal, round, and  reactive to light.  Cardiovascular: Normal rate.   Pulmonary/Chest: Effort normal. No respiratory distress.  Lymphadenopathy:       Head (right side): No submental, no submandibular and no tonsillar adenopathy present.       Head (left side): No submental, no submandibular and no tonsillar adenopathy present.    She has no cervical adenopathy.  Neurological: She is alert and oriented to person, place, and time.  Skin: She is not diaphoretic.  Psychiatric: She has a normal mood and affect. Her behavior is normal.     Assessment and Plan: Jessica Zavala is a 70 y.o. female who is here today for mouth lesion. -possible apthous ulcer, oral abrasion, --given magic mouthwash and kenalog orabase to treat supportively.  --she will start amoxicillin in 3 days if her symptoms do not improve.      Other neutropenia ([place]) - Plan: POCT CBC, amoxicillin (AMOXIL) 500 MG capsule, magic mouthwash w/lidocaine SOLN, triamcinolone (KENALOG) 0.1 % paste  Mouth lesion - Plan: amoxicillin (AMOXIL) 500 MG capsule, magic mouthwash w/lidocaine SOLN, triamcinolone (KENALOG) 0.1 % paste  [name], PA-C [facility] 06/24/2016 8:39 AM

## 2016-06-24 NOTE — Patient Instructions (Addendum)
     IF you received an x-ray today, you will receive an invoice from [facility]. Please contact [facility] at [phone] with questions or concerns regarding your invoice.   IF you received labwork today, you will receive an invoice from [employer]. Please contact [facility] at [phone] with questions or concerns regarding your invoice.   Our billing staff will not be able to assist you with questions regarding bills from these companies.  You will be contacted with the lab results as soon as they are available. The fastest way to get your results is to activate your My Chart account. Instructions are located on the last page of this paperwork. If you have not heard from [place] regarding the results in 2 weeks, please contact this office.    Please try the oral mouthwash and the topical cream first.  If this is not getting any better in the next 3 days, start the amoxicillin and take to completion.

## 2016-07-08 ENCOUNTER — Telehealth: Payer: Self-pay | Admitting: Hematology and Oncology

## 2016-07-08 ENCOUNTER — Ambulatory Visit: Payer: Commercial Managed Care - HMO

## 2016-07-08 ENCOUNTER — Encounter: Payer: Self-pay | Admitting: Hematology and Oncology

## 2016-07-08 ENCOUNTER — Other Ambulatory Visit (HOSPITAL_BASED_OUTPATIENT_CLINIC_OR_DEPARTMENT_OTHER): Payer: Commercial Managed Care - HMO

## 2016-07-08 ENCOUNTER — Ambulatory Visit (HOSPITAL_BASED_OUTPATIENT_CLINIC_OR_DEPARTMENT_OTHER): Payer: Commercial Managed Care - HMO

## 2016-07-08 ENCOUNTER — Ambulatory Visit (HOSPITAL_BASED_OUTPATIENT_CLINIC_OR_DEPARTMENT_OTHER): Payer: Commercial Managed Care - HMO | Admitting: Hematology and Oncology

## 2016-07-08 DIAGNOSIS — C7951 Secondary malignant neoplasm of bone: Secondary | ICD-10-CM

## 2016-07-08 DIAGNOSIS — C50511 Malignant neoplasm of lower-outer quadrant of right female breast: Secondary | ICD-10-CM | POA: Diagnosis not present

## 2016-07-08 DIAGNOSIS — Z79811 Long term (current) use of aromatase inhibitors: Secondary | ICD-10-CM

## 2016-07-08 DIAGNOSIS — J910 Malignant pleural effusion: Secondary | ICD-10-CM | POA: Diagnosis not present

## 2016-07-08 DIAGNOSIS — C778 Secondary and unspecified malignant neoplasm of lymph nodes of multiple regions: Secondary | ICD-10-CM | POA: Diagnosis not present

## 2016-07-08 DIAGNOSIS — D701 Agranulocytosis secondary to cancer chemotherapy: Secondary | ICD-10-CM

## 2016-07-08 LAB — COMPREHENSIVE METABOLIC PANEL
ALBUMIN: 3.7 g/dL (ref 3.5–5.0)
ALK PHOS: 41 U/L (ref 40–150)
ALT: 11 U/L (ref 0–55)
AST: 18 U/L (ref 5–34)
Anion Gap: 8 mEq/L (ref 3–11)
BUN: 15.9 mg/dL (ref 7.0–26.0)
CO2: 28 mEq/L (ref 22–29)
CREATININE: 0.8 mg/dL (ref 0.6–1.1)
Calcium: 9.6 mg/dL (ref 8.4–10.4)
Chloride: 102 mEq/L (ref 98–109)
EGFR: 73 mL/min/{1.73_m2} — ABNORMAL LOW (ref >90)
GLUCOSE: 94 mg/dL (ref 70–140)
POTASSIUM: 4.8 meq/L (ref 3.5–5.1)
Sodium: 138 mEq/L (ref 136–145)
TOTAL PROTEIN: 7.6 g/dL (ref 6.4–8.3)
Total Bilirubin: 0.49 mg/dL (ref 0.20–1.20)

## 2016-07-08 LAB — CBC WITH DIFFERENTIAL/PLATELET
BASO%: 1.3 % (ref 0.0–2.0)
Basophils Absolute: 0 10*3/uL (ref 0.0–0.1)
EOS ABS: 0.2 10*3/uL (ref 0.0–0.5)
EOS%: 9.1 % — ABNORMAL HIGH (ref 0.0–7.0)
HCT: 34.6 % — ABNORMAL LOW (ref 34.8–46.6)
HGB: 11.6 g/dL (ref 11.6–15.9)
LYMPH#: 1.1 10*3/uL (ref 0.9–3.3)
LYMPH%: 54 % — AB (ref 14.0–49.7)
MCH: 34.7 pg — ABNORMAL HIGH (ref 25.1–34.0)
MCHC: 33.6 g/dL (ref 31.5–36.0)
MCV: 103.2 fL — AB (ref 79.5–101.0)
MONO#: 0.1 10*3/uL (ref 0.1–0.9)
MONO%: 6.9 % (ref 0.0–14.0)
NEUT#: 0.6 10*3/uL — ABNORMAL LOW (ref 1.5–6.5)
NEUT%: 28.7 % — AB (ref 38.4–76.8)
PLATELETS: 195 10*3/uL (ref 145–400)
RBC: 3.35 10*6/uL — AB (ref 3.70–5.45)
RDW: 15.6 % — ABNORMAL HIGH (ref 11.2–14.5)
WBC: 2.1 10*3/uL — AB (ref 3.9–10.3)

## 2016-07-08 MED ORDER — DENOSUMAB 120 MG/1.7ML ~~LOC~~ SOLN
120.0000 mg | Freq: Once | SUBCUTANEOUS | Status: AC
  Administered 2016-07-08: 120 mg via SUBCUTANEOUS
  Filled 2016-07-08: qty 1.7

## 2016-07-08 NOTE — Progress Notes (Signed)
Patient Care Team: [name], MD as PCP - General (Internal Medicine)  SUMMARY OF ONCOLOGIC HISTORY:   Breast cancer of lower-outer quadrant of right female breast ([place])   01/04/1995 Surgery    Right breast cancer status post lumpectomy followed by chemotherapy followed by radiation and tamoxifen 5 years (details are not available)     12/30/2015 Relapse/Recurrence    Chest wall/ soft tissue masses near the right side of the sternum, superior nodule measuring 14.5 mm, inferior nodule measuring 10.5 mm, 26 mm anterior mediastinal soft tissue mass, T9 bone metastases     12/30/2015 Procedure    Thoracentesis: 400 mL removed; showed reactive mesothelial cells, no cancer cells     12/30/2015 Imaging    CT chest: 26 x 17 mm anterior mediastinal soft tissue mass, mixed lytic and sclerotic bone mets mainly involving sternum and T9 vertebral body, scattered vertebral body involvement and rib involvement; chest wall soft tissue masses 14.5 mm, 10.5 mm     01/13/2016 Initial Biopsy    Right chest wall mass biopsy 2:30 position: Invasive ductal carcinoma focally involves skeletal muscle, grade 2, HER-2 negative ratio 1.08, ER/PR 95% positive     01/14/2016 PET scan    Widespread metastatic disease right breast, right chest wall, mediastinum, right internal mammary lymph nodes, right pleural space, malignant pleural effusion, right common iliac lymph node, bone metastases in the thorax     01/21/2016 -  Anti-estrogen oral therapy    Ibrance with letrozole      CHIEF COMPLIANT: Follow-up on Ibrance with letrozole  INTERVAL HISTORY: Jessica Zavala is a 70 year old with above-mentioned history metastatic breast cancer currently on Ibrance with letrozole since February 2017. Because of neutropenia and the dose had to be reduced to 75 mg daily. In spite of this her [place] today is not adequate to resume her treatment. She reports to be feeling perfectly fine. Denies any fevers or chills. Denies any muscle  aches or weakness. She has excellent appetite is full of energy.  REVIEW OF SYSTEMS:   Constitutional: Denies fevers, chills or abnormal weight loss Eyes: Denies blurriness of vision Ears, nose, mouth, throat, and face: Denies mucositis or sore throat Respiratory: Denies cough, dyspnea or wheezes Cardiovascular: Denies palpitation, chest discomfort Gastrointestinal:  Denies nausea, heartburn or change in bowel habits Skin: Denies abnormal skin rashes Lymphatics: Denies new lymphadenopathy or easy bruising Neurological:Denies numbness, tingling or new weaknesses Behavioral/Psych: Mood is stable, no new changes  Extremities: No lower extremity edema Breast:  denies any pain or lumps or nodules in either breasts All other systems were reviewed with the patient and are negative.  I have reviewed the past medical history, past surgical history, social history and family history with the patient and they are unchanged from previous note.  ALLERGIES:  is allergic to aspirin.  MEDICATIONS:  Current Outpatient Prescriptions  Medication Sig Dispense Refill  . acetaminophen (TYLENOL) 325 MG tablet Take 650 mg by mouth every 6 (six) hours as needed for mild pain.    . cromolyn (OPTICROM) 4 % ophthalmic solution Place 1 drop into the right eye 4 (four) times daily. Reported on 06/24/2016    . fluorometholone (FML) 0.1 % ophthalmic suspension Reported on 06/24/2016    . labetalol (NORMODYNE) 200 MG tablet Take 200 mg by mouth 2 (two) times daily.    Marland Kitchen letrozole (FEMARA) 2.5 MG tablet Take 1 tablet (2.5 mg total) by mouth daily. 90 tablet 3  . losartan (COZAAR) 100 MG tablet     .  magic mouthwash w/lidocaine SOLN Take 5 mLs by mouth 4 (four) times daily as needed for mouth pain. 100 mL 0  . Menthol, Topical Analgesic, (BIOFREEZE COLORLESS) 4 % GEL Use in affected area TID prn (Patient not taking: Reported on 02/02/2016) 100 mL 0  . olmesartan (BENICAR) 40 MG tablet Take 40 mg by mouth daily. Reported  on 06/24/2016    . Omega-3 Fatty Acids (FISH OIL PO) Take 1 capsule by mouth daily.    . palbociclib (IBRANCE) 75 MG capsule Take 1 capsule (75 mg total) by mouth daily with breakfast. 21 capsule 3  . phenazopyridine (PYRIDIUM) 200 MG tablet Take 1 tablet (200 mg total) by mouth 3 (three) times daily. (Patient not taking: Reported on 02/02/2016) 6 tablet 0  . simvastatin (ZOCOR) 40 MG tablet Take 40 mg by mouth daily. Reported on 06/24/2016    . triamcinolone (KENALOG) 0.1 % paste Use as directed 1 application in the mouth or throat 2 (two) times daily. 5 g 0   No current facility-administered medications for this visit.     PHYSICAL EXAMINATION: ECOG PERFORMANCE STATUS: 1 - Symptomatic but completely ambulatory  Vitals:   07/08/16 0844  BP: 140/76  Pulse: 74  Resp: 18  Temp: 98.4 F (36.9 C)   Filed Weights   07/08/16 0844  Weight: 120 lb 11.2 oz (54.7 kg)    GENERAL:alert, no distress and comfortable SKIN: skin color, texture, turgor are normal, no rashes or significant lesions EYES: normal, Conjunctiva are pink and non-injected, sclera clear OROPHARYNX:no exudate, no erythema and lips, buccal mucosa, and tongue normal  NECK: supple, thyroid normal size, non-tender, without nodularity LYMPH:  no palpable lymphadenopathy in the cervical, axillary or inguinal LUNGS: clear to auscultation and percussion with normal breathing effort HEART: regular rate & rhythm and no murmurs and no lower extremity edema ABDOMEN:abdomen soft, non-tender and normal bowel sounds MUSCULOSKELETAL:no cyanosis of digits and no clubbing  NEURO: alert & oriented x 3 with fluent speech, no focal motor/sensory deficits EXTREMITIES: No lower extremity edema   LABORATORY DATA:  I have reviewed the data as listed   Chemistry      Component Value Date/Time   NA 138 07/08/2016 0830   K 4.8 07/08/2016 0830   CL 102 12/20/2015 0509   CO2 28 07/08/2016 0830   BUN 15.9 07/08/2016 0830   CREATININE 0.8  07/08/2016 0830      Component Value Date/Time   CALCIUM 9.6 07/08/2016 0830   ALKPHOS 41 07/08/2016 0830   AST 18 07/08/2016 0830   ALT 11 07/08/2016 0830   BILITOT 0.49 07/08/2016 0830       Lab Results  Component Value Date   WBC 2.1 (L) 07/08/2016   HGB 11.6 07/08/2016   HCT 34.6 (L) 07/08/2016   MCV 103.2 (H) 07/08/2016   PLT 195 07/08/2016   NEUTROABS 0.6 (L) 07/08/2016     ASSESSMENT & PLAN:  Breast cancer of lower-outer quadrant of right female breast ([place]) Metastatic breast cancer PET-CT 01/14/16: Widespread metastatic disease right breast, right chest wall several nodules largest 3.7 cm, mediastinum, right internal mammary lymph node 2.2 cm, right pleural-based nodules 2.1cm and 2.7 cm, malignant pleural effusion, right common iliac lymph node, bone metastases in the thorax (sternum, manubrium, multiple thoracic vertebral bodies, multiple right-sided ribs)  Right chest wall mass biopsy 2:30 position 01/13/2016: Invasive ductal carcinoma focally involves skeletal muscle, grade 2, HER-2 negative ratio 1.08, ER/PR pending (Right breast cancer 1996 status post lumpectomy followed by chemotherapy  followed by radiation and tamoxifen 5 years (details are not available)) ---------------------------------------------------------------------------------------------------------------------------------------------------------- Radiology and pathology review: I discussed with the patient the details of the pathology report as well as the PET/CT scan negative reviewed the films with the patient.  Treatment plan: Ibrance with letrozole started 01/20/2016 , cycle 3 Ibrance 100 mg, Cycle 4 Ibrance 75 mg dose Ibrance toxicities: 1. Neutropenia: Today's ANC is 0.6  Currently on 75 mg dose. I instructed her that we cannot restart Ibrance until [facility] is greater than 1. Our plans to recheck her CBC in 2 weeks. My plan is to then switch her to 75 mg 3 weeks on followed by 2 weeks off Denies  any hot flashes or myalgias related to letrozole.  Bone metastases: Xgeva q 4 weeks. Patient currently takes calcium with vitamin D Hypertension: Instructed the patient to follow with her primary care physician CT chest abdomen pelvis 04/22/2016: Improvement in lymph nodes as well as bone metastases. No further pleural effusion. Excellent response to treatment.  RTC in 2 weeks for blood work checked.   Orders Placed This Encounter  Procedures  . CBC with Differential    Standing Status:   Future    Standing Expiration Date:   07/08/2017  . Comprehensive metabolic panel    Standing Status:   Future    Standing Expiration Date:   07/08/2017   The patient has a good understanding of the overall plan. she agrees with it. she will call with any problems that may develop before the next visit here.   Rulon Eisenmenger, MD 07/08/16

## 2016-07-08 NOTE — Assessment & Plan Note (Signed)
Metastatic breast cancer PET-CT 01/14/16: Widespread metastatic disease right breast, right chest wall several nodules largest 3.7 cm, mediastinum, right internal mammary lymph node 2.2 cm, right pleural-based nodules 2.1cm and 2.7 cm, malignant pleural effusion, right common iliac lymph node, bone metastases in the thorax (sternum, manubrium, multiple thoracic vertebral bodies, multiple right-sided ribs)  Right chest wall mass biopsy 2:30 position 01/13/2016: Invasive ductal carcinoma focally involves skeletal muscle, grade 2, HER-2 negative ratio 1.08, ER/PR pending (Right breast cancer 1996 status post lumpectomy followed by chemotherapy followed by radiation and tamoxifen 5 years (details are not available)) ---------------------------------------------------------------------------------------------------------------------------------------------------------- Radiology and pathology review: I discussed with the patient the details of the pathology report as well as the PET/CT scan negative reviewed the films with the patient.  Treatment plan: Ibrance with letrozole started 01/20/2016 , cycle 3 Ibrance 100 mg, Cycle 4 Ibrance 75 mg dose Ibrance toxicities: 1. Neutropenia: Today's ANC is .  Currently on 75 mg dose. Denies any hot flashes or myalgias related to letrozole.  Bone metastases: Xgeva q 4 weeks. Patient currently takes calcium with vitamin D Hypertension: Instructed the patient to follow with her primary care physician CT chest abdomen pelvis 04/22/2016: Improvement in lymph nodes as well as bone metastases. No further pleural effusion. Excellent response to treatment.  RTC in 8 weeks for lab check and every 4 weeks for [facility]

## 2016-07-08 NOTE — Telephone Encounter (Signed)
per pof to sch pt appt-gave pt copy of avs °

## 2016-07-08 NOTE — Patient Instructions (Signed)
Denosumab injection ?y l thu?c g? DENOSUMAB lm ch?m s? suy y?u x??ng. Prolia ???c dng ?? ?i?u tr? ch?ng long x??ng ? ph? n? sau khi mn kinh v ? nam gi?i. Xgeva ???c dng ?? phng ng?a gy x??ng v cc v?n ?? khc v? x??ng gy ra b?i di c?n x??ng do ung th?. Xgeva c?ng ???c dng ?? ?i?u tr? b??u t? bo kh?ng l? (giant cell tumor) c?a x??ng. Thu?c ny c th? ???c dng cho nh?ng m?c ?ch khc; hy h?i ng??i cung c?p d?ch v? y t? ho?c d??c s? c?a mnh, n?u qu v? c th?c m?c. Ti c?n ph?i bo cho ng??i cung c?p d?ch v? y t? c?a mnh ?i?u g tr??c khi dng thu?c ny? H? c?n bi?t li?u qu v? c b?t k? tnh tr?ng no sau ?y khng: -b?nh v? r?ng -chm da -nhi?m trng ho?c ti?n s? nhi?m trng -b?nh th?n ho?c n?u qu v? ?ang ???c th?m phn -m?c calci ho?c vitamin D trong mu th?p -h?i ch?ng r?i lo?n h?p th? -???c x?p vo l?ch ?? gi?i ph?u ho?c nh? r?ng -?ang du?ng thu?c co? ch??a denosumab -b?nh tuy?n gip tr?ng ho?c tuy?n c?n gip tr?ng -ph?n ?ng b?t th??ng v?i denosumab -pha?n ??ng b?t th???ng ho??c di? ??ng v??i ca?c d??c ph?m kha?c -pha?n ??ng b?t th???ng ho??c di? ??ng v??i th??c ph?m, thu?c nhu?m, ho??c ch?t ba?o qua?n -?ang c thai ho??c ??nh co? thai -?ang cho con bu? Ti nn s? d?ng thu?c ny nh? th? no? Thu?c ny ?? tim d??i da. Thu?c ny ???c s? d?ng b?i chuyn vin y t? ? b?nh vi?n ho?c ? phng m?ch. N?u qu v? s?p dng thu?c Prolia, d??c s? s? ??a cho qu v? m?t B?n H??ng D?n v? D??c Ph?m (MedGuide) ??c bi?t cho m?i toa thu?c v cho m?i l?n mua thm thu?c ?. Hy b?o ??m ??c k? thng tin ny m?i l?n. ??i v?i Prolia, hy bn v?i bc s? nhi khoa c?a qu v? v? vi?c dng thu?c ny ? tr? em. C th? c?n ch?m sc ??c bi?t. ??i v?i Xgeva, hy bn v?i bc s? nhi khoa c?a qu v? v? vi?c dng thu?c ny ? tr? em. Thu?c ny c th? ???c k toa cho tr? em ch? m?i 13 tu?i trong nh?ng tr??ng h?p ch?n l?c, nh?ng c?n ph?i th?n tr?ng. Qu li?u: N?u qu v? cho r?ng mnh ? dng qu nhi?u  thu?c ny, th hy lin l?c v?i trung tm ki?m sot ch?t ??c ho?c phng c?p c?u ngay l?p t?c. L?U : Thu?c ny ch? dnh ring cho qu v?. Khng chia s? thu?c ny v?i nh?ng ng??i khc. N?u ti l? qun m?t li?u th sao? ?i?u quan tr?ng l khng nn b? l? li?u thu?c no. Hy lin l?c v?i bc s? ho?c chuyn vin y t? c?a mnh, n?u qu v? khng th? gi? ?ng cu?c h?n khm. Nh?ng g c th? t??ng tc v?i thu?c ny? Khng ???c dng thu?c ny cng v?i b?t k? th? no sau ?y: -nh??ng thu?c kha?c co? ch??a denosumab Thu?c ny c?ng c th? t??ng tc v?i cc thu?c sau ?y: -cc thu?c ?c ch? h? mi?n d?ch -cc thu?c ?i?u tr? b?nh ung th? -cc thu?c steroid, ch?ng h?n nh? prednisone ho?c cortisone Danh sch ny c th? khng m t? ?? h?t cc t??ng tc c th? x?y ra. Hy ??a cho ng??i cung c?p d?ch v? y t? c?a mnh danh sch t?t c? cc thu?c, th?o d??c, cc thu?c khng c?n toa, ho?c cc   ch? ph?m b? sung m qu v? dng. C?ng nn bo cho h? bi?t r?ng qu v? c ht thu?c, u?ng r??u, ho?c c s? d?ng ma ty tri php hay khng. Vi th? c th? t??ng tc v?i thu?c c?a qu v?. Ti c?n ph?i theo di ?i?u g trong khi dng thu?c ny? Hy ?i g?p bc s? ho?c chuyn vin y t? ?? theo di ??nh k? s? c?i thi?n c?a qu v?. Bc s? c th? yu c?u th? mu v lm cc xt nghi?m khc ?? theo di s? c?i thi?n c?a qu v?. Hy lin l?c v?i bc s? ho?c chuyn vin y t?, n?u qu v? b? c?m l?nh ho?c b? b?nh nhi?m trng khc trong khi dng thu?c ny. Khng ???c t? ?i?u tr? cho mnh. Thu?c ny c th? lm gi?m kh? n?ng ch?ng l?i cc b?nh nhi?m trng c?a c? th?. Qu v? c?n ph?i ch?c ch?n r?ng c? th? qu v? nh?n ???c ??y ?? l??ng calci v vitamin D trong th?i gian dng thu?c ny, tr? khi bc s? yu c?u qu v? ??ng lm nh? v?y. Hy th?o lu?n v?i bc s? ho?c chuyn vin y t? v? nh?ng th?c ph?m qu v? ?n v nh?ng vitamin qu v? dng. Hy ?i nha s? th??ng xuyn. ?nh r?ng ho?c x?a r?ng b?ng ch? nha khoa nh? ? ???c ch? d?n. N?u qu v? c ?i lm r?ng, th  hy bo v?i nha s? r?ng qu v? ?ang dng thu?c ny. Khng ???c c thai trong th?i gian dng thu?c ny ho?c trong vng 5 thng sau khi ng?ng dng thu?c. Ph? n? c?n ph?i thng bo cho bc s? c?a mnh, n?u mu?n c thai ho?c ngh? r?ng c th? mnh ? c thai. C nguy c? v? cc tc d?ng ph? nghim tr?ng ??i v?i thai nhi. Hy th?o lu?n v?i bc s? ho?c chuyn vin y t? ho?c d??c s? ?? bi?t thm thng tin. Ti c th? nh?n th?y nh?ng tc d?ng ph? no khi dng thu?c ny? Nh?ng tc d?ng ph? qu v? c?n ph?i bo cho bc s? ho?c chuyn vin y t? cng s?m cng t?t: -cc ph?n ?ng d? ?ng, ch?ng h?n nh? da b? m?n ??, ng?a, n?i my ?ay, s?ng ? m?t, mi, ho?c l??i -cc v?n ?? v? h h?p -?au ng?c -tim ??p nhanh ho?c khng ??u -c?m th?y chong vng, ng?t x?u, b? t -s?t, ?n l?nh, ho?c b?t k? d?u hi?u nhi?m trng no khc -co th?t, c?ng, ho?c gi?t c? b?p -t ho?c c?m gic nh? b? ki?n b -da b? r?p ho?c c u c?c, ho?c da b? kh, bong trc ho?c ?? -v?t th??ng lu lnh ho?c ?au khng r nguyn nhn ? mi?ng ho?c hm -b? b?m tm ho?c xu?t huy?t b?t th??ng Cc tc d?ng ph? khng c?n ph?i ch?m sc y t? (Hy bo cho bc s? ho?c chuyn vin y t? n?u cc tc d?ng ph? ny ti?p di?n ho?c gy phi?n toi): -?au c? b?p -kh ch?u bao t?, ??y h?i Danh sch ny c th? khng m t? ?? h?t cc tc d?ng ph? c th? x?y ra. Xin g?i t?i bc s? c?a mnh ?? ???c c? v?n chuyn mn v? cc tc d?ng ph?. Qu v? c th? t??ng trnh cc tc d?ng ph? cho FDA theo s? [phone]. Ti nn c?t gi? thu?c c?a mnh ? ?u? Thu?c ny ch? ???c dng trong phng m?ch, v?n phng bc s? ho?c c? s? y t? khc v s? khng ???c c?t gi?   t?i nh. L?U : ?y l b?n tm t?t. N c th? khng bao hm t?t c? thng tin c th? c. N?u qu v? th?c m?c v? thu?c ny, xin trao ??i v?i bc s?, d??c s?, ho?c ng??i cung c?p d?ch v? y t? c?a mnh.    2016, Elsevier/Gold Standard. (2012-07-05 00:00:00)  

## 2016-07-15 ENCOUNTER — Other Ambulatory Visit: Payer: Commercial Managed Care - HMO

## 2016-07-15 ENCOUNTER — Ambulatory Visit: Payer: Commercial Managed Care - HMO | Admitting: Hematology and Oncology

## 2016-07-22 ENCOUNTER — Encounter: Payer: Self-pay | Admitting: Hematology and Oncology

## 2016-07-22 ENCOUNTER — Telehealth: Payer: Self-pay | Admitting: Hematology and Oncology

## 2016-07-22 ENCOUNTER — Ambulatory Visit (HOSPITAL_BASED_OUTPATIENT_CLINIC_OR_DEPARTMENT_OTHER): Payer: Commercial Managed Care - HMO | Admitting: Hematology and Oncology

## 2016-07-22 ENCOUNTER — Ambulatory Visit: Payer: Commercial Managed Care - HMO

## 2016-07-22 ENCOUNTER — Other Ambulatory Visit (HOSPITAL_BASED_OUTPATIENT_CLINIC_OR_DEPARTMENT_OTHER): Payer: Commercial Managed Care - HMO

## 2016-07-22 DIAGNOSIS — C7951 Secondary malignant neoplasm of bone: Secondary | ICD-10-CM

## 2016-07-22 DIAGNOSIS — C778 Secondary and unspecified malignant neoplasm of lymph nodes of multiple regions: Secondary | ICD-10-CM | POA: Diagnosis not present

## 2016-07-22 DIAGNOSIS — C50511 Malignant neoplasm of lower-outer quadrant of right female breast: Secondary | ICD-10-CM | POA: Diagnosis not present

## 2016-07-22 LAB — CBC WITH DIFFERENTIAL/PLATELET
BASO%: 2.5 % — AB (ref 0.0–2.0)
Basophils Absolute: 0.1 10*3/uL (ref 0.0–0.1)
EOS ABS: 0.4 10*3/uL (ref 0.0–0.5)
EOS%: 8.9 % — ABNORMAL HIGH (ref 0.0–7.0)
HEMATOCRIT: 36 % (ref 34.8–46.6)
HGB: 12.2 g/dL (ref 11.6–15.9)
LYMPH#: 1.5 10*3/uL (ref 0.9–3.3)
LYMPH%: 38.4 % (ref 14.0–49.7)
MCH: 34.7 pg — ABNORMAL HIGH (ref 25.1–34.0)
MCHC: 33.9 g/dL (ref 31.5–36.0)
MCV: 102.4 fL — AB (ref 79.5–101.0)
MONO#: 0.4 10*3/uL (ref 0.1–0.9)
MONO%: 10.6 % (ref 0.0–14.0)
NEUT%: 39.6 % (ref 38.4–76.8)
NEUTROS ABS: 1.6 10*3/uL (ref 1.5–6.5)
PLATELETS: 299 10*3/uL (ref 145–400)
RBC: 3.51 10*6/uL — AB (ref 3.70–5.45)
RDW: 15.6 % — ABNORMAL HIGH (ref 11.2–14.5)
WBC: 4 10*3/uL (ref 3.9–10.3)

## 2016-07-22 LAB — COMPREHENSIVE METABOLIC PANEL
ALT: 19 U/L (ref 0–55)
ANION GAP: 6 meq/L (ref 3–11)
AST: 26 U/L (ref 5–34)
Albumin: 3.8 g/dL (ref 3.5–5.0)
Alkaline Phosphatase: 41 U/L (ref 40–150)
BILIRUBIN TOTAL: 0.62 mg/dL (ref 0.20–1.20)
BUN: 14.9 mg/dL (ref 7.0–26.0)
CALCIUM: 9.7 mg/dL (ref 8.4–10.4)
CO2: 32 mEq/L — ABNORMAL HIGH (ref 22–29)
CREATININE: 0.8 mg/dL (ref 0.6–1.1)
Chloride: 99 mEq/L (ref 98–109)
EGFR: 74 mL/min/{1.73_m2} — AB (ref >90)
Glucose: 94 mg/dl (ref 70–140)
Potassium: 4.8 mEq/L (ref 3.5–5.1)
Sodium: 138 mEq/L (ref 136–145)
TOTAL PROTEIN: 7.7 g/dL (ref 6.4–8.3)

## 2016-07-22 MED ORDER — PALBOCICLIB 75 MG PO CAPS: 75.0000 mg | ORAL_CAPSULE | Freq: Every day | ORAL | 3 refills | Status: DC

## 2016-07-22 NOTE — Assessment & Plan Note (Signed)
Metastatic breast cancer PET-CT 01/14/16: Widespread metastatic disease right breast, right chest wall several nodules largest 3.7 cm, mediastinum, right internal mammary lymph node 2.2 cm, right pleural-based nodules 2.1cm and 2.7 cm, malignant pleural effusion, right common iliac lymph node, bone metastases in the thorax (sternum, manubrium, multiple thoracic vertebral bodies, multiple right-sided ribs)  Right chest wall mass biopsy 2:30 position 01/13/2016: Invasive ductal carcinoma focally involves skeletal muscle, grade 2, HER-2 negative ratio 1.08, ER/PR pending (Right breast cancer 1996 status post lumpectomy followed by chemotherapy followed by radiation and tamoxifen 5 years (details are not available)) ---------------------------------------------------------------------------------------------------------------------------------------------------------- Treatment plan: Ibrance with letrozole started 01/20/2016 , cycle 3 Ibrance 100 mg, Cycle 4 Ibrance 75 mg dose Ibrance toxicities: 1. Neutropenia: Today's ANC is 0.6  Currently on 75 mg dose. I instructed her that we cannot restart Ibrance until [facility] is greater than 1. Our plans to recheck her CBC in 2 weeks. My plan is to then switch her to 75 mg 3 weeks on followed by 2 weeks off Denies any hot flashes or myalgias related to letrozole.  Bone metastases: We will change Xgeva to q 5 weeks. Patient currently takes calcium with vitamin D Hypertension: Instructed the patient to follow with her primary care physician CT chest abdomen pelvis 04/22/2016: Improvement in lymph nodes as well as bone metastases. No further pleural effusion. Excellent response to treatment.  RTC in 5 weeks for blood work check and follow up.

## 2016-07-22 NOTE — Progress Notes (Signed)
Patient Care Team: [name], MD as PCP - General (Internal Medicine)  DIAGNOSIS: Metastatic breast cancer SUMMARY OF ONCOLOGIC HISTORY:   Breast cancer of lower-outer quadrant of right female breast ([place])   01/04/1995 Surgery    Right breast cancer status post lumpectomy followed by chemotherapy followed by radiation and tamoxifen 5 years (details are not available)      12/30/2015 Relapse/Recurrence    Chest wall/ soft tissue masses near the right side of the sternum, superior nodule measuring 14.5 mm, inferior nodule measuring 10.5 mm, 26 mm anterior mediastinal soft tissue mass, T9 bone metastases      12/30/2015 Procedure    Thoracentesis: 400 mL removed; showed reactive mesothelial cells, no cancer cells      12/30/2015 Imaging    CT chest: 26 x 17 mm anterior mediastinal soft tissue mass, mixed lytic and sclerotic bone mets mainly involving sternum and T9 vertebral body, scattered vertebral body involvement and rib involvement; chest wall soft tissue masses 14.5 mm, 10.5 mm      01/13/2016 Initial Biopsy    Right chest wall mass biopsy 2:30 position: Invasive ductal carcinoma focally involves skeletal muscle, grade 2, HER-2 negative ratio 1.08, ER/PR 95% positive      01/14/2016 PET scan    Widespread metastatic disease right breast, right chest wall, mediastinum, right internal mammary lymph nodes, right pleural space, malignant pleural effusion, right common iliac lymph node, bone metastases in the thorax      01/21/2016 -  Anti-estrogen oral therapy    Ibrance with letrozole       CHIEF COMPLIANT: Follow-up on Ibrance with letrozole treatment was held for neutropenia  INTERVAL HISTORY: Jessica Zavala is a 70 year old with above-mentioned history of metastatic breast cancer currently on Ibrance with letrozole. We had to hold her treatment for neutropenia and we gave her 2 week of treatment. She is here for recheck of her blood counts. She reports no pain or discomfort in  the bones. Denies any shortness of breath or exertion. She is tolerating the treatment fairly well. She has had mild cramps in the left leg. I used a [place] interpreter for the clinical visit.  REVIEW OF SYSTEMS:   Constitutional: Denies fevers, chills or abnormal weight loss Eyes: Denies blurriness of vision Ears, nose, mouth, throat, and face: Denies mucositis or sore throat Respiratory: Denies cough, dyspnea or wheezes Cardiovascular: Denies palpitation, chest discomfort Gastrointestinal:  Denies nausea, heartburn or change in bowel habits Skin: Denies abnormal skin rashes Lymphatics: Denies new lymphadenopathy or easy bruising Neurological:Denies numbness, tingling or new weaknesses Behavioral/Psych: Mood is stable, no new changes  Extremities: No lower extremity edema  All other systems were reviewed with the patient and are negative.  I have reviewed the past medical history, past surgical history, social history and family history with the patient and they are unchanged from previous note.  ALLERGIES:  is allergic to aspirin.  MEDICATIONS:  Current Outpatient Prescriptions  Medication Sig Dispense Refill  . acetaminophen (TYLENOL) 325 MG tablet Take 650 mg by mouth every 6 (six) hours as needed for mild pain.    . cromolyn (OPTICROM) 4 % ophthalmic solution Place 1 drop into the right eye 4 (four) times daily. Reported on 06/24/2016    . fluorometholone (FML) 0.1 % ophthalmic suspension Reported on 06/24/2016    . labetalol (NORMODYNE) 200 MG tablet Take 200 mg by mouth 2 (two) times daily.    Marland Kitchen letrozole (FEMARA) 2.5 MG tablet Take 1 tablet (2.5 mg  total) by mouth daily. 90 tablet 3  . losartan (COZAAR) 100 MG tablet     . magic mouthwash w/lidocaine SOLN Take 5 mLs by mouth 4 (four) times daily as needed for mouth pain. 100 mL 0  . Menthol, Topical Analgesic, (BIOFREEZE COLORLESS) 4 % GEL Use in affected area TID prn (Patient not taking: Reported on 02/02/2016) 100 mL 0  .  olmesartan (BENICAR) 40 MG tablet Take 40 mg by mouth daily. Reported on 06/24/2016    . Omega-3 Fatty Acids (FISH OIL PO) Take 1 capsule by mouth daily.    . palbociclib (IBRANCE) 75 MG capsule Take 1 capsule (75 mg total) by mouth daily with breakfast. 3 weeks on 2 weeks off 21 capsule 3  . phenazopyridine (PYRIDIUM) 200 MG tablet Take 1 tablet (200 mg total) by mouth 3 (three) times daily. (Patient not taking: Reported on 02/02/2016) 6 tablet 0  . simvastatin (ZOCOR) 40 MG tablet Take 40 mg by mouth daily. Reported on 06/24/2016    . triamcinolone (KENALOG) 0.1 % paste Use as directed 1 application in the mouth or throat 2 (two) times daily. 5 g 0   No current facility-administered medications for this visit.     PHYSICAL EXAMINATION: ECOG PERFORMANCE STATUS: 1 - Symptomatic but completely ambulatory  Vitals:   07/22/16 0828  BP: 134/70  Pulse: 85  Resp: 18  Temp: 98.4 F (36.9 C)   Filed Weights   07/22/16 0828  Weight: 119 lb 11.2 oz (54.3 kg)    GENERAL:alert, no distress and comfortable SKIN: skin color, texture, turgor are normal, no rashes or significant lesions EYES: normal, Conjunctiva are pink and non-injected, sclera clear OROPHARYNX:no exudate, no erythema and lips, buccal mucosa, and tongue normal  NECK: supple, thyroid normal size, non-tender, without nodularity LYMPH:  no palpable lymphadenopathy in the cervical, axillary or inguinal LUNGS: clear to auscultation and percussion with normal breathing effort HEART: regular rate & rhythm and no murmurs and no lower extremity edema ABDOMEN:abdomen soft, non-tender and normal bowel sounds MUSCULOSKELETAL:no cyanosis of digits and no clubbing  NEURO: alert & oriented x 3 with fluent speech, no focal motor/sensory deficits EXTREMITIES: No lower extremity edema   LABORATORY DATA:  I have reviewed the data as listed   Chemistry      Component Value Date/Time   NA 138 07/22/2016 0810   K 4.8 07/22/2016 0810   CL 102  12/20/2015 0509   CO2 32 (H) 07/22/2016 0810   BUN 14.9 07/22/2016 0810   CREATININE 0.8 07/22/2016 0810      Component Value Date/Time   CALCIUM 9.7 07/22/2016 0810   ALKPHOS 41 07/22/2016 0810   AST 26 07/22/2016 0810   ALT 19 07/22/2016 0810   BILITOT 0.62 07/22/2016 0810       Lab Results  Component Value Date   WBC 4.0 07/22/2016   HGB 12.2 07/22/2016   HCT 36.0 07/22/2016   MCV 102.4 (H) 07/22/2016   PLT 299 07/22/2016   NEUTROABS 1.6 07/22/2016     ASSESSMENT & PLAN:  Breast cancer of lower-outer quadrant of right female breast ([place]) Metastatic breast cancer PET-CT 01/14/16: Widespread metastatic disease right breast, right chest wall several nodules largest 3.7 cm, mediastinum, right internal mammary lymph node 2.2 cm, right pleural-based nodules 2.1cm and 2.7 cm, malignant pleural effusion, right common iliac lymph node, bone metastases in the thorax (sternum, manubrium, multiple thoracic vertebral bodies, multiple right-sided ribs)  Right chest wall mass biopsy 2:30 position 01/13/2016: Invasive ductal  carcinoma focally involves skeletal muscle, grade 2, HER-2 negative ratio 1.08, ER/PR pending (Right breast cancer 1996 status post lumpectomy followed by chemotherapy followed by radiation and tamoxifen 5 years (details are not available)) ---------------------------------------------------------------------------------------------------------------------------------------------------------- Treatment plan: Ibrance with letrozole started 01/20/2016 , cycle 3 Ibrance 100 mg, Cycle 4 Ibrance 75 mg dose; switch to 75 mg 3 weeks on 2 weeks of from 07/22/2016 Ibrance toxicities: 1. Neutropenia: Today's ANC is 1.6  Currently on 75 mg dose. Plan is to then switch her to 75 mg 3 weeks on followed by 2 weeks off Denies any hot flashes or myalgias related to letrozole.  Bone metastases: We will change Xgeva to q 5 weeks. Patient currently takes calcium with vitamin  D Hypertension: Instructed the patient to follow with her primary care physician CT chest abdomen pelvis 04/22/2016: Improvement in lymph nodes as well as bone metastases. No further pleural effusion. Excellent response to treatment.  RTC in 5 weeks for blood work check and follow up. Our next scan will be performed in November 2017.   No orders of the defined types were placed in this encounter.  The patient has a good understanding of the overall plan. she agrees with it. she will call with any problems that may develop before the next visit here.   Rulon Eisenmenger, MD 07/22/16

## 2016-07-22 NOTE — Telephone Encounter (Signed)
appt made and avs printed °

## 2016-07-25 ENCOUNTER — Telehealth: Payer: Self-pay | Admitting: Pharmacist

## 2016-07-25 NOTE — Telephone Encounter (Signed)
Oral Chemotherapy Pharmacist Encounter   Received call from [facility] that patient was not able to pick up Ibrance refill today due to high co-pay. Pt had been enrolled in foundation assistance, but patient has already depleted available funds. They will not allow patient to reaply until Feb 2018 and there are no other foundation monies available for breast cancer at this time.  I will start the [place] patient assistance program application.   I LVM for son [name] with this information, and requested financial documentation for his mom as well as requested his mom come to [facility] at some point to sign application.  Oral Chemo Clinic will continue to follow.  Johny Drilling, PharmD, BCPS Oral Chemotherapy Clinic

## 2016-07-27 ENCOUNTER — Telehealth: Payer: Self-pay | Admitting: Pharmacist

## 2016-07-27 NOTE — Telephone Encounter (Signed)
Oral Chemotherapy Pharmacist Encounter   Received call from patient's son [name] about requested financial documentation for [place] patient assistance application. His mother and father will come by [facility] at 10am to bring financial documentation and sign the application.  Oral Chemo Clinic will continue to follow.  Johny Drilling, PharmD, BCPS Oral Chemotherapy Clinic

## 2016-07-29 ENCOUNTER — Encounter: Payer: Self-pay | Admitting: Pharmacist

## 2016-07-29 NOTE — Progress Notes (Signed)
Oral Chemotherapy Pharmacist Encounter   I met patient and husband in lobby this morning. They had brought in financial documentation to include with [place] patient assistance application for [employer]. An interpretor was used as patient and husband speak limited [place].  Pt has been off Ibrance for 2 weeks since not being able to afford co-pay due to depletion of funds through the patient advocate foundation.  I was able to provide patient with a month supply of Ibrance 34m that patient will begin taking today. Patient [place] foundation [phone]  Oral Chemo Clinic will continue to follow and reach out to patient's son RPaulo Fruitwith any additional information.  JJohny Drilling PharmD, BCPS Oral Chemotherapy Clinic

## 2016-08-02 ENCOUNTER — Encounter: Payer: Self-pay | Admitting: Pharmacist

## 2016-08-02 NOTE — Progress Notes (Signed)
Oral Chemotherapy Pharmacist Encounter   Received notification for Jessica Zavala Patient Assistance that funds for breast cancer are available through [place] and the [employer] [place] (PAF). Until these funds are exhausted, [employer] will not enroll patient into their program.  Patient has already exhausted funds through [employer].   I was able to enroll patient with [employer] for breast cancer and she has been approved. Approval term dates: 08/02/16-08/02/17 Award amount: $[id number] Patient ID: [id number]  Once funds are exhausted, we can call [employer] at [phone] to request additional funds if they are still available.  I called [facility] to alert them of these funds. Medication has been filled and will be ready for pick-up tomorrow 8/30.  I called patient's son Jessica Zavala, LVM with the above information and to look out for call from [facility] when Jessica Zavala is ready for pick up.  I called [employer] to alert them of funds through [facility] and that patient has exhausted PAF. They will keep her application on file for 90 days. If patient exhausts funds prior to that time, we can call [place] back to reactivate application. If greater than 90 days has passed, a new application will have to be submitted.  Johny Drilling, PharmD, BCPS Oral Chemotherapy Clinic

## 2016-08-05 ENCOUNTER — Ambulatory Visit: Payer: Commercial Managed Care - HMO

## 2016-08-09 ENCOUNTER — Telehealth: Payer: Self-pay

## 2016-08-09 NOTE — Telephone Encounter (Signed)
Ibrance Status  Spoke with pharmacy staff at [facility] and they informed me patient has been called to pick up the RX but has yet to pick up.  Will again F/U on status this week.  Thank You  Henreitta Leber, PharmD Oral Oncology Navigation Clinic

## 2016-08-12 ENCOUNTER — Telehealth: Payer: Self-pay

## 2016-08-12 NOTE — Telephone Encounter (Signed)
Upon chart review following up on oral chemo status/labs/upcoming appts, pt noted to have not picked up Ibrance from pharmacy after financial assistance established and has thus been off medication for close to 1 month now.  Called to touch base with pt's son and unable to reach him.  Left detailed message requesting call back to follow up on status of his mother's oral chemotherapy management.

## 2016-08-22 MED FILL — *IBRANCE 75 MG CAPSULE: 75 | 30 days supply | Qty: 21 | Fill #0

## 2016-08-25 NOTE — Assessment & Plan Note (Signed)
Metastatic breast cancer PET-CT 01/14/16: Widespread metastatic disease right breast, right chest wall several nodules largest 3.7 cm, mediastinum, right internal mammary lymph node 2.2 cm, right pleural-based nodules 2.1cm and 2.7 cm, malignant pleural effusion, right common iliac lymph node, bone metastases in the thorax (sternum, manubrium, multiple thoracic vertebral bodies, multiple right-sided ribs)  Right chest wall mass biopsy 2:30 position 01/13/2016: Invasive ductal carcinoma focally involves skeletal muscle, grade 2, HER-2 negative ratio 1.08, ER/PR pending (Right breast cancer 1996 status post lumpectomy followed by chemotherapy followed by radiation and tamoxifen 5 years (details are not available)) ---------------------------------------------------------------------------------------------------------------------------------------------------------- Treatment plan: Ibrance with letrozole started 01/20/2016 , cycle 3 Ibrance 100 mg, Cycle 4 Ibrance 75 mg dose; switch to 75 mg 3 weeks on 2 weeks of from 07/22/2016 Ibrance toxicities: 1. Neutropenia: Today's ANC is 1.6 Currently on 75 mg dose. Plan is to then switch her to 75 mg 3 weeks on followed by 2 weeks off Denies any hot flashes or myalgias related to letrozole.  Bone metastases: We will change Xgeva to q 5 weeks. Patient currently takes calcium with vitamin D Hypertension: Instructed the patient to follow with her primary care physician CT chest abdomen pelvis 04/22/2016: Improvement in lymph nodes as well as bone metastases. No further pleural effusion. Excellent response to treatment.  RTC in 5 weeks for blood work check and follow up and scans November 2017.

## 2016-08-26 ENCOUNTER — Ambulatory Visit (HOSPITAL_BASED_OUTPATIENT_CLINIC_OR_DEPARTMENT_OTHER): Payer: Commercial Managed Care - HMO | Admitting: Hematology and Oncology

## 2016-08-26 ENCOUNTER — Other Ambulatory Visit (HOSPITAL_BASED_OUTPATIENT_CLINIC_OR_DEPARTMENT_OTHER): Payer: Commercial Managed Care - HMO

## 2016-08-26 ENCOUNTER — Ambulatory Visit (HOSPITAL_BASED_OUTPATIENT_CLINIC_OR_DEPARTMENT_OTHER): Payer: Commercial Managed Care - HMO

## 2016-08-26 DIAGNOSIS — C50511 Malignant neoplasm of lower-outer quadrant of right female breast: Secondary | ICD-10-CM

## 2016-08-26 DIAGNOSIS — Z79811 Long term (current) use of aromatase inhibitors: Secondary | ICD-10-CM | POA: Diagnosis not present

## 2016-08-26 DIAGNOSIS — C7951 Secondary malignant neoplasm of bone: Secondary | ICD-10-CM

## 2016-08-26 DIAGNOSIS — C778 Secondary and unspecified malignant neoplasm of lymph nodes of multiple regions: Secondary | ICD-10-CM

## 2016-08-26 LAB — COMPREHENSIVE METABOLIC PANEL
ALT: 13 U/L (ref 0–55)
AST: 20 U/L (ref 5–34)
Albumin: 3.7 g/dL (ref 3.5–5.0)
Alkaline Phosphatase: 41 U/L (ref 40–150)
Anion Gap: 9 mEq/L (ref 3–11)
BUN: 18.3 mg/dL (ref 7.0–26.0)
CALCIUM: 9.5 mg/dL (ref 8.4–10.4)
CHLORIDE: 101 meq/L (ref 98–109)
CO2: 27 meq/L (ref 22–29)
CREATININE: 0.8 mg/dL (ref 0.6–1.1)
EGFR: 70 mL/min/{1.73_m2} — ABNORMAL LOW (ref >90)
Glucose: 100 mg/dl (ref 70–140)
Potassium: 5.2 mEq/L — ABNORMAL HIGH (ref 3.5–5.1)
Sodium: 137 mEq/L (ref 136–145)
Total Bilirubin: 0.31 mg/dL (ref 0.20–1.20)
Total Protein: 7.7 g/dL (ref 6.4–8.3)

## 2016-08-26 LAB — CBC WITH DIFFERENTIAL/PLATELET
BASO%: 0.7 % (ref 0.0–2.0)
Basophils Absolute: 0 10*3/uL (ref 0.0–0.1)
EOS%: 3.4 % (ref 0.0–7.0)
Eosinophils Absolute: 0.1 10*3/uL (ref 0.0–0.5)
HEMATOCRIT: 34.5 % — AB (ref 34.8–46.6)
HGB: 11.5 g/dL — ABNORMAL LOW (ref 11.6–15.9)
LYMPH#: 1.6 10*3/uL (ref 0.9–3.3)
LYMPH%: 51.5 % — ABNORMAL HIGH (ref 14.0–49.7)
MCH: 34.4 pg — ABNORMAL HIGH (ref 25.1–34.0)
MCHC: 33.4 g/dL (ref 31.5–36.0)
MCV: 102.8 fL — ABNORMAL HIGH (ref 79.5–101.0)
MONO#: 0.4 10*3/uL (ref 0.1–0.9)
MONO%: 12.2 % (ref 0.0–14.0)
NEUT%: 32.2 % — AB (ref 38.4–76.8)
NEUTROS ABS: 1 10*3/uL — AB (ref 1.5–6.5)
PLATELETS: 214 10*3/uL (ref 145–400)
RBC: 3.36 10*6/uL — ABNORMAL LOW (ref 3.70–5.45)
RDW: 16.1 % — ABNORMAL HIGH (ref 11.2–14.5)
WBC: 3.1 10*3/uL — ABNORMAL LOW (ref 3.9–10.3)

## 2016-08-26 MED ORDER — DENOSUMAB 120 MG/1.7ML ~~LOC~~ SOLN
120.0000 mg | Freq: Once | SUBCUTANEOUS | Status: AC
  Administered 2016-08-26: 120 mg via SUBCUTANEOUS
  Filled 2016-08-26: qty 1.7

## 2016-08-26 NOTE — Patient Instructions (Signed)
Denosumab injection ?y l thu?c g? DENOSUMAB lm ch?m s? suy y?u x??ng. Prolia ???c dng ?? ?i?u tr? ch?ng long x??ng ? ph? n? sau khi mn kinh v ? nam gi?i. Xgeva ???c dng ?? phng ng?a gy x??ng v cc v?n ?? khc v? x??ng gy ra b?i di c?n x??ng do ung th?. Xgeva c?ng ???c dng ?? ?i?u tr? b??u t? bo kh?ng l? (giant cell tumor) c?a x??ng. Thu?c ny c th? ???c dng cho nh?ng m?c ?ch khc; hy h?i ng??i cung c?p d?ch v? y t? ho?c d??c s? c?a mnh, n?u qu v? c th?c m?c. Ti c?n ph?i bo cho ng??i cung c?p d?ch v? y t? c?a mnh ?i?u g tr??c khi dng thu?c ny? H? c?n bi?t li?u qu v? c b?t k? tnh tr?ng no sau ?y khng: -b?nh v? r?ng -chm da -nhi?m trng ho?c ti?n s? nhi?m trng -b?nh th?n ho?c n?u qu v? ?ang ???c th?m phn -m?c calci ho?c vitamin D trong mu th?p -h?i ch?ng r?i lo?n h?p th? -???c x?p vo l?ch ?? gi?i ph?u ho?c nh? r?ng -?ang du?ng thu?c co? ch??a denosumab -b?nh tuy?n gip tr?ng ho?c tuy?n c?n gip tr?ng -ph?n ?ng b?t th??ng v?i denosumab -pha?n ??ng b?t th???ng ho??c di? ??ng v??i ca?c d??c ph?m kha?c -pha?n ??ng b?t th???ng ho??c di? ??ng v??i th??c ph?m, thu?c nhu?m, ho??c ch?t ba?o qua?n -?ang c thai ho??c ??nh co? thai -?ang cho con bu? Ti nn s? d?ng thu?c ny nh? th? no? Thu?c ny ?? tim d??i da. Thu?c ny ???c s? d?ng b?i chuyn vin y t? ? b?nh vi?n ho?c ? phng m?ch. N?u qu v? s?p dng thu?c Prolia, d??c s? s? ??a cho qu v? m?t B?n H??ng D?n v? D??c Ph?m (MedGuide) ??c bi?t cho m?i toa thu?c v cho m?i l?n mua thm thu?c ?. Hy b?o ??m ??c k? thng tin ny m?i l?n. ??i v?i Prolia, hy bn v?i bc s? nhi khoa c?a qu v? v? vi?c dng thu?c ny ? tr? em. C th? c?n ch?m sc ??c bi?t. ??i v?i Xgeva, hy bn v?i bc s? nhi khoa c?a qu v? v? vi?c dng thu?c ny ? tr? em. Thu?c ny c th? ???c k toa cho tr? em ch? m?i 13 tu?i trong nh?ng tr??ng h?p ch?n l?c, nh?ng c?n ph?i th?n tr?ng. Qu li?u: N?u qu v? cho r?ng mnh ? dng qu nhi?u  thu?c ny, th hy lin l?c v?i trung tm ki?m sot ch?t ??c ho?c phng c?p c?u ngay l?p t?c. L?U : Thu?c ny ch? dnh ring cho qu v?. Khng chia s? thu?c ny v?i nh?ng ng??i khc. N?u ti l? qun m?t li?u th sao? ?i?u quan tr?ng l khng nn b? l? li?u thu?c no. Hy lin l?c v?i bc s? ho?c chuyn vin y t? c?a mnh, n?u qu v? khng th? gi? ?ng cu?c h?n khm. Nh?ng g c th? t??ng tc v?i thu?c ny? Khng ???c dng thu?c ny cng v?i b?t k? th? no sau ?y: -nh??ng thu?c kha?c co? ch??a denosumab Thu?c ny c?ng c th? t??ng tc v?i cc thu?c sau ?y: -cc thu?c ?c ch? h? mi?n d?ch -cc thu?c ?i?u tr? b?nh ung th? -cc thu?c steroid, ch?ng h?n nh? prednisone ho?c cortisone Danh sch ny c th? khng m t? ?? h?t cc t??ng tc c th? x?y ra. Hy ??a cho ng??i cung c?p d?ch v? y t? c?a mnh danh sch t?t c? cc thu?c, th?o d??c, cc thu?c khng c?n toa, ho?c cc   ch? ph?m b? sung m qu v? dng. C?ng nn bo cho h? bi?t r?ng qu v? c ht thu?c, u?ng r??u, ho?c c s? d?ng ma ty tri php hay khng. Vi th? c th? t??ng tc v?i thu?c c?a qu v?. Ti c?n ph?i theo di ?i?u g trong khi dng thu?c ny? Hy ?i g?p bc s? ho?c chuyn vin y t? ?? theo di ??nh k? s? c?i thi?n c?a qu v?. Bc s? c th? yu c?u th? mu v lm cc xt nghi?m khc ?? theo di s? c?i thi?n c?a qu v?. Hy lin l?c v?i bc s? ho?c chuyn vin y t?, n?u qu v? b? c?m l?nh ho?c b? b?nh nhi?m trng khc trong khi dng thu?c ny. Khng ???c t? ?i?u tr? cho mnh. Thu?c ny c th? lm gi?m kh? n?ng ch?ng l?i cc b?nh nhi?m trng c?a c? th?. Qu v? c?n ph?i ch?c ch?n r?ng c? th? qu v? nh?n ???c ??y ?? l??ng calci v vitamin D trong th?i gian dng thu?c ny, tr? khi bc s? yu c?u qu v? ??ng lm nh? v?y. Hy th?o lu?n v?i bc s? ho?c chuyn vin y t? v? nh?ng th?c ph?m qu v? ?n v nh?ng vitamin qu v? dng. Hy ?i nha s? th??ng xuyn. ?nh r?ng ho?c x?a r?ng b?ng ch? nha khoa nh? ? ???c ch? d?n. N?u qu v? c ?i lm r?ng, th  hy bo v?i nha s? r?ng qu v? ?ang dng thu?c ny. Khng ???c c thai trong th?i gian dng thu?c ny ho?c trong vng 5 thng sau khi ng?ng dng thu?c. Ph? n? c?n ph?i thng bo cho bc s? c?a mnh, n?u mu?n c thai ho?c ngh? r?ng c th? mnh ? c thai. C nguy c? v? cc tc d?ng ph? nghim tr?ng ??i v?i thai nhi. Hy th?o lu?n v?i bc s? ho?c chuyn vin y t? ho?c d??c s? ?? bi?t thm thng tin. Ti c th? nh?n th?y nh?ng tc d?ng ph? no khi dng thu?c ny? Nh?ng tc d?ng ph? qu v? c?n ph?i bo cho bc s? ho?c chuyn vin y t? cng s?m cng t?t: -cc ph?n ?ng d? ?ng, ch?ng h?n nh? da b? m?n ??, ng?a, n?i my ?ay, s?ng ? m?t, mi, ho?c l??i -cc v?n ?? v? h h?p -?au ng?c -tim ??p nhanh ho?c khng ??u -c?m th?y chong vng, ng?t x?u, b? t -s?t, ?n l?nh, ho?c b?t k? d?u hi?u nhi?m trng no khc -co th?t, c?ng, ho?c gi?t c? b?p -t ho?c c?m gic nh? b? ki?n b -da b? r?p ho?c c u c?c, ho?c da b? kh, bong trc ho?c ?? -v?t th??ng lu lnh ho?c ?au khng r nguyn nhn ? mi?ng ho?c hm -b? b?m tm ho?c xu?t huy?t b?t th??ng Cc tc d?ng ph? khng c?n ph?i ch?m sc y t? (Hy bo cho bc s? ho?c chuyn vin y t? n?u cc tc d?ng ph? ny ti?p di?n ho?c gy phi?n toi): -?au c? b?p -kh ch?u bao t?, ??y h?i Danh sch ny c th? khng m t? ?? h?t cc tc d?ng ph? c th? x?y ra. Xin g?i t?i bc s? c?a mnh ?? ???c c? v?n chuyn mn v? cc tc d?ng ph?. Qu v? c th? t??ng trnh cc tc d?ng ph? cho FDA theo s? [phone]. Ti nn c?t gi? thu?c c?a mnh ? ?u? Thu?c ny ch? ???c dng trong phng m?ch, v?n phng bc s? ho?c c? s? y t? khc v s? khng ???c c?t gi?   t?i nh. L?U : ?y l b?n tm t?t. N c th? khng bao hm t?t c? thng tin c th? c. N?u qu v? th?c m?c v? thu?c ny, xin trao ??i v?i bc s?, d??c s?, ho?c ng??i cung c?p d?ch v? y t? c?a mnh.    2016, Elsevier/Gold Standard. (2012-07-05 00:00:00)  

## 2016-08-27 ENCOUNTER — Encounter: Payer: Self-pay | Admitting: Hematology and Oncology

## 2016-08-27 NOTE — Progress Notes (Signed)
Patient Care Team: [name], MD as PCP - General (Internal Medicine)  DIAGNOSIS: Metastatic Breast cancer  SUMMARY OF ONCOLOGIC HISTORY:   Breast cancer of lower-outer quadrant of right female breast ([place])   01/04/1995 Surgery    Right breast cancer status post lumpectomy followed by chemotherapy followed by radiation and tamoxifen 5 years (details are not available)      12/30/2015 Relapse/Recurrence    Chest wall/ soft tissue masses near the right side of the sternum, superior nodule measuring 14.5 mm, inferior nodule measuring 10.5 mm, 26 mm anterior mediastinal soft tissue mass, T9 bone metastases      12/30/2015 Procedure    Thoracentesis: 400 mL removed; showed reactive mesothelial cells, no cancer cells      12/30/2015 Imaging    CT chest: 26 x 17 mm anterior mediastinal soft tissue mass, mixed lytic and sclerotic bone mets mainly involving sternum and T9 vertebral body, scattered vertebral body involvement and rib involvement; chest wall soft tissue masses 14.5 mm, 10.5 mm      01/13/2016 Initial Biopsy    Right chest wall mass biopsy 2:30 position: Invasive ductal carcinoma focally involves skeletal muscle, grade 2, HER-2 negative ratio 1.08, ER/PR 95% positive      01/14/2016 PET scan    Widespread metastatic disease right breast, right chest wall, mediastinum, right internal mammary lymph nodes, right pleural space, malignant pleural effusion, right common iliac lymph node, bone metastases in the thorax      01/21/2016 -  Anti-estrogen oral therapy    Ibrance with letrozole       CHIEF COMPLIANT: F/U on Ibrance and letrozole  INTERVAL HISTORY: Jessica Zavala is a 70 yr old with [place] currently on Ibrance and letrozole. Shes tolerating it well. Complains of occasional cough. Occasional pain 3/10, worsened with activity.  REVIEW OF SYSTEMS:   Constitutional: Denies fevers, chills or abnormal weight loss Eyes: Denies blurriness of vision Ears, nose, mouth, throat, and  face: Denies mucositis or sore throat Respiratory: Denies cough, dyspnea or wheezes Cardiovascular: Denies palpitation, chest discomfort Gastrointestinal:  Denies nausea, heartburn or change in bowel habits Skin: Denies abnormal skin rashes Lymphatics: Denies new lymphadenopathy or easy bruising Neurological:Denies numbness, tingling or new weaknesses Behavioral/Psych: Mood is stable, no new changes  Extremities: No lower extremity edema  All other systems were reviewed with the patient and are negative.  I have reviewed the past medical history, past surgical history, social history and family history with the patient and they are unchanged from previous note.  ALLERGIES:  is allergic to aspirin.  MEDICATIONS:  Current Outpatient Prescriptions  Medication Sig Dispense Refill  . acetaminophen (TYLENOL) 325 MG tablet Take 650 mg by mouth every 6 (six) hours as needed for mild pain.    . cromolyn (OPTICROM) 4 % ophthalmic solution Place 1 drop into the right eye 4 (four) times daily. Reported on 06/24/2016    . fluorometholone (FML) 0.1 % ophthalmic suspension Reported on 06/24/2016    . labetalol (NORMODYNE) 200 MG tablet Take 200 mg by mouth 2 (two) times daily.    Marland Kitchen letrozole (FEMARA) 2.5 MG tablet Take 1 tablet (2.5 mg total) by mouth daily. 90 tablet 3  . losartan (COZAAR) 100 MG tablet     . magic mouthwash w/lidocaine SOLN Take 5 mLs by mouth 4 (four) times daily as needed for mouth pain. 100 mL 0  . Menthol, Topical Analgesic, (BIOFREEZE COLORLESS) 4 % GEL Use in affected area TID prn (Patient not taking:  Reported on 02/02/2016) 100 mL 0  . olmesartan (BENICAR) 40 MG tablet Take 40 mg by mouth daily. Reported on 06/24/2016    . Omega-3 Fatty Acids (FISH OIL PO) Take 1 capsule by mouth daily.    . palbociclib (IBRANCE) 75 MG capsule Take 1 capsule (75 mg total) by mouth daily with breakfast. 3 weeks on 2 weeks off 21 capsule 3  . phenazopyridine (PYRIDIUM) 200 MG tablet Take 1 tablet  (200 mg total) by mouth 3 (three) times daily. (Patient not taking: Reported on 02/02/2016) 6 tablet 0  . simvastatin (ZOCOR) 40 MG tablet Take 40 mg by mouth daily. Reported on 06/24/2016    . triamcinolone (KENALOG) 0.1 % paste Use as directed 1 application in the mouth or throat 2 (two) times daily. 5 g 0   No current facility-administered medications for this visit.     PHYSICAL EXAMINATION: ECOG PERFORMANCE STATUS: 1  Vitals:   08/26/16 0826  BP: (!) 146/81  Pulse: 84  Resp: 18  Temp: 98 F (36.7 C)   Filed Weights   08/26/16 0826  Weight: 121 lb 3.2 oz (55 kg)    GENERAL:alert, no distress and comfortable SKIN: skin color, texture, turgor are normal, no rashes or significant lesions EYES: normal, Conjunctiva are pink and non-injected, sclera clear OROPHARYNX:no exudate, no erythema and lips, buccal mucosa, and tongue normal  NECK: supple, thyroid normal size, non-tender, without nodularity LYMPH:  no palpable lymphadenopathy in the cervical, axillary or inguinal LUNGS: clear to auscultation and percussion with normal breathing effort HEART: regular rate & rhythm and no murmurs and no lower extremity edema ABDOMEN:abdomen soft, non-tender and normal bowel sounds MUSCULOSKELETAL:no cyanosis of digits and no clubbing  NEURO: alert & oriented x 3 with fluent speech, no focal motor/sensory deficits EXTREMITIES: No lower extremity edema  LABORATORY DATA:  I have reviewed the data as listed   Chemistry      Component Value Date/Time   NA 137 08/26/2016 0816   K 5.2 (H) 08/26/2016 0816   CL 102 12/20/2015 0509   CO2 27 08/26/2016 0816   BUN 18.3 08/26/2016 0816   CREATININE 0.8 08/26/2016 0816      Component Value Date/Time   CALCIUM 9.5 08/26/2016 0816   ALKPHOS 41 08/26/2016 0816   AST 20 08/26/2016 0816   ALT 13 08/26/2016 0816   BILITOT 0.31 08/26/2016 0816       Lab Results  Component Value Date   WBC 3.1 (L) 08/26/2016   HGB 11.5 (L) 08/26/2016   HCT  34.5 (L) 08/26/2016   MCV 102.8 (H) 08/26/2016   PLT 214 08/26/2016   NEUTROABS 1.0 (L) 08/26/2016     ASSESSMENT & PLAN:  Breast cancer of lower-outer quadrant of right female breast ([place]) Metastatic breast cancer PET-CT 01/14/16: Widespread metastatic disease right breast, right chest wall several nodules largest 3.7 cm, mediastinum, right internal mammary lymph node 2.2 cm, right pleural-based nodules 2.1cm and 2.7 cm, malignant pleural effusion, right common iliac lymph node, bone metastases in the thorax (sternum, manubrium, multiple thoracic vertebral bodies, multiple right-sided ribs)  Right chest wall mass biopsy 2:30 position 01/13/2016: Invasive ductal carcinoma focally involves skeletal muscle, grade 2, HER-2 negative ratio 1.08, ER/PR pending (Right breast cancer 1996 status post lumpectomy followed by chemotherapy followed by radiation and tamoxifen 5 years (details are not available)) ---------------------------------------------------------------------------------------------------------------------------------------------------------- Treatment plan: Ibrance with letrozole started 01/20/2016 , cycle 3 Ibrance 100 mg, Cycle 4 Ibrance 75 mg dose; switch to 75 mg 3 weeks  on 2 weeks of from 07/22/2016 Ibrance toxicities: 1. Neutropenia: Today's ANC is 1.6 Currently on 75 mg dose. Plan is to then switch her to 75 mg 3 weeks on followed by 2 weeks off Denies any hot flashes or myalgias related to letrozole.  Bone metastases: We will change Xgeva to q 5 weeks. Patient currently takes calcium with vitamin D Hypertension: Instructed the patient to follow with her primary care physician CT chest abdomen pelvis 04/22/2016: Improvement in lymph nodes as well as bone metastases. No further pleural effusion. Excellent response to treatment.  RTC in 5 weeks for blood work check and follow up and scans November 2017.   Orders Placed This Encounter  Procedures  . CBC with  Differential    Standing Status:   Future    Standing Expiration Date:   08/26/2017  . Comprehensive metabolic panel    Standing Status:   Future    Standing Expiration Date:   08/26/2017   The patient has a good understanding of the overall plan. she agrees with it. she will call with any problems that may develop before the next visit here.   Rulon Eisenmenger, MD 08/27/16

## 2016-09-02 ENCOUNTER — Ambulatory Visit: Payer: Commercial Managed Care - HMO

## 2016-09-04 IMAGING — PT NM PET TUM IMG INITIAL (PI) SKULL BASE T - THIGH
1 of 7 series · 1 of 25 positions shown · non-contrast
Comparison: Chest CT 12/30/2015. CT the abdomen and pelvis
12/20/2015.

CLINICAL DATA: Subsequent Treatment strategy for breast cancer.
Restaging examination

EXAM:
NUCLEAR MEDICINE PET SKULL BASE TO THIGH
TECHNIQUE: 5.7 mCi F-18 FDG was injected intravenously. Full-ring PET imaging
was performed from the skull base to thigh after the radiotracer. CT
data was obtained and used for attenuation correction and anatomic
localization.
FASTING BLOOD GLUCOSE:  Value: 94 mg/dl

[Series 4: ct sk_thigh 5.0 b31f · axial · 5.0mm · 0.98mm/px · 1 of 197 slices shown]
[im 197/197  brain]
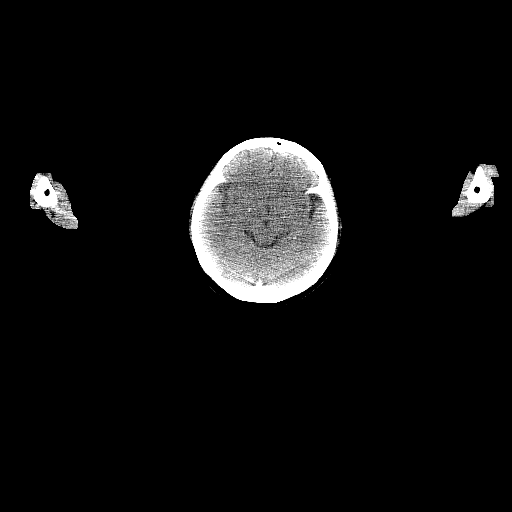

[1 of 25 positions shown; findings below may reference images not displayed]

FINDINGS: NECK

No hypermetabolic lymph nodes in the neck. There is diffuse
hypermetabolic activity in the larynx which is symmetric
bilaterally, presumably physiologic. Additionally, there is
extensive hypermetabolism throughout the oropharynx, which also
appears relatively symmetric, also favored to be physiologic.

CHEST

Several areas of soft tissue thickening thickening in the medial
aspect of the the right pectoralis major muscle extending to the
underlying chest wall, largest of which measures up to 1.3 x 3.7 cm
(image 64 of series 4) and is hypermetabolic (SUVmax = 14.2). In
addition, there is right internal mammary lymphadenopathy measuring
up to 1.7 x 2.2 cm (image 56 of series 4) which is hypermetabolic
(SUVmax = 15.0). Several pleural-based nodules are noted in the
right hemithorax, largest of which are located inferiorly (image 87
of series 4) measuring 2.1 x 1.1 cm and 2.7 x 2.7 cm (image 73 of
series 4) and are hypermetabolic (SUVmax = 6.2). This is associated
with a moderate right-sided pleural effusion which is presumably
malignant. Nonenlarged but hypermetabolic (SUVmax = 4.3) low right
paratracheal lymph node measuring 8 mm in short axis. Right juxta
phrenic lymph node measuring 7 mm in short axis (image 86 of series
4) is hypermetabolic (SUVmax = 4.8). No suspicious appearing
pulmonary nodules or masses. There is mild diffuse ground-glass
attenuation and interlobular septal thickening, suggesting
developing interstitial pulmonary edema. Heart size is mildly
enlarged. There is no significant pericardial fluid, thickening or
pericardial calcification. Atherosclerosis in the thoracic aorta and
great vessels of the mediastinum. Esophagus is unremarkable in
appearance. Status post right axillary nodal dissection. No
pathologically enlarged or hypermetabolic axillary lymph nodes.

ABDOMEN/PELVIS

No abnormal hypermetabolic activity within the liver, pancreas,
adrenal glands, or spleen. 9 mm short axis right common iliac lymph
node (image 132 of series 4) is hypermetabolic (SUVmax = 12.3).

SKELETON

Numerous mixed lytic and sclerotic lesions are noted throughout the
axial skeleton, most evident in the sternum, manubrium, multiple
thoracic vertebral bodies and multiple right-sided ribs, compatible
with widespread metastatic disease to the bones.
IMPRESSION: 1. Today's study demonstrates widespread metastatic disease
involving the medial aspect of the right breast, right chest wall,
mediastinum (most notable for right internal mammary lymph nodes),
right pleural space (including a malignant right pleural effusion),
an isolated lymph node in the right common iliac nodal chain, and
numerous osseous lesions in the thorax, as detailed above.
2. Additional incidental findings, as above.

## 2016-09-29 ENCOUNTER — Ambulatory Visit (HOSPITAL_BASED_OUTPATIENT_CLINIC_OR_DEPARTMENT_OTHER): Payer: Commercial Managed Care - HMO

## 2016-09-29 ENCOUNTER — Ambulatory Visit (HOSPITAL_BASED_OUTPATIENT_CLINIC_OR_DEPARTMENT_OTHER): Payer: Commercial Managed Care - HMO | Admitting: Hematology and Oncology

## 2016-09-29 ENCOUNTER — Encounter: Payer: Self-pay | Admitting: Hematology and Oncology

## 2016-09-29 ENCOUNTER — Other Ambulatory Visit (HOSPITAL_BASED_OUTPATIENT_CLINIC_OR_DEPARTMENT_OTHER): Payer: Commercial Managed Care - HMO

## 2016-09-29 DIAGNOSIS — C50511 Malignant neoplasm of lower-outer quadrant of right female breast: Secondary | ICD-10-CM | POA: Diagnosis not present

## 2016-09-29 DIAGNOSIS — C7951 Secondary malignant neoplasm of bone: Secondary | ICD-10-CM

## 2016-09-29 DIAGNOSIS — Z79811 Long term (current) use of aromatase inhibitors: Secondary | ICD-10-CM

## 2016-09-29 DIAGNOSIS — C778 Secondary and unspecified malignant neoplasm of lymph nodes of multiple regions: Secondary | ICD-10-CM

## 2016-09-29 DIAGNOSIS — Z170 Estrogen receptor positive status [ER+]: Principal | ICD-10-CM

## 2016-09-29 LAB — CBC WITH DIFFERENTIAL/PLATELET
BASO%: 1.2 % (ref 0.0–2.0)
BASOS ABS: 0 10*3/uL (ref 0.0–0.1)
EOS ABS: 0.1 10*3/uL (ref 0.0–0.5)
EOS%: 2.7 % (ref 0.0–7.0)
HEMATOCRIT: 33.1 % — AB (ref 34.8–46.6)
HGB: 11.6 g/dL (ref 11.6–15.9)
LYMPH#: 1.5 10*3/uL (ref 0.9–3.3)
LYMPH%: 58.4 % — AB (ref 14.0–49.7)
MCH: 34.5 pg — ABNORMAL HIGH (ref 25.1–34.0)
MCHC: 35 g/dL (ref 31.5–36.0)
MCV: 98.5 fL (ref 79.5–101.0)
MONO#: 0.3 10*3/uL (ref 0.1–0.9)
MONO%: 12.2 % (ref 0.0–14.0)
NEUT#: 0.7 10*3/uL — ABNORMAL LOW (ref 1.5–6.5)
NEUT%: 25.5 % — AB (ref 38.4–76.8)
PLATELETS: 183 10*3/uL (ref 145–400)
RBC: 3.36 10*6/uL — AB (ref 3.70–5.45)
RDW: 15.1 % — ABNORMAL HIGH (ref 11.2–14.5)
WBC: 2.6 10*3/uL — ABNORMAL LOW (ref 3.9–10.3)

## 2016-09-29 LAB — COMPREHENSIVE METABOLIC PANEL
ALBUMIN: 3.8 g/dL (ref 3.5–5.0)
ALT: 11 U/L (ref 0–55)
AST: 20 U/L (ref 5–34)
Alkaline Phosphatase: 47 U/L (ref 40–150)
Anion Gap: 8 mEq/L (ref 3–11)
BUN: 15.3 mg/dL (ref 7.0–26.0)
CALCIUM: 9.7 mg/dL (ref 8.4–10.4)
CHLORIDE: 101 meq/L (ref 98–109)
CO2: 29 mEq/L (ref 22–29)
CREATININE: 0.8 mg/dL (ref 0.6–1.1)
EGFR: 78 mL/min/{1.73_m2} — ABNORMAL LOW (ref >90)
Glucose: 98 mg/dl (ref 70–140)
Potassium: 5.1 mEq/L (ref 3.5–5.1)
Sodium: 139 mEq/L (ref 136–145)
TOTAL PROTEIN: 7.9 g/dL (ref 6.4–8.3)
Total Bilirubin: 0.37 mg/dL (ref 0.20–1.20)

## 2016-09-29 MED ORDER — DENOSUMAB 120 MG/1.7ML ~~LOC~~ SOLN
120.0000 mg | Freq: Once | SUBCUTANEOUS | Status: AC
  Administered 2016-09-29: 120 mg via SUBCUTANEOUS
  Filled 2016-09-29: qty 1.7

## 2016-09-29 MED FILL — IBRANCE 75 MG CAPSULE: 75 | 30 days supply | Qty: 21 | Fill #1

## 2016-09-29 NOTE — Progress Notes (Signed)
Patient Care Team: [name], MD as PCP - General (Internal Medicine)  DIAGNOSIS:  Encounter Diagnosis  Name Primary?  . Malignant neoplasm of lower-outer quadrant of right breast of female, estrogen receptor positive ([place])     SUMMARY OF ONCOLOGIC HISTORY:   Breast cancer of lower-outer quadrant of right female breast ([place])   01/04/1995 Surgery    Right breast cancer status post lumpectomy followed by chemotherapy followed by radiation and tamoxifen 5 years (details are not available)      12/30/2015 Relapse/Recurrence    Chest wall/ soft tissue masses near the right side of the sternum, superior nodule measuring 14.5 mm, inferior nodule measuring 10.5 mm, 26 mm anterior mediastinal soft tissue mass, T9 bone metastases      12/30/2015 Procedure    Thoracentesis: 400 mL removed; showed reactive mesothelial cells, no cancer cells      12/30/2015 Imaging    CT chest: 26 x 17 mm anterior mediastinal soft tissue mass, mixed lytic and sclerotic bone mets mainly involving sternum and T9 vertebral body, scattered vertebral body involvement and rib involvement; chest wall soft tissue masses 14.5 mm, 10.5 mm      01/13/2016 Initial Biopsy    Right chest wall mass biopsy 2:30 position: Invasive ductal carcinoma focally involves skeletal muscle, grade 2, HER-2 negative ratio 1.08, ER/PR 95% positive      01/14/2016 PET scan    Widespread metastatic disease right breast, right chest wall, mediastinum, right internal mammary lymph nodes, right pleural space, malignant pleural effusion, right common iliac lymph node, bone metastases in the thorax      01/21/2016 -  Anti-estrogen oral therapy    Ibrance with letrozole       CHIEF COMPLIANT: Follow-up on Ibrance with letrozole  INTERVAL HISTORY: Jessica Zavala is a 70 year old with above-mentioned history metastatic breast cancer with bone metastases who is here for follow-up on Ibrance with letrozole. She is currently on 75 mg of 5 g taking 3  weeks on 2 weeks off. She appears to be tolerating Ibrance extremely well. She denies any nausea vomiting or fatigue. Her biggest issue has been the neutrophil count. She has not had any infections or illnesses with the result of low ANC .She is getting Xgeva once every 5 weeks as well. Patient has sinus drainage issues and dry cough.   REVIEW OF SYSTEMS:   Constitutional: Denies fevers, chills or abnormal weight loss Eyes: Denies blurriness of vision Ears, nose, mouth, throat, and face: Sinus drainage issues and cough Respiratory: Denies cough, dyspnea or wheezes Cardiovascular: Denies palpitation, chest discomfort Gastrointestinal:  Denies nausea, heartburn or change in bowel habits Skin: Denies abnormal skin rashes Lymphatics: Denies new lymphadenopathy or easy bruising Neurological:Denies numbness, tingling or new weaknesses Behavioral/Psych: Mood is stable, no new changes  Extremities: No lower extremity edema Breast:  denies any pain or lumps or nodules in either breasts All other systems were reviewed with the patient and are negative.  I have reviewed the past medical history, past surgical history, social history and family history with the patient and they are unchanged from previous note.  ALLERGIES:  is allergic to aspirin.  MEDICATIONS:  Current Outpatient Prescriptions  Medication Sig Dispense Refill  . acetaminophen (TYLENOL) 325 MG tablet Take 650 mg by mouth every 6 (six) hours as needed for mild pain.    . cromolyn (OPTICROM) 4 % ophthalmic solution Place 1 drop into the right eye 4 (four) times daily. Reported on 06/24/2016    .  fluorometholone (FML) 0.1 % ophthalmic suspension Reported on 06/24/2016    . labetalol (NORMODYNE) 200 MG tablet Take 200 mg by mouth 2 (two) times daily.    Marland Kitchen letrozole (FEMARA) 2.5 MG tablet Take 1 tablet (2.5 mg total) by mouth daily. 90 tablet 3  . losartan (COZAAR) 100 MG tablet     . magic mouthwash w/lidocaine SOLN Take 5 mLs by mouth 4  (four) times daily as needed for mouth pain. 100 mL 0  . Menthol, Topical Analgesic, (BIOFREEZE COLORLESS) 4 % GEL Use in affected area TID prn (Patient not taking: Reported on 02/02/2016) 100 mL 0  . olmesartan (BENICAR) 40 MG tablet Take 40 mg by mouth daily. Reported on 06/24/2016    . Omega-3 Fatty Acids (FISH OIL PO) Take 1 capsule by mouth daily.    . palbociclib (IBRANCE) 75 MG capsule Take 1 capsule (75 mg total) by mouth daily with breakfast. 3 weeks on 2 weeks off 21 capsule 3  . phenazopyridine (PYRIDIUM) 200 MG tablet Take 1 tablet (200 mg total) by mouth 3 (three) times daily. (Patient not taking: Reported on 02/02/2016) 6 tablet 0  . simvastatin (ZOCOR) 40 MG tablet Take 40 mg by mouth daily. Reported on 06/24/2016    . triamcinolone (KENALOG) 0.1 % paste Use as directed 1 application in the mouth or throat 2 (two) times daily. 5 g 0   No current facility-administered medications for this visit.    Facility-Administered Medications Ordered in Other Visits  Medication Dose Route Frequency Provider Last Rate Last Dose  . denosumab (XGEVA) injection 120 mg  120 mg Subcutaneous Once [name], MD        PHYSICAL EXAMINATION: ECOG PERFORMANCE STATUS: 0 - Asymptomatic  Vitals:   09/29/16 0855  BP: (!) 142/67  Pulse: 73  Resp: 18  Temp: 98 F (36.7 C)   Filed Weights   09/29/16 0855  Weight: 121 lb 11.2 oz (55.2 kg)    GENERAL:alert, no distress and comfortable SKIN: skin color, texture, turgor are normal, no rashes or significant lesions EYES: normal, Conjunctiva are pink and non-injected, sclera clear OROPHARYNX:no exudate, no erythema and lips, buccal mucosa, and tongue normal  NECK: supple, thyroid normal size, non-tender, without nodularity LYMPH:  no palpable lymphadenopathy in the cervical, axillary or inguinal LUNGS: clear to auscultation and percussion with normal breathing effort HEART: regular rate & rhythm and no murmurs and no lower extremity  edema ABDOMEN:abdomen soft, non-tender and normal bowel sounds MUSCULOSKELETAL:no cyanosis of digits and no clubbing  NEURO: alert & oriented x 3 with fluent speech, no focal motor/sensory deficits EXTREMITIES: No lower extremity edema  LABORATORY DATA:  I have reviewed the data as listed   Chemistry      Component Value Date/Time   NA 139 09/29/2016 0833   K 5.1 09/29/2016 0833   CL 102 12/20/2015 0509   CO2 29 09/29/2016 0833   BUN 15.3 09/29/2016 0833   CREATININE 0.8 09/29/2016 0833      Component Value Date/Time   CALCIUM 9.7 09/29/2016 0833   ALKPHOS 47 09/29/2016 0833   AST 20 09/29/2016 0833   ALT 11 09/29/2016 0833   BILITOT 0.37 09/29/2016 0833       Lab Results  Component Value Date   WBC 2.6 (L) 09/29/2016   HGB 11.6 09/29/2016   HCT 33.1 (L) 09/29/2016   MCV 98.5 09/29/2016   PLT 183 09/29/2016   NEUTROABS 0.7 (L) 09/29/2016     ASSESSMENT & PLAN:  Breast cancer of lower-outer quadrant of right female breast [facility]) Metastatic breast cancer PET-CT 01/14/16: Widespread metastatic disease right breast, right chest wall several nodules largest 3.7 cm, mediastinum, right internal mammary lymph node 2.2 cm, right pleural-based nodules 2.1cm and 2.7 cm, malignant pleural effusion, right common iliac lymph node, bone metastases in the thorax (sternum, manubrium, multiple thoracic vertebral bodies, multiple right-sided ribs)  Right chest wall mass biopsy 2:30 position 01/13/2016: Invasive ductal carcinoma focally involves skeletal muscle, grade 2, HER-2 negative ratio 1.08, ER/PR pending (Right breast cancer 1996 status post lumpectomy followed by chemotherapy followed by radiation and tamoxifen 5 years (details are not available)) ---------------------------------------------------------------------------------------------------------------------------------------------------------- Treatment plan: Ibrance with letrozole started 01/20/2016 , cycle 3 Ibrance 100 mg,  Cycle 4 Ibrance 75 mg dose; switch to 75 mg 3 weeks on 2 weeks of from 07/22/2016  Ibrance toxicities: 1. Neutropenia: Today's ANC is 1.6 Currently on 75 mg dose. Plan is to then switch her to 75 mg 3 weeks on followed by 2 weeks off Denies any hot flashes or myalgias related to letrozole.  Bone metastases: On Xgeva to q 5weeks. Patient currently takes calcium with vitamin D Hypertension: Instructed the patient to follow with her primary care physician CT chest abdomen pelvis 04/22/2016: Improvement in lymph nodes as well as bone metastases. No further pleural effusion. Excellent response to treatment.  RTC in 5weeks for blood work check and follow up of scans  to be done  November 2017.   Orders Placed This Encounter  Procedures  . CT Chest W Contrast    Standing Status:   Future    Standing Expiration Date:   09/29/2017    Order Specific Question:   If indicated for the ordered procedure, I authorize the administration of contrast media per Radiology protocol    Answer:   Yes    Order Specific Question:   Reason for Exam (SYMPTOM  OR DIAGNOSIS REQUIRED)    Answer:   metastatic breast cancer restaging    Order Specific Question:   Preferred imaging location?    Answer:   [facility]  . CT Abdomen Pelvis W Contrast    Standing Status:   Future    Standing Expiration Date:   09/29/2017    Order Specific Question:   If indicated for the ordered procedure, I authorize the administration of contrast media per Radiology protocol    Answer:   Yes    Order Specific Question:   Reason for Exam (SYMPTOM  OR DIAGNOSIS REQUIRED)    Answer:   metastatic breast cancer restaging    Order Specific Question:   Preferred imaging location?    Answer:   [facility]   The patient has a good understanding of the overall plan. she agrees with it. she will call with any problems that may develop before the next visit here.   Rulon Eisenmenger, MD 09/29/16

## 2016-09-29 NOTE — Assessment & Plan Note (Signed)
Metastatic breast cancer PET-CT 01/14/16: Widespread metastatic disease right breast, right chest wall several nodules largest 3.7 cm, mediastinum, right internal mammary lymph node 2.2 cm, right pleural-based nodules 2.1cm and 2.7 cm, malignant pleural effusion, right common iliac lymph node, bone metastases in the thorax (sternum, manubrium, multiple thoracic vertebral bodies, multiple right-sided ribs)  Right chest wall mass biopsy 2:30 position 01/13/2016: Invasive ductal carcinoma focally involves skeletal muscle, grade 2, HER-2 negative ratio 1.08, ER/PR pending (Right breast cancer 1996 status post lumpectomy followed by chemotherapy followed by radiation and tamoxifen 5 years (details are not available)) ---------------------------------------------------------------------------------------------------------------------------------------------------------- Treatment plan: Ibrance with letrozole started 01/20/2016 , cycle 3 Ibrance 100 mg, Cycle 4 Ibrance 75 mg dose; switch to 75 mg 3 weeks on 2 weeks of from 07/22/2016  Ibrance toxicities: 1. Neutropenia: Today's ANC is 1.6 Currently on 75 mg dose. Plan is to then switch her to 75 mg 3 weeks on followed by 2 weeks off Denies any hot flashes or myalgias related to letrozole.  Bone metastases: On Xgeva to q 5weeks. Patient currently takes calcium with vitamin D Hypertension: Instructed the patient to follow with her primary care physician CT chest abdomen pelvis 04/22/2016: Improvement in lymph nodes as well as bone metastases. No further pleural effusion. Excellent response to treatment.  RTC in 5weeks for blood work check and follow up of scans to be done  November 2017.

## 2016-09-29 NOTE — Patient Instructions (Signed)
Denosumab injection ?y l thu?c g? DENOSUMAB lm ch?m s? suy y?u x??ng. Prolia ???c dng ?? ?i?u tr? ch?ng long x??ng ? ph? n? sau khi mn kinh v ? nam gi?i. Xgeva ???c dng ?? phng ng?a gy x??ng v cc v?n ?? khc v? x??ng gy ra b?i di c?n x??ng do ung th?. Xgeva c?ng ???c dng ?? ?i?u tr? b??u t? bo kh?ng l? (giant cell tumor) c?a x??ng. Thu?c ny c th? ???c dng cho nh?ng m?c ?ch khc; hy h?i ng??i cung c?p d?ch v? y t? ho?c d??c s? c?a mnh, n?u qu v? c th?c m?c. Ti c?n ph?i bo cho ng??i cung c?p d?ch v? y t? c?a mnh ?i?u g tr??c khi dng thu?c ny? H? c?n bi?t li?u qu v? c b?t k? tnh tr?ng no sau ?y khng: -b?nh v? r?ng -chm da -nhi?m trng ho?c ti?n s? nhi?m trng -b?nh th?n ho?c n?u qu v? ?ang ???c th?m phn -m?c calci ho?c vitamin D trong mu th?p -h?i ch?ng r?i lo?n h?p th? -???c x?p vo l?ch ?? gi?i ph?u ho?c nh? r?ng -?ang du?ng thu?c co? ch??a denosumab -b?nh tuy?n gip tr?ng ho?c tuy?n c?n gip tr?ng -ph?n ?ng b?t th??ng v?i denosumab -pha?n ??ng b?t th???ng ho??c di? ??ng v??i ca?c d??c ph?m kha?c -pha?n ??ng b?t th???ng ho??c di? ??ng v??i th??c ph?m, thu?c nhu?m, ho??c ch?t ba?o qua?n -?ang c thai ho??c ??nh co? thai -?ang cho con bu? Ti nn s? d?ng thu?c ny nh? th? no? Thu?c ny ?? tim d??i da. Thu?c ny ???c s? d?ng b?i chuyn vin y t? ? b?nh vi?n ho?c ? phng m?ch. N?u qu v? s?p dng thu?c Prolia, d??c s? s? ??a cho qu v? m?t B?n H??ng D?n v? D??c Ph?m (MedGuide) ??c bi?t cho m?i toa thu?c v cho m?i l?n mua thm thu?c ?. Hy b?o ??m ??c k? thng tin ny m?i l?n. ??i v?i Prolia, hy bn v?i bc s? nhi khoa c?a qu v? v? vi?c dng thu?c ny ? tr? em. C th? c?n ch?m sc ??c bi?t. ??i v?i Xgeva, hy bn v?i bc s? nhi khoa c?a qu v? v? vi?c dng thu?c ny ? tr? em. Thu?c ny c th? ???c k toa cho tr? em ch? m?i 13 tu?i trong nh?ng tr??ng h?p ch?n l?c, nh?ng c?n ph?i th?n tr?ng. Qu li?u: N?u qu v? cho r?ng mnh ? dng qu nhi?u  thu?c ny, th hy lin l?c v?i trung tm ki?m sot ch?t ??c ho?c phng c?p c?u ngay l?p t?c. L?U : Thu?c ny ch? dnh ring cho qu v?. Khng chia s? thu?c ny v?i nh?ng ng??i khc. N?u ti l? qun m?t li?u th sao? ?i?u quan tr?ng l khng nn b? l? li?u thu?c no. Hy lin l?c v?i bc s? ho?c chuyn vin y t? c?a mnh, n?u qu v? khng th? gi? ?ng cu?c h?n khm. Nh?ng g c th? t??ng tc v?i thu?c ny? Khng ???c dng thu?c ny cng v?i b?t k? th? no sau ?y: -nh??ng thu?c kha?c co? ch??a denosumab Thu?c ny c?ng c th? t??ng tc v?i cc thu?c sau ?y: -cc thu?c ?c ch? h? mi?n d?ch -cc thu?c ?i?u tr? b?nh ung th? -cc thu?c steroid, ch?ng h?n nh? prednisone ho?c cortisone Danh sch ny c th? khng m t? ?? h?t cc t??ng tc c th? x?y ra. Hy ??a cho ng??i cung c?p d?ch v? y t? c?a mnh danh sch t?t c? cc thu?c, th?o d??c, cc thu?c khng c?n toa, ho?c cc   ch? ph?m b? sung m qu v? dng. C?ng nn bo cho h? bi?t r?ng qu v? c ht thu?c, u?ng r??u, ho?c c s? d?ng ma ty tri php hay khng. Vi th? c th? t??ng tc v?i thu?c c?a qu v?. Ti c?n ph?i theo di ?i?u g trong khi dng thu?c ny? Hy ?i g?p bc s? ho?c chuyn vin y t? ?? theo di ??nh k? s? c?i thi?n c?a qu v?. Bc s? c th? yu c?u th? mu v lm cc xt nghi?m khc ?? theo di s? c?i thi?n c?a qu v?. Hy lin l?c v?i bc s? ho?c chuyn vin y t?, n?u qu v? b? c?m l?nh ho?c b? b?nh nhi?m trng khc trong khi dng thu?c ny. Khng ???c t? ?i?u tr? cho mnh. Thu?c ny c th? lm gi?m kh? n?ng ch?ng l?i cc b?nh nhi?m trng c?a c? th?. Qu v? c?n ph?i ch?c ch?n r?ng c? th? qu v? nh?n ???c ??y ?? l??ng calci v vitamin D trong th?i gian dng thu?c ny, tr? khi bc s? yu c?u qu v? ??ng lm nh? v?y. Hy th?o lu?n v?i bc s? ho?c chuyn vin y t? v? nh?ng th?c ph?m qu v? ?n v nh?ng vitamin qu v? dng. Hy ?i nha s? th??ng xuyn. ?nh r?ng ho?c x?a r?ng b?ng ch? nha khoa nh? ? ???c ch? d?n. N?u qu v? c ?i lm r?ng, th  hy bo v?i nha s? r?ng qu v? ?ang dng thu?c ny. Khng ???c c thai trong th?i gian dng thu?c ny ho?c trong vng 5 thng sau khi ng?ng dng thu?c. Ph? n? c?n ph?i thng bo cho bc s? c?a mnh, n?u mu?n c thai ho?c ngh? r?ng c th? mnh ? c thai. C nguy c? v? cc tc d?ng ph? nghim tr?ng ??i v?i thai nhi. Hy th?o lu?n v?i bc s? ho?c chuyn vin y t? ho?c d??c s? ?? bi?t thm thng tin. Ti c th? nh?n th?y nh?ng tc d?ng ph? no khi dng thu?c ny? Nh?ng tc d?ng ph? qu v? c?n ph?i bo cho bc s? ho?c chuyn vin y t? cng s?m cng t?t: -cc ph?n ?ng d? ?ng, ch?ng h?n nh? da b? m?n ??, ng?a, n?i my ?ay, s?ng ? m?t, mi, ho?c l??i -cc v?n ?? v? h h?p -?au ng?c -tim ??p nhanh ho?c khng ??u -c?m th?y chong vng, ng?t x?u, b? t -s?t, ?n l?nh, ho?c b?t k? d?u hi?u nhi?m trng no khc -co th?t, c?ng, ho?c gi?t c? b?p -t ho?c c?m gic nh? b? ki?n b -da b? r?p ho?c c u c?c, ho?c da b? kh, bong trc ho?c ?? -v?t th??ng lu lnh ho?c ?au khng r nguyn nhn ? mi?ng ho?c hm -b? b?m tm ho?c xu?t huy?t b?t th??ng Cc tc d?ng ph? khng c?n ph?i ch?m sc y t? (Hy bo cho bc s? ho?c chuyn vin y t? n?u cc tc d?ng ph? ny ti?p di?n ho?c gy phi?n toi): -?au c? b?p -kh ch?u bao t?, ??y h?i Danh sch ny c th? khng m t? ?? h?t cc tc d?ng ph? c th? x?y ra. Xin g?i t?i bc s? c?a mnh ?? ???c c? v?n chuyn mn v? cc tc d?ng ph?. Qu v? c th? t??ng trnh cc tc d?ng ph? cho FDA theo s? [phone]. Ti nn c?t gi? thu?c c?a mnh ? ?u? Thu?c ny ch? ???c dng trong phng m?ch, v?n phng bc s? ho?c c? s? y t? khc v s? khng ???c c?t gi?   t?i nh. L?U : ?y l b?n tm t?t. N c th? khng bao hm t?t c? thng tin c th? c. N?u qu v? th?c m?c v? thu?c ny, xin trao ??i v?i bc s?, d??c s?, ho?c ng??i cung c?p d?ch v? y t? c?a mnh.    2016, Elsevier/Gold Standard. (2012-07-05 00:00:00)  

## 2016-09-30 ENCOUNTER — Other Ambulatory Visit: Payer: Commercial Managed Care - HMO

## 2016-09-30 ENCOUNTER — Ambulatory Visit: Payer: Commercial Managed Care - HMO

## 2016-10-03 DIAGNOSIS — M8588 Other specified disorders of bone density and structure, other site: Secondary | ICD-10-CM | POA: Diagnosis not present

## 2016-10-03 DIAGNOSIS — Z6826 Body mass index (BMI) 26.0-26.9, adult: Secondary | ICD-10-CM | POA: Diagnosis not present

## 2016-10-03 DIAGNOSIS — N958 Other specified menopausal and perimenopausal disorders: Secondary | ICD-10-CM | POA: Diagnosis not present

## 2016-10-03 DIAGNOSIS — Z779 Other contact with and (suspected) exposures hazardous to health: Secondary | ICD-10-CM | POA: Diagnosis not present

## 2016-10-10 ENCOUNTER — Ambulatory Visit (INDEPENDENT_AMBULATORY_CARE_PROVIDER_SITE_OTHER): Payer: Commercial Managed Care - HMO | Admitting: Family Medicine

## 2016-10-10 VITALS — BP 128/84 | HR 77 | Temp 97.8°F | Resp 16 | Ht <= 58 in | Wt 122.2 lb

## 2016-10-10 DIAGNOSIS — L245 Irritant contact dermatitis due to other chemical products: Secondary | ICD-10-CM | POA: Diagnosis not present

## 2016-10-10 DIAGNOSIS — T2040XA Corrosion of unspecified degree of head, face, and neck, unspecified site, initial encounter: Secondary | ICD-10-CM

## 2016-10-10 NOTE — Patient Instructions (Addendum)
  YOU ARE SCHEDULED FOR AN APPOINTMENT WITH [facility] AT 11;10 THIS MORNING. THE ADDRESS IS [place].   IF you received an x-ray today, you will receive an invoice from [facility]. Please contact [facility] at [phone] with questions or concerns regarding your invoice.   IF you received labwork today, you will receive an invoice from [employer]. Please contact [facility] at [phone] with questions or concerns regarding your invoice.   Our billing staff will not be able to assist you with questions regarding bills from these companies.  You will be contacted with the lab results as soon as they are available. The fastest way to get your results is to activate your My Chart account. Instructions are located on the last page of this paperwork. If you have not heard from [place] regarding the results in 2 weeks, please contact this office.
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
## 2016-10-21 DIAGNOSIS — I10 Essential (primary) hypertension: Secondary | ICD-10-CM | POA: Diagnosis not present

## 2016-10-21 DIAGNOSIS — D6481 Anemia due to antineoplastic chemotherapy: Secondary | ICD-10-CM | POA: Diagnosis not present

## 2016-10-21 DIAGNOSIS — L408 Other psoriasis: Secondary | ICD-10-CM | POA: Diagnosis not present

## 2016-10-21 DIAGNOSIS — R05 Cough: Secondary | ICD-10-CM | POA: Diagnosis not present

## 2016-10-21 DIAGNOSIS — E784 Other hyperlipidemia: Secondary | ICD-10-CM | POA: Diagnosis not present

## 2016-10-21 DIAGNOSIS — D702 Other drug-induced agranulocytosis: Secondary | ICD-10-CM | POA: Diagnosis not present

## 2016-10-21 DIAGNOSIS — C7951 Secondary malignant neoplasm of bone: Secondary | ICD-10-CM | POA: Diagnosis not present

## 2016-10-21 DIAGNOSIS — C50511 Malignant neoplasm of lower-outer quadrant of right female breast: Secondary | ICD-10-CM | POA: Diagnosis not present

## 2016-10-21 DIAGNOSIS — R946 Abnormal results of thyroid function studies: Secondary | ICD-10-CM | POA: Diagnosis not present

## 2016-11-03 ENCOUNTER — Ambulatory Visit (HOSPITAL_COMMUNITY)
Admission: RE | Admit: 2016-11-03 | Discharge: 2016-11-03 | Disposition: A | Discharge status: Home / Self Care | Payer: Commercial Managed Care - HMO | Source: Ambulatory Visit | Attending: Hematology and Oncology | Admitting: Hematology and Oncology

## 2016-11-03 DIAGNOSIS — Z170 Estrogen receptor positive status [ER+]: Secondary | ICD-10-CM | POA: Insufficient documentation

## 2016-11-03 DIAGNOSIS — I700 Atherosclerosis of aorta: Secondary | ICD-10-CM | POA: Diagnosis not present

## 2016-11-03 DIAGNOSIS — C50511 Malignant neoplasm of lower-outer quadrant of right female breast: Secondary | ICD-10-CM | POA: Insufficient documentation

## 2016-11-03 DIAGNOSIS — I517 Cardiomegaly: Secondary | ICD-10-CM | POA: Diagnosis not present

## 2016-11-03 DIAGNOSIS — M899 Disorder of bone, unspecified: Secondary | ICD-10-CM | POA: Diagnosis not present

## 2016-11-03 DIAGNOSIS — R935 Abnormal findings on diagnostic imaging of other abdominal regions, including retroperitoneum: Secondary | ICD-10-CM | POA: Diagnosis not present

## 2016-11-03 MED ORDER — SODIUM CHLORIDE 0.9 % IJ SOLN
INTRAMUSCULAR | Status: AC
  Filled 2016-11-03: qty 50

## 2016-11-03 MED ORDER — IOPAMIDOL (ISOVUE-300) INJECTION 61%
100.0000 mL | Freq: Once | INTRAVENOUS | Status: AC | PRN
  Administered 2016-11-03: 100 mL via INTRAVENOUS

## 2016-11-03 MED ORDER — IOPAMIDOL (ISOVUE-300) INJECTION 61%
INTRAVENOUS | Status: AC
  Filled 2016-11-03: qty 100

## 2016-11-03 NOTE — Assessment & Plan Note (Signed)
Metastatic breast cancer PET-CT 01/14/16: Widespread metastatic disease right breast, right chest wall several nodules largest 3.7 cm, mediastinum, right internal mammary lymph node 2.2 cm, right pleural-based nodules 2.1cm and 2.7 cm, malignant pleural effusion, right common iliac lymph node, bone metastases in the thorax (sternum, manubrium, multiple thoracic vertebral bodies, multiple right-sided ribs)  Right chest wall mass biopsy 2:30 position 01/13/2016: Invasive ductal carcinoma focally involves skeletal muscle, grade 2, HER-2 negative ratio 1.08, ER/PR pending (Right breast cancer 1996 status post lumpectomy followed by chemotherapy followed by radiation and tamoxifen 5 years (details are not available)) ---------------------------------------------------------------------------------------------------------------------------------------------------------- Treatment plan: Ibrance with letrozole started 01/20/2016 , cycle 3 Ibrance 100 mg, Cycle 4 Ibrance 75 mg dose; switch to 75 mg 3 weeks on 2 weeks of from 07/22/2016  Ibrance toxicities: 1. Neutropenia: Today's ANC is 1.6 Currently on 75 mg dose. Plan is to then switch her to 75 mg 3 weeks on followed by 2 weeks off Denies any hot flashes or myalgias related to letrozole.  Bone metastases: On Xgeva to q 5weeks. Patient currently takes calcium with vitamin D Hypertension: Instructed the patient to follow with her primary care physician CT chest abdomen pelvis 11/03/2016: Stable internal mamm LN, Mixed lesions in T spine and ribs unchanged No further pleural effusion. Excellent response to treatment.  RTC in 5weeks for blood work check

## 2016-11-04 ENCOUNTER — Encounter: Payer: Self-pay | Admitting: Hematology and Oncology

## 2016-11-04 ENCOUNTER — Other Ambulatory Visit (HOSPITAL_BASED_OUTPATIENT_CLINIC_OR_DEPARTMENT_OTHER): Payer: Commercial Managed Care - HMO

## 2016-11-04 ENCOUNTER — Ambulatory Visit (HOSPITAL_BASED_OUTPATIENT_CLINIC_OR_DEPARTMENT_OTHER): Payer: Commercial Managed Care - HMO

## 2016-11-04 ENCOUNTER — Ambulatory Visit (HOSPITAL_BASED_OUTPATIENT_CLINIC_OR_DEPARTMENT_OTHER): Payer: Commercial Managed Care - HMO | Admitting: Hematology and Oncology

## 2016-11-04 DIAGNOSIS — Z170 Estrogen receptor positive status [ER+]: Secondary | ICD-10-CM

## 2016-11-04 DIAGNOSIS — D701 Agranulocytosis secondary to cancer chemotherapy: Secondary | ICD-10-CM | POA: Diagnosis not present

## 2016-11-04 DIAGNOSIS — C50511 Malignant neoplasm of lower-outer quadrant of right female breast: Secondary | ICD-10-CM

## 2016-11-04 DIAGNOSIS — C7951 Secondary malignant neoplasm of bone: Secondary | ICD-10-CM

## 2016-11-04 DIAGNOSIS — Z79811 Long term (current) use of aromatase inhibitors: Secondary | ICD-10-CM

## 2016-11-04 LAB — COMPREHENSIVE METABOLIC PANEL
ALBUMIN: 3.7 g/dL (ref 3.5–5.0)
ALK PHOS: 40 U/L (ref 40–150)
ALT: 13 U/L (ref 0–55)
AST: 21 U/L (ref 5–34)
Anion Gap: 8 mEq/L (ref 3–11)
BUN: 15.2 mg/dL (ref 7.0–26.0)
CALCIUM: 9.6 mg/dL (ref 8.4–10.4)
CHLORIDE: 98 meq/L (ref 98–109)
CO2: 29 mEq/L (ref 22–29)
Creatinine: 0.8 mg/dL (ref 0.6–1.1)
EGFR: 72 mL/min/{1.73_m2} — AB (ref >90)
Glucose: 102 mg/dl (ref 70–140)
POTASSIUM: 5 meq/L (ref 3.5–5.1)
SODIUM: 135 meq/L — AB (ref 136–145)
Total Bilirubin: 0.46 mg/dL (ref 0.20–1.20)
Total Protein: 7.6 g/dL (ref 6.4–8.3)

## 2016-11-04 LAB — CBC WITH DIFFERENTIAL/PLATELET
BASO%: 1.2 % (ref 0.0–2.0)
BASOS ABS: 0 10*3/uL (ref 0.0–0.1)
EOS ABS: 0.1 10*3/uL (ref 0.0–0.5)
EOS%: 3.5 % (ref 0.0–7.0)
HEMATOCRIT: 33.6 % — AB (ref 34.8–46.6)
HGB: 11.7 g/dL (ref 11.6–15.9)
LYMPH%: 61.7 % — ABNORMAL HIGH (ref 14.0–49.7)
MCH: 34.1 pg — AB (ref 25.1–34.0)
MCHC: 34.8 g/dL (ref 31.5–36.0)
MCV: 98 fL (ref 79.5–101.0)
MONO#: 0.3 10*3/uL (ref 0.1–0.9)
MONO%: 11.7 % (ref 0.0–14.0)
NEUT#: 0.6 10*3/uL — ABNORMAL LOW (ref 1.5–6.5)
NEUT%: 21.9 % — AB (ref 38.4–76.8)
Platelets: 179 10*3/uL (ref 145–400)
RBC: 3.43 10*6/uL — ABNORMAL LOW (ref 3.70–5.45)
RDW: 15.6 % — ABNORMAL HIGH (ref 11.2–14.5)
WBC: 2.6 10*3/uL — ABNORMAL LOW (ref 3.9–10.3)
lymph#: 1.6 10*3/uL (ref 0.9–3.3)

## 2016-11-04 MED ORDER — DENOSUMAB 120 MG/1.7ML ~~LOC~~ SOLN
120.0000 mg | Freq: Once | SUBCUTANEOUS | Status: AC
  Administered 2016-11-04: 120 mg via SUBCUTANEOUS
  Filled 2016-11-04: qty 1.7

## 2016-11-04 MED ORDER — PALBOCICLIB 75 MG PO CAPS: 75.0000 mg | ORAL_CAPSULE | Freq: Every day | ORAL | 3 refills | Status: DC

## 2016-11-04 MED FILL — IBRANCE 75 MG CAPSULE: 75 | 30 days supply | Qty: 21 | Fill #2

## 2016-11-04 NOTE — Progress Notes (Signed)
Patient Care Team: [name], MD as PCP - General (Internal Medicine)  DIAGNOSIS:  Encounter Diagnosis  Name Primary?  . Malignant neoplasm of lower-outer quadrant of right breast of female, estrogen receptor positive ([place])     SUMMARY OF ONCOLOGIC HISTORY:   Breast cancer of lower-outer quadrant of right female breast ([place])   01/04/1995 Surgery    Right breast cancer status post lumpectomy followed by chemotherapy followed by radiation and tamoxifen 5 years (details are not available)      12/30/2015 Relapse/Recurrence    Chest wall/ soft tissue masses near the right side of the sternum, superior nodule measuring 14.5 mm, inferior nodule measuring 10.5 mm, 26 mm anterior mediastinal soft tissue mass, T9 bone metastases      12/30/2015 Procedure    Thoracentesis: 400 mL removed; showed reactive mesothelial cells, no cancer cells      12/30/2015 Imaging    CT chest: 26 x 17 mm anterior mediastinal soft tissue mass, mixed lytic and sclerotic bone mets mainly involving sternum and T9 vertebral body, scattered vertebral body involvement and rib involvement; chest wall soft tissue masses 14.5 mm, 10.5 mm      01/13/2016 Initial Biopsy    Right chest wall mass biopsy 2:30 position: Invasive ductal carcinoma focally involves skeletal muscle, grade 2, HER-2 negative ratio 1.08, ER/PR 95% positive      01/14/2016 PET scan    Widespread metastatic disease right breast, right chest wall, mediastinum, right internal mammary lymph nodes, right pleural space, malignant pleural effusion, right common iliac lymph node, bone metastases in the thorax      01/21/2016 -  Anti-estrogen oral therapy    Ibrance with letrozole       CHIEF COMPLIANT: Follow-up on Ibrance with letrozole and recent CT scans  INTERVAL HISTORY: Jessica Zavala is a 70 year old lady with above-mentioned history metastatic breast cancer currently on oral therapy with Ibrance letrozole. She had recent CT scans and is here  today to discuss the results. She is tolerating Ibrance extremely well. She has very mild occasional nausea. The scans show that he has stable disease. She no longer has pulmonary symptoms related to her prior pleural effusion.  REVIEW OF SYSTEMS:   Constitutional: Denies fevers, chills or abnormal weight loss Eyes: Denies blurriness of vision Ears, nose, mouth, throat, and face: Denies mucositis or sore throat Respiratory: Denies cough, dyspnea or wheezes Cardiovascular: Denies palpitation, chest discomfort Gastrointestinal:  Denies nausea, heartburn or change in bowel habits Skin: Denies abnormal skin rashes Lymphatics: Denies new lymphadenopathy or easy bruising Neurological:Denies numbness, tingling or new weaknesses Behavioral/Psych: Mood is stable, no new changes  Extremities: No lower extremity edema Breast:  denies any pain or lumps or nodules in either breasts All other systems were reviewed with the patient and are negative.  I have reviewed the past medical history, past surgical history, social history and family history with the patient and they are unchanged from previous note.  ALLERGIES:  is allergic to aspirin.  MEDICATIONS:  Current Outpatient Prescriptions  Medication Sig Dispense Refill  . acetaminophen (TYLENOL) 325 MG tablet Take 650 mg by mouth every 6 (six) hours as needed for mild pain.    . cromolyn (OPTICROM) 4 % ophthalmic solution Place 1 drop into the right eye 4 (four) times daily. Reported on 06/24/2016    . fluorometholone (FML) 0.1 % ophthalmic suspension Reported on 06/24/2016    . labetalol (NORMODYNE) 200 MG tablet Take 200 mg by mouth 2 (two) times daily.    Marland Kitchen  letrozole (FEMARA) 2.5 MG tablet Take 1 tablet (2.5 mg total) by mouth daily. 90 tablet 3  . losartan (COZAAR) 100 MG tablet     . Menthol, Topical Analgesic, (BIOFREEZE COLORLESS) 4 % GEL Use in affected area TID prn (Patient not taking: Reported on 10/10/2016) 100 mL 0  . olmesartan (BENICAR)  40 MG tablet Take 40 mg by mouth daily. Reported on 06/24/2016    . Omega-3 Fatty Acids (FISH OIL PO) Take 1 capsule by mouth daily.    . palbociclib (IBRANCE) 75 MG capsule Take 1 capsule (75 mg total) by mouth daily with breakfast. 2 weeks on 2 weeks off 21 capsule 3  . phenazopyridine (PYRIDIUM) 200 MG tablet Take 1 tablet (200 mg total) by mouth 3 (three) times daily. (Patient not taking: Reported on 10/10/2016) 6 tablet 0  . simvastatin (ZOCOR) 40 MG tablet Take 40 mg by mouth daily. Reported on 06/24/2016    . triamcinolone (KENALOG) 0.1 % paste Use as directed 1 application in the mouth or throat 2 (two) times daily. (Patient not taking: Reported on 10/10/2016) 5 g 0   No current facility-administered medications for this visit.     PHYSICAL EXAMINATION: ECOG PERFORMANCE STATUS: 1 - Symptomatic but completely ambulatory  Vitals:   11/04/16 0824  BP: 118/65  Pulse: 95  Resp: 18  Temp: 97.7 F (36.5 C)   Filed Weights   11/04/16 0824  Weight: 122 lb (55.3 kg)    GENERAL:alert, no distress and comfortable SKIN: skin color, texture, turgor are normal, no rashes or significant lesions EYES: normal, Conjunctiva are pink and non-injected, sclera clear OROPHARYNX:no exudate, no erythema and lips, buccal mucosa, and tongue normal  NECK: supple, thyroid normal size, non-tender, without nodularity LYMPH:  no palpable lymphadenopathy in the cervical, axillary or inguinal LUNGS: clear to auscultation and percussion with normal breathing effort HEART: regular rate & rhythm and no murmurs and no lower extremity edema ABDOMEN:abdomen soft, non-tender and normal bowel sounds MUSCULOSKELETAL:no cyanosis of digits and no clubbing  NEURO: alert & oriented x 3 with fluent speech, no focal motor/sensory deficits EXTREMITIES: No lower extremity edema   LABORATORY DATA:  I have reviewed the data as listed   Chemistry      Component Value Date/Time   NA 135 (L) 11/04/2016 0813   K 5.0  11/04/2016 0813   CL 102 12/20/2015 0509   CO2 29 11/04/2016 0813   BUN 15.2 11/04/2016 0813   CREATININE 0.8 11/04/2016 0813      Component Value Date/Time   CALCIUM 9.6 11/04/2016 0813   ALKPHOS 40 11/04/2016 0813   AST 21 11/04/2016 0813   ALT 13 11/04/2016 0813   BILITOT 0.46 11/04/2016 0813       Lab Results  Component Value Date   WBC 2.6 (L) 11/04/2016   HGB 11.7 11/04/2016   HCT 33.6 (L) 11/04/2016   MCV 98.0 11/04/2016   PLT 179 11/04/2016   NEUTROABS 0.6 (L) 11/04/2016    ASSESSMENT & PLAN:  Breast cancer of lower-outer quadrant of right female breast ([place]) Metastatic breast cancer PET-CT 01/14/16: Widespread metastatic disease right breast, right chest wall several nodules largest 3.7 cm, mediastinum, right internal mammary lymph node 2.2 cm, right pleural-based nodules 2.1cm and 2.7 cm, malignant pleural effusion, right common iliac lymph node, bone metastases in the thorax (sternum, manubrium, multiple thoracic vertebral bodies, multiple right-sided ribs)  Right chest wall mass biopsy 2:30 position 01/13/2016: Invasive ductal carcinoma focally involves skeletal muscle, grade 2, HER-2  negative ratio 1.08, ER/PR pending (Right breast cancer 1996 status post lumpectomy followed by chemotherapy followed by radiation and tamoxifen 5 years (details are not available)) ---------------------------------------------------------------------------------------------------------------------------------------------------------- Treatment plan: Ibrance with letrozole started 01/20/2016 , cycle 3 Ibrance 100 mg, Cycle 4 Ibrance 75 mg dose; switch to 75 mg 3 weeks on 2 weeks of from 07/22/2016  Ibrance toxicities: 1. Neutropenia: Today's ANC is 0.6 Currently on 75 mg dose. Plan is to then switch her to 75 mg 2 weeks on followed by 2 weeks off Denies any hot flashes or myalgias related to letrozole.  Bone metastases: On Xgeva to q 4weeks. Patient currently takes calcium with  vitamin D Hypertension: Instructed the patient to follow with her primary care physician CT chest abdomen pelvis 11/03/2016: Stable internal mamm LN, Mixed lesions in T spine and ribs unchanged No further pleural effusion. Excellent response to treatment. Radiology review: I discussed with the patient that this cancer showing stable disease which is excellent.  RTC in 4weeks for blood work check    No orders of the defined types were placed in this encounter.  The patient has a good understanding of the overall plan. she agrees with it. she will call with any problems that may develop before the next visit here.   Rulon Eisenmenger, MD 11/04/16

## 2016-11-04 NOTE — Patient Instructions (Signed)
Denosumab injection ?y l thu?c g? DENOSUMAB lm ch?m s? suy y?u x??ng. Prolia ???c dng ?? ?i?u tr? ch?ng long x??ng ? ph? n? sau khi mn kinh v ? nam gi?i. Xgeva ???c dng ?? phng ng?a gy x??ng v cc v?n ?? khc v? x??ng gy ra b?i di c?n x??ng do ung th?. Xgeva c?ng ???c dng ?? ?i?u tr? b??u t? bo kh?ng l? (giant cell tumor) c?a x??ng. Thu?c ny c th? ???c dng cho nh?ng m?c ?ch khc; hy h?i ng??i cung c?p d?ch v? y t? ho?c d??c s? c?a mnh, n?u qu v? c th?c m?c. Ti c?n ph?i bo cho ng??i cung c?p d?ch v? y t? c?a mnh ?i?u g tr??c khi dng thu?c ny? H? c?n bi?t li?u qu v? c b?t k? tnh tr?ng no sau ?y khng: -b?nh v? r?ng -chm da -nhi?m trng ho?c ti?n s? nhi?m trng -b?nh th?n ho?c n?u qu v? ?ang ???c th?m phn -m?c calci ho?c vitamin D trong mu th?p -h?i ch?ng r?i lo?n h?p th? -???c x?p vo l?ch ?? gi?i ph?u ho?c nh? r?ng -?ang du?ng thu?c co? ch??a denosumab -b?nh tuy?n gip tr?ng ho?c tuy?n c?n gip tr?ng -ph?n ?ng b?t th??ng v?i denosumab -pha?n ??ng b?t th???ng ho??c di? ??ng v??i ca?c d??c ph?m kha?c -pha?n ??ng b?t th???ng ho??c di? ??ng v??i th??c ph?m, thu?c nhu?m, ho??c ch?t ba?o qua?n -?ang c thai ho??c ??nh co? thai -?ang cho con bu? Ti nn s? d?ng thu?c ny nh? th? no? Thu?c ny ?? tim d??i da. Thu?c ny ???c s? d?ng b?i chuyn vin y t? ? b?nh vi?n ho?c ? phng m?ch. N?u qu v? s?p dng thu?c Prolia, d??c s? s? ??a cho qu v? m?t B?n H??ng D?n v? D??c Ph?m (MedGuide) ??c bi?t cho m?i toa thu?c v cho m?i l?n mua thm thu?c ?. Hy b?o ??m ??c k? thng tin ny m?i l?n. ??i v?i Prolia, hy bn v?i bc s? nhi khoa c?a qu v? v? vi?c dng thu?c ny ? tr? em. C th? c?n ch?m sc ??c bi?t. ??i v?i Xgeva, hy bn v?i bc s? nhi khoa c?a qu v? v? vi?c dng thu?c ny ? tr? em. Thu?c ny c th? ???c k toa cho tr? em ch? m?i 13 tu?i trong nh?ng tr??ng h?p ch?n l?c, nh?ng c?n ph?i th?n tr?ng. Qu li?u: N?u qu v? cho r?ng mnh ? dng qu nhi?u  thu?c ny, th hy lin l?c v?i trung tm ki?m sot ch?t ??c ho?c phng c?p c?u ngay l?p t?c. L?U : Thu?c ny ch? dnh ring cho qu v?. Khng chia s? thu?c ny v?i nh?ng ng??i khc. N?u ti l? qun m?t li?u th sao? ?i?u quan tr?ng l khng nn b? l? li?u thu?c no. Hy lin l?c v?i bc s? ho?c chuyn vin y t? c?a mnh, n?u qu v? khng th? gi? ?ng cu?c h?n khm. Nh?ng g c th? t??ng tc v?i thu?c ny? Khng ???c dng thu?c ny cng v?i b?t k? th? no sau ?y: -nh??ng thu?c kha?c co? ch??a denosumab Thu?c ny c?ng c th? t??ng tc v?i cc thu?c sau ?y: -cc thu?c ?c ch? h? mi?n d?ch -cc thu?c ?i?u tr? b?nh ung th? -cc thu?c steroid, ch?ng h?n nh? prednisone ho?c cortisone Danh sch ny c th? khng m t? ?? h?t cc t??ng tc c th? x?y ra. Hy ??a cho ng??i cung c?p d?ch v? y t? c?a mnh danh sch t?t c? cc thu?c, th?o d??c, cc thu?c khng c?n toa, ho?c cc   ch? ph?m b? sung m qu v? dng. C?ng nn bo cho h? bi?t r?ng qu v? c ht thu?c, u?ng r??u, ho?c c s? d?ng ma ty tri php hay khng. Vi th? c th? t??ng tc v?i thu?c c?a qu v?. Ti c?n ph?i theo di ?i?u g trong khi dng thu?c ny? Hy ?i g?p bc s? ho?c chuyn vin y t? ?? theo di ??nh k? s? c?i thi?n c?a qu v?. Bc s? c th? yu c?u th? mu v lm cc xt nghi?m khc ?? theo di s? c?i thi?n c?a qu v?. Hy lin l?c v?i bc s? ho?c chuyn vin y t?, n?u qu v? b? c?m l?nh ho?c b? b?nh nhi?m trng khc trong khi dng thu?c ny. Khng ???c t? ?i?u tr? cho mnh. Thu?c ny c th? lm gi?m kh? n?ng ch?ng l?i cc b?nh nhi?m trng c?a c? th?. Qu v? c?n ph?i ch?c ch?n r?ng c? th? qu v? nh?n ???c ??y ?? l??ng calci v vitamin D trong th?i gian dng thu?c ny, tr? khi bc s? yu c?u qu v? ??ng lm nh? v?y. Hy th?o lu?n v?i bc s? ho?c chuyn vin y t? v? nh?ng th?c ph?m qu v? ?n v nh?ng vitamin qu v? dng. Hy ?i nha s? th??ng xuyn. ?nh r?ng ho?c x?a r?ng b?ng ch? nha khoa nh? ? ???c ch? d?n. N?u qu v? c ?i lm r?ng, th  hy bo v?i nha s? r?ng qu v? ?ang dng thu?c ny. Khng ???c c thai trong th?i gian dng thu?c ny ho?c trong vng 5 thng sau khi ng?ng dng thu?c. Ph? n? c?n ph?i thng bo cho bc s? c?a mnh, n?u mu?n c thai ho?c ngh? r?ng c th? mnh ? c thai. C nguy c? v? cc tc d?ng ph? nghim tr?ng ??i v?i thai nhi. Hy th?o lu?n v?i bc s? ho?c chuyn vin y t? ho?c d??c s? ?? bi?t thm thng tin. Ti c th? nh?n th?y nh?ng tc d?ng ph? no khi dng thu?c ny? Nh?ng tc d?ng ph? qu v? c?n ph?i bo cho bc s? ho?c chuyn vin y t? cng s?m cng t?t: -cc ph?n ?ng d? ?ng, ch?ng h?n nh? da b? m?n ??, ng?a, n?i my ?ay, s?ng ? m?t, mi, ho?c l??i -cc v?n ?? v? h h?p -?au ng?c -tim ??p nhanh ho?c khng ??u -c?m th?y chong vng, ng?t x?u, b? t -s?t, ?n l?nh, ho?c b?t k? d?u hi?u nhi?m trng no khc -co th?t, c?ng, ho?c gi?t c? b?p -t ho?c c?m gic nh? b? ki?n b -da b? r?p ho?c c u c?c, ho?c da b? kh, bong trc ho?c ?? -v?t th??ng lu lnh ho?c ?au khng r nguyn nhn ? mi?ng ho?c hm -b? b?m tm ho?c xu?t huy?t b?t th??ng Cc tc d?ng ph? khng c?n ph?i ch?m sc y t? (Hy bo cho bc s? ho?c chuyn vin y t? n?u cc tc d?ng ph? ny ti?p di?n ho?c gy phi?n toi): -?au c? b?p -kh ch?u bao t?, ??y h?i Danh sch ny c th? khng m t? ?? h?t cc tc d?ng ph? c th? x?y ra. Xin g?i t?i bc s? c?a mnh ?? ???c c? v?n chuyn mn v? cc tc d?ng ph?. Qu v? c th? t??ng trnh cc tc d?ng ph? cho FDA theo s? [phone]. Ti nn c?t gi? thu?c c?a mnh ? ?u? Thu?c ny ch? ???c dng trong phng m?ch, v?n phng bc s? ho?c c? s? y t? khc v s? khng ???c c?t gi?   t?i nh. L?U : ?y l b?n tm t?t. N c th? khng bao hm t?t c? thng tin c th? c. N?u qu v? th?c m?c v? thu?c ny, xin trao ??i v?i bc s?, d??c s?, ho?c ng??i cung c?p d?ch v? y t? c?a mnh.    2016, Elsevier/Gold Standard. (2012-07-05 00:00:00)  

## 2016-12-08 ENCOUNTER — Ambulatory Visit (INDEPENDENT_AMBULATORY_CARE_PROVIDER_SITE_OTHER): Payer: Commercial Managed Care - HMO | Admitting: Emergency Medicine

## 2016-12-08 VITALS — BP 146/100 | HR 80 | Temp 97.7°F | Ht <= 58 in | Wt 123.0 lb

## 2016-12-08 DIAGNOSIS — L309 Dermatitis, unspecified: Secondary | ICD-10-CM | POA: Diagnosis not present

## 2016-12-08 DIAGNOSIS — L299 Pruritus, unspecified: Secondary | ICD-10-CM

## 2016-12-08 MED ORDER — CLOBETASOL PROPIONATE 0.05 % EX CREA: 1.0000 "application " | TOPICAL_CREAM | Freq: Two times a day (BID) | CUTANEOUS | 0 refills | Status: DC

## 2016-12-08 MED ORDER — METHYLPREDNISOLONE ACETATE 80 MG/ML IJ SUSP
80.0000 mg | Freq: Once | INTRAMUSCULAR | Status: AC
  Administered 2016-12-08: 80 mg via INTRAMUSCULAR

## 2016-12-08 MED ORDER — CLOBETASOL PROPIONATE 0.05 % EX CREA: TOPICAL_CREAM | Freq: Two times a day (BID) | CUTANEOUS | Status: DC

## 2016-12-08 NOTE — Assessment & Plan Note (Signed)
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  Ibrance toxicities: 1. Neutropenia: Today's ANC is 0.6 Currently on 75 mg dose 2 weeks on followed by 2 weeks off Denies any hot flashes or myalgias related to letrozole.  Bone metastases: OnXgeva to q 4weeks. Patient currently takes calcium with vitamin D Hypertension: Instructed the patient to follow with her primary care physician CT chest abdomen pelvis 11/03/2016: Stable internal mamm LN, Mixed lesions in T spine and ribs unchanged No further pleural effusion. Excellent response to treatment. Radiology review: I discussed with the patient that this cancer showing stable disease which is excellent.  RTC in 8weeks for blood work check

## 2016-12-08 NOTE — Patient Instructions (Addendum)
IF you received an x-ray today, you will receive an invoice from [facility]. Please contact [facility] at [phone] with questions or concerns regarding your invoice.   IF you received labwork today, you will receive an invoice from [facility]. Please contact [facility] at [phone] with questions or concerns regarding your invoice.   Our billing staff will not be able to assist you with questions regarding bills from these companies.  You will be contacted with the lab results as soon as they are available. The fastest way to get your results is to activate your My Chart account. Instructions are located on the last page of this paperwork. If you have not heard from [place] regarding the results in 2 weeks, please contact this office.      Atopic Dermatitis Atopic dermatitis is a skin disorder that causes inflammation of the skin. This is the most common type of eczema. Eczema is a group of skin conditions that cause the skin to be itchy, red, and swollen. This condition is generally worse during the cooler winter months and often improves during the warm summer months. Symptoms can vary from person to person. Atopic dermatitis usually starts showing signs in infancy and can last through adulthood. This condition cannot be passed from one person to another (non-contagious), but is more common in families. Atopic dermatitis may not always be present. When it is present, it is called a flare-up. What are the causes? The exact cause of this condition is not known. Flare-ups of the condition may be triggered by:  Contact with something you are sensitive or allergic to.  Stress.  Certain foods.  Extremely hot or cold weather.  Harsh chemicals and soaps.  Dry air.  Chlorine. What increases the risk? This condition is more likely to develop in people who have a personal history or family history of eczema, allergies, asthma, or hay fever. What are the signs or  symptoms? Symptoms of this condition include:  Dry, scaly skin.  Red, itchy rash.  Itchiness, which can be severe. This may occur before the skin rash. This can make sleeping difficult.  Skin thickening and cracking can occur over time. How is this diagnosed? This condition is diagnosed based on your symptoms, a medical history, and a physical exam. How is this treated? There is no cure for this condition, but symptoms can usually be controlled. Treatment focuses on:  Controlling the itching and scratching. You may be given medicines, such as antihistamines or steroid creams.  Limiting exposure to things that you are sensitive or allergic to (allergens).  Recognizing situations that cause stress and developing a plan to manage stress. If your atopic dermatitis does not get better with medicines or is all over your body (widespread) , a treatment using a specific type of light (phototherapy) may be used. Follow these instructions at home: Skin care  Keep your skin well-moisturized. This seals in moisture and help prevent dryness.  Use unscented lotions that have petroleum in them.  Avoid lotions that contain alcohol and water. They can dry the skin.  Keep baths or showers short (less than 5 minutes) in warm water. Do not use hot water.  Use mild, unscented cleansers for bathing. Avoid soap and bubble bath.  Apply a moisturizer to your skin right after a bath or shower.   Do not apply anything to your skin without checking with your health care provider. General instructions  Dress in clothes made of cotton or cotton blends. Dress lightly  because heat increases itching.  When washing your clothes, rinse your clothes twice so all of the soap is removed.  Avoid any triggers that can cause a flare-up.  Try to manage your stress.  Keep your fingernails cut short.  Avoid scratching. Scratching makes the rash and itching worse. It may also result in a skin infection  (impetigo) due to a break in the skin caused by scratching.  Take or apply over-the-counter and prescription medicines only as told by your health care provider.  Keep all follow-up visits as told by your health care provider. This is important.  Do not be around people who have cold sores or fever blisters. If you get the infection, it may cause your atopic dermatitis to worsen. Contact a health care provider if:  Your itching interferes with sleep.  Your rash gets worse or is not better within one week of starting treatment.  You have a fever.  You have a rash flare-up after having contact with someone who has cold sores or fever blisters. Get help right away if:  You develop pus or soft yellow scabs in the rash area. Summary  This condition causes a red rash and itchy, dry, scaly skin.  Treatment focuses on controlling the itching and scratching, limiting exposure to things that you are sensitive or allergic to (allergens), and recognizing situations that cause stress and developing a plan to manage stress.  Keep your skin well-moisturized.  Keep baths or showers less than 5 minutes. This information is not intended to replace advice given to you by your health care provider. Make sure you discuss any questions you have with your health care provider. Document Released: 11/18/2000 Document Revised: 04/28/2016 Document Reviewed: 06/24/2013 Elsevier Interactive Patient Education  2017 [employer].

## 2016-12-08 NOTE — Addendum Note (Signed)
Addended by: Davina Poke on: 12/08/2016 04:42 PM   Modules accepted: Orders

## 2016-12-08 NOTE — Progress Notes (Signed)
Jessica Zavala 71 y.o.   Chief Complaint  Patient presents with  . Hand Pain    pain, itching     HISTORY OF PRESENT ILLNESS: This is a 71 y.o. female complaining of itchy rash of both hands.  HPI   Prior to Admission medications   Medication Sig Start Date End Date Taking? Authorizing Provider  acetaminophen (TYLENOL) 325 MG tablet Take 650 mg by mouth every 6 (six) hours as needed for mild pain.   Yes Historical Provider, MD  labetalol (NORMODYNE) 200 MG tablet Take 200 mg by mouth 2 (two) times daily. 11/26/15  Yes Historical Provider, MD  letrozole (FEMARA) 2.5 MG tablet Take 1 tablet (2.5 mg total) by mouth daily. 02/18/16  Yes [name], MD  losartan (COZAAR) 100 MG tablet  02/01/16  Yes Historical Provider, MD  Omega-3 Fatty Acids (FISH OIL PO) Take 1 capsule by mouth daily.   Yes Historical Provider, MD  palbociclib Leslee Home) 75 MG capsule Take 1 capsule (75 mg total) by mouth daily with breakfast. 2 weeks on 2 weeks off 11/04/16  Yes [name], MD  simvastatin (ZOCOR) 40 MG tablet Take 40 mg by mouth daily. Reported on 06/24/2016   Yes Historical Provider, MD  cromolyn (OPTICROM) 4 % ophthalmic solution Place 1 drop into the right eye 4 (four) times daily. Reported on 06/24/2016 12/18/15   Historical Provider, MD  fluorometholone (FML) 0.1 % ophthalmic suspension Reported on 06/24/2016 12/21/15   Historical Provider, MD  Menthol, Topical Analgesic, (BIOFREEZE COLORLESS) 4 % GEL Use in affected area TID prn Patient not taking: Reported on 12/08/2016 10/08/15   Thao P Le, DO  olmesartan (BENICAR) 40 MG tablet Take 40 mg by mouth daily. Reported on 06/24/2016    Historical Provider, MD  phenazopyridine (PYRIDIUM) 200 MG tablet Take 1 tablet (200 mg total) by mouth 3 (three) times daily. Patient not taking: Reported on 12/08/2016 12/20/15   Veryl Speak, MD  triamcinolone (KENALOG) 0.1 % paste Use as directed 1 application in the mouth or throat 2 (two) times daily. Patient not taking:  Reported on 12/08/2016 06/24/16   Dorian Heckle English, PA    Allergies  Allergen Reactions  . Aspirin Swelling    Mouth and facial swelling    Patient Active Problem List   Diagnosis Date Noted  . Bone metastases ([place]) 02/02/2016  . Breast cancer of lower-outer quadrant of right female breast ([place]) 01/05/2016  . Eczema of hand 11/15/2013  . Hypercholesterolemia 06/14/2012  . Hypertension 01/06/2012  . Eczema 01/06/2012    Past Medical History:  Diagnosis Date  . Allergy   . Cancer ([place])    Right side, sp lumpectomy, rxt and chemo  . High cholesterol   . Hyperlipidemia   . Hypertension     Past Surgical History:  Procedure Laterality Date  . BREAST SURGERY      Social History   Social History  . Marital status: Married    Spouse name: N/A  . Number of children: N/A  . Years of education: N/A   Occupational History  . Not on file.   Social History Main Topics  . Smoking status: Never Smoker  . Smokeless tobacco: Never Used  . Alcohol use No  . Drug use: No  . Sexual activity: Yes    Birth control/ protection: Post-menopausal   Other Topics Concern  . Not on file   Social History Narrative  . No narrative on file    History reviewed. No pertinent  family history.   Review of Systems  Constitutional: Negative.   HENT: Negative for congestion and nosebleeds.   Respiratory: Negative for cough and shortness of breath.   Cardiovascular: Negative for chest pain and palpitations.  Gastrointestinal: Negative for nausea and vomiting.  Musculoskeletal: Negative for myalgias.  Skin: Positive for itching and rash (both hands).  Endo/Heme/Allergies: Does not bruise/bleed easily.   Vitals:   12/08/16 1551  BP: (!) 146/100  Pulse: 80  Temp: 97.7 F (36.5 C)     Physical Exam  Constitutional: She is oriented to person, place, and time. She appears well-developed and well-nourished.  HENT:  Head: Normocephalic and atraumatic.  Eyes: EOM are normal. Pupils  are equal, round, and reactive to light.  Neck: Normal range of motion.  Cardiovascular: Normal rate.   Pulmonary/Chest: Effort normal.  Musculoskeletal: Normal range of motion.  Neurological: She is alert and oriented to person, place, and time.  Skin: Skin is warm and dry.  Hands: eczematous rash to both hands; no secondary cellulitis.  Vitals reviewed.    ASSESSMENT & PLAN: Antonise was seen today for hand pain.  Diagnoses and all orders for this visit:  Eczema, unspecified type -     methylPREDNISolone acetate (DEPO-MEDROL) injection 80 mg; Inject 1 mL (80 mg total) into the muscle once. -     clobetasol cream (TEMOVATE) 0.05 %; Apply topically 2 (two) times daily.  Itching    Patient Instructions       IF you received an x-ray today, you will receive an invoice from [facility]. Please contact [facility] at [phone] with questions or concerns regarding your invoice.   IF you received labwork today, you will receive an invoice from [place]. Please contact [facility] at [phone] with questions or concerns regarding your invoice.   Our billing staff will not be able to assist you with questions regarding bills from these companies.  You will be contacted with the lab results as soon as they are available. The fastest way to get your results is to activate your My Chart account. Instructions are located on the last page of this paperwork. If you have not heard from [place] regarding the results in 2 weeks, please contact this office.      Atopic Dermatitis Atopic dermatitis is a skin disorder that causes inflammation of the skin. This is the most common type of eczema. Eczema is a group of skin conditions that cause the skin to be itchy, red, and swollen. This condition is generally worse during the cooler winter months and often improves during the warm summer months. Symptoms can vary from person to person. Atopic dermatitis usually starts showing signs  in infancy and can last through adulthood. This condition cannot be passed from one person to another (non-contagious), but is more common in families. Atopic dermatitis may not always be present. When it is present, it is called a flare-up. What are the causes? The exact cause of this condition is not known. Flare-ups of the condition may be triggered by:  Contact with something you are sensitive or allergic to.  Stress.  Certain foods.  Extremely hot or cold weather.  Harsh chemicals and soaps.  Dry air.  Chlorine. What increases the risk? This condition is more likely to develop in people who have a personal history or family history of eczema, allergies, asthma, or hay fever. What are the signs or symptoms? Symptoms of this condition include:  Dry, scaly skin.  Red, itchy rash.  Itchiness, which  can be severe. This may occur before the skin rash. This can make sleeping difficult.  Skin thickening and cracking can occur over time. How is this diagnosed? This condition is diagnosed based on your symptoms, a medical history, and a physical exam. How is this treated? There is no cure for this condition, but symptoms can usually be controlled. Treatment focuses on:  Controlling the itching and scratching. You may be given medicines, such as antihistamines or steroid creams.  Limiting exposure to things that you are sensitive or allergic to (allergens).  Recognizing situations that cause stress and developing a plan to manage stress. If your atopic dermatitis does not get better with medicines or is all over your body (widespread) , a treatment using a specific type of light (phototherapy) may be used. Follow these instructions at home: Skin care  Keep your skin well-moisturized. This seals in moisture and help prevent dryness.  Use unscented lotions that have petroleum in them.  Avoid lotions that contain alcohol and water. They can dry the skin.  Keep baths or showers  short (less than 5 minutes) in warm water. Do not use hot water.  Use mild, unscented cleansers for bathing. Avoid soap and bubble bath.  Apply a moisturizer to your skin right after a bath or shower.   Do not apply anything to your skin without checking with your health care provider. General instructions  Dress in clothes made of cotton or cotton blends. Dress lightly because heat increases itching.  When washing your clothes, rinse your clothes twice so all of the soap is removed.  Avoid any triggers that can cause a flare-up.  Try to manage your stress.  Keep your fingernails cut short.  Avoid scratching. Scratching makes the rash and itching worse. It may also result in a skin infection (impetigo) due to a break in the skin caused by scratching.  Take or apply over-the-counter and prescription medicines only as told by your health care provider.  Keep all follow-up visits as told by your health care provider. This is important.  Do not be around people who have cold sores or fever blisters. If you get the infection, it may cause your atopic dermatitis to worsen. Contact a health care provider if:  Your itching interferes with sleep.  Your rash gets worse or is not better within one week of starting treatment.  You have a fever.  You have a rash flare-up after having contact with someone who has cold sores or fever blisters. Get help right away if:  You develop pus or soft yellow scabs in the rash area. Summary  This condition causes a red rash and itchy, dry, scaly skin.  Treatment focuses on controlling the itching and scratching, limiting exposure to things that you are sensitive or allergic to (allergens), and recognizing situations that cause stress and developing a plan to manage stress.  Keep your skin well-moisturized.  Keep baths or showers less than 5 minutes. This information is not intended to replace advice given to you by your health care provider.  Make sure you discuss any questions you have with your health care provider. Document Released: 11/18/2000 Document Revised: 04/28/2016 Document Reviewed: 06/24/2013 Elsevier Interactive Patient Education  2017 Elsevier Inc.      Agustina Caroli, MD [facility]

## 2016-12-09 ENCOUNTER — Ambulatory Visit (HOSPITAL_BASED_OUTPATIENT_CLINIC_OR_DEPARTMENT_OTHER): Payer: Medicare HMO

## 2016-12-09 ENCOUNTER — Other Ambulatory Visit (HOSPITAL_BASED_OUTPATIENT_CLINIC_OR_DEPARTMENT_OTHER): Payer: Medicare HMO

## 2016-12-09 ENCOUNTER — Encounter: Payer: Self-pay | Admitting: Hematology and Oncology

## 2016-12-09 ENCOUNTER — Ambulatory Visit (HOSPITAL_BASED_OUTPATIENT_CLINIC_OR_DEPARTMENT_OTHER): Payer: Medicare HMO | Admitting: Hematology and Oncology

## 2016-12-09 VITALS — BP 162/76 | HR 70 | Temp 97.6°F | Resp 17 | Ht <= 58 in | Wt 124.3 lb

## 2016-12-09 VITALS — BP 155/68 | HR 78 | Temp 97.8°F | Resp 20

## 2016-12-09 DIAGNOSIS — C7951 Secondary malignant neoplasm of bone: Secondary | ICD-10-CM

## 2016-12-09 DIAGNOSIS — C50511 Malignant neoplasm of lower-outer quadrant of right female breast: Secondary | ICD-10-CM

## 2016-12-09 DIAGNOSIS — Z170 Estrogen receptor positive status [ER+]: Principal | ICD-10-CM

## 2016-12-09 DIAGNOSIS — D701 Agranulocytosis secondary to cancer chemotherapy: Secondary | ICD-10-CM

## 2016-12-09 DIAGNOSIS — I517 Cardiomegaly: Secondary | ICD-10-CM

## 2016-12-09 LAB — COMPREHENSIVE METABOLIC PANEL
ALT: 16 U/L (ref 0–55)
AST: 23 U/L (ref 5–34)
Albumin: 4 g/dL (ref 3.5–5.0)
Alkaline Phosphatase: 47 U/L (ref 40–150)
Anion Gap: 9 mEq/L (ref 3–11)
BUN: 21.8 mg/dL (ref 7.0–26.0)
CALCIUM: 8.9 mg/dL (ref 8.4–10.4)
CHLORIDE: 96 meq/L — AB (ref 98–109)
CO2: 26 meq/L (ref 22–29)
CREATININE: 0.7 mg/dL (ref 0.6–1.1)
EGFR: 81 mL/min/{1.73_m2} — ABNORMAL LOW (ref >90)
GLUCOSE: 99 mg/dL (ref 70–140)
Potassium: 4.6 mEq/L (ref 3.5–5.1)
SODIUM: 132 meq/L — AB (ref 136–145)
Total Bilirubin: 0.52 mg/dL (ref 0.20–1.20)
Total Protein: 7.6 g/dL (ref 6.4–8.3)

## 2016-12-09 LAB — CBC WITH DIFFERENTIAL/PLATELET
BASO%: 1 % (ref 0.0–2.0)
Basophils Absolute: 0.1 10*3/uL (ref 0.0–0.1)
EOS%: 1.8 % (ref 0.0–7.0)
Eosinophils Absolute: 0.1 10*3/uL (ref 0.0–0.5)
HCT: 34.9 % (ref 34.8–46.6)
HGB: 11.9 g/dL (ref 11.6–15.9)
LYMPH%: 29.4 % (ref 14.0–49.7)
MCH: 34.7 pg — ABNORMAL HIGH (ref 25.1–34.0)
MCHC: 34.2 g/dL (ref 31.5–36.0)
MCV: 101.7 fL — ABNORMAL HIGH (ref 79.5–101.0)
MONO#: 0.4 10*3/uL (ref 0.1–0.9)
MONO%: 8.3 % (ref 0.0–14.0)
NEUT#: 3.2 10*3/uL (ref 1.5–6.5)
NEUT%: 59.5 % (ref 38.4–76.8)
Platelets: 298 10*3/uL (ref 145–400)
RBC: 3.43 10*6/uL — ABNORMAL LOW (ref 3.70–5.45)
RDW: 16.4 % — ABNORMAL HIGH (ref 11.2–14.5)
WBC: 5.3 10*3/uL (ref 3.9–10.3)
lymph#: 1.6 10*3/uL (ref 0.9–3.3)

## 2016-12-09 MED ORDER — PALBOCICLIB 75 MG PO CAPS
75.0000 mg | ORAL_CAPSULE | Freq: Every day | ORAL | 3 refills | Status: DC
Start: 2016-12-09 — End: 2017-04-23

## 2016-12-09 MED ORDER — DENOSUMAB 120 MG/1.7ML ~~LOC~~ SOLN
120.0000 mg | Freq: Once | SUBCUTANEOUS | Status: AC
  Administered 2016-12-09: 120 mg via SUBCUTANEOUS
  Filled 2016-12-09: qty 1.7

## 2016-12-09 NOTE — Progress Notes (Signed)
Patient Care Team: [name], MD as PCP - General (Internal Medicine)  DIAGNOSIS:  Encounter Diagnoses  Name Primary?  . Malignant neoplasm of lower-outer quadrant of right breast of female, estrogen receptor positive ([place])   . Cardiomegaly Yes    SUMMARY OF ONCOLOGIC HISTORY:   Breast cancer of lower-outer quadrant of right female breast ([place])   01/04/1995 Surgery    Right breast cancer status post lumpectomy followed by chemotherapy followed by radiation and tamoxifen 5 years (details are not available)      12/30/2015 Relapse/Recurrence    Chest wall/ soft tissue masses near the right side of the sternum, superior nodule measuring 14.5 mm, inferior nodule measuring 10.5 mm, 26 mm anterior mediastinal soft tissue mass, T9 bone metastases      12/30/2015 Procedure    Thoracentesis: 400 mL removed; showed reactive mesothelial cells, no cancer cells      12/30/2015 Imaging    CT chest: 26 x 17 mm anterior mediastinal soft tissue mass, mixed lytic and sclerotic bone mets mainly involving sternum and T9 vertebral body, scattered vertebral body involvement and rib involvement; chest wall soft tissue masses 14.5 mm, 10.5 mm      01/13/2016 Initial Biopsy    Right chest wall mass biopsy 2:30 position: Invasive ductal carcinoma focally involves skeletal muscle, grade 2, HER-2 negative ratio 1.08, ER/PR 95% positive      01/14/2016 PET scan    Widespread metastatic disease right breast, right chest wall, mediastinum, right internal mammary lymph nodes, right pleural space, malignant pleural effusion, right common iliac lymph node, bone metastases in the thorax      01/21/2016 -  Anti-estrogen oral therapy    Ibrance with letrozole       CHIEF COMPLIANT: Follow-up on Ibrance with letrozole  INTERVAL HISTORY: Jessica Zavala is a 71 year old with above-mentioned history of metastatic breast cancer currently on Ibrance with letrozole. She is tolerating the treatment extremely well. The  only issue was lowering of blood counts. We have been adjusting her dosage of Ibrance. We'll decrease it to 2 weeks on 2 weeks off. This is because 3 weeks on 2 weeks off was not keeping her blood counts stable. She has had some fatigue related to the treatment. Complaining of right-sided chest wall discomfort since yesterday  REVIEW OF SYSTEMS:   Constitutional: Denies fevers, chills or abnormal weight loss, mild fatigue Eyes: Denies blurriness of vision Ears, nose, mouth, throat, and face: Denies mucositis or sore throat Respiratory: Denies cough, dyspnea or wheezes Cardiovascular: Denies palpitation, chest discomfort Gastrointestinal:  Denies nausea, heartburn or change in bowel habits Skin: Denies abnormal skin rashes Lymphatics: Denies new lymphadenopathy or easy bruising Neurological:Denies numbness, tingling or new weaknesses Behavioral/Psych: Mood is stable, no new changes  Extremities: No lower extremity edema Breast:  denies any pain or lumps or nodules in either breasts All other systems were reviewed with the patient and are negative.  I have reviewed the past medical history, past surgical history, social history and family history with the patient and they are unchanged from previous note.  ALLERGIES:  is allergic to aspirin.  MEDICATIONS:  Current Outpatient Prescriptions  Medication Sig Dispense Refill  . acetaminophen (TYLENOL) 325 MG tablet Take 650 mg by mouth every 6 (six) hours as needed for mild pain.    . clobetasol cream (TEMOVATE) 4.62 % Apply 1 application topically 2 (two) times daily. 30 g 0  . cromolyn (OPTICROM) 4 % ophthalmic solution Place 1 drop into the right  eye 4 (four) times daily. Reported on 06/24/2016    . fluorometholone (FML) 0.1 % ophthalmic suspension Reported on 06/24/2016    . labetalol (NORMODYNE) 200 MG tablet Take 200 mg by mouth 2 (two) times daily.    Marland Kitchen letrozole (FEMARA) 2.5 MG tablet Take 1 tablet (2.5 mg total) by mouth daily. 90 tablet  3  . losartan (COZAAR) 100 MG tablet     . Menthol, Topical Analgesic, (BIOFREEZE COLORLESS) 4 % GEL Use in affected area TID prn (Patient not taking: Reported on 12/08/2016) 100 mL 0  . olmesartan (BENICAR) 40 MG tablet Take 40 mg by mouth daily. Reported on 06/24/2016    . Omega-3 Fatty Acids (FISH OIL PO) Take 1 capsule by mouth daily.    . palbociclib (IBRANCE) 75 MG capsule Take 1 capsule (75 mg total) by mouth daily with breakfast. 2 weeks on 2 weeks off 14 capsule 3  . phenazopyridine (PYRIDIUM) 200 MG tablet Take 1 tablet (200 mg total) by mouth 3 (three) times daily. (Patient not taking: Reported on 12/08/2016) 6 tablet 0  . simvastatin (ZOCOR) 40 MG tablet Take 40 mg by mouth daily. Reported on 06/24/2016    . triamcinolone (KENALOG) 0.1 % paste Use as directed 1 application in the mouth or throat 2 (two) times daily. (Patient not taking: Reported on 12/08/2016) 5 g 0   No current facility-administered medications for this visit.     PHYSICAL EXAMINATION: ECOG PERFORMANCE STATUS: 1 - Symptomatic but completely ambulatory  Vitals:   12/09/16 0828  BP: (!) 162/76  Pulse: 70  Resp: 17  Temp: 97.6 F (36.4 C)   Filed Weights   12/09/16 0828  Weight: 124 lb 4.8 oz (56.4 kg)    GENERAL:alert, no distress and comfortable SKIN: skin color, texture, turgor are normal, no rashes or significant lesions EYES: normal, Conjunctiva are pink and non-injected, sclera clear OROPHARYNX:no exudate, no erythema and lips, buccal mucosa, and tongue normal  NECK: supple, thyroid normal size, non-tender, without nodularity LYMPH:  no palpable lymphadenopathy in the cervical, axillary or inguinal LUNGS: clear to auscultation and percussion with normal breathing effort HEART: regular rate & rhythm and no murmurs and no lower extremity edema ABDOMEN:abdomen soft, non-tender and normal bowel sounds MUSCULOSKELETAL:no cyanosis of digits and no clubbing  NEURO: alert & oriented x 3 with fluent speech, no  focal motor/sensory deficits EXTREMITIES: No lower extremity edema LABORATORY DATA:  I have reviewed the data as listed   Chemistry      Component Value Date/Time   NA 135 (L) 11/04/2016 0813   K 5.0 11/04/2016 0813   CL 102 12/20/2015 0509   CO2 29 11/04/2016 0813   BUN 15.2 11/04/2016 0813   CREATININE 0.8 11/04/2016 0813      Component Value Date/Time   CALCIUM 9.6 11/04/2016 0813   ALKPHOS 40 11/04/2016 0813   AST 21 11/04/2016 0813   ALT 13 11/04/2016 0813   BILITOT 0.46 11/04/2016 0813       Lab Results  Component Value Date   WBC 5.3 12/09/2016   HGB 11.9 12/09/2016   HCT 34.9 12/09/2016   MCV 101.7 (H) 12/09/2016   PLT 298 12/09/2016   NEUTROABS 3.2 12/09/2016    ASSESSMENT & PLAN:  Breast cancer of lower-outer quadrant of right female breast ([place]) Metastatic breast cancer PET-CT 01/14/16: Widespread metastatic disease right breast, right chest wall several nodules largest 3.7 cm, mediastinum, right internal mammary lymph node 2.2 cm, right pleural-based nodules 2.1cm and 2.7  cm, malignant pleural effusion, right common iliac lymph node, bone metastases in the thorax (sternum, manubrium, multiple thoracic vertebral bodies, multiple right-sided ribs)  Right chest wall mass biopsy 2:30 position 01/13/2016: Invasive ductal carcinoma focally involves skeletal muscle, grade 2, HER-2 negative ratio 1.08, ER/PR pending (Right breast cancer 1996 status post lumpectomy followed by chemotherapy followed by radiation and tamoxifen 5 years (details are not available)) ---------------------------------------------------------------------------------------------------------------------------------------------------------- Treatment plan: Ibrance with letrozole started 01/20/2016 , cycle 3 Ibrance 100 mg, Cycle 4 Ibrance 75 mg dose; switch to 75 mg 3 weeks on 2 weeks of from 07/22/2016, changed to 2 weeks on 2 weeks off 11/04/2016.  Ibrance toxicities: 1. Neutropenia: Today's  ANC is 0.6 Currently on 75 mg dose 2 weeks on followed by 2 weeks off Denies any hot flashes or myalgias related to letrozole.  Bone metastases: OnXgeva to q 4weeks. Patient currently takes calcium with vitamin D Hypertension: Instructed the patient to follow with her primary care physician CT chest abdomen pelvis 11/03/2016: Stable internal mamm LN, Mixed lesions in T spine and ribs unchanged No further pleural effusion. Excellent response to treatment. Radiology review: I discussed with the patient that this cancer showing stable disease which is excellent.  CT scan showed cardiomegaly with atherosclerosis and faint lung infiltrates in November 2017: I will refer her to see cardiology She will call monthly for labs and Xgeva RTC in 8weeks for blood work check.    Orders Placed This Encounter  Procedures  . Ambulatory referral to Cardiology    Referral Priority:   Routine    Referral Type:   Consultation    Referral Reason:   Specialty Services Required    Referred to Provider:   Jolaine Artist, MD    Requested Specialty:   Cardiology    Number of Visits Requested:   1   The patient has a good understanding of the overall plan. she agrees with it. she will call with any problems that may develop before the next visit here.   Rulon Eisenmenger, MD 12/09/16

## 2016-12-09 NOTE — Patient Instructions (Signed)
Denosumab injection ?y l thu?c g? DENOSUMAB lm ch?m s? suy y?u x??ng. Prolia ???c dng ?? ?i?u tr? ch?ng long x??ng ? ph? n? sau khi mn kinh v ? nam gi?i. Xgeva ???c dng ?? phng ng?a gy x??ng v cc v?n ?? khc v? x??ng gy ra b?i di c?n x??ng do ung th?. Xgeva c?ng ???c dng ?? ?i?u tr? b??u t? bo kh?ng l? (giant cell tumor) c?a x??ng. Thu?c ny c th? ???c dng cho nh?ng m?c ?ch khc; hy h?i ng??i cung c?p d?ch v? y t? ho?c d??c s? c?a mnh, n?u qu v? c th?c m?c. Ti c?n ph?i bo cho ng??i cung c?p d?ch v? y t? c?a mnh ?i?u g tr??c khi dng thu?c ny? H? c?n bi?t li?u qu v? c b?t k? tnh tr?ng no sau ?y khng: -b?nh v? r?ng -chm da -nhi?m trng ho?c ti?n s? nhi?m trng -b?nh th?n ho?c n?u qu v? ?ang ???c th?m phn -m?c calci ho?c vitamin D trong mu th?p -h?i ch?ng r?i lo?n h?p th? -???c x?p vo l?ch ?? gi?i ph?u ho?c nh? r?ng -?ang du?ng thu?c co? ch??a denosumab -b?nh tuy?n gip tr?ng ho?c tuy?n c?n gip tr?ng -ph?n ?ng b?t th??ng v?i denosumab -pha?n ??ng b?t th???ng ho??c di? ??ng v??i ca?c d??c ph?m kha?c -pha?n ??ng b?t th???ng ho??c di? ??ng v??i th??c ph?m, thu?c nhu?m, ho??c ch?t ba?o qua?n -?ang c thai ho??c ??nh co? thai -?ang cho con bu? Ti nn s? d?ng thu?c ny nh? th? no? Thu?c ny ?? tim d??i da. Thu?c ny ???c s? d?ng b?i chuyn vin y t? ? b?nh vi?n ho?c ? phng m?ch. N?u qu v? s?p dng thu?c Prolia, d??c s? s? ??a cho qu v? m?t B?n H??ng D?n v? D??c Ph?m (MedGuide) ??c bi?t cho m?i toa thu?c v cho m?i l?n mua thm thu?c ?. Hy b?o ??m ??c k? thng tin ny m?i l?n. ??i v?i Prolia, hy bn v?i bc s? nhi khoa c?a qu v? v? vi?c dng thu?c ny ? tr? em. C th? c?n ch?m sc ??c bi?t. ??i v?i Xgeva, hy bn v?i bc s? nhi khoa c?a qu v? v? vi?c dng thu?c ny ? tr? em. Thu?c ny c th? ???c k toa cho tr? em ch? m?i 13 tu?i trong nh?ng tr??ng h?p ch?n l?c, nh?ng c?n ph?i th?n tr?ng. Qu li?u: N?u qu v? cho r?ng mnh ? dng qu nhi?u  thu?c ny, th hy lin l?c v?i trung tm ki?m sot ch?t ??c ho?c phng c?p c?u ngay l?p t?c. L?U : Thu?c ny ch? dnh ring cho qu v?. Khng chia s? thu?c ny v?i nh?ng ng??i khc. N?u ti l? qun m?t li?u th sao? ?i?u quan tr?ng l khng nn b? l? li?u thu?c no. Hy lin l?c v?i bc s? ho?c chuyn vin y t? c?a mnh, n?u qu v? khng th? gi? ?ng cu?c h?n khm. Nh?ng g c th? t??ng tc v?i thu?c ny? Khng ???c dng thu?c ny cng v?i b?t k? th? no sau ?y: -nh??ng thu?c kha?c co? ch??a denosumab Thu?c ny c?ng c th? t??ng tc v?i cc thu?c sau ?y: -cc thu?c ?c ch? h? mi?n d?ch -cc thu?c ?i?u tr? b?nh ung th? -cc thu?c steroid, ch?ng h?n nh? prednisone ho?c cortisone Danh sch ny c th? khng m t? ?? h?t cc t??ng tc c th? x?y ra. Hy ??a cho ng??i cung c?p d?ch v? y t? c?a mnh danh sch t?t c? cc thu?c, th?o d??c, cc thu?c khng c?n toa, ho?c cc   ch? ph?m b? sung m qu v? dng. C?ng nn bo cho h? bi?t r?ng qu v? c ht thu?c, u?ng r??u, ho?c c s? d?ng ma ty tri php hay khng. Vi th? c th? t??ng tc v?i thu?c c?a qu v?. Ti c?n ph?i theo di ?i?u g trong khi dng thu?c ny? Hy ?i g?p bc s? ho?c chuyn vin y t? ?? theo di ??nh k? s? c?i thi?n c?a qu v?. Bc s? c th? yu c?u th? mu v lm cc xt nghi?m khc ?? theo di s? c?i thi?n c?a qu v?. Hy lin l?c v?i bc s? ho?c chuyn vin y t?, n?u qu v? b? c?m l?nh ho?c b? b?nh nhi?m trng khc trong khi dng thu?c ny. Khng ???c t? ?i?u tr? cho mnh. Thu?c ny c th? lm gi?m kh? n?ng ch?ng l?i cc b?nh nhi?m trng c?a c? th?. Qu v? c?n ph?i ch?c ch?n r?ng c? th? qu v? nh?n ???c ??y ?? l??ng calci v vitamin D trong th?i gian dng thu?c ny, tr? khi bc s? yu c?u qu v? ??ng lm nh? v?y. Hy th?o lu?n v?i bc s? ho?c chuyn vin y t? v? nh?ng th?c ph?m qu v? ?n v nh?ng vitamin qu v? dng. Hy ?i nha s? th??ng xuyn. ?nh r?ng ho?c x?a r?ng b?ng ch? nha khoa nh? ? ???c ch? d?n. N?u qu v? c ?i lm r?ng, th  hy bo v?i nha s? r?ng qu v? ?ang dng thu?c ny. Khng ???c c thai trong th?i gian dng thu?c ny ho?c trong vng 5 thng sau khi ng?ng dng thu?c. Ph? n? c?n ph?i thng bo cho bc s? c?a mnh, n?u mu?n c thai ho?c ngh? r?ng c th? mnh ? c thai. C nguy c? v? cc tc d?ng ph? nghim tr?ng ??i v?i thai nhi. Hy th?o lu?n v?i bc s? ho?c chuyn vin y t? ho?c d??c s? ?? bi?t thm thng tin. Ti c th? nh?n th?y nh?ng tc d?ng ph? no khi dng thu?c ny? Nh?ng tc d?ng ph? qu v? c?n ph?i bo cho bc s? ho?c chuyn vin y t? cng s?m cng t?t: -cc ph?n ?ng d? ?ng, ch?ng h?n nh? da b? m?n ??, ng?a, n?i my ?ay, s?ng ? m?t, mi, ho?c l??i -cc v?n ?? v? h h?p -?au ng?c -tim ??p nhanh ho?c khng ??u -c?m th?y chong vng, ng?t x?u, b? t -s?t, ?n l?nh, ho?c b?t k? d?u hi?u nhi?m trng no khc -co th?t, c?ng, ho?c gi?t c? b?p -t ho?c c?m gic nh? b? ki?n b -da b? r?p ho?c c u c?c, ho?c da b? kh, bong trc ho?c ?? -v?t th??ng lu lnh ho?c ?au khng r nguyn nhn ? mi?ng ho?c hm -b? b?m tm ho?c xu?t huy?t b?t th??ng Cc tc d?ng ph? khng c?n ph?i ch?m sc y t? (Hy bo cho bc s? ho?c chuyn vin y t? n?u cc tc d?ng ph? ny ti?p di?n ho?c gy phi?n toi): -?au c? b?p -kh ch?u bao t?, ??y h?i Danh sch ny c th? khng m t? ?? h?t cc tc d?ng ph? c th? x?y ra. Xin g?i t?i bc s? c?a mnh ?? ???c c? v?n chuyn mn v? cc tc d?ng ph?. Qu v? c th? t??ng trnh cc tc d?ng ph? cho FDA theo s? [phone]. Ti nn c?t gi? thu?c c?a mnh ? ?u? Thu?c ny ch? ???c dng trong phng m?ch, v?n phng bc s? ho?c c? s? y t? khc v s? khng ???c c?t gi?   t?i nh. L?U : ?y l b?n tm t?t. N c th? khng bao hm t?t c? thng tin c th? c. N?u qu v? th?c m?c v? thu?c ny, xin trao ??i v?i bc s?, d??c s?, ho?c ng??i cung c?p d?ch v? y t? c?a mnh.    2016, Elsevier/Gold Standard. (2012-07-05 00:00:00)  

## 2016-12-14 ENCOUNTER — Encounter: Payer: Self-pay | Admitting: *Deleted

## 2016-12-14 ENCOUNTER — Telehealth: Payer: Self-pay | Admitting: Pharmacist

## 2016-12-14 ENCOUNTER — Encounter: Payer: Self-pay | Admitting: Hematology and Oncology

## 2016-12-14 MED FILL — IBRANCE 75 MG CAPSULE: 75 | 30 days supply | Qty: 21 | Fill #3

## 2016-12-14 NOTE — Telephone Encounter (Signed)
Oral Chemotherapy Pharmacist Encounter  Received notification from [place] that patient had exhausted current copayment grant and was not able to afford next fill of Ibrance.  I successfully enrolled patient for foundation grant from patient [employer] [facility]). Amount: $[id number] Billing ID: [id number] BIN: [id number] Group: [id number] PCN: [id number] Dates: 09/15/16 - 12/13/17  I will forward this information to [facility] and instruct them to fill prescription and call patient to pick up when ready. I called and left patient's son [name] a voicemail that pharmacy will call when Leslee Home is ready.  Patient should be eligible for PAF funds again near the end of February is more funds are needed.  Oral Oncology Clinic will sign-off at this time. Please let us know if we can be of assistance in the future.  Johny Drilling, PharmD, BCPS, BCOP 12/14/2016  9:55 AM Oral Oncology Clinic [phone]

## 2016-12-28 ENCOUNTER — Telehealth: Payer: Self-pay

## 2016-12-28 NOTE — Telephone Encounter (Signed)
Pt son called to inform nurse that he is trying to schedule appt with Dr.Bensimon and was told that Dr.Gudena's nurse needs to reach out to [employer] first. Pt son also wanting to know more information about re enrollment for pt assistance on pt Ibrance medication for this year. Told son that I will take care of those concerns today and will call him back.  Calledpt  son back with VM and  informed him that a message to Kevan Rosebush was sent to get pt in with Dr.Benimhon regarding Cadiomegaly. Told son that someone should be reaching out to pt to set up appt. Also, informed pt regarding coverage for Ibrance medication. Told son that per Denyse Amass [facility]), noted that enrollment for patient assistance was successful and complete. Pt should be able to pick up her prescription at [place]. Provided son phone number for [facility] and call back number for any other questions.

## 2016-12-30 ENCOUNTER — Telehealth (HOSPITAL_COMMUNITY): Payer: Self-pay | Admitting: Internal Medicine

## 2016-12-30 DIAGNOSIS — C50511 Malignant neoplasm of lower-outer quadrant of right female breast: Secondary | ICD-10-CM

## 2016-12-30 NOTE — Telephone Encounter (Signed)
I called and left a message for pt's son, [name] [facility]) to return my call.  I have set up patient's appt for Echo and with Dr. Haroldine Laws.

## 2017-01-02 NOTE — Telephone Encounter (Signed)
I have her scheduled on 02/14/17 for both appts and I have spoken with her son, he is aware of her appt. New Pt packet mailed to her.

## 2017-01-06 ENCOUNTER — Ambulatory Visit (HOSPITAL_BASED_OUTPATIENT_CLINIC_OR_DEPARTMENT_OTHER): Payer: Medicare HMO

## 2017-01-06 ENCOUNTER — Other Ambulatory Visit (HOSPITAL_BASED_OUTPATIENT_CLINIC_OR_DEPARTMENT_OTHER): Payer: Medicare HMO

## 2017-01-06 ENCOUNTER — Ambulatory Visit: Payer: Commercial Managed Care - HMO | Admitting: Hematology and Oncology

## 2017-01-06 VITALS — BP 106/58 | HR 75 | Temp 98.3°F | Resp 18

## 2017-01-06 DIAGNOSIS — C7951 Secondary malignant neoplasm of bone: Secondary | ICD-10-CM | POA: Diagnosis not present

## 2017-01-06 DIAGNOSIS — C50511 Malignant neoplasm of lower-outer quadrant of right female breast: Secondary | ICD-10-CM

## 2017-01-06 DIAGNOSIS — Z170 Estrogen receptor positive status [ER+]: Principal | ICD-10-CM

## 2017-01-06 LAB — CBC WITH DIFFERENTIAL/PLATELET
BASO%: 1.2 % (ref 0.0–2.0)
Basophils Absolute: 0 10*3/uL (ref 0.0–0.1)
EOS ABS: 0.1 10*3/uL (ref 0.0–0.5)
EOS%: 1.7 % (ref 0.0–7.0)
HEMATOCRIT: 32.5 % — AB (ref 34.8–46.6)
HEMOGLOBIN: 11.7 g/dL (ref 11.6–15.9)
LYMPH#: 1.5 10*3/uL (ref 0.9–3.3)
LYMPH%: 43.6 % (ref 14.0–49.7)
MCH: 34.4 pg — ABNORMAL HIGH (ref 25.1–34.0)
MCHC: 36 g/dL (ref 31.5–36.0)
MCV: 95.6 fL (ref 79.5–101.0)
MONO#: 0.4 10*3/uL (ref 0.1–0.9)
MONO%: 10.5 % (ref 0.0–14.0)
NEUT%: 43 % (ref 38.4–76.8)
NEUTROS ABS: 1.5 10*3/uL (ref 1.5–6.5)
PLATELETS: 174 10*3/uL (ref 145–400)
RBC: 3.4 10*6/uL — AB (ref 3.70–5.45)
RDW: 14.4 % (ref 11.2–14.5)
WBC: 3.4 10*3/uL — AB (ref 3.9–10.3)

## 2017-01-06 LAB — COMPREHENSIVE METABOLIC PANEL
ALBUMIN: 3.8 g/dL (ref 3.5–5.0)
ALK PHOS: 43 U/L (ref 40–150)
ALT: 18 U/L (ref 0–55)
ANION GAP: 7 meq/L (ref 3–11)
AST: 22 U/L (ref 5–34)
BILIRUBIN TOTAL: 0.42 mg/dL (ref 0.20–1.20)
BUN: 14.5 mg/dL (ref 7.0–26.0)
CALCIUM: 9.5 mg/dL (ref 8.4–10.4)
CO2: 31 mEq/L — ABNORMAL HIGH (ref 22–29)
CREATININE: 0.8 mg/dL (ref 0.6–1.1)
Chloride: 101 mEq/L (ref 98–109)
EGFR: 78 mL/min/{1.73_m2} — AB (ref >90)
Glucose: 101 mg/dl (ref 70–140)
Potassium: 4.9 mEq/L (ref 3.5–5.1)
Sodium: 139 mEq/L (ref 136–145)
TOTAL PROTEIN: 7.4 g/dL (ref 6.4–8.3)

## 2017-01-06 MED ORDER — DENOSUMAB 120 MG/1.7ML ~~LOC~~ SOLN
120.0000 mg | Freq: Once | SUBCUTANEOUS | Status: AC
  Administered 2017-01-06: 120 mg via SUBCUTANEOUS
  Filled 2017-01-06: qty 1.7

## 2017-01-06 MED FILL — IBRANCE 75 MG CAPSULE: 75 | 28 days supply | Qty: 21 | Fill #2

## 2017-01-06 NOTE — Patient Instructions (Signed)
Denosumab injection ?y l thu?c g? DENOSUMAB lm ch?m s? suy y?u x??ng. Prolia ???c dng ?? ?i?u tr? ch?ng long x??ng ? ph? n? sau khi mn kinh v ? nam gi?i. Xgeva ???c dng ?? phng ng?a gy x??ng v cc v?n ?? khc v? x??ng gy ra b?i di c?n x??ng do ung th?. Xgeva c?ng ???c dng ?? ?i?u tr? b??u t? bo kh?ng l? (giant cell tumor) c?a x??ng. Thu?c ny c th? ???c dng cho nh?ng m?c ?ch khc; hy h?i ng??i cung c?p d?ch v? y t? ho?c d??c s? c?a mnh, n?u qu v? c th?c m?c. Ti c?n ph?i bo cho ng??i cung c?p d?ch v? y t? c?a mnh ?i?u g tr??c khi dng thu?c ny? H? c?n bi?t li?u qu v? c b?t k? tnh tr?ng no sau ?y khng: -b?nh v? r?ng -chm da -nhi?m trng ho?c ti?n s? nhi?m trng -b?nh th?n ho?c n?u qu v? ?ang ???c th?m phn -m?c calci ho?c vitamin D trong mu th?p -h?i ch?ng r?i lo?n h?p th? -???c x?p vo l?ch ?? gi?i ph?u ho?c nh? r?ng -?ang du?ng thu?c co? ch??a denosumab -b?nh tuy?n gip tr?ng ho?c tuy?n c?n gip tr?ng -ph?n ?ng b?t th??ng v?i denosumab -pha?n ??ng b?t th???ng ho??c di? ??ng v??i ca?c d??c ph?m kha?c -pha?n ??ng b?t th???ng ho??c di? ??ng v??i th??c ph?m, thu?c nhu?m, ho??c ch?t ba?o qua?n -?ang c thai ho??c ??nh co? thai -?ang cho con bu? Ti nn s? d?ng thu?c ny nh? th? no? Thu?c ny ?? tim d??i da. Thu?c ny ???c s? d?ng b?i chuyn vin y t? ? b?nh vi?n ho?c ? phng m?ch. N?u qu v? s?p dng thu?c Prolia, d??c s? s? ??a cho qu v? m?t B?n H??ng D?n v? D??c Ph?m (MedGuide) ??c bi?t cho m?i toa thu?c v cho m?i l?n mua thm thu?c ?. Hy b?o ??m ??c k? thng tin ny m?i l?n. ??i v?i Prolia, hy bn v?i bc s? nhi khoa c?a qu v? v? vi?c dng thu?c ny ? tr? em. C th? c?n ch?m sc ??c bi?t. ??i v?i Xgeva, hy bn v?i bc s? nhi khoa c?a qu v? v? vi?c dng thu?c ny ? tr? em. Thu?c ny c th? ???c k toa cho tr? em ch? m?i 13 tu?i trong nh?ng tr??ng h?p ch?n l?c, nh?ng c?n ph?i th?n tr?ng. Qu li?u: N?u qu v? cho r?ng mnh ? dng qu nhi?u  thu?c ny, th hy lin l?c v?i trung tm ki?m sot ch?t ??c ho?c phng c?p c?u ngay l?p t?c. L?U : Thu?c ny ch? dnh ring cho qu v?. Khng chia s? thu?c ny v?i nh?ng ng??i khc. N?u ti l? qun m?t li?u th sao? ?i?u quan tr?ng l khng nn b? l? li?u thu?c no. Hy lin l?c v?i bc s? ho?c chuyn vin y t? c?a mnh, n?u qu v? khng th? gi? ?ng cu?c h?n khm. Nh?ng g c th? t??ng tc v?i thu?c ny? Khng ???c dng thu?c ny cng v?i b?t k? th? no sau ?y: -nh??ng thu?c kha?c co? ch??a denosumab Thu?c ny c?ng c th? t??ng tc v?i cc thu?c sau ?y: -cc thu?c ?c ch? h? mi?n d?ch -cc thu?c ?i?u tr? b?nh ung th? -cc thu?c steroid, ch?ng h?n nh? prednisone ho?c cortisone Danh sch ny c th? khng m t? ?? h?t cc t??ng tc c th? x?y ra. Hy ??a cho ng??i cung c?p d?ch v? y t? c?a mnh danh sch t?t c? cc thu?c, th?o d??c, cc thu?c khng c?n toa, ho?c cc   ch? ph?m b? sung m qu v? dng. C?ng nn bo cho h? bi?t r?ng qu v? c ht thu?c, u?ng r??u, ho?c c s? d?ng ma ty tri php hay khng. Vi th? c th? t??ng tc v?i thu?c c?a qu v?. Ti c?n ph?i theo di ?i?u g trong khi dng thu?c ny? Hy ?i g?p bc s? ho?c chuyn vin y t? ?? theo di ??nh k? s? c?i thi?n c?a qu v?. Bc s? c th? yu c?u th? mu v lm cc xt nghi?m khc ?? theo di s? c?i thi?n c?a qu v?. Hy lin l?c v?i bc s? ho?c chuyn vin y t?, n?u qu v? b? c?m l?nh ho?c b? b?nh nhi?m trng khc trong khi dng thu?c ny. Khng ???c t? ?i?u tr? cho mnh. Thu?c ny c th? lm gi?m kh? n?ng ch?ng l?i cc b?nh nhi?m trng c?a c? th?. Qu v? c?n ph?i ch?c ch?n r?ng c? th? qu v? nh?n ???c ??y ?? l??ng calci v vitamin D trong th?i gian dng thu?c ny, tr? khi bc s? yu c?u qu v? ??ng lm nh? v?y. Hy th?o lu?n v?i bc s? ho?c chuyn vin y t? v? nh?ng th?c ph?m qu v? ?n v nh?ng vitamin qu v? dng. Hy ?i nha s? th??ng xuyn. ?nh r?ng ho?c x?a r?ng b?ng ch? nha khoa nh? ? ???c ch? d?n. N?u qu v? c ?i lm r?ng, th  hy bo v?i nha s? r?ng qu v? ?ang dng thu?c ny. Khng ???c c thai trong th?i gian dng thu?c ny ho?c trong vng 5 thng sau khi ng?ng dng thu?c. Ph? n? c?n ph?i thng bo cho bc s? c?a mnh, n?u mu?n c thai ho?c ngh? r?ng c th? mnh ? c thai. C nguy c? v? cc tc d?ng ph? nghim tr?ng ??i v?i thai nhi. Hy th?o lu?n v?i bc s? ho?c chuyn vin y t? ho?c d??c s? ?? bi?t thm thng tin. Ti c th? nh?n th?y nh?ng tc d?ng ph? no khi dng thu?c ny? Nh?ng tc d?ng ph? qu v? c?n ph?i bo cho bc s? ho?c chuyn vin y t? cng s?m cng t?t: -cc ph?n ?ng d? ?ng, ch?ng h?n nh? da b? m?n ??, ng?a, n?i my ?ay, s?ng ? m?t, mi, ho?c l??i -cc v?n ?? v? h h?p -?au ng?c -tim ??p nhanh ho?c khng ??u -c?m th?y chong vng, ng?t x?u, b? t -s?t, ?n l?nh, ho?c b?t k? d?u hi?u nhi?m trng no khc -co th?t, c?ng, ho?c gi?t c? b?p -t ho?c c?m gic nh? b? ki?n b -da b? r?p ho?c c u c?c, ho?c da b? kh, bong trc ho?c ?? -v?t th??ng lu lnh ho?c ?au khng r nguyn nhn ? mi?ng ho?c hm -b? b?m tm ho?c xu?t huy?t b?t th??ng Cc tc d?ng ph? khng c?n ph?i ch?m sc y t? (Hy bo cho bc s? ho?c chuyn vin y t? n?u cc tc d?ng ph? ny ti?p di?n ho?c gy phi?n toi): -?au c? b?p -kh ch?u bao t?, ??y h?i Danh sch ny c th? khng m t? ?? h?t cc tc d?ng ph? c th? x?y ra. Xin g?i t?i bc s? c?a mnh ?? ???c c? v?n chuyn mn v? cc tc d?ng ph?. Qu v? c th? t??ng trnh cc tc d?ng ph? cho FDA theo s? [phone]. Ti nn c?t gi? thu?c c?a mnh ? ?u? Thu?c ny ch? ???c dng trong phng m?ch, v?n phng bc s? ho?c c? s? y t? khc v s? khng ???c c?t gi?   t?i nh. L?U : ?y l b?n tm t?t. N c th? khng bao hm t?t c? thng tin c th? c. N?u qu v? th?c m?c v? thu?c ny, xin trao ??i v?i bc s?, d??c s?, ho?c ng??i cung c?p d?ch v? y t? c?a mnh.    2016, Elsevier/Gold Standard. (2012-07-05 00:00:00)  

## 2017-02-03 ENCOUNTER — Encounter: Payer: Self-pay | Admitting: Hematology and Oncology

## 2017-02-03 ENCOUNTER — Other Ambulatory Visit (HOSPITAL_BASED_OUTPATIENT_CLINIC_OR_DEPARTMENT_OTHER): Payer: Medicare HMO

## 2017-02-03 ENCOUNTER — Ambulatory Visit (HOSPITAL_BASED_OUTPATIENT_CLINIC_OR_DEPARTMENT_OTHER): Payer: Medicare HMO

## 2017-02-03 ENCOUNTER — Ambulatory Visit (HOSPITAL_BASED_OUTPATIENT_CLINIC_OR_DEPARTMENT_OTHER): Payer: Medicare HMO | Admitting: Hematology and Oncology

## 2017-02-03 DIAGNOSIS — C50511 Malignant neoplasm of lower-outer quadrant of right female breast: Secondary | ICD-10-CM

## 2017-02-03 DIAGNOSIS — Z170 Estrogen receptor positive status [ER+]: Secondary | ICD-10-CM

## 2017-02-03 DIAGNOSIS — C7951 Secondary malignant neoplasm of bone: Secondary | ICD-10-CM

## 2017-02-03 DIAGNOSIS — Z79811 Long term (current) use of aromatase inhibitors: Secondary | ICD-10-CM | POA: Diagnosis not present

## 2017-02-03 DIAGNOSIS — D701 Agranulocytosis secondary to cancer chemotherapy: Secondary | ICD-10-CM

## 2017-02-03 LAB — COMPREHENSIVE METABOLIC PANEL
ALT: 13 U/L (ref 0–55)
AST: 20 U/L (ref 5–34)
Albumin: 3.9 g/dL (ref 3.5–5.0)
Alkaline Phosphatase: 48 U/L (ref 40–150)
Anion Gap: 9 mEq/L (ref 3–11)
BUN: 14 mg/dL (ref 7.0–26.0)
CHLORIDE: 100 meq/L (ref 98–109)
CO2: 30 mEq/L — ABNORMAL HIGH (ref 22–29)
CREATININE: 0.8 mg/dL (ref 0.6–1.1)
Calcium: 9.7 mg/dL (ref 8.4–10.4)
EGFR: 78 mL/min/{1.73_m2} — ABNORMAL LOW (ref >90)
GLUCOSE: 97 mg/dL (ref 70–140)
Potassium: 5.2 mEq/L — ABNORMAL HIGH (ref 3.5–5.1)
SODIUM: 139 meq/L (ref 136–145)
TOTAL PROTEIN: 7.5 g/dL (ref 6.4–8.3)
Total Bilirubin: 0.47 mg/dL (ref 0.20–1.20)

## 2017-02-03 LAB — CBC WITH DIFFERENTIAL/PLATELET
BASO%: 1.3 % (ref 0.0–2.0)
Basophils Absolute: 0 10*3/uL (ref 0.0–0.1)
EOS%: 3.1 % (ref 0.0–7.0)
Eosinophils Absolute: 0.1 10*3/uL (ref 0.0–0.5)
HCT: 35.9 % (ref 34.8–46.6)
HGB: 12.2 g/dL (ref 11.6–15.9)
LYMPH%: 35.4 % (ref 14.0–49.7)
MCH: 35 pg — ABNORMAL HIGH (ref 25.1–34.0)
MCHC: 33.9 g/dL (ref 31.5–36.0)
MCV: 103.4 fL — ABNORMAL HIGH (ref 79.5–101.0)
MONO#: 0.4 10*3/uL (ref 0.1–0.9)
MONO%: 11.4 % (ref 0.0–14.0)
NEUT%: 48.8 % (ref 38.4–76.8)
NEUTROS ABS: 1.7 10*3/uL (ref 1.5–6.5)
Platelets: 295 10*3/uL (ref 145–400)
RBC: 3.47 10*6/uL — AB (ref 3.70–5.45)
RDW: 16 % — ABNORMAL HIGH (ref 11.2–14.5)
WBC: 3.6 10*3/uL — AB (ref 3.9–10.3)
lymph#: 1.3 10*3/uL (ref 0.9–3.3)

## 2017-02-03 MED ORDER — DENOSUMAB 120 MG/1.7ML ~~LOC~~ SOLN
120.0000 mg | Freq: Once | SUBCUTANEOUS | Status: AC
  Administered 2017-02-03: 120 mg via SUBCUTANEOUS
  Filled 2017-02-03: qty 1.7

## 2017-02-03 MED FILL — IBRANCE 75 MG CAPSULE: 75 | 28 days supply | Qty: 21 | Fill #3

## 2017-02-03 NOTE — Assessment & Plan Note (Signed)
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  Ibrance toxicities: 1. Neutropenia: Today's ANC is  Currently on 75 mg dose 2weeks on followed by 2 weeks off Denies any hot flashes or myalgias related to letrozole.  Bone metastases: OnXgeva to q 4weeks. Patient currently takes calcium with vitamin D Hypertension: Instructed the patient to follow with her primary care physician  CT chest abdomen pelvis 11/03/2016: Stable internal mamm LN, Mixed lesions in T spine and ribs unchangedNo further pleural effusion. Excellent response to treatment.  She will come monthly for labs and Xgeva RTC in 8weeks for blood work check.

## 2017-02-03 NOTE — Progress Notes (Signed)
Patient Care Team: [name], MD as PCP - General (Internal Medicine)  DIAGNOSIS:  Encounter Diagnosis  Name Primary?  . Malignant neoplasm of lower-outer quadrant of right breast of female, estrogen receptor positive (Jessica Zavala)     SUMMARY OF ONCOLOGIC HISTORY:   Breast cancer of lower-outer quadrant of right female breast (Jessica Zavala)   01/04/1995 Surgery    Right breast cancer status post lumpectomy followed by chemotherapy followed by radiation and tamoxifen 5 years (details are not available)      12/30/2015 Relapse/Recurrence    Chest wall/ soft tissue masses near the right side of the sternum, superior nodule measuring 14.5 mm, inferior nodule measuring 10.5 mm, 26 mm anterior mediastinal soft tissue mass, T9 bone metastases      12/30/2015 Procedure    Thoracentesis: 400 mL removed; showed reactive mesothelial cells, no cancer cells      12/30/2015 Imaging    CT chest: 26 x 17 mm anterior mediastinal soft tissue mass, mixed lytic and sclerotic bone mets mainly involving sternum and T9 vertebral body, scattered vertebral body involvement and rib involvement; chest wall soft tissue masses 14.5 mm, 10.5 mm      01/13/2016 Initial Biopsy    Right chest wall mass biopsy 2:30 position: Invasive ductal carcinoma focally involves skeletal muscle, grade 2, HER-2 negative ratio 1.08, ER/PR 95% positive      01/14/2016 PET scan    Widespread metastatic disease right breast, right chest wall, mediastinum, right internal mammary lymph nodes, right pleural space, malignant pleural effusion, right common iliac lymph node, bone metastases in the thorax      01/21/2016 -  Anti-estrogen oral therapy    Ibrance with letrozole       CHIEF COMPLIANT: Follow-up on Librax with letrozole  INTERVAL HISTORY: Jessica Zavala is a 20 year with above-mentioned history metastatic breast cancer currently on 5 g with letrozole. She has been on it for the past 1 year. She is tolerating it extremely well. The issue  has been have neutropenia. we had to reduce the dosage of Palbociclib to 2 weeks on 2 weeks off. She is here for a blood count check and toxicity evaluation.  REVIEW OF SYSTEMS:   Constitutional: Denies fevers, chills or abnormal weight loss Eyes: Denies blurriness of vision Ears, nose, mouth, throat, and face: Denies mucositis or sore throat Respiratory: Denies cough, dyspnea or wheezes Cardiovascular: Denies palpitation, chest discomfort Gastrointestinal:  Denies nausea, heartburn or change in bowel habits Skin: Denies abnormal skin rashes Lymphatics: Denies new lymphadenopathy or easy bruising Neurological:Denies numbness, tingling or new weaknesses Behavioral/Psych: Mood is stable, no new changes  Extremities: No lower extremity edema Breast:  denies any pain or lumps or nodules in either breasts All other systems were reviewed with the patient and are negative.  I have reviewed the past medical history, past surgical history, social history and family history with the patient and they are unchanged from previous note.  ALLERGIES:  is allergic to aspirin.  MEDICATIONS:  Current Outpatient Prescriptions  Medication Sig Dispense Refill  . acetaminophen (TYLENOL) 325 MG tablet Take 650 mg by mouth every 6 (six) hours as needed for mild pain.    . clobetasol cream (TEMOVATE) 1.91 % Apply 1 application topically 2 (two) times daily. 30 g 0  . cromolyn (OPTICROM) 4 % ophthalmic solution Place 1 drop into the right eye 4 (four) times daily. Reported on 06/24/2016    . fluorometholone (FML) 0.1 % ophthalmic suspension Reported on 06/24/2016    .  labetalol (NORMODYNE) 200 MG tablet Take 200 mg by mouth 2 (two) times daily.    Marland Kitchen letrozole (FEMARA) 2.5 MG tablet Take 1 tablet (2.5 mg total) by mouth daily. 90 tablet 3  . losartan (COZAAR) 100 MG tablet     . Menthol, Topical Analgesic, (BIOFREEZE COLORLESS) 4 % GEL Use in affected area TID prn (Patient not taking: Reported on 12/08/2016) 100 mL 0   . olmesartan (BENICAR) 40 MG tablet Take 40 mg by mouth daily. Reported on 06/24/2016    . Omega-3 Fatty Acids (FISH OIL PO) Take 1 capsule by mouth daily.    . palbociclib (IBRANCE) 75 MG capsule Take 1 capsule (75 mg total) by mouth daily with breakfast. 2 weeks on 2 weeks off 14 capsule 3  . phenazopyridine (PYRIDIUM) 200 MG tablet Take 1 tablet (200 mg total) by mouth 3 (three) times daily. (Patient not taking: Reported on 12/08/2016) 6 tablet 0  . simvastatin (ZOCOR) 40 MG tablet Take 40 mg by mouth daily. Reported on 06/24/2016    . triamcinolone (KENALOG) 0.1 % paste Use as directed 1 application in the mouth or throat 2 (two) times daily. (Patient not taking: Reported on 12/08/2016) 5 g 0   No current facility-administered medications for this visit.     PHYSICAL EXAMINATION: ECOG PERFORMANCE STATUS: 1 - Symptomatic but completely ambulatory  Vitals:   02/03/17 0857  BP: 125/65  Pulse: 75  Resp: 18  Temp: 97.6 F (36.4 C)   Filed Weights   02/03/17 0857  Weight: 121 lb 4.8 oz (55 kg)    GENERAL:alert, no distress and comfortable SKIN: skin color, texture, turgor are normal, no rashes or significant lesions EYES: normal, Conjunctiva are pink and non-injected, sclera clear OROPHARYNX:no exudate, no erythema and lips, buccal mucosa, and tongue normal  NECK: supple, thyroid normal size, non-tender, without nodularity LYMPH:  no palpable lymphadenopathy in the cervical, axillary or inguinal LUNGS: clear to auscultation and percussion with normal breathing effort HEART: regular rate & rhythm and no murmurs and no lower extremity edema ABDOMEN:abdomen soft, non-tender and normal bowel sounds MUSCULOSKELETAL:no cyanosis of digits and no clubbing  NEURO: alert & oriented x 3 with fluent speech, no focal motor/sensory deficits EXTREMITIES: No lower extremity edema   LABORATORY DATA:  I have reviewed the data as listed   Chemistry      Component Value Date/Time   NA 139  02/03/2017 0846   K 5.2 (H) 02/03/2017 0846   CL 102 12/20/2015 0509   CO2 30 (H) 02/03/2017 0846   BUN 14.0 02/03/2017 0846   CREATININE 0.8 02/03/2017 0846      Component Value Date/Time   CALCIUM 9.7 02/03/2017 0846   ALKPHOS 48 02/03/2017 0846   AST 20 02/03/2017 0846   ALT 13 02/03/2017 0846   BILITOT 0.47 02/03/2017 0846       Lab Results  Component Value Date   WBC 3.6 (L) 02/03/2017   HGB 12.2 02/03/2017   HCT 35.9 02/03/2017   MCV 103.4 (H) 02/03/2017   PLT 295 02/03/2017   NEUTROABS 1.7 02/03/2017    ASSESSMENT & PLAN:  Breast cancer of lower-outer quadrant of right female breast ([place]) Metastatic breast cancer PET-CT 01/14/16: Widespread metastatic disease right breast, right chest wall several nodules largest 3.7 cm, mediastinum, right internal mammary lymph node 2.2 cm, right pleural-based nodules 2.1cm and 2.7 cm, malignant pleural effusion, right common iliac lymph node, bone metastases in the thorax (sternum, manubrium, multiple thoracic vertebral bodies, multiple right-sided  ribs)  Right chest wall mass biopsy 2:30 position 01/13/2016: Invasive ductal carcinoma focally involves skeletal muscle, grade 2, HER-2 negative ratio 1.08, ER/PR pending (Right breast cancer 1996 status post lumpectomy followed by chemotherapy followed by radiation and tamoxifen 5 years (details are not available)) ---------------------------------------------------------------------------------------------------------------------------------------------------------- Treatment plan: Ibrance with letrozole started 01/20/2016 , cycle 3 Ibrance 100 mg, Cycle 4 Ibrance 75 mg dose; switch to 75 mg 3 weeks on 2 weeks of from 07/22/2016, changed to 2 weeks on 2 weeks off 11/04/2016.  Ibrance toxicities: 1. Neutropenia: Today's ANC is  Currently on 75 mg dose 2weeks on followed by 2 weeks off Denies any hot flashes or myalgias related to letrozole.  Bone metastases: OnXgeva to q 4weeks.  Patient currently takes calcium with vitamin D Hypertension: Instructed the patient to follow with her primary care physician  CT chest abdomen pelvis 11/03/2016: Stable internal mamm LN, Mixed lesions in T spine and ribs unchangedNo further pleural effusion. Excellent response to treatment.  She will come monthly for labs and Xgeva RTC in 3 months for blood work check after CT scans.   I spent 25 minutes talking to the patient of which more than half was spent in counseling and coordination of care.  Orders Placed This Encounter  Procedures  . CT Abdomen W Contrast    Standing Status:   Future    Standing Expiration Date:   02/03/2018    Order Specific Question:   If indicated for the ordered procedure, I authorize the administration of contrast media per Radiology protocol    Answer:   Yes    Order Specific Question:   Reason for Exam (SYMPTOM  OR DIAGNOSIS REQUIRED)    Answer:   Metastatic breast cancer restaging on treatment    Order Specific Question:   Preferred imaging location?    Answer:   [facility]    Order Specific Question:   Radiology Contrast Protocol will attach to exam    Answer:   \\[email or web address]  . CT Chest W Contrast    Standing Status:   Future    Standing Expiration Date:   02/03/2018    Order Specific Question:   If indicated for the ordered procedure, I authorize the administration of contrast media per Radiology protocol    Answer:   Yes    Order Specific Question:   Reason for Exam (SYMPTOM  OR DIAGNOSIS REQUIRED)    Answer:   Metastatic breast cancer restaging on treatment    Order Specific Question:   Preferred imaging location?    Answer:   [facility]    Order Specific Question:   Radiology Contrast Protocol will attach to exam    Answer:   \\[email or web address]   The patient has a good understanding of the overall plan. she agrees with it. she will call with any problems that may  develop before the next visit here.   Rulon Eisenmenger, MD 02/03/17

## 2017-02-06 ENCOUNTER — Other Ambulatory Visit: Payer: Self-pay | Admitting: Emergency Medicine

## 2017-02-06 DIAGNOSIS — C50511 Malignant neoplasm of lower-outer quadrant of right female breast: Secondary | ICD-10-CM

## 2017-02-06 MED ORDER — LETROZOLE 2.5 MG PO TABS: 2.5000 mg | ORAL_TABLET | Freq: Every day | ORAL | 3 refills | Status: DC

## 2017-02-14 ENCOUNTER — Ambulatory Visit (HOSPITAL_COMMUNITY)
Admission: RE | Admit: 2017-02-14 | Discharge: 2017-02-14 | Disposition: A | Discharge status: Home / Self Care | Payer: Medicare HMO | Source: Ambulatory Visit | Attending: Internal Medicine | Admitting: Internal Medicine

## 2017-02-14 ENCOUNTER — Ambulatory Visit (HOSPITAL_BASED_OUTPATIENT_CLINIC_OR_DEPARTMENT_OTHER)
Admission: RE | Admit: 2017-02-14 | Discharge: 2017-02-14 | Disposition: A | Discharge status: Home / Self Care | Payer: Medicare HMO | Source: Ambulatory Visit | Attending: Internal Medicine | Admitting: Internal Medicine

## 2017-02-14 ENCOUNTER — Encounter (HOSPITAL_COMMUNITY): Payer: Self-pay | Admitting: Internal Medicine

## 2017-02-14 VITALS — BP 118/76 | HR 76 | Wt 121.8 lb

## 2017-02-14 DIAGNOSIS — Z9221 Personal history of antineoplastic chemotherapy: Secondary | ICD-10-CM | POA: Diagnosis not present

## 2017-02-14 DIAGNOSIS — Z886 Allergy status to analgesic agent status: Secondary | ICD-10-CM | POA: Diagnosis not present

## 2017-02-14 DIAGNOSIS — Z9889 Other specified postprocedural states: Secondary | ICD-10-CM | POA: Insufficient documentation

## 2017-02-14 DIAGNOSIS — C50511 Malignant neoplasm of lower-outer quadrant of right female breast: Secondary | ICD-10-CM

## 2017-02-14 DIAGNOSIS — Z79899 Other long term (current) drug therapy: Secondary | ICD-10-CM | POA: Insufficient documentation

## 2017-02-14 DIAGNOSIS — Z923 Personal history of irradiation: Secondary | ICD-10-CM | POA: Insufficient documentation

## 2017-02-14 DIAGNOSIS — I10 Essential (primary) hypertension: Secondary | ICD-10-CM

## 2017-02-14 DIAGNOSIS — Z170 Estrogen receptor positive status [ER+]: Secondary | ICD-10-CM

## 2017-02-14 LAB — ECHOCARDIOGRAM COMPLETE
AVLVOTPG: 2 mmHg
CHL CUP DOP CALC LVOT VTI: 19.2 cm
E decel time: 190 msec
EERAT: 9.6
FS: 38 % (ref 28–44)
IVS/LV PW RATIO, ED: 0.86
LA ID, A-P, ES: 39 mm
LA diam end sys: 39 mm
LA diam index: 2.57 cm/m2
LA vol index: 28.4 mL/m2
LAVOL: 43 mL
LAVOLA4C: 46.8 mL
LV PW d: 9.37 mm — AB (ref 0.6–1.1)
LV TDI E'MEDIAL: 5.33
LV e' LATERAL: 7.94 cm/s
LVEEAVG: 9.6
LVEEMED: 9.6
LVOT SV: 39 mL
LVOT area: 2.01 cm2
LVOT diameter: 16 mm
LVOTPV: 76.9 cm/s
MV Dec: 190
MV pk E vel: 76.2 m/s
MVPG: 2 mmHg
MVPKAVEL: 82 m/s
RV LATERAL S' VELOCITY: 14.5 cm/s
RV TAPSE: 24.4 mm
RV sys press: 31 mmHg
Reg peak vel: 266 cm/s
TDI e' lateral: 7.94
TR max vel: 266 cm/s

## 2017-02-14 NOTE — Progress Notes (Signed)
CARDIO-ONCOLOGY CLINIC CONSULT NOTE  Referring Physician: Lindi Adie   HPI:  71 y/o [place] woman with h/o HTN, HL and recurrent metastatic breast cancer referred to [facility] by Dr. Lindi Adie.   History obtained via interpreter line with her and her husband  Denies any h/o known heart disease. Has been on losartan and labetalol for HTN. In 1996 had right breast CA and underwent surgery XRT and chemotherapy. I reviewed Oncology notes and details of what chemo she received in 1996 not available as it was on paper chart. I assume she may have got adriamycin.   Recently found to have recurrent metastatic disease in  right breast, right chest wall, mediastinum, right internal mammary lymph nodes, right pleural space, malignant pleural effusion, right common iliac lymph node, bone metastases in the thorax   Now being treated with Ibrance and letrazole.   Says she feels ok. Able to do all ADLs without too much difficulty. No SOB, CP or edema. + fatigue. Occasionally gets dizzy if stands too quickly.   Echo 02/14/17 EF 55-60% grade I DD. RV norma. LS' 10.7 (poor window) GLS -19.7%    Breast cancer of lower-outer quadrant of right female breast ([place])   01/04/1995 Surgery    Right breast cancer status post lumpectomy followed by chemotherapy followed by radiation and tamoxifen 5 years (details are not available)      12/30/2015 Relapse/Recurrence    Chest wall/ soft tissue masses near the right side of the sternum, superior nodule measuring 14.5 mm, inferior nodule measuring 10.5 mm, 26 mm anterior mediastinal soft tissue mass, T9 bone metastases      12/30/2015 Procedure    Thoracentesis: 400 mL removed; showed reactive mesothelial cells, no cancer cells      12/30/2015 Imaging    CT chest: 26 x 17 mm anterior mediastinal soft tissue mass, mixed lytic and sclerotic bone mets mainly involving sternum and T9 vertebral body, scattered vertebral body involvement  and rib involvement; chest wall soft tissue masses 14.5 mm, 10.5 mm      01/13/2016 Initial Biopsy    Right chest wall mass biopsy 2:30 position: Invasive ductal carcinoma focally involves skeletal muscle, grade 2, HER-2 negative ratio 1.08, ER/PR 95% positive      01/14/2016 PET scan    Widespread metastatic disease right breast, right chest wall, mediastinum, right internal mammary lymph nodes, right pleural space, malignant pleural effusion, right common iliac lymph node, bone metastases in the thorax      01/21/2016 -  Anti-estrogen oral therapy    Ibrance with letrozole        Review of Systems: [y] = yes, _0  = no   General: Weight gain _1 ; Weight loss _2 ; Anorexia _3 ; Fatigue [ y]; Fever _4 ; Chills _5 ; Weakness _6   Cardiac: Chest pain/pressure _7 ; Resting SOB _8 ; Exertional SOB _9 ; Orthopnea _10 ; Pedal Edema _11 ; Palpitations _12 ; Syncope _13 ; Presyncope _14 ; Paroxysmal nocturnal dyspnea_15   Pulmonary: Cough _16 ; Wheezing_17 ; Hemoptysis_18 ; Sputum _19 ; Snoring _20   GI: Vomiting_21 ; Dysphagia_22 ; Melena_23 ; Hematochezia _24 ; Heartburn_25 ; Abdominal pain _26 ; Constipation _27 ; Diarrhea _28 ; BRBPR _29   GU: Hematuria_30 ; Dysuria _31 ; Nocturia_32   Vascular: Pain in legs with walking _33 ; Pain in feet with lying flat _34 ; Non-healing sores _35 ; Stroke _36 ; TIA _37 ; Slurred speech _38 ;  Neuro: Headaches_39 ;  Vertigo_0 ; Seizures_1 ; Paresthesias_2 ;Blurred vision _3 ; Diplopia _4 ; Vision changes _5   Ortho/Skin: Arthritis _6 ; Joint pain _7 ; Muscle pain _8 ; Joint swelling _9 ; Back Pain _10 ; Rash _11   Psych: Depression_12 ; Anxiety_13   Heme: Bleeding problems _14 ; Clotting disorders _15 ; Anemia _16   Endocrine: Diabetes _17 ; Thyroid dysfunction_18    Past Medical History:  Diagnosis Date  . Allergy   . Cancer ([place])    Right side, sp lumpectomy, rxt and chemo  . High cholesterol   . Hyperlipidemia   . Hypertension     Current Outpatient Prescriptions    Medication Sig Dispense Refill  . acetaminophen (TYLENOL) 325 MG tablet Take 650 mg by mouth every 6 (six) hours as needed for mild pain.    . clobetasol cream (TEMOVATE) 6.96 % Apply 1 application topically 2 (two) times daily. 30 g 0  . labetalol (NORMODYNE) 200 MG tablet Take 200 mg by mouth 2 (two) times daily.    Marland Kitchen letrozole (FEMARA) 2.5 MG tablet Take 1 tablet (2.5 mg total) by mouth daily. 90 tablet 3  . losartan (COZAAR) 100 MG tablet     . Omega-3 Fatty Acids (FISH OIL PO) Take 1 capsule by mouth daily.    . palbociclib (IBRANCE) 75 MG capsule Take 1 capsule (75 mg total) by mouth daily with breakfast. 2 weeks on 2 weeks off 14 capsule 3  . simvastatin (ZOCOR) 40 MG tablet Take 40 mg by mouth daily. Reported on 06/24/2016    . cromolyn (OPTICROM) 4 % ophthalmic solution Place 1 drop into the right eye 4 (four) times daily. Reported on 06/24/2016    . fluorometholone (FML) 0.1 % ophthalmic suspension Reported on 06/24/2016    . Menthol, Topical Analgesic, (BIOFREEZE COLORLESS) 4 % GEL Use in affected area TID prn (Patient not taking: Reported on 02/14/2017) 100 mL 0  . triamcinolone (KENALOG) 0.1 % paste Use as directed 1 application in the mouth or throat 2 (two) times daily. (Patient not taking: Reported on 12/08/2016) 5 g 0   No current facility-administered medications for this encounter.     Allergies  Allergen Reactions  . Aspirin Swelling    Mouth and facial swelling      Social History   Social History  . Marital status: Married    Spouse name: N/A  . Number of children: N/A  . Years of education: N/A   Occupational History  . Not on file.   Social History Main Topics  . Smoking status: Never Smoker  . Smokeless tobacco: Never Used  . Alcohol use No  . Drug use: No  . Sexual activity: Yes    Birth control/ protection: Post-menopausal   Other Topics Concern  . Not on file   Social History Narrative  . No narrative on file     No family history on  file.  Vitals:   02/14/17 1056  BP: 118/76  Pulse: 76  SpO2: 98%  Weight: 121 lb 12 oz (55.2 kg)    PHYSICAL EXAM: General:  Well appearing. No respiratory difficulty HEENT: normal Neck: supple. no JVD. Carotids 2+ bilat; no bruits. No lymphadenopathy or thryomegaly appreciated. Cor: PMI nondisplaced. Regular rate & rhythm. No rubs, gallops or murmurs. Lungs: clear Abdomen: soft, nontender, nondistended. No hepatosplenomegaly. No bruits or masses. Good bowel sounds. Extremities: no cyanosis, clubbing, rash, edema Neuro: alert & oriented x 3, cranial nerves grossly intact. moves all 4 extremities  w/o difficulty. Affect pleasant.  ASSESSMENT & PLAN:  1. Widely metastatic recurrent breast cancer of lower-outer quadrant of right female breast ([place]) -- Initial breast CA in 1996. Suspect she had treatment with Adriamycin but not documented in notes --Currently receiving Ibrance with letrozole started 01/20/2016 --Echo today reviewed personally and all parameters stable.  --Ibrance and letrozole not associated with significant risk of cardiotoxicity. Will follow with echos q6 months   2. HTN --BP well controlled on labetalol and losartan --Now with some orthostasis. May need to reduce losartan  Kamarius Buckbee,MD 3:49 PM

## 2017-02-14 NOTE — Progress Notes (Signed)
  Echocardiogram 2D Echocardiogram has been performed.  Jessica Zavala 02/14/2017, 10:57 AM

## 2017-02-14 NOTE — Addendum Note (Signed)
Addended by: Scarlette Calico on: 02/14/2017 09:27 AM   Modules accepted: Orders

## 2017-02-14 NOTE — Patient Instructions (Signed)
Follow up and Echocardiogram with Dr.Bensimhon in 6 months.

## 2017-02-15 ENCOUNTER — Other Ambulatory Visit: Payer: Self-pay

## 2017-02-15 ENCOUNTER — Other Ambulatory Visit: Payer: Self-pay | Admitting: Hematology and Oncology

## 2017-02-15 DIAGNOSIS — Z170 Estrogen receptor positive status [ER+]: Secondary | ICD-10-CM

## 2017-02-15 DIAGNOSIS — C50919 Malignant neoplasm of unspecified site of unspecified female breast: Secondary | ICD-10-CM

## 2017-02-15 DIAGNOSIS — C50511 Malignant neoplasm of lower-outer quadrant of right female breast: Secondary | ICD-10-CM

## 2017-02-15 DIAGNOSIS — C7951 Secondary malignant neoplasm of bone: Secondary | ICD-10-CM

## 2017-02-27 ENCOUNTER — Ambulatory Visit (INDEPENDENT_AMBULATORY_CARE_PROVIDER_SITE_OTHER): Payer: Medicare HMO | Admitting: Physician Assistant

## 2017-02-27 VITALS — BP 219/110 | HR 64 | Temp 98.0°F | Resp 16 | Ht <= 58 in | Wt 124.4 lb

## 2017-02-27 DIAGNOSIS — Z1329 Encounter for screening for other suspected endocrine disorder: Secondary | ICD-10-CM | POA: Diagnosis not present

## 2017-02-27 DIAGNOSIS — I10 Essential (primary) hypertension: Secondary | ICD-10-CM

## 2017-02-27 DIAGNOSIS — R030 Elevated blood-pressure reading, without diagnosis of hypertension: Secondary | ICD-10-CM | POA: Diagnosis not present

## 2017-02-27 MED ORDER — LOSARTAN POTASSIUM-HCTZ 100-25 MG PO TABS: 1.0000 | ORAL_TABLET | Freq: Every day | ORAL | 3 refills | Status: DC

## 2017-02-27 NOTE — Progress Notes (Signed)
Jessica Zavala  MRN: [id number] DOB: 05-31-1946  PCP: Precious Reel, MD  Subjective:  Pt is a 71 year old female who presents to clinic for hand pain and medication management.   High blood pressure readiung - today's blood pressure is 210/101, repeat pressure eis 200/109. She has a history of HTN - previously controlled with losartan 100mg  and labetalol 200mg . She took her medications today. Reports compliance.  Denies headache, dizziness, LES swelling, orthopnea, visual disturbances, SOB She does not have PCP. Sees oncologist for breast cancer regularly.   She had a 2D echo 02/14/2017.   h/o eczema b/l hands. C/o itching and rash.   Review of Systems  Eyes: Negative for visual disturbance.  Respiratory: Negative for cough and shortness of breath.   Cardiovascular: Negative for chest pain, palpitations and leg swelling.  Genitourinary: Negative for enuresis and hematuria.  Skin: Positive for rash (b/l hands).  Neurological: Negative for dizziness and headaches.  Psychiatric/Behavioral: Negative for sleep disturbance.    Patient Active Problem List   Diagnosis Date Noted  . Bone metastases ([place]) 02/02/2016  . Breast cancer of lower-outer quadrant of right female breast ([place]) 01/05/2016  . Eczema of hand 11/15/2013  . Hypercholesterolemia 06/14/2012  . Hypertension 01/06/2012  . Eczema 01/06/2012    Current Outpatient Prescriptions on File Prior to Visit  Medication Sig Dispense Refill  . clobetasol cream (TEMOVATE) 9.83 % Apply 1 application topically 2 (two) times daily. 30 g 0  . cromolyn (OPTICROM) 4 % ophthalmic solution Place 1 drop into the right eye 4 (four) times daily. Reported on 06/24/2016    . fluorometholone (FML) 0.1 % ophthalmic suspension Reported on 06/24/2016    . labetalol (NORMODYNE) 200 MG tablet Take 200 mg by mouth 2 (two) times daily.    Marland Kitchen letrozole (FEMARA) 2.5 MG tablet Take 1 tablet (2.5 mg total) by mouth daily. 90 tablet 3  . losartan (COZAAR) 100  MG tablet     . Omega-3 Fatty Acids (FISH OIL PO) Take 1 capsule by mouth daily.    . palbociclib (IBRANCE) 75 MG capsule Take 1 capsule (75 mg total) by mouth daily with breakfast. 2 weeks on 2 weeks off 14 capsule 3  . simvastatin (ZOCOR) 40 MG tablet Take 40 mg by mouth daily. Reported on 06/24/2016    . acetaminophen (TYLENOL) 325 MG tablet Take 650 mg by mouth every 6 (six) hours as needed for mild pain.    . Menthol, Topical Analgesic, (BIOFREEZE COLORLESS) 4 % GEL Use in affected area TID prn (Patient not taking: Reported on 02/27/2017) 100 mL 0  . triamcinolone (KENALOG) 0.1 % paste Use as directed 1 application in the mouth or throat 2 (two) times daily. (Patient not taking: Reported on 12/08/2016) 5 g 0   No current facility-administered medications on file prior to visit.     Allergies  Allergen Reactions  . Aspirin Swelling    Mouth and facial swelling     Objective:  BP (!) 210/101 (BP Location: Left Arm, Patient Position: Sitting, Cuff Size: Normal)   Pulse 64   Temp 98 F (36.7 C) (Oral)   Resp 16   Ht 4\' 9"  (1.448 m)   Wt 124 lb 6.4 oz (56.4 kg)   SpO2 98%   BMI 26.92 kg/m   Physical Exam  Constitutional: She is oriented to person, place, and time and well-developed, well-nourished, and in no distress. No distress.  Neck: No JVD present.  Cardiovascular: Normal rate, regular rhythm,  S1 normal, S2 normal, normal heart sounds and normal pulses.   Pulmonary/Chest: Effort normal and breath sounds normal.  Musculoskeletal:       Right lower leg: She exhibits no swelling and no edema.       Left lower leg: She exhibits no swelling and no edema.  Neurological: She is alert and oriented to person, place, and time. GCS score is 15.  Skin: Skin is warm and dry.  Psychiatric: Mood, memory, affect and judgment normal.  Vitals reviewed.   Echocardiogram 02/14/2017-  Study Conclusions  - Left ventricle: The cavity size was normal. Systolic function was normal. The  estimated ejection fraction was in the range of 55% to 60%. Wall motion was normal; there were no regional wall motion abnormalities. Doppler parameters are consistent with abnormal left ventricular relaxation (grade 1 diastolic dysfunction). - Pulmonary arteries: PA peak pressure: 31 mm Hg (S). - Impressions: LS&' 10.7 (very poor windows) GLS -19.7%.  EKG - nsr. Anterior infarct of undetermined ago.  Assessment and Plan :  This case was discussed with Dr. Mitchel Honour.   1. Elevated blood pressure reading 2. Essential hypertension - Recheck vitals - EKG 12-Lead - losartan-hydrochlorothiazide (HYZAAR) 100-25 MG tablet; Take 1 tablet by mouth daily.  Dispense: 90 tablet; Refill: 3 - STOP taking your losartan. START taking HYZAAR. Encouraged DASH diet.  - Pt is asymptomatic today, physical exam is unremarkable. RTC in 3 days for blood pressure recheck. Consider medication changes at that appt. Consider cardiology referral if blood pressure continues to remain uncontrolled.   Mercer Pod, PA-C  [facility] [place] 02/27/2017 6:01 PM

## 2017-02-27 NOTE — Patient Instructions (Addendum)
STOP taking your losartan.  START taking HYZAAR. Please come back in 3 days for blood pressure recheck.  See below for food choices to help you lower your blood pressure.    Thank you for coming in today. I hope you feel we met your needs.  Feel free to call [facility] if you have any questions or further requests.  Please consider signing up for MyChart if you do not already have it, as this is a great way to communicate with me.  Best,  Whitney McVey, PA-C   DASH Eating Plan DASH stands for "Dietary Approaches to Stop Hypertension." The DASH eating plan is a healthy eating plan that has been shown to reduce high blood pressure (hypertension). It may also reduce your risk for type 2 diabetes, heart disease, and stroke. The DASH eating plan may also help with weight loss. What are tips for following this plan? General guidelines   Avoid eating more than 2,300 mg (milligrams) of salt (sodium) a day. If you have hypertension, you may need to reduce your sodium intake to 1,500 mg a day.  Limit alcohol intake to no more than 1 drink a day for nonpregnant women and 2 drinks a day for men. One drink equals 12 oz of beer, 5 oz of wine, or 1 oz of hard liquor.  Work with your health care provider to maintain a healthy body weight or to lose weight. Ask what an ideal weight is for you.  Get at least 30 minutes of exercise that causes your heart to beat faster (aerobic exercise) most days of the week. Activities may include walking, swimming, or biking.  Work with your health care provider or diet and nutrition specialist (dietitian) to adjust your eating plan to your individual calorie needs. Reading food labels   Check food labels for the amount of sodium per serving. Choose foods with less than 5 percent of the Daily Value of sodium. Generally, foods with less than 300 mg of sodium per serving fit into this eating plan.  To find whole grains, look for the word "whole" as the first word in the  ingredient list. Shopping   Buy products labeled as "low-sodium" or "no salt added."  Buy fresh foods. Avoid canned foods and premade or frozen meals. Cooking   Avoid adding salt when cooking. Use salt-free seasonings or herbs instead of table salt or sea salt. Check with your health care provider or pharmacist before using salt substitutes.  Do not fry foods. Cook foods using healthy methods such as baking, boiling, grilling, and broiling instead.  Cook with heart-healthy oils, such as olive, canola, soybean, or sunflower oil. Meal planning    Eat a balanced diet that includes:  5 or more servings of fruits and vegetables each day. At each meal, try to fill half of your plate with fruits and vegetables.  Up to 6-8 servings of whole grains each day.  Less than 6 oz of lean meat, poultry, or fish each day. A 3-oz serving of meat is about the same size as a deck of cards. One egg equals 1 oz.  2 servings of low-fat dairy each day.  A serving of nuts, seeds, or beans 5 times each week.  Heart-healthy fats. Healthy fats called Omega-3 fatty acids are found in foods such as flaxseeds and coldwater fish, like sardines, salmon, and mackerel.  Limit how much you eat of the following:  Canned or prepackaged foods.  Food that is high in trans fat, such  as fried foods.  Food that is high in saturated fat, such as fatty meat.  Sweets, desserts, sugary drinks, and other foods with added sugar.  Full-fat dairy products.  Do not salt foods before eating.  Try to eat at least 2 vegetarian meals each week.  Eat more home-cooked food and less restaurant, buffet, and fast food.  When eating at a restaurant, ask that your food be prepared with less salt or no salt, if possible. What foods are recommended? The items listed may not be a complete list. Talk with your dietitian about what dietary choices are best for you. Grains  Whole-grain or whole-wheat bread. Whole-grain or  whole-wheat pasta. Brown rice. [place] Morrow. Bulgur. Whole-grain and low-sodium cereals. Pita bread. Low-fat, low-sodium crackers. Whole-wheat flour tortillas. Vegetables  Fresh or frozen vegetables (raw, steamed, roasted, or grilled). Low-sodium or reduced-sodium tomato and vegetable juice. Low-sodium or reduced-sodium tomato sauce and tomato paste. Low-sodium or reduced-sodium canned vegetables. Fruits  All fresh, dried, or frozen fruit. Canned fruit in natural juice (without added sugar). Meat and other protein foods  Skinless chicken or [place]. Ground chicken or [place]. Pork with fat trimmed off. Fish and seafood. Egg whites. Dried beans, peas, or lentils. Unsalted nuts, nut butters, and seeds. Unsalted canned beans. Lean cuts of beef with fat trimmed off. Low-sodium, lean deli meat. Dairy  Low-fat (1%) or fat-free (skim) milk. Fat-free, low-fat, or reduced-fat cheeses. Nonfat, low-sodium ricotta or cottage cheese. Low-fat or nonfat yogurt. Low-fat, low-sodium cheese. Fats and oils  Soft margarine without trans fats. Vegetable oil. Low-fat, reduced-fat, or light mayonnaise and salad dressings (reduced-sodium). Canola, safflower, olive, soybean, and sunflower oils. Avocado. Seasoning and other foods  Herbs. Spices. Seasoning mixes without salt. Unsalted popcorn and pretzels. Fat-free sweets. What foods are not recommended? The items listed may not be a complete list. Talk with your dietitian about what dietary choices are best for you. Grains  Baked goods made with fat, such as croissants, muffins, or some breads. Dry pasta or rice meal packs. Vegetables  Creamed or fried vegetables. Vegetables in a cheese sauce. Regular canned vegetables (not low-sodium or reduced-sodium). Regular canned tomato sauce and paste (not low-sodium or reduced-sodium). Regular tomato and vegetable juice (not low-sodium or reduced-sodium). Angie Fava. Olives. Fruits  Canned fruit in a light or heavy syrup. Fried  fruit. Fruit in cream or butter sauce. Meat and other protein foods  Fatty cuts of meat. Ribs. Fried meat. Berniece Salines. Sausage. Bologna and other processed lunch meats. Salami. Fatback. Hotdogs. Bratwurst. Salted nuts and seeds. Canned beans with added salt. Canned or smoked fish. Whole eggs or egg yolks. Chicken or [place] with skin. Dairy  Whole or 2% milk, cream, and half-and-half. Whole or full-fat cream cheese. Whole-fat or sweetened yogurt. Full-fat cheese. Nondairy creamers. Whipped toppings. Processed cheese and cheese spreads. Fats and oils  Butter. Stick margarine. Lard. Shortening. Ghee. Bacon fat. Tropical oils, such as coconut, palm kernel, or palm oil. Seasoning and other foods  Salted popcorn and pretzels. Onion salt, garlic salt, seasoned salt, table salt, and sea salt. Worcestershire sauce. Tartar sauce. Barbecue sauce. Teriyaki sauce. Soy sauce, including reduced-sodium. Steak sauce. Canned and packaged gravies. Fish sauce. Oyster sauce. Cocktail sauce. Horseradish that you find on the shelf. Ketchup. Mustard. Meat flavorings and tenderizers. Bouillon cubes. Hot sauce and Tabasco sauce. Premade or packaged marinades. Premade or packaged taco seasonings. Relishes. Regular salad dressings. Where to find more information:  National Heart, Lung, and [place]: https://wilson-eaton.com/  American Heart Association: www.heart.org Summary  The DASH eating plan is a healthy eating plan that has been shown to reduce high blood pressure (hypertension). It may also reduce your risk for type 2 diabetes, heart disease, and stroke.  With the DASH eating plan, you should limit salt (sodium) intake to 2,300 mg a day. If you have hypertension, you may need to reduce your sodium intake to 1,500 mg a day.  When on the DASH eating plan, aim to eat more fresh fruits and vegetables, whole grains, lean proteins, low-fat dairy, and heart-healthy fats.  Work with your health care provider or diet and  nutrition specialist (dietitian) to adjust your eating plan to your individual calorie needs. This information is not intended to replace advice given to you by your health care provider. Make sure you discuss any questions you have with your health care provider. Document Released: 11/10/2011 Document Revised: 11/14/2016 Document Reviewed: 11/14/2016 Elsevier Interactive Patient Education  2017 [employer].  IF you received an x-ray today, you will receive an invoice from [facility]. Please contact [facility] at [phone] with questions or concerns regarding your invoice.   IF you received labwork today, you will receive an invoice from [place]. Please contact [facility] at [phone] with questions or concerns regarding your invoice.   Our billing staff will not be able to assist you with questions regarding bills from these companies.  You will be contacted with the lab results as soon as they are available. The fastest way to get your results is to activate your My Chart account. Instructions are located on the last page of this paperwork. If you have not heard from [place] regarding the results in 2 weeks, please contact this office.

## 2017-03-01 ENCOUNTER — Other Ambulatory Visit: Payer: Self-pay | Admitting: Emergency Medicine

## 2017-03-01 ENCOUNTER — Ambulatory Visit (INDEPENDENT_AMBULATORY_CARE_PROVIDER_SITE_OTHER): Payer: Medicare HMO | Admitting: Physician Assistant

## 2017-03-01 VITALS — BP 160/78 | HR 77 | Temp 97.6°F | Resp 18 | Ht <= 58 in | Wt 123.6 lb

## 2017-03-01 DIAGNOSIS — R030 Elevated blood-pressure reading, without diagnosis of hypertension: Secondary | ICD-10-CM

## 2017-03-01 DIAGNOSIS — L309 Dermatitis, unspecified: Secondary | ICD-10-CM | POA: Diagnosis not present

## 2017-03-01 DIAGNOSIS — I10 Essential (primary) hypertension: Secondary | ICD-10-CM

## 2017-03-01 MED ORDER — LABETALOL HCL 300 MG PO TABS: 300.0000 mg | ORAL_TABLET | Freq: Two times a day (BID) | ORAL | 1 refills | Status: DC

## 2017-03-01 MED ORDER — TRIAMCINOLONE ACETONIDE 0.1 % EX CREA: 1.0000 "application " | TOPICAL_CREAM | Freq: Two times a day (BID) | CUTANEOUS | 0 refills | Status: DC

## 2017-03-01 MED ORDER — METHYLPREDNISOLONE ACETATE 80 MG/ML IJ SUSP
80.0000 mg | Freq: Once | INTRAMUSCULAR | Status: AC
  Administered 2017-03-01: 80 mg via INTRAMUSCULAR

## 2017-03-01 NOTE — Patient Instructions (Addendum)
Please check her blood pressures at home and write them down on a piece of paper. Bring them with you at your next appt in 3 weeks.   Start getting regular exercise.   Continue taking Hyzaar 100-25 mg daily.  Start taking Labetalol 300 mg twice a day.    Come back and see me in 2-3 weeks.   Thank you for coming in today. I hope you feel we met your needs.  Feel free to call [facility] if you have any questions or further requests.  Please consider signing up for MyChart if you do not already have it, as this is a great way to communicate with me.  Best,  Whitney McVey, PA-C   DASH Eating Plan DASH stands for "Dietary Approaches to Stop Hypertension." The DASH eating plan is a healthy eating plan that has been shown to reduce high blood pressure (hypertension). It may also reduce your risk for type 2 diabetes, heart disease, and stroke. The DASH eating plan may also help with weight loss. What are tips for following this plan? General guidelines   Avoid eating more than 2,300 mg (milligrams) of salt (sodium) a day. If you have hypertension, you may need to reduce your sodium intake to 1,500 mg a day.  Limit alcohol intake to no more than 1 drink a day for nonpregnant women and 2 drinks a day for men. One drink equals 12 oz of beer, 5 oz of wine, or 1 oz of hard liquor.  Work with your health care provider to maintain a healthy body weight or to lose weight. Ask what an ideal weight is for you.  Get at least 30 minutes of exercise that causes your heart to beat faster (aerobic exercise) most days of the week. Activities may include walking, swimming, or biking.  Work with your health care provider or diet and nutrition specialist (dietitian) to adjust your eating plan to your individual calorie needs. Reading food labels   Check food labels for the amount of sodium per serving. Choose foods with less than 5 percent of the Daily Value of sodium. Generally, foods with less than 300 mg of  sodium per serving fit into this eating plan.  To find whole grains, look for the word "whole" as the first word in the ingredient list. Shopping   Buy products labeled as "low-sodium" or "no salt added."  Buy fresh foods. Avoid canned foods and premade or frozen meals. Cooking   Avoid adding salt when cooking. Use salt-free seasonings or herbs instead of table salt or sea salt. Check with your health care provider or pharmacist before using salt substitutes.  Do not fry foods. Cook foods using healthy methods such as baking, boiling, grilling, and broiling instead.  Cook with heart-healthy oils, such as olive, canola, soybean, or sunflower oil. Meal planning    Eat a balanced diet that includes:  5 or more servings of fruits and vegetables each day. At each meal, try to fill half of your plate with fruits and vegetables.  Up to 6-8 servings of whole grains each day.  Less than 6 oz of lean meat, poultry, or fish each day. A 3-oz serving of meat is about the same size as a deck of cards. One egg equals 1 oz.  2 servings of low-fat dairy each day.  A serving of nuts, seeds, or beans 5 times each week.  Heart-healthy fats. Healthy fats called Omega-3 fatty acids are found in foods such as flaxseeds and coldwater  fish, like sardines, salmon, and mackerel.  Limit how much you eat of the following:  Canned or prepackaged foods.  Food that is high in trans fat, such as fried foods.  Food that is high in saturated fat, such as fatty meat.  Sweets, desserts, sugary drinks, and other foods with added sugar.  Full-fat dairy products.  Do not salt foods before eating.  Try to eat at least 2 vegetarian meals each week.  Eat more home-cooked food and less restaurant, buffet, and fast food.  When eating at a restaurant, ask that your food be prepared with less salt or no salt, if possible. What foods are recommended? The items listed may not be a complete list. Talk with your  dietitian about what dietary choices are best for you. Grains  Whole-grain or whole-wheat bread. Whole-grain or whole-wheat pasta. Brown rice. [place]row. Bulgur. Whole-grain and low-sodium cereals. Pita bread. Low-fat, low-sodium crackers. Whole-wheat flour tortillas. Vegetables  Fresh or frozen vegetables (raw, steamed, roasted, or grilled). Low-sodium or reduced-sodium tomato and vegetable juice. Low-sodium or reduced-sodium tomato sauce and tomato paste. Low-sodium or reduced-sodium canned vegetables. Fruits  All fresh, dried, or frozen fruit. Canned fruit in natural juice (without added sugar). Meat and other protein foods  Skinless chicken or [place]. Ground chicken or [place]. Pork with fat trimmed off. Fish and seafood. Egg whites. Dried beans, peas, or lentils. Unsalted nuts, nut butters, and seeds. Unsalted canned beans. Lean cuts of beef with fat trimmed off. Low-sodium, lean deli meat. Dairy  Low-fat (1%) or fat-free (skim) milk. Fat-free, low-fat, or reduced-fat cheeses. Nonfat, low-sodium ricotta or cottage cheese. Low-fat or nonfat yogurt. Low-fat, low-sodium cheese. Fats and oils  Soft margarine without trans fats. Vegetable oil. Low-fat, reduced-fat, or light mayonnaise and salad dressings (reduced-sodium). Canola, safflower, olive, soybean, and sunflower oils. Avocado. Seasoning and other foods  Herbs. Spices. Seasoning mixes without salt. Unsalted popcorn and pretzels. Fat-free sweets. What foods are not recommended? The items listed may not be a complete list. Talk with your dietitian about what dietary choices are best for you. Grains  Baked goods made with fat, such as croissants, muffins, or some breads. Dry pasta or rice meal packs. Vegetables  Creamed or fried vegetables. Vegetables in a cheese sauce. Regular canned vegetables (not low-sodium or reduced-sodium). Regular canned tomato sauce and paste (not low-sodium or reduced-sodium). Regular tomato and vegetable juice  (not low-sodium or reduced-sodium). Angie Fava. Olives. Fruits  Canned fruit in a light or heavy syrup. Fried fruit. Fruit in cream or butter sauce. Meat and other protein foods  Fatty cuts of meat. Ribs. Fried meat. Berniece Salines. Sausage. Bologna and other processed lunch meats. Salami. Fatback. Hotdogs. Bratwurst. Salted nuts and seeds. Canned beans with added salt. Canned or smoked fish. Whole eggs or egg yolks. Chicken or [place] with skin. Dairy  Whole or 2% milk, cream, and half-and-half. Whole or full-fat cream cheese. Whole-fat or sweetened yogurt. Full-fat cheese. Nondairy creamers. Whipped toppings. Processed cheese and cheese spreads. Fats and oils  Butter. Stick margarine. Lard. Shortening. Ghee. Bacon fat. Tropical oils, such as coconut, palm kernel, or palm oil. Seasoning and other foods  Salted popcorn and pretzels. Onion salt, garlic salt, seasoned salt, table salt, and sea salt. Worcestershire sauce. Tartar sauce. Barbecue sauce. Teriyaki sauce. Soy sauce, including reduced-sodium. Steak sauce. Canned and packaged gravies. Fish sauce. Oyster sauce. Cocktail sauce. Horseradish that you find on the shelf. Ketchup. Mustard. Meat flavorings and tenderizers. Bouillon cubes. Hot sauce and Tabasco sauce. Premade or packaged  marinades. Premade or packaged taco seasonings. Relishes. Regular salad dressings. Where to find more information:  National Heart, Lung, and [place]: https://wilson-eaton.com/  American Heart Association: www.heart.org Summary  The DASH eating plan is a healthy eating plan that has been shown to reduce high blood pressure (hypertension). It may also reduce your risk for type 2 diabetes, heart disease, and stroke.  With the DASH eating plan, you should limit salt (sodium) intake to 2,300 mg a day. If you have hypertension, you may need to reduce your sodium intake to 1,500 mg a day.  When on the DASH eating plan, aim to eat more fresh fruits and vegetables, whole grains, lean  proteins, low-fat dairy, and heart-healthy fats.  Work with your health care provider or diet and nutrition specialist (dietitian) to adjust your eating plan to your individual calorie needs. This information is not intended to replace advice given to you by your health care provider. Make sure you discuss any questions you have with your health care provider. Document Released: 11/10/2011 Document Revised: 11/14/2016 Document Reviewed: 11/14/2016 Elsevier Interactive Patient Education  2017 [employer].  IF you received an x-ray today, you will receive an invoice from [facility]. Please contact [facility] at [phone] with questions or concerns regarding your invoice.   IF you received labwork today, you will receive an invoice from [place]. Please contact [facility] at [phone] with questions or concerns regarding your invoice.   Our billing staff will not be able to assist you with questions regarding bills from these companies.  You will be contacted with the lab results as soon as they are available. The fastest way to get your results is to activate your My Chart account. Instructions are located on the last page of this paperwork. If you have not heard from [place] regarding the results in 2 weeks, please contact this office.

## 2017-03-01 NOTE — Progress Notes (Signed)
Jessica Zavala  MRN: [id number] DOB: 11/14/1946  PCP: Precious Reel, MD  Subjective:  Pt is a pleasant 71 year old female who presents to clinic for f/u elevated blood pressure.  She is here today with her husband.    She was here 3/26 for hand rash/pain with a blood pressure of 219/110. Asymptomatic.  At that OV she was started on Hyzaar 100-25 mg qd. She is also taking Labetalol 200 mg bid.  Denies headache, chest pain, dizziness, lightheadedness, syncope.  Her husband reports home blood pressure this morning 139/80 OV blood pressure today 178/99, repeat is 160/78  c/o itchy rash on both hands. 5/10 Pain.   H/o She has appt with breast cancer center this Friday.   Review of Systems  Respiratory: Negative for cough and shortness of breath.   Cardiovascular: Negative for chest pain and palpitations.  Skin: Positive for rash.  Neurological: Negative for headaches.    Patient Active Problem List   Diagnosis Date Noted  . Bone metastases ([place]) 02/02/2016  . Breast cancer of lower-outer quadrant of right female breast ([place]) 01/05/2016  . Eczema of hand 11/15/2013  . Hypercholesterolemia 06/14/2012  . Hypertension 01/06/2012  . Eczema 01/06/2012    Current Outpatient Prescriptions on File Prior to Visit  Medication Sig Dispense Refill  . clobetasol cream (TEMOVATE) 5.46 % Apply 1 application topically 2 (two) times daily. 30 g 0  . fluorometholone (FML) 0.1 % ophthalmic suspension Reported on 06/24/2016    . labetalol (NORMODYNE) 200 MG tablet Take 200 mg by mouth 2 (two) times daily.    Marland Kitchen letrozole (FEMARA) 2.5 MG tablet Take 1 tablet (2.5 mg total) by mouth daily. 90 tablet 3  . losartan-hydrochlorothiazide (HYZAAR) 100-25 MG tablet Take 1 tablet by mouth daily. 90 tablet 3  . Menthol, Topical Analgesic, (BIOFREEZE COLORLESS) 4 % GEL Use in affected area TID prn 100 mL 0  . Omega-3 Fatty Acids (FISH OIL PO) Take 1 capsule by mouth daily.    . palbociclib (IBRANCE) 75 MG  capsule Take 1 capsule (75 mg total) by mouth daily with breakfast. 2 weeks on 2 weeks off 14 capsule 3  . simvastatin (ZOCOR) 40 MG tablet Take 40 mg by mouth daily. Reported on 06/24/2016    . acetaminophen (TYLENOL) 325 MG tablet Take 650 mg by mouth every 6 (six) hours as needed for mild pain.    . cromolyn (OPTICROM) 4 % ophthalmic solution Place 1 drop into the right eye 4 (four) times daily. Reported on 06/24/2016    . triamcinolone (KENALOG) 0.1 % paste Use as directed 1 application in the mouth or throat 2 (two) times daily. (Patient not taking: Reported on 03/01/2017) 5 g 0   No current facility-administered medications on file prior to visit.     Allergies  Allergen Reactions  . Aspirin Swelling    Mouth and facial swelling     Objective:  BP (!) 178/99   Pulse 77   Temp 97.6 F (36.4 C) (Oral)   Resp 18   Ht 4\' 9"  (1.448 m)   Wt 123 lb 9.6 oz (56.1 kg)   SpO2 98%   BMI 26.75 kg/m   Physical Exam  Constitutional: She is oriented to person, place, and time and well-developed, well-nourished, and in no distress. No distress.  Cardiovascular: Normal rate, regular rhythm and normal heart sounds.   Neurological: She is alert and oriented to person, place, and time. GCS score is 15.  Skin: Skin is  warm and dry. Rash noted.  B/l hands erythematous papules and vesicles with exudation and crusting, fissures on several digits.    Psychiatric: Mood, memory, affect and judgment normal.  Vitals reviewed.    BP Readings from Last 3 Encounters:  03/01/17 (!) 160/78  02/27/17 (!) 219/110  02/14/17 118/76    Assessment and Plan :  1. Hypertension, unspecified type 2. Elevated blood pressure reading - labetalol (NORMODYNE) 300 MG tablet; Take 1 tablet (300 mg total) by mouth 2 (two) times daily.  Dispense: 60 tablet; Refill: 1 - Recheck vitals - Pt's blood pressure continues to be uncontrolled with Hyzaar 100-25 mg and labetalol 200mg  bid. Will increase her beta blocker to  300mg  bid. She is to check homeblood pressure readings, write them down and bring the readings with her to her next OV. Encouraged DASH diet and exercise. RTC in 2-3 weeks for recheck. Consider referral to cardiology at that time if no improvement.  3. Chronic dermatitis of hands - triamcinolone cream (KENALOG) 0.1 %; Apply 1 application topically 2 (two) times daily.  Dispense: 30 g; Refill: 0 - methylPREDNISolone acetate (DEPO-MEDROL) injection 80 mg; Inject 1 mL (80 mg total) into the muscle once. - Acute on chronic outbreak of eczema.    Mercer Pod, PA-C  [facility] [place] 03/01/2017 5:34 PM

## 2017-03-03 ENCOUNTER — Other Ambulatory Visit: Payer: Self-pay | Admitting: *Deleted

## 2017-03-03 ENCOUNTER — Ambulatory Visit: Payer: Commercial Managed Care - HMO | Admitting: Hematology and Oncology

## 2017-03-03 ENCOUNTER — Other Ambulatory Visit (HOSPITAL_BASED_OUTPATIENT_CLINIC_OR_DEPARTMENT_OTHER): Payer: Medicare HMO

## 2017-03-03 ENCOUNTER — Ambulatory Visit (HOSPITAL_BASED_OUTPATIENT_CLINIC_OR_DEPARTMENT_OTHER): Payer: Medicare HMO

## 2017-03-03 ENCOUNTER — Other Ambulatory Visit: Payer: Self-pay | Admitting: Emergency Medicine

## 2017-03-03 VITALS — BP 117/54 | HR 66 | Temp 98.2°F | Resp 18

## 2017-03-03 DIAGNOSIS — C50511 Malignant neoplasm of lower-outer quadrant of right female breast: Secondary | ICD-10-CM | POA: Diagnosis not present

## 2017-03-03 DIAGNOSIS — Z170 Estrogen receptor positive status [ER+]: Principal | ICD-10-CM

## 2017-03-03 DIAGNOSIS — C7951 Secondary malignant neoplasm of bone: Secondary | ICD-10-CM | POA: Diagnosis not present

## 2017-03-03 LAB — CBC WITH DIFFERENTIAL/PLATELET
BASO%: 0.5 % (ref 0.0–2.0)
BASOS ABS: 0 10*3/uL (ref 0.0–0.1)
EOS ABS: 0.2 10*3/uL (ref 0.0–0.5)
EOS%: 5.2 % (ref 0.0–7.0)
HCT: 33.3 % — ABNORMAL LOW (ref 34.8–46.6)
HGB: 11.9 g/dL (ref 11.6–15.9)
LYMPH%: 32.8 % (ref 14.0–49.7)
MCH: 34.7 pg — AB (ref 25.1–34.0)
MCHC: 35.7 g/dL (ref 31.5–36.0)
MCV: 97.1 fL (ref 79.5–101.0)
MONO#: 0.5 10*3/uL (ref 0.1–0.9)
MONO%: 13.3 % (ref 0.0–14.0)
NEUT#: 1.9 10*3/uL (ref 1.5–6.5)
NEUT%: 48.2 % (ref 38.4–76.8)
PLATELETS: 208 10*3/uL (ref 145–400)
RBC: 3.43 10*6/uL — AB (ref 3.70–5.45)
RDW: 14.1 % (ref 11.2–14.5)
WBC: 3.8 10*3/uL — ABNORMAL LOW (ref 3.9–10.3)
lymph#: 1.3 10*3/uL (ref 0.9–3.3)

## 2017-03-03 LAB — COMPREHENSIVE METABOLIC PANEL
ALBUMIN: 4 g/dL (ref 3.5–5.0)
ALT: 13 U/L (ref 0–55)
ANION GAP: 8 meq/L (ref 3–11)
AST: 21 U/L (ref 5–34)
Alkaline Phosphatase: 53 U/L (ref 40–150)
BUN: 11.9 mg/dL (ref 7.0–26.0)
CO2: 30 meq/L — AB (ref 22–29)
Calcium: 9.7 mg/dL (ref 8.4–10.4)
Chloride: 88 mEq/L — ABNORMAL LOW (ref 98–109)
Creatinine: 0.8 mg/dL (ref 0.6–1.1)
EGFR: 71 mL/min/{1.73_m2} — AB (ref >90)
Glucose: 101 mg/dl (ref 70–140)
POTASSIUM: 4.9 meq/L (ref 3.5–5.1)
SODIUM: 126 meq/L — AB (ref 136–145)
Total Bilirubin: 0.54 mg/dL (ref 0.20–1.20)
Total Protein: 7.6 g/dL (ref 6.4–8.3)

## 2017-03-03 MED ORDER — CLOBETASOL PROPIONATE 0.05 % EX CREA: 1.0000 "application " | TOPICAL_CREAM | Freq: Two times a day (BID) | CUTANEOUS | 0 refills | Status: AC | PRN

## 2017-03-03 MED ORDER — DENOSUMAB 120 MG/1.7ML ~~LOC~~ SOLN
120.0000 mg | Freq: Once | SUBCUTANEOUS | Status: AC
  Administered 2017-03-03: 120 mg via SUBCUTANEOUS
  Filled 2017-03-03: qty 1.7

## 2017-03-03 MED FILL — IBRANCE 75 MG CAPSULE: 75 | 28 days supply | Qty: 21 | Fill #0

## 2017-03-03 NOTE — Patient Instructions (Signed)
Denosumab injection ?y l thu?c g? DENOSUMAB lm ch?m s? suy y?u x??ng. Prolia ???c dng ?? ?i?u tr? ch?ng long x??ng ? ph? n? sau khi mn kinh v ? nam gi?i. Xgeva ???c dng ?? phng ng?a gy x??ng v cc v?n ?? khc v? x??ng gy ra b?i di c?n x??ng do ung th?Delton See c?ng ???c dng ?? ?i?u tr? b??u t? bo kh?ng l? (giant cell tumor) c?a x??ng. Thu?c ny c th? ???c dng cho nh?ng m?c ?ch khc; hy h?i ng??i cung c?p d?ch v? y t? ho?c d??c s? c?a mnh, n?u qu v? c th?c m?c. Ti c?n ph?i bo cho ng??i cung c?p d?ch v? y t? c?a mnh ?i?u g tr??c khi dng thu?c ny? H? c?n bi?t li?u qu v? c b?t k? tnh tr?ng no sau ?y khng: -b?nh v? r?ng -chm da -nhi?m trng ho?c ti?n s? nhi?m trng -b?nh th?n ho?c n?u qu v? ?ang ???c th?m phn -m?c calci ho?c vitamin D trong mu th?p -h?i ch?ng r?i lo?n h?p th? -???c x?p vo l?ch ?? gi?i ph?u ho?c nh? r?ng -?ang du?ng thu?c co? ch??a denosumab -b?nh tuy?n gip tr?ng ho?c tuy?n c?n gip tr?ng -ph?n ?ng b?t th??ng v?i denosumab -pha?n ??ng b?t th???ng ho??c di? ??ng v??i ca?c d??c ph?m kha?c -pha?n ??ng b?t th???ng ho??c di? ??ng v??i th??c ph?m, thu?c nhu?m, ho??c ch?t ba?o qua?n -?ang c thai ho??c ??nh co? thai -?ang cho con bu? Ti nn s? d?ng thu?c ny nh? th? no? Thu?c ny ?? tim d??i da. Thu?c ny ???c s? d?ng b?i chuyn vin y t? ? b?nh vi?n ho?c ? phng m?ch. N?u qu v? s?p dng thu?c Prolia, d??c s? s? ??a cho qu v? m?t B?n H??ng D?n v? D??c Ph?m (MedGuide) ??c bi?t cho m?i toa thu?c v cho m?i l?n mua thm thu?c ?. Hy b?o ??m ??c k? thng tin ny m?i l?n. ??i v?i Prolia, hy bn v?i bc s? nhi khoa c?a qu v? v? vi?c dng thu?c ny ? tr? em. C th? c?n ch?m [place] ??c bi?t. ??i v?i Xgeva, hy bn v?i bc s? nhi khoa c?a qu v? v? vi?c dng thu?c ny ? tr? em. Thu?c ny c th? ???c k toa cho tr? em ch? m?i 13 tu?i trong nh?ng tr??ng h?p ch?n l?c, nh?ng c?n ph?i th?n tr?ng. Qu li?u: N?u qu v? cho r?ng mnh ? dng qu nhi?u  thu?c ny, th hy lin l?c v?i trung tm ki?m sot ch?t ??c ho?c phng c?p c?u ngay l?p t?c. L?U : Thu?c ny ch? dnh ring cho qu v?. Khng chia s? thu?c ny v?i nh?ng ng??i khc. N?u ti l? qun m?t li?u th sao? ?i?u quan tr?ng l khng nn b? l? li?u thu?c no. Hy lin l?c v?i bc s? ho?c [place] vin y t? c?a mnh, n?u qu v? khng th? gi? ?ng cu?c h?n khm. Nh?ng g c th? t??ng tc v?i thu?c ny? Khng ???c dng thu?c ny cng v?i b?t k? th? no sau ?y: -nh??ng thu?c kha?c co? ch??a denosumab Thu?c ny c?ng c th? t??ng tc v?i cc thu?c sau ?y: -cc thu?c ?c ch? h? mi?n d?ch -cc thu?c ?i?u tr? b?nh ung th? -cc thu?c steroid, ch?ng h?n nh? prednisone ho?c cortisone Danh sch ny c th? khng m t? ?? h?t cc t??ng tc c th? x?y ra. Hy ??a cho ng??i cung c?p d?ch v? y t? c?a mnh danh sch t?t c? cc thu?c, th?o d??c, cc thu?c khng c?n toa, ho?c cc  ch? ph?m b? sung m qu v? dng. C?ng nn bo cho h? bi?t r?ng qu v? c ht thu?c, u?ng r??u, ho?c c s? d?ng ma ty tri php hay khng. Vi th? c th? t??ng tc v?i thu?c c?a qu v?. Ti c?n ph?i theo di ?i?u g trong khi dng thu?c ny? Hy ?i g?p bc s? ho?c chuyn vin y t? ?? theo di ??nh k? s? c?i thi?n c?a qu v?. Bc s? c th? yu c?u th? mu v lm cc xt nghi?m khc ?? theo di s? c?i thi?n c?a qu v?. Hy lin l?c v?i bc s? ho?c chuyn vin y t?, n?u qu v? b? c?m l?nh ho?c b? b?nh nhi?m trng khc trong khi dng thu?c ny. Khng ???c t? ?i?u tr? cho mnh. Thu?c ny c th? lm gi?m kh? n?ng ch?ng l?i cc b?nh nhi?m trng c?a c? th?. Qu v? c?n ph?i ch?c ch?n r?ng c? th? qu v? nh?n ???c ??y ?? l??ng calci v vitamin D trong th?i gian dng thu?c ny, tr? khi bc s? yu c?u qu v? ??ng lm nh? v?y. Hy th?o lu?n v?i bc s? ho?c chuyn vin y t? v? nh?ng th?c ph?m qu v? ?n v nh?ng vitamin qu v? dng. Hy ?i nha s? th??ng xuyn. ?nh r?ng ho?c x?a r?ng b?ng ch? nha khoa nh? ? ???c ch? d?n. N?u qu v? c ?i lm r?ng, th  hy bo v?i nha s? r?ng qu v? ?ang dng thu?c ny. Khng ???c c thai trong th?i gian dng thu?c ny ho?c trong vng 5 thng sau khi ng?ng dng thu?c. Ph? n? c?n ph?i thng bo cho bc s? c?a mnh, n?u mu?n c thai ho?c ngh? r?ng c th? mnh ? c [place]. C nguy c? v? cc tc d?ng ph? nghim tr?ng ??i v?i [place] nhi. Hy th?o lu?n v?i bc s? ho?c chuyn vin y t? ho?c d??c s? ?? bi?t thm thng tin. Ti c th? nh?n th?y nh?ng tc d?ng ph? no khi dng thu?c ny? Nh?ng tc d?ng ph? qu v? c?n ph?i bo cho bc s? ho?c chuyn vin y t? cng s?m cng t?t: -cc ph?n ?ng d? ?ng, ch?ng h?n nh? da b? m?n ??, ng?a, n?i my ?ay, s?ng ? m?t, mi, ho?c l??i -cc v?n ?? v? h h?p -?au ng?c -tim ??p nhanh ho?c khng ??u -c?m th?y chong vng, ng?t x?u, b? t -s?t, ?n l?nh, ho?c b?t k? d?u hi?u nhi?m trng no khc -co th?t, c?ng, ho?c gi?t c? b?p -t ho?c c?m gic nh? b? ki?n b -da b? r?p ho?c c u c?c, ho?c da b? kh, bong trc ho?c ?? -v?t th??ng lu lnh ho?c ?au khng r nguyn nhn ? mi?ng ho?c hm -b? b?m tm ho?c xu?t huy?t b?t th??ng Cc tc d?ng ph? khng c?n ph?i ch?m [place] y t? (Hy bo cho bc s? ho?c chuyn vin y t? n?u cc tc d?ng ph? ny ti?p di?n ho?c gy phi?n toi): -?au c? b?p -kh ch?u bao t?, ??y h?i Danh sch ny c th? khng m t? ?? h?t cc tc d?ng ph? c th? x?y ra. Xin g?i t?i bc s? c?a mnh ?? ???c c? v?n chuyn mn v? cc tc d?ng ph?Sander Nephew v? c th? t??ng trnh cc tc d?ng ph? cho FDA theo s? [phone]. Ti nn c?t gi? thu?c c?a mnh ? ?u? Thu?c ny ch? ???c dng trong phng m?ch, v?n phng bc s? ho?c c? s? y t? khc v s? khng ???c c?t gi?  t?i nh. L?U : ?y l b?n tm t?t. N c th? khng bao hm t?t c? thng tin c th? c. N?u qu v? th?c m?c v? thu?c ny, xin trao ??i v?i bc s?, d??c s?, ho?c ng??i cung c?p d?ch v? y t? c?a mnh.    2016, Elsevier/Gold Standard. (2012-07-05 00:00:00)

## 2017-03-04 NOTE — Addendum Note (Signed)
Encounter addended by: Jolaine Artist, MD on: 03/04/2017 11:34 PM<BR>    Actions taken: LOS modified

## 2017-03-23 ENCOUNTER — Other Ambulatory Visit: Payer: Self-pay | Admitting: Physician Assistant

## 2017-03-23 DIAGNOSIS — L309 Dermatitis, unspecified: Secondary | ICD-10-CM

## 2017-03-24 NOTE — Telephone Encounter (Signed)
03/01/17 last ov and refill

## 2017-03-27 ENCOUNTER — Ambulatory Visit (INDEPENDENT_AMBULATORY_CARE_PROVIDER_SITE_OTHER): Payer: Medicare HMO | Admitting: Physician Assistant

## 2017-03-27 VITALS — BP 122/74 | HR 83 | Temp 97.8°F | Resp 16 | Ht <= 58 in | Wt 120.8 lb

## 2017-03-27 DIAGNOSIS — I10 Essential (primary) hypertension: Secondary | ICD-10-CM | POA: Diagnosis not present

## 2017-03-27 DIAGNOSIS — L309 Dermatitis, unspecified: Secondary | ICD-10-CM | POA: Diagnosis not present

## 2017-03-27 DIAGNOSIS — R030 Elevated blood-pressure reading, without diagnosis of hypertension: Secondary | ICD-10-CM | POA: Diagnosis not present

## 2017-03-27 MED ORDER — TRIAMCINOLONE ACETONIDE 0.1 % EX CREA: TOPICAL_CREAM | CUTANEOUS | 0 refills | Status: DC

## 2017-03-27 MED ORDER — LABETALOL HCL 200 MG PO TABS: 200.0000 mg | ORAL_TABLET | Freq: Two times a day (BID) | ORAL | 1 refills | Status: AC

## 2017-03-27 NOTE — Progress Notes (Signed)
Jessica Zavala  MRN: [id number] DOB: [birth year]  PCP: Precious Reel, MD  Subjective:  Pt is a pleasant 71 year old female who presents to clinic for blood pressure check.   H/o HTN - Last OV 3/28 - "Pt's blood pressure continues to be uncontrolled with Hyzaar 100-25 mg and labetalol 200mg  bid. Will increase her beta blocker to 300mg  bid. She is to check homeblood pressure readings, write them down and bring the readings with her to her next OV. Encouraged DASH diet and exercise. RTC in 2-3 weeks for recheck. Consider referral to cardiology at that time if no improvement."  She took several readings at home: 82/40, 57/32, 73/32, 105/73, 75/33 Labetalol 300mg  about 1 hour ago after her blood pressure at check in was 176/82. She endorses some lightheadedness. Denies syncope, headache, n/v/d, chest pain, palpitations.   Hyzaar 100-25mg  - last dose was this morning at 10 am.   C/o chronic rash b/l hands. Is improving with a cream she was prescribed last time she was here. She needs refill.   Review of Systems  Constitutional: Negative for diaphoresis and fatigue.  Respiratory: Negative for cough, chest tightness, shortness of breath and wheezing.   Cardiovascular: Negative for chest pain and palpitations.  Gastrointestinal: Negative for diarrhea, nausea and vomiting.  Neurological: Positive for light-headedness. Negative for syncope and headaches.    Patient Active Problem List   Diagnosis Date Noted  . Bone metastases ([place]) 02/02/2016  . Breast cancer of lower-outer quadrant of right female breast ([place]) 01/05/2016  . Eczema of hand 11/15/2013  . Hypercholesterolemia 06/14/2012  . Hypertension 01/06/2012  . Eczema 01/06/2012    Current Outpatient Prescriptions on File Prior to Visit  Medication Sig Dispense Refill  . acetaminophen (TYLENOL) 325 MG tablet Take 650 mg by mouth every 6 (six) hours as needed for mild pain.    . clobetasol cream (TEMOVATE) 7.62 % Apply 1 application  topically 2 (two) times daily as needed. 30 g 0  . fluorometholone (FML) 0.1 % ophthalmic suspension Reported on 06/24/2016    . labetalol (NORMODYNE) 200 MG tablet Take 200 mg by mouth 2 (two) times daily.    Marland Kitchen labetalol (NORMODYNE) 300 MG tablet Take 1 tablet (300 mg total) by mouth 2 (two) times daily. 60 tablet 1  . letrozole (FEMARA) 2.5 MG tablet Take 1 tablet (2.5 mg total) by mouth daily. 90 tablet 3  . losartan-hydrochlorothiazide (HYZAAR) 100-25 MG tablet Take 1 tablet by mouth daily. 90 tablet 3  . Menthol, Topical Analgesic, (BIOFREEZE COLORLESS) 4 % GEL Use in affected area TID prn 100 mL 0  . Omega-3 Fatty Acids (FISH OIL PO) Take 1 capsule by mouth daily.    . palbociclib (IBRANCE) 75 MG capsule Take 1 capsule (75 mg total) by mouth daily with breakfast. 2 weeks on 2 weeks off 14 capsule 3  . simvastatin (ZOCOR) 40 MG tablet Take 40 mg by mouth daily. Reported on 06/24/2016    . triamcinolone (KENALOG) 0.1 % paste Use as directed 1 application in the mouth or throat 2 (two) times daily. 5 g 0  . triamcinolone cream (KENALOG) 0.1 % APPLY TO AFFECTED AREA TWICE DAILY 80 g 0  . cromolyn (OPTICROM) 4 % ophthalmic solution Place 1 drop into the right eye 4 (four) times daily. Reported on 06/24/2016     No current facility-administered medications on file prior to visit.     Allergies  Allergen Reactions  . Aspirin Swelling    Mouth and  facial swelling     Objective:  BP (!) 176/82 (BP Location: Left Arm, Patient Position: Sitting, Cuff Size: Small)   Pulse 83   Temp 97.8 F (36.6 C) (Oral)   Resp 16   Ht 4\' 9"  (1.448 m)   Wt 120 lb 12.8 oz (54.8 kg)   SpO2 98%   BMI 26.14 kg/m   Physical Exam  Constitutional: She is oriented to person, place, and time and well-developed, well-nourished, and in no distress. No distress.  Cardiovascular: Normal rate, regular rhythm and normal heart sounds.   Pulmonary/Chest: Effort normal and breath sounds normal. No respiratory distress.    Neurological: She is alert and oriented to person, place, and time. GCS score is 15.  Skin: Skin is warm and dry.  Psychiatric: Mood, memory, affect and judgment normal.  Vitals reviewed.   Assessment and Plan :  1. Essential hypertension 2. Elevated blood pressure reading - labetalol (NORMODYNE) 200 MG tablet; Take 1 tablet (200 mg total) by mouth 2 (two) times daily.  Dispense: 30 tablet; Refill: 1 - Last OV she was advised to increase labetalol to 300 mg qd due to uncontrolled blood pressure. Her home readings have been hypotensive. Will reduce labetalol to 200 mg. Con't Hyzaar 100-25 mg qd. Sheet printed out for her to record home readings in a more organized fashion. She is to bring this back with her at her next OV. RTC in 1-2 weeks for blood pressure recheck - sooner if she becomes symptomatic. She understands and agrees with plan.   3. Chronic dermatitis of hands - triamcinolone cream (KENALOG) 0.1 %; APPLY TO AFFECTED AREA TWICE DAILY  Dispense: 80 g; Refill: 0  Whitney Magdeline Prange, PA-C  [facility] 03/27/2017 6:05 PM

## 2017-03-27 NOTE — Patient Instructions (Addendum)
Start taking Labetalol 200mg  twice a day.  Come back in one week for blood pressure recheck.   BLOOD PRESSURE READINGS AT HOME - please write down the time you took your blood pressure and what the reading is. Before medication or after medication?   Monday Apr 23   Tuesday Apr 24   Wednesday Apr 25  Thursday Apr 26  Friday Apr 27  Saturday Apr 28  Sunday Apr 29  Monday Apr 30

## 2017-03-30 ENCOUNTER — Other Ambulatory Visit: Payer: Self-pay | Admitting: Hematology and Oncology

## 2017-03-31 ENCOUNTER — Other Ambulatory Visit: Payer: Self-pay | Admitting: Hematology and Oncology

## 2017-03-31 ENCOUNTER — Ambulatory Visit (HOSPITAL_BASED_OUTPATIENT_CLINIC_OR_DEPARTMENT_OTHER): Payer: Medicare HMO

## 2017-03-31 ENCOUNTER — Other Ambulatory Visit (HOSPITAL_BASED_OUTPATIENT_CLINIC_OR_DEPARTMENT_OTHER): Payer: Medicare HMO

## 2017-03-31 VITALS — BP 121/65 | HR 65 | Temp 97.8°F | Resp 18

## 2017-03-31 DIAGNOSIS — C50511 Malignant neoplasm of lower-outer quadrant of right female breast: Secondary | ICD-10-CM

## 2017-03-31 DIAGNOSIS — C7951 Secondary malignant neoplasm of bone: Secondary | ICD-10-CM

## 2017-03-31 DIAGNOSIS — Z170 Estrogen receptor positive status [ER+]: Principal | ICD-10-CM

## 2017-03-31 LAB — COMPREHENSIVE METABOLIC PANEL
ALT: 15 U/L (ref 0–55)
ANION GAP: 9 meq/L (ref 3–11)
AST: 26 U/L (ref 5–34)
Albumin: 4 g/dL (ref 3.5–5.0)
Alkaline Phosphatase: 45 U/L (ref 40–150)
BUN: 15.9 mg/dL (ref 7.0–26.0)
CHLORIDE: 86 meq/L — AB (ref 98–109)
CO2: 30 meq/L — AB (ref 22–29)
CREATININE: 0.8 mg/dL (ref 0.6–1.1)
Calcium: 9.7 mg/dL (ref 8.4–10.4)
EGFR: 73 mL/min/{1.73_m2} — ABNORMAL LOW (ref >90)
GLUCOSE: 99 mg/dL (ref 70–140)
Potassium: 5.2 mEq/L — ABNORMAL HIGH (ref 3.5–5.1)
SODIUM: 125 meq/L — AB (ref 136–145)
Total Bilirubin: 0.52 mg/dL (ref 0.20–1.20)
Total Protein: 7.6 g/dL (ref 6.4–8.3)

## 2017-03-31 MED ORDER — DENOSUMAB 120 MG/1.7ML ~~LOC~~ SOLN
120.0000 mg | Freq: Once | SUBCUTANEOUS | Status: AC
  Administered 2017-03-31: 120 mg via SUBCUTANEOUS
  Filled 2017-03-31: qty 1.7

## 2017-03-31 MED FILL — IBRANCE 75 MG CAPSULE: 75 | 28 days supply | Qty: 21 | Fill #0

## 2017-03-31 NOTE — Patient Instructions (Signed)
Denosumab injection ?y l thu?c g? DENOSUMAB lm ch?m s? suy y?u x??ng. Prolia ???c dng ?? ?i?u tr? ch?ng long x??ng ? ph? n? sau khi mn kinh v ? nam gi?i. Xgeva ???c dng ?? phng ng?a gy x??ng v cc v?n ?? khc v? x??ng gy ra b?i di c?n x??ng do ung th?Delton See c?ng ???c dng ?? ?i?u tr? b??u t? bo kh?ng l? (giant cell tumor) c?a x??ng. Thu?c ny c th? ???c dng cho nh?ng m?c ?ch khc; hy h?i ng??i cung c?p d?ch v? y t? ho?c d??c s? c?a mnh, n?u qu v? c th?c m?c. Ti c?n ph?i bo cho ng??i cung c?p d?ch v? y t? c?a mnh ?i?u g tr??c khi dng thu?c ny? H? c?n bi?t li?u qu v? c b?t k? tnh tr?ng no sau ?y khng: -b?nh v? r?ng -chm da -nhi?m trng ho?c ti?n s? nhi?m trng -b?nh th?n ho?c n?u qu v? ?ang ???c th?m phn -m?c calci ho?c vitamin D trong mu th?p -h?i ch?ng r?i lo?n h?p th? -???c x?p vo l?ch ?? gi?i ph?u ho?c nh? r?ng -?ang du?ng thu?c co? ch??a denosumab -b?nh tuy?n gip tr?ng ho?c tuy?n c?n gip tr?ng -ph?n ?ng b?t th??ng v?i denosumab -pha?n ??ng b?t th???ng ho??c di? ??ng v??i ca?c d??c ph?m kha?c -pha?n ??ng b?t th???ng ho??c di? ??ng v??i th??c ph?m, thu?c nhu?m, ho??c ch?t ba?o qua?n -?ang c thai ho??c ??nh co? thai -?ang cho con bu? Ti nn s? d?ng thu?c ny nh? th? no? Thu?c ny ?? tim d??i da. Thu?c ny ???c s? d?ng b?i chuyn vin y t? ? b?nh vi?n ho?c ? phng m?ch. N?u qu v? s?p dng thu?c Prolia, d??c s? s? ??a cho qu v? m?t B?n H??ng D?n v? D??c Ph?m (MedGuide) ??c bi?t cho m?i toa thu?c v cho m?i l?n mua thm thu?c ?. Hy b?o ??m ??c k? thng tin ny m?i l?n. ??i v?i Prolia, hy bn v?i bc s? nhi khoa c?a qu v? v? vi?c dng thu?c ny ? tr? em. C th? c?n ch?m [place] ??c bi?t. ??i v?i Xgeva, hy bn v?i bc s? nhi khoa c?a qu v? v? vi?c dng thu?c ny ? tr? em. Thu?c ny c th? ???c k toa cho tr? em ch? m?i 13 tu?i trong nh?ng tr??ng h?p ch?n l?c, nh?ng c?n ph?i th?n tr?ng. Qu li?u: N?u qu v? cho r?ng mnh ? dng qu nhi?u  thu?c ny, th hy lin l?c v?i trung tm ki?m sot ch?t ??c ho?c phng c?p c?u ngay l?p t?c. L?U : Thu?c ny ch? dnh ring cho qu v?. Khng chia s? thu?c ny v?i nh?ng ng??i khc. N?u ti l? qun m?t li?u th sao? ?i?u quan tr?ng l khng nn b? l? li?u thu?c no. Hy lin l?c v?i bc s? ho?c [place] vin y t? c?a mnh, n?u qu v? khng th? gi? ?ng cu?c h?n khm. Nh?ng g c th? t??ng tc v?i thu?c ny? Khng ???c dng thu?c ny cng v?i b?t k? th? no sau ?y: -nh??ng thu?c kha?c co? ch??a denosumab Thu?c ny c?ng c th? t??ng tc v?i cc thu?c sau ?y: -cc thu?c ?c ch? h? mi?n d?ch -cc thu?c ?i?u tr? b?nh ung th? -cc thu?c steroid, ch?ng h?n nh? prednisone ho?c cortisone Danh sch ny c th? khng m t? ?? h?t cc t??ng tc c th? x?y ra. Hy ??a cho ng??i cung c?p d?ch v? y t? c?a mnh danh sch t?t c? cc thu?c, th?o d??c, cc thu?c khng c?n toa, ho?c cc  ch? ph?m b? sung m qu v? dng. C?ng nn bo cho h? bi?t r?ng qu v? c ht thu?c, u?ng r??u, ho?c c s? d?ng ma ty tri php hay khng. Vi th? c th? t??ng tc v?i thu?c c?a qu v?. Ti c?n ph?i theo di ?i?u g trong khi dng thu?c ny? Hy ?i g?p bc s? ho?c chuyn vin y t? ?? theo di ??nh k? s? c?i thi?n c?a qu v?. Bc s? c th? yu c?u th? mu v lm cc xt nghi?m khc ?? theo di s? c?i thi?n c?a qu v?. Hy lin l?c v?i bc s? ho?c chuyn vin y t?, n?u qu v? b? c?m l?nh ho?c b? b?nh nhi?m trng khc trong khi dng thu?c ny. Khng ???c t? ?i?u tr? cho mnh. Thu?c ny c th? lm gi?m kh? n?ng ch?ng l?i cc b?nh nhi?m trng c?a c? th?. Qu v? c?n ph?i ch?c ch?n r?ng c? th? qu v? nh?n ???c ??y ?? l??ng calci v vitamin D trong th?i gian dng thu?c ny, tr? khi bc s? yu c?u qu v? ??ng lm nh? v?y. Hy th?o lu?n v?i bc s? ho?c chuyn vin y t? v? nh?ng th?c ph?m qu v? ?n v nh?ng vitamin qu v? dng. Hy ?i nha s? th??ng xuyn. ?nh r?ng ho?c x?a r?ng b?ng ch? nha khoa nh? ? ???c ch? d?n. N?u qu v? c ?i lm r?ng, th  hy bo v?i nha s? r?ng qu v? ?ang dng thu?c ny. Khng ???c c thai trong th?i gian dng thu?c ny ho?c trong vng 5 thng sau khi ng?ng dng thu?c. Ph? n? c?n ph?i thng bo cho bc s? c?a mnh, n?u mu?n c thai ho?c ngh? r?ng c th? mnh ? c [place]. C nguy c? v? cc tc d?ng ph? nghim tr?ng ??i v?i [place] nhi. Hy th?o lu?n v?i bc s? ho?c chuyn vin y t? ho?c d??c s? ?? bi?t thm thng tin. Ti c th? nh?n th?y nh?ng tc d?ng ph? no khi dng thu?c ny? Nh?ng tc d?ng ph? qu v? c?n ph?i bo cho bc s? ho?c chuyn vin y t? cng s?m cng t?t: -cc ph?n ?ng d? ?ng, ch?ng h?n nh? da b? m?n ??, ng?a, n?i my ?ay, s?ng ? m?t, mi, ho?c l??i -cc v?n ?? v? h h?p -?au ng?c -tim ??p nhanh ho?c khng ??u -c?m th?y chong vng, ng?t x?u, b? t -s?t, ?n l?nh, ho?c b?t k? d?u hi?u nhi?m trng no khc -co th?t, c?ng, ho?c gi?t c? b?p -t ho?c c?m gic nh? b? ki?n b -da b? r?p ho?c c u c?c, ho?c da b? kh, bong trc ho?c ?? -v?t th??ng lu lnh ho?c ?au khng r nguyn nhn ? mi?ng ho?c hm -b? b?m tm ho?c xu?t huy?t b?t th??ng Cc tc d?ng ph? khng c?n ph?i ch?m [place] y t? (Hy bo cho bc s? ho?c chuyn vin y t? n?u cc tc d?ng ph? ny ti?p di?n ho?c gy phi?n toi): -?au c? b?p -kh ch?u bao t?, ??y h?i Danh sch ny c th? khng m t? ?? h?t cc tc d?ng ph? c th? x?y ra. Xin g?i t?i bc s? c?a mnh ?? ???c c? v?n chuyn mn v? cc tc d?ng ph?Sander Nephew v? c th? t??ng trnh cc tc d?ng ph? cho FDA theo s? [phone]. Ti nn c?t gi? thu?c c?a mnh ? ?u? Thu?c ny ch? ???c dng trong phng m?ch, v?n phng bc s? ho?c c? s? y t? khc v s? khng ???c c?t gi?  t?i nh. L?U : ?y l b?n tm t?t. N c th? khng bao hm t?t c? thng tin c th? c. N?u qu v? th?c m?c v? thu?c ny, xin trao ??i v?i bc s?, d??c s?, ho?c ng??i cung c?p d?ch v? y t? c?a mnh.    2016, Elsevier/Gold Standard. (2012-07-05 00:00:00)

## 2017-04-12 ENCOUNTER — Ambulatory Visit (INDEPENDENT_AMBULATORY_CARE_PROVIDER_SITE_OTHER): Payer: Medicare HMO | Admitting: Physician Assistant

## 2017-04-12 ENCOUNTER — Encounter: Payer: Self-pay | Admitting: Physician Assistant

## 2017-04-12 VITALS — BP 140/82 | HR 78 | Temp 98.1°F | Resp 17 | Ht <= 58 in | Wt 121.0 lb

## 2017-04-12 DIAGNOSIS — R05 Cough: Secondary | ICD-10-CM

## 2017-04-12 DIAGNOSIS — R059 Cough, unspecified: Secondary | ICD-10-CM

## 2017-04-12 DIAGNOSIS — J069 Acute upper respiratory infection, unspecified: Secondary | ICD-10-CM

## 2017-04-12 DIAGNOSIS — I10 Essential (primary) hypertension: Secondary | ICD-10-CM

## 2017-04-12 DIAGNOSIS — R030 Elevated blood-pressure reading, without diagnosis of hypertension: Secondary | ICD-10-CM

## 2017-04-12 DIAGNOSIS — R0981 Nasal congestion: Secondary | ICD-10-CM | POA: Diagnosis not present

## 2017-04-12 LAB — POC INFLUENZA A&B (BINAX/QUICKVUE)
Influenza A, POC: NEGATIVE
Influenza B, POC: NEGATIVE

## 2017-04-12 MED ORDER — MUCINEX DM MAXIMUM STRENGTH 60-1200 MG PO TB12: 1.0000 | ORAL_TABLET | Freq: Two times a day (BID) | ORAL | 1 refills | Status: DC

## 2017-04-12 MED ORDER — BENZONATATE 100 MG PO CAPS: 100.0000 mg | ORAL_CAPSULE | Freq: Three times a day (TID) | ORAL | 0 refills | Status: DC | PRN

## 2017-04-12 NOTE — Progress Notes (Signed)
Jessica Zavala  MRN: [id number] DOB: 27-Nov-1946  PCP: [name], MD  Subjective:  Pt is a 71 year old female who presents to clinic for blood pressure check.  H/o HTN  OV 3/28 - "Pt's blood pressure continues to be uncontrolled with Hyzaar 100-25 mg and labetalol 200mg  bid. Will increase her beta blocker to 300mg  bid. She is to check homeblood pressure readings, write them down and bring the readings with her to her next OV. Encouraged DASH diet and exercise. RTC in 2-3 weeks for recheck. Consider referral to cardiology at that time if no improvement. She took several readings at home: 82/40, 57/32, 73/32, 105/73, 75/33"  At her last OV 4/23, she was advised to lower her dose of Labetalol from 300mg  to 200 mg after she had several hypotensive readings at home. She was asked to f/u in 1-2 weeks.   Today's blood pressure is 140/82. She is feeling well today.  She brought a list of her blood pressures over the past week at home: 114/58, 105/59, 113/52, 104/50, 122/65, 125/66.  Denies chest pain, syncope, headache, palpitation, orthopnea, leg swelling.   Care team: She has appt with her PCP in 2 weeks.   Today she c/o cough and runny nose x 2 days. She has not taken anything to feel better. Denies fever, chills, sneezing, headache, chest pain, palpitations, wheezing, chest tightness.   Review of Systems  Constitutional: Negative for chills, diaphoresis, fatigue and fever.  HENT: Positive for congestion, postnasal drip and rhinorrhea. Negative for sinus pressure, sneezing and sore throat.   Respiratory: Positive for cough. Negative for chest tightness, shortness of breath and wheezing.   Cardiovascular: Negative for chest pain and palpitations.  Gastrointestinal: Negative for abdominal pain, diarrhea, nausea and vomiting.  Neurological: Negative for weakness, light-headedness and headaches.    Patient Active Problem List   Diagnosis Date Noted  . Bone metastases ([place]) 02/02/2016  .  Breast cancer of lower-outer quadrant of right female breast ([place]) 01/05/2016  . Eczema of hand 11/15/2013  . Hypercholesterolemia 06/14/2012  . Hypertension 01/06/2012  . Eczema 01/06/2012    Current Outpatient Prescriptions on File Prior to Visit  Medication Sig Dispense Refill  . acetaminophen (TYLENOL) 325 MG tablet Take 650 mg by mouth every 6 (six) hours as needed for mild pain.    . clobetasol cream (TEMOVATE) 5.46 % Apply 1 application topically 2 (two) times daily as needed. 30 g 0  . cromolyn (OPTICROM) 4 % ophthalmic solution Place 1 drop into the right eye 4 (four) times daily. Reported on 06/24/2016    . fluorometholone (FML) 0.1 % ophthalmic suspension Reported on 06/24/2016    . IBRANCE 75 MG capsule TAKE 1 CAPSULE BY MOUTH ONCE DAILY 21 capsule 0  . labetalol (NORMODYNE) 200 MG tablet Take 1 tablet (200 mg total) by mouth 2 (two) times daily. 30 tablet 1  . letrozole (FEMARA) 2.5 MG tablet Take 1 tablet (2.5 mg total) by mouth daily. 90 tablet 3  . losartan-hydrochlorothiazide (HYZAAR) 100-25 MG tablet Take 1 tablet by mouth daily. 90 tablet 3  . Menthol, Topical Analgesic, (BIOFREEZE COLORLESS) 4 % GEL Use in affected area TID prn 100 mL 0  . Omega-3 Fatty Acids (FISH OIL PO) Take 1 capsule by mouth daily.    . palbociclib (IBRANCE) 75 MG capsule Take 1 capsule (75 mg total) by mouth daily with breakfast. 2 weeks on 2 weeks off 14 capsule 3  . simvastatin (ZOCOR) 40 MG tablet Take 40  mg by mouth daily. Reported on 06/24/2016    . triamcinolone (KENALOG) 0.1 % paste Use as directed 1 application in the mouth or throat 2 (two) times daily. 5 g 0  . triamcinolone cream (KENALOG) 0.1 % APPLY TO AFFECTED AREA TWICE DAILY 80 g 0   No current facility-administered medications on file prior to visit.     Allergies  Allergen Reactions  . Aspirin Swelling    Mouth and facial swelling     Objective:  BP 140/82   Pulse 78   Temp 98.1 F (36.7 C) (Oral)   Resp 17   Ht 4\' 9"   (1.448 m)   Wt 121 lb (54.9 kg)   SpO2 97%   BMI 26.18 kg/m   Physical Exam  Constitutional: She is oriented to person, place, and time and well-developed, well-nourished, and in no distress. No distress.  HENT:  Right Ear: Tympanic membrane normal.  Left Ear: Tympanic membrane normal.  Nose: Mucosal edema present. No rhinorrhea. Right sinus exhibits no maxillary sinus tenderness and no frontal sinus tenderness. Left sinus exhibits no maxillary sinus tenderness and no frontal sinus tenderness.  Mouth/Throat: Oropharynx is clear and moist and mucous membranes are normal.  Cardiovascular: Normal rate, regular rhythm and normal heart sounds.   Pulmonary/Chest: Effort normal and breath sounds normal. No respiratory distress. She has no wheezes. She has no rales.  Neurological: She is alert and oriented to person, place, and time. GCS score is 15.  Skin: Skin is warm and dry.  Psychiatric: Mood, memory, affect and judgment normal.  Vitals reviewed.   Results for orders placed or performed in visit on 04/12/17  POC Influenza A&B(BINAX/QUICKVUE)  Result Value Ref Range   Influenza A, POC Negative Negative   Influenza B, POC Negative Negative    Assessment and Plan :  1. Essential hypertension 2. Elevated blood pressure reading - Recheck vitals - Blood pressure is wnl. Con't current dose of medications. Keep f/u appt with PCP in 2 weeks.    3. Acute upper respiratory infection 4. Cough 5. Nasal congestion - benzonatate (TESSALON) 100 MG capsule; Take 1-2 capsules (100-200 mg total) by mouth 3 (three) times daily as needed for cough.  Dispense: 40 capsule; Refill: 0 - POC Influenza A&B(BINAX/QUICKVUE) - Dextromethorphan-Guaifenesin (MUCINEX DM MAXIMUM STRENGTH) 60-1200 MG TB12; Take 1 tablet by mouth every 12 (twelve) hours.  Dispense: 14 each; Refill: 1 - Negative flu. Suspect viral illness. Will treat supportively: Push fluids and rest. RTC in 5-7 days if no improvement.   Mercer Pod, PA-C  [facility] [place] 04/12/2017 5:30 PM

## 2017-04-12 NOTE — Patient Instructions (Addendum)
You are negative for the flu. Your blood pressure looks great!   Start taking Mucinex . please drink plenty of water while you are taking this medication. Tessalon is for cough. Take this as directed.  Come back in 7-10 days if you are still not better.    Thank you for coming in today. I hope you feel we met your needs.  Feel free to call [facility] if you have any questions or further requests.  Please consider signing up for MyChart if you do not already have it, as this is a great way to communicate with me.  Best,  Whitney McVey, PA-C  IF you received an x-ray today, you will receive an invoice from [facility]. Please contact [facility] at [phone] with questions or concerns regarding your invoice.   IF you received labwork today, you will receive an invoice from [facility]. Please contact [facility] at [phone] with questions or concerns regarding your invoice.   Our billing staff will not be able to assist you with questions regarding bills from these companies.  You will be contacted with the lab results as soon as they are available. The fastest way to get your results is to activate your My Chart account. Instructions are located on the last page of this paperwork. If you have not heard from [place] regarding the results in 2 weeks, please contact this office.

## 2017-04-15 ENCOUNTER — Encounter (HOSPITAL_COMMUNITY): Payer: Self-pay

## 2017-04-15 ENCOUNTER — Inpatient Hospital Stay (HOSPITAL_COMMUNITY)
Admission: EM | Admit: 2017-04-15 | Discharge: 2017-04-23 | DRG: 193 | Disposition: A | Discharge status: Home Health | Payer: Medicare HMO | Attending: Family Medicine | Admitting: Family Medicine

## 2017-04-15 ENCOUNTER — Emergency Department (HOSPITAL_COMMUNITY): Payer: Medicare HMO

## 2017-04-15 DIAGNOSIS — D6481 Anemia due to antineoplastic chemotherapy: Secondary | ICD-10-CM | POA: Diagnosis not present

## 2017-04-15 DIAGNOSIS — C799 Secondary malignant neoplasm of unspecified site: Secondary | ICD-10-CM | POA: Diagnosis not present

## 2017-04-15 DIAGNOSIS — I10 Essential (primary) hypertension: Secondary | ICD-10-CM | POA: Diagnosis present

## 2017-04-15 DIAGNOSIS — D899 Disorder involving the immune mechanism, unspecified: Secondary | ICD-10-CM | POA: Diagnosis not present

## 2017-04-15 DIAGNOSIS — J189 Pneumonia, unspecified organism: Principal | ICD-10-CM

## 2017-04-15 DIAGNOSIS — Z79899 Other long term (current) drug therapy: Secondary | ICD-10-CM

## 2017-04-15 DIAGNOSIS — D630 Anemia in neoplastic disease: Secondary | ICD-10-CM | POA: Diagnosis present

## 2017-04-15 DIAGNOSIS — E785 Hyperlipidemia, unspecified: Secondary | ICD-10-CM | POA: Diagnosis present

## 2017-04-15 DIAGNOSIS — E871 Hypo-osmolality and hyponatremia: Secondary | ICD-10-CM | POA: Diagnosis not present

## 2017-04-15 DIAGNOSIS — C7951 Secondary malignant neoplasm of bone: Secondary | ICD-10-CM | POA: Diagnosis not present

## 2017-04-15 DIAGNOSIS — R079 Chest pain, unspecified: Secondary | ICD-10-CM | POA: Diagnosis not present

## 2017-04-15 DIAGNOSIS — C50919 Malignant neoplasm of unspecified site of unspecified female breast: Secondary | ICD-10-CM

## 2017-04-15 DIAGNOSIS — R531 Weakness: Secondary | ICD-10-CM | POA: Diagnosis not present

## 2017-04-15 DIAGNOSIS — Y95 Nosocomial condition: Secondary | ICD-10-CM | POA: Diagnosis present

## 2017-04-15 DIAGNOSIS — G92 Toxic encephalopathy: Secondary | ICD-10-CM | POA: Diagnosis not present

## 2017-04-15 DIAGNOSIS — C50511 Malignant neoplasm of lower-outer quadrant of right female breast: Secondary | ICD-10-CM | POA: Diagnosis not present

## 2017-04-15 DIAGNOSIS — R0602 Shortness of breath: Secondary | ICD-10-CM | POA: Diagnosis not present

## 2017-04-15 DIAGNOSIS — Z886 Allergy status to analgesic agent status: Secondary | ICD-10-CM

## 2017-04-15 DIAGNOSIS — K219 Gastro-esophageal reflux disease without esophagitis: Secondary | ICD-10-CM | POA: Diagnosis present

## 2017-04-15 DIAGNOSIS — E222 Syndrome of inappropriate secretion of antidiuretic hormone: Secondary | ICD-10-CM | POA: Diagnosis present

## 2017-04-15 HISTORY — DX: Pneumonia, unspecified organism: J18.9

## 2017-04-15 LAB — BASIC METABOLIC PANEL
Anion gap: 10 (ref 5–15)
BUN: 14 mg/dL (ref 6–20)
CO2: 27 mmol/L (ref 22–32)
Calcium: 10 mg/dL (ref 8.9–10.3)
Chloride: 74 mmol/L — ABNORMAL LOW (ref 101–111)
Creatinine, Ser: 0.65 mg/dL (ref 0.44–1.00)
GFR calc Af Amer: >60 mL/min (ref >60)
GFR calc non Af Amer: >60 mL/min (ref >60)
Glucose, Bld: 125 mg/dL — ABNORMAL HIGH (ref 65–99)
POTASSIUM: 3.4 mmol/L — AB (ref 3.5–5.1)
Sodium: 111 mmol/L — CL (ref 135–145)

## 2017-04-15 LAB — CBC
HCT: 30.3 % — ABNORMAL LOW (ref 36.0–46.0)
Hemoglobin: 11.5 g/dL — ABNORMAL LOW (ref 12.0–15.0)
MCH: 35.1 pg — ABNORMAL HIGH (ref 26.0–34.0)
MCHC: 36.5 g/dL — ABNORMAL HIGH (ref 30.0–36.0)
MCV: 92.1 fL (ref 78.0–100.0)
Platelets: 267 10*3/uL (ref 150–400)
RBC: 3.29 MIL/uL — ABNORMAL LOW (ref 3.87–5.11)
RDW: 13.3 % (ref 11.5–15.5)
WBC: 2.6 10*3/uL — AB (ref 4.0–10.5)

## 2017-04-15 LAB — DIFFERENTIAL
BASOS ABS: 0 10*3/uL (ref 0.0–0.1)
BASOS PCT: 1 %
EOS PCT: 2 %
Eosinophils Absolute: 0.1 10*3/uL (ref 0.0–0.7)
Lymphocytes Relative: 38 %
Lymphs Abs: 1.1 10*3/uL (ref 0.7–4.0)
MONOS PCT: 4 %
Monocytes Absolute: 0.1 10*3/uL (ref 0.1–1.0)
NEUTROS PCT: 55 %
Neutro Abs: 1.6 10*3/uL — ABNORMAL LOW (ref 1.7–7.7)

## 2017-04-15 LAB — TROPONIN I
Troponin I: <0.03 ng/mL (ref <0.03)
Troponin I: <0.03 ng/mL (ref <0.03)

## 2017-04-15 LAB — I-STAT TROPONIN, ED: Troponin i, poc: 0.02 ng/mL (ref 0.00–0.08)

## 2017-04-15 MED ORDER — MORPHINE SULFATE (PF) 4 MG/ML IV SOLN
4.0000 mg | Freq: Once | INTRAVENOUS | Status: AC
  Administered 2017-04-15: 4 mg via INTRAVENOUS
  Filled 2017-04-15: qty 1

## 2017-04-15 MED ORDER — ONDANSETRON 4 MG PO TBDP
4.0000 mg | ORAL_TABLET | Freq: Once | ORAL | Status: AC
  Administered 2017-04-15: 4 mg via ORAL
  Filled 2017-04-15: qty 1

## 2017-04-15 MED ORDER — LOSARTAN POTASSIUM-HCTZ 100-25 MG PO TABS: 1.0000 | ORAL_TABLET | Freq: Every day | ORAL | Status: DC

## 2017-04-15 MED ORDER — DEXTROSE 5 % IV SOLN
1.0000 g | Freq: Three times a day (TID) | INTRAVENOUS | Status: DC
  Administered 2017-04-15 – 2017-04-18 (×9): 1 g via INTRAVENOUS
  Filled 2017-04-15 (×11): qty 1

## 2017-04-15 MED ORDER — SIMVASTATIN 40 MG PO TABS: 40.0000 mg | ORAL_TABLET | Freq: Every day | ORAL | Status: DC

## 2017-04-15 MED ORDER — DEXTROSE 5 % IV SOLN
INTRAVENOUS | Status: DC
  Administered 2017-04-15 (×2): via INTRAVENOUS

## 2017-04-15 MED ORDER — BENZONATATE 100 MG PO CAPS: 100.0000 mg | ORAL_CAPSULE | Freq: Three times a day (TID) | ORAL | Status: DC | PRN

## 2017-04-15 MED ORDER — TRIAMCINOLONE ACETONIDE 0.1 % MT PSTE
1.0000 "application " | PASTE | Freq: Two times a day (BID) | OROMUCOSAL | Status: DC
  Administered 2017-04-18 – 2017-04-22 (×6): 1 via OROMUCOSAL
  Filled 2017-04-15 (×2): qty 5

## 2017-04-15 MED ORDER — ENOXAPARIN SODIUM 40 MG/0.4ML ~~LOC~~ SOLN
40.0000 mg | SUBCUTANEOUS | Status: DC
  Administered 2017-04-15 – 2017-04-17 (×3): 40 mg via SUBCUTANEOUS
  Filled 2017-04-15 (×3): qty 0.4

## 2017-04-15 MED ORDER — CLOBETASOL PROPIONATE 0.05 % EX CREA
1.0000 "application " | TOPICAL_CREAM | Freq: Two times a day (BID) | CUTANEOUS | Status: DC
  Administered 2017-04-15 – 2017-04-21 (×8): 1 via TOPICAL
  Filled 2017-04-15 (×2): qty 15

## 2017-04-15 MED ORDER — VANCOMYCIN HCL IN DEXTROSE 1-5 GM/200ML-% IV SOLN
1000.0000 mg | Freq: Once | INTRAVENOUS | Status: AC
  Administered 2017-04-15: 1000 mg via INTRAVENOUS
  Filled 2017-04-15: qty 200

## 2017-04-15 MED ORDER — VANCOMYCIN HCL 500 MG IV SOLR
500.0000 mg | Freq: Two times a day (BID) | INTRAVENOUS | Status: DC
  Administered 2017-04-15 – 2017-04-17 (×4): 500 mg via INTRAVENOUS
  Filled 2017-04-15 (×6): qty 500

## 2017-04-15 MED ORDER — SODIUM CHLORIDE 0.9 % IV BOLUS (SEPSIS)
500.0000 mL | Freq: Once | INTRAVENOUS | Status: AC
  Administered 2017-04-15: 500 mL via INTRAVENOUS

## 2017-04-15 MED ORDER — HYDROCHLOROTHIAZIDE 25 MG PO TABS: 25.0000 mg | ORAL_TABLET | Freq: Every day | ORAL | Status: DC

## 2017-04-15 MED ORDER — LABETALOL HCL 200 MG PO TABS
200.0000 mg | ORAL_TABLET | Freq: Two times a day (BID) | ORAL | Status: DC
  Administered 2017-04-15 – 2017-04-23 (×16): 200 mg via ORAL
  Filled 2017-04-15 (×16): qty 1

## 2017-04-15 MED ORDER — LOSARTAN POTASSIUM 50 MG PO TABS
100.0000 mg | ORAL_TABLET | Freq: Every day | ORAL | Status: DC
  Administered 2017-04-16 – 2017-04-17 (×2): 100 mg via ORAL
  Filled 2017-04-15 (×2): qty 2

## 2017-04-15 MED ORDER — CEFEPIME HCL 1 G IJ SOLR
1.0000 g | Freq: Once | INTRAMUSCULAR | Status: AC
  Administered 2017-04-15: 1 g via INTRAVENOUS
  Filled 2017-04-15: qty 1

## 2017-04-15 MED ORDER — PALBOCICLIB 75 MG PO CAPS: 75.0000 mg | ORAL_CAPSULE | Freq: Every day | ORAL | Status: DC

## 2017-04-15 MED ORDER — DM-GUAIFENESIN ER 30-600 MG PO TB12
2.0000 | ORAL_TABLET | Freq: Two times a day (BID) | ORAL | Status: DC
  Administered 2017-04-15 – 2017-04-23 (×16): 2 via ORAL
  Filled 2017-04-15 (×16): qty 2
  Filled 2017-04-15: qty 1

## 2017-04-15 MED ORDER — TRIAMCINOLONE ACETONIDE 0.1 % EX CREA
TOPICAL_CREAM | Freq: Two times a day (BID) | CUTANEOUS | Status: DC
  Administered 2017-04-18 – 2017-04-22 (×5): via TOPICAL
  Filled 2017-04-15 (×2): qty 15

## 2017-04-15 MED ORDER — POTASSIUM CHLORIDE CRYS ER 20 MEQ PO TBCR
40.0000 meq | EXTENDED_RELEASE_TABLET | Freq: Once | ORAL | Status: AC
  Administered 2017-04-15: 40 meq via ORAL
  Filled 2017-04-15: qty 2

## 2017-04-15 MED ORDER — ONDANSETRON HCL 4 MG/2ML IJ SOLN
4.0000 mg | Freq: Four times a day (QID) | INTRAMUSCULAR | Status: DC | PRN
  Administered 2017-04-15 (×2): 4 mg via INTRAVENOUS
  Filled 2017-04-15 (×2): qty 2

## 2017-04-15 MED ORDER — LETROZOLE 2.5 MG PO TABS
2.5000 mg | ORAL_TABLET | Freq: Every day | ORAL | Status: DC
  Administered 2017-04-15 – 2017-04-23 (×9): 2.5 mg via ORAL
  Filled 2017-04-15 (×10): qty 1

## 2017-04-15 MED ORDER — LORAZEPAM 2 MG/ML IJ SOLN
0.2500 mg | Freq: Once | INTRAMUSCULAR | Status: AC
  Administered 2017-04-15: 0.25 mg via INTRAVENOUS
  Filled 2017-04-15: qty 1

## 2017-04-15 MED ORDER — ACETAMINOPHEN 325 MG PO TABS
650.0000 mg | ORAL_TABLET | Freq: Four times a day (QID) | ORAL | Status: DC | PRN
Start: 2017-04-15 — End: 2017-04-23
  Administered 2017-04-16 – 2017-04-19 (×4): 650 mg via ORAL
  Filled 2017-04-15 (×5): qty 2

## 2017-04-15 NOTE — H&P (Signed)
History and Physical    DAILY DOE IWL:[id number] DOB: 1946-03-18 DOA: 04/15/2017  Referring MD/NP/PA: Dr. Canary Brim   PCP: [name], MD   Patient coming from: Home   Chief Complaint: cough  HPI: Jessica Zavala is a 71 y.o. female with known breast cancer on oral tx, presented to [facility] with main concern of almost now one week in duration progressively worsening productive cough of yellow sputum that is associated with exertional dyspnea and chest discomfort that is only present with coughing spells. Pt reports subjective fevers as well, nausea and poor oral intake. She was seen in urgent care few days prior to this admission and was given mucinex, sent home but she continued to get worse. No specific abd or urinary concerns.  ED Course: Pt is hemodynamically stable, VSS, blood work notable for Na 11, WBC 2.6, imaging studies worrisome for PNA, TRH asked to admit for further evaluation.   Review of Systems:  Constitutional: Positive for fever, chills, activity change, appetite change and fatigue.  HENT: Negative for ear pain, nosebleeds, congestion, facial swelling, rhinorrhea, neck pain, neck stiffness and ear discharge.   Eyes: Negative for pain, discharge, redness, itching and visual disturbance.  Respiratory: Negative for  wheezing and stridor.   Cardiovascular: Negative for palpitations and leg swelling.  Gastrointestinal: Negative for abdominal distention.  Genitourinary: Negative for dysuria, urgency, frequency, hematuria, flank pain, decreased urine volume, difficulty urinating and dyspareunia.  Musculoskeletal: Negative for back pain, joint swelling, arthralgias and gait problem.  Neurological: Negative for dizziness, tremors, seizures, syncope, facial asymmetry, speech difficulty, weakness, light-headedness, numbness and headaches.  Hematological: Negative for adenopathy. Does not bruise/bleed easily.  Psychiatric/Behavioral: Negative for hallucinations, behavioral problems,  confusion, dysphoric mood, decreased concentration and agitation.   Past Medical History:  Diagnosis Date  . Allergy   . Cancer ([place])    Right side, sp lumpectomy, rxt and chemo  . High cholesterol   . Hyperlipidemia   . Hypertension     Past Surgical History:  Procedure Laterality Date  . BREAST SURGERY     Social Hx:  reports that she has never smoked. She has never used smokeless tobacco. She reports that she does not drink alcohol or use drugs.  Allergies  Allergen Reactions  . Aspirin Swelling    Mouth and facial swelling    No family history of cancers.   Medication Sig  acetaminophen (TYLENOL) 325 MG tablet Take 650 mg by mouth every 6 (six) hours as needed for mild pain.  benzonatate (TESSALON) 100 MG capsule Take 1-2 capsules (100-200 mg total) by mouth 3 (three) times daily as needed for cough.  clobetasol cream (TEMOVATE) 1.74 % Apply 1 application topically 2 (two) times daily as needed.  cromolyn (OPTICROM) 4 % ophthalmic solution Place 1 drop into the right eye 4 (four) times daily. Reported on 06/24/2016  fluorometholone (FML) 0.1 % ophthalmic suspension Reported on 06/24/2016  IBRANCE 75 MG capsule TAKE 1 CAPSULE BY MOUTH ONCE DAILY  labetalol (NORMODYNE) 200 MG tablet Take 1 tablet (200 mg total) by mouth 2 (two) times daily.  letrozole (FEMARA) 2.5 MG tablet Take 1 tablet (2.5 mg total) by mouth daily.  losartan-hydrochlorothiazide (HYZAAR) 100-25 MG tablet Take 1 tablet by mouth daily.  palbociclib (IBRANCE) 75 MG capsule Take 1 capsule (75 mg total) by mouth daily with breakfast. 2 weeks on 2 weeks off  simvastatin (ZOCOR) 40 MG tablet Take 40 mg by mouth daily. Reported on 06/24/2016  triamcinolone (KENALOG) 0.1 % paste  Use as directed 1 application in the mouth or throat 2 (two) times daily.  triamcinolone cream (KENALOG) 0.1 % APPLY TO AFFECTED AREA TWICE DAILY    Physical Exam: Vitals:   04/15/17 0408 04/15/17 0548 04/15/17 0717  BP: (!) 180/97 (!)  162/75 (!) 146/82  Pulse: 80 79 79  Resp: 18 13 16   Temp: 97.6 F (36.4 C)    TempSrc: Oral    SpO2: 97% 98% 92%  Weight: 54.4 kg (120 lb)    Height: 5' (1.524 m)      Constitutional: NAD, calm, comfortable Vitals:   04/15/17 0408 04/15/17 0548 04/15/17 0717  BP: (!) 180/97 (!) 162/75 (!) 146/82  Pulse: 80 79 79  Resp: 18 13 16   Temp: 97.6 F (36.4 C)    TempSrc: Oral    SpO2: 97% 98% 92%  Weight: 54.4 kg (120 lb)    Height: 5' (1.524 m)     Eyes: PERRL, lids and conjunctivae normal ENMT: Mucous membranes are dry. Posterior pharynx clear of any exudate or lesions.Normal dentition.  Neck: normal, supple, no masses, no thyromegaly Respiratory: Normal respiratory effort. No accessory muscle use. Mild rhonchi at bases  Cardiovascular: Regular rate and rhythm, no murmurs / rubs / gallops. No extremity edema. 2+ pedal pulses. No carotid bruits.  Abdomen: no tenderness, no masses palpated. No hepatosplenomegaly. Bowel sounds positive.  Musculoskeletal: no clubbing / cyanosis. No joint deformity upper and lower extremities. Good ROM, no contractures. Normal muscle tone.  Skin: no rashes, lesions, ulcers. No induration Neurologic: CN 2-12 grossly intact. Sensation intact, DTR normal. Strength 5/5 in all 4.  Psychiatric: Normal judgment and insight. Alert and oriented x 3. Normal mood.   Labs on Admission: I have personally reviewed following labs and imaging studies  CBC:  Recent Labs Lab 04/15/17 0438  WBC 2.6*  HGB 11.5*  HCT 30.3*  MCV 92.1  PLT 176   Basic Metabolic Panel:  Recent Labs Lab 04/15/17 0438  NA 111*  K 3.4*  CL 74*  CO2 27  GLUCOSE 125*  BUN 14  CREATININE 0.65  CALCIUM 10.0   Urine analysis:    Component Value Date/Time   COLORURINE RED (A) 12/20/2015 0452   APPEARANCEUR TURBID (A) 12/20/2015 0452   LABSPEC 1.014 12/20/2015 0452   PHURINE 6.5 12/20/2015 0452   GLUCOSEU NEGATIVE 12/20/2015 0452   HGBUR LARGE (A) 12/20/2015 0452    BILIRUBINUR NEGATIVE 12/20/2015 0452   BILIRUBINUR NEG 12/23/2014 1902   KETONESUR NEGATIVE 12/20/2015 0452   PROTEINUR 100 (A) 12/20/2015 0452   UROBILINOGEN 0.2 12/23/2014 1902   NITRITE NEGATIVE 12/20/2015 0452   LEUKOCYTESUR SMALL (A) 12/20/2015 0452   Radiological Exams on Admission: Dg Chest 2 View Result Date: 04/15/2017 Infiltration suggested behind the heart in the left lung base, possibly pneumonia.   EKG: pending   Assessment/Plan Active Problems: LLL PNA, HCAP - agree with HCAP coverage given immunocompromised state - PNA order set in place - maxipime and vancomycin started - follow up on sputum cx, urine legionella and strep pneumo  Hyponatremia - started on D5W - BMP in AM  Hypertension - continue home medical regimen   HLD - continue statin per home medical regimen   Breast cancer - will notify oncologist pf pt'a admission, Dr. Lindi Adie      DVT prophylaxis: Lovenox SQ Code Status: Full Family Communication: Pt and husband updated at bedside Disposition Plan: will go home  Consults called: None Admission status: Inpatient   Faye Ramsay MD [facility] Pager [phone]  If 7PM-7AM, please contact night-coverage [email or web address] Password [facility]  04/15/2017, 8:27 AM

## 2017-04-15 NOTE — ED Notes (Signed)
Bed: RESA Expected date:  Expected time:  Means of arrival:  Comments: TR 5

## 2017-04-15 NOTE — ED Triage Notes (Signed)
Pt c/o cp and sob that started Wednesday and persists despite being seen and treated at urgent care for URI s/s.

## 2017-04-15 NOTE — Progress Notes (Signed)
Pharmacy Antibiotic Note  Jessica Zavala is a 71 y.o. female with recurrent metastatic breast cancer on Ibrance PTA, presented to the ED on  04/15/2017 with c/o Cp and SOB.  CXR showed infiltrates with suspicion for PNA.  To start broad abx with vancomycin and cefepime.  - afeb, wbc low, scr 0.65 (crcl~50), ANC 1.6   Plan: - cefepime 1 gm IV q8h per MD - vancomycin 1000 mg iv x1 per MD, then 500 mg IV q12h _______________________________  Height: 5' (152.4 cm) Weight: 123 lb 3.8 oz (55.9 kg) IBW/kg (Calculated) : 45.5  Temp (24hrs), Avg:97.9 F (36.6 C), Min:97.6 F (36.4 C), Max:98.3 F (36.8 C)   Recent Labs Lab 04/15/17 0438  WBC 2.6*  CREATININE 0.65    Estimated Creatinine Clearance: 49.9 mL/min (by C-G formula based on SCr of 0.65 mg/dL).    Allergies  Allergen Reactions  . Aspirin Swelling    Mouth and facial swelling     Thank you for allowing pharmacy to be a part of this patient's care.  Lynelle Doctor 04/15/2017 10:51 AM

## 2017-04-15 NOTE — ED Notes (Signed)
Date and time results received: 04/15/17 5:15 AM  (use smartphrase ".now" to insert current time)  Test: NA Critical Value:111  Name of Provider Notified: POLLINA  Orders Received? Or Actions Taken?:NONE

## 2017-04-15 NOTE — ED Provider Notes (Signed)
[place] DEPT Provider Note   CSN: [id number] Arrival date & time: 04/15/17  3007     History   Chief Complaint Chief Complaint  Patient presents with  . Chest Pain  . Shortness of Breath    HPI Jessica Zavala is a 71 y.o. female.  HPI  Pt with hx of metastatic breast cancer on current oral chemotherapy presenting with c/o cough, congestion, ongoing pain.  She was seen at urgent care for her symptoms several days ago and given mucinex.  Her symptoms have persisted, she is coughing up a lot of sputum.  Has continued to drink liquids well.  No vomiting.  No change in stools. No fever/chills.  There are no other associated systemic symptoms, there are no other alleviating or modifying factors.   Past Medical History:  Diagnosis Date  . Allergy   . Cancer ([place])    Right side, sp lumpectomy, rxt and chemo  . High cholesterol   . Hyperlipidemia   . Hypertension     Patient Active Problem List   Diagnosis Date Noted  . Bone metastases ([place]) 02/02/2016  . Breast cancer of lower-outer quadrant of right female breast ([place]) 01/05/2016  . Eczema of hand 11/15/2013  . Hypercholesterolemia 06/14/2012  . Hypertension 01/06/2012  . Eczema 01/06/2012    Past Surgical History:  Procedure Laterality Date  . BREAST SURGERY      OB History    No data available       Home Medications    Prior to Admission medications   Medication Sig Start Date End Date Taking? Authorizing Provider  acetaminophen (TYLENOL) 325 MG tablet Take 650 mg by mouth every 6 (six) hours as needed for mild pain.    [provider]  benzonatate (TESSALON) 100 MG capsule Take 1-2 capsules (100-200 mg total) by mouth 3 (three) times daily as needed for cough. 04/12/17   McVey, Gelene Mink, PA-C  clobetasol cream (TEMOVATE) 6.22 % Apply 1 application topically 2 (two) times daily as needed. 03/03/17   McVey, Gelene Mink, PA-C  cromolyn (OPTICROM) 4 % ophthalmic solution Place 1 drop into  the right eye 4 (four) times daily. Reported on 06/24/2016 12/18/15   [provider]  Dextromethorphan-Guaifenesin (MUCINEX DM MAXIMUM STRENGTH) 60-1200 MG TB12 Take 1 tablet by mouth every 12 (twelve) hours. 04/12/17   McVey, Gelene Mink, PA-C  fluorometholone (FML) 0.1 % ophthalmic suspension Reported on 06/24/2016 12/21/15   [provider]  IBRANCE 75 MG capsule TAKE 1 CAPSULE BY MOUTH ONCE DAILY 03/31/17   [name], MD  labetalol (NORMODYNE) 200 MG tablet Take 1 tablet (200 mg total) by mouth 2 (two) times daily. 03/27/17   McVey, Gelene Mink, PA-C  letrozole [facility]) 2.5 MG tablet Take 1 tablet (2.5 mg total) by mouth daily. 02/06/17   [name], MD  losartan-hydrochlorothiazide (HYZAAR) 100-25 MG tablet Take 1 tablet by mouth daily. 02/27/17   McVey, Gelene Mink, PA-C  Menthol, Topical Analgesic, (BIOFREEZE COLORLESS) 4 % GEL Use in affected area TID prn 10/08/15   Le, Thao P, DO  Omega-3 Fatty Acids (FISH OIL PO) Take 1 capsule by mouth daily.    [provider]  palbociclib [facility]) 75 MG capsule Take 1 capsule (75 mg total) by mouth daily with breakfast. 2 weeks on 2 weeks off 12/09/16   [name], MD  simvastatin (ZOCOR) 40 MG tablet Take 40 mg by mouth daily. Reported on 06/24/2016    [provider]  triamcinolone (KENALOG) 0.1 %  paste Use as directed 1 application in the mouth or throat 2 (two) times daily. 06/24/16   [name] D, PA  triamcinolone cream (KENALOG) 0.1 % APPLY TO AFFECTED AREA TWICE DAILY 03/27/17   McVey, Gelene Mink, PA-C    Family History History reviewed. No pertinent family history.  Social History Social History  Substance Use Topics  . Smoking status: Never Smoker  . Smokeless tobacco: Never Used  . Alcohol use No     Allergies   Aspirin   Review of Systems Review of Systems  ROS reviewed and all otherwise negative except for mentioned in HPI   Physical Exam Updated Vital  Signs BP (!) 146/82   Pulse 79   Temp 97.6 F (36.4 C) (Oral)   Resp 16   Ht 5' (1.524 m)   Wt 120 lb (54.4 kg)   SpO2 92%   BMI 23.44 kg/m  Vitals reviewed Physical Exam Physical Examination: General appearance - alert, well appearing, and in no distress Mental status - alert, oriented to person, place, and time Eyes - no conjunctival injection, no scleral icterus Mouth - mucous membranes moist, pharynx normal without lesions Neck - supple, no significant adenopathy Chest - clear to auscultation, no wheezes, rales or rhonchi, symmetric air entry Heart - normal rate, regular rhythm, normal S1, S2, no murmurs, rubs, clicks or gallops Abdomen - soft, nontender, nondistended, no masses or organomegaly Neurological - alert, oriented, normal speech Extremities - peripheral pulses normal, no pedal edema, no clubbing or cyanosis Skin - normal coloration and turgor, no rashes  ED Treatments / Results  Labs (all labs ordered are listed, but only abnormal results are displayed) Labs Reviewed  BASIC METABOLIC PANEL - Abnormal; Notable for the following:       Result Value   Sodium 111 (*)    Potassium 3.4 (*)    Chloride 74 (*)    Glucose, Bld 125 (*)    All other components within normal limits  CBC - Abnormal; Notable for the following:    WBC 2.6 (*)    RBC 3.29 (*)    Hemoglobin 11.5 (*)    HCT 30.3 (*)    MCH 35.1 (*)    MCHC 36.5 (*)    All other components within normal limits  CULTURE, BLOOD (ROUTINE X 2)  CULTURE, BLOOD (ROUTINE X 2)  DIFFERENTIAL  I-STAT TROPOININ, ED    EKG  EKG Interpretation  Date/Time:  Saturday Apr 15 2017 04:29:23 EDT Ventricular Rate:  75 PR Interval:    QRS Duration: 99 QT Interval:  435 QTC Calculation: 486 R Axis:   -30 Text Interpretation:  Sinus rhythm Left axis deviation Low voltage, precordial leads Anteroseptal infarct, old Baseline wander in lead(s) V2 No significant change since last tracing Confirmed by POLLINA  MD,  CHRISTOPHER [phone]) on 04/15/2017 5:37:17 AM       Radiology Dg Chest 2 View  Result Date: 04/15/2017 CLINICAL DATA:  Chest pain and shortness of breath starting Wednesday. Patient was seen and treated for upper respiratory infection at urgent care center. Hypertension. Nonsmoker. EXAM: CHEST  2 VIEW COMPARISON:  CT chest 11/03/2016.  Chest 12/10/2014 FINDINGS: There is suggestion of focal opacification of the left lung base behind the heart which may indicate focal pneumonia. Right lung is clear and expanded. Normal heart size and pulmonary vascularity. Calcified and tortuous aorta. No pleural effusions. No pneumothorax. Surgical clips in the right axilla. IMPRESSION: Infiltration suggested behind the heart in the left lung base,  possibly pneumonia. Electronically Signed   By: Lucienne Capers M.D.   On: 04/15/2017 04:48    Procedures Procedures (including critical care time)  Medications Ordered in ED Medications  vancomycin (VANCOCIN) IVPB 1000 mg/200 mL premix (not administered)  ceFEPIme (MAXIPIME) 1 g in dextrose 5 % 50 mL IVPB (not administered)  sodium chloride 0.9 % bolus 500 mL (not administered)  morphine 4 MG/ML injection 4 mg (not administered)  ondansetron (ZOFRAN-ODT) disintegrating tablet 4 mg (not administered)     Initial Impression / Assessment and Plan / ED Course  I have reviewed the triage vital signs and the nursing notes.  Pertinent labs & imaging results that were available during my care of the patient were reviewed by me and considered in my medical decision making (see chart for details).    8:20 AM d/w Dr. Doyle Askew, [facility] for [facility].  Workup reveals significan hyponatremia as well as pneumonia on cxr.  Pt is started on abx for HCAP. Hyponatremia may be paraneoplastic or siadh.  Pt treated with IV fluids and abx in the ED, admitted to trial for further management.    Final Clinical Impressions(s) / ED Diagnoses   Final diagnoses:  HCAP  (healthcare-associated pneumonia)  Metastatic breast cancer ([place])  Hyponatremia    New Prescriptions New Prescriptions   No medications on file     Alfonzo Beers, MD 04/18/17 [phone]

## 2017-04-15 NOTE — ED Notes (Signed)
Bed: WTR5 Expected date:  Expected time:  Means of arrival:  Comments: 

## 2017-04-15 NOTE — ED Notes (Signed)
Patient transported to X-ray 

## 2017-04-16 DIAGNOSIS — C50511 Malignant neoplasm of lower-outer quadrant of right female breast: Secondary | ICD-10-CM

## 2017-04-16 DIAGNOSIS — D6481 Anemia due to antineoplastic chemotherapy: Secondary | ICD-10-CM

## 2017-04-16 DIAGNOSIS — J189 Pneumonia, unspecified organism: Principal | ICD-10-CM

## 2017-04-16 DIAGNOSIS — C7951 Secondary malignant neoplasm of bone: Secondary | ICD-10-CM

## 2017-04-16 LAB — CBC
HEMATOCRIT: 27 % — AB (ref 36.0–46.0)
Hemoglobin: 10.6 g/dL — ABNORMAL LOW (ref 12.0–15.0)
MCH: 34.9 pg — ABNORMAL HIGH (ref 26.0–34.0)
MCHC: 39.3 g/dL — ABNORMAL HIGH (ref 30.0–36.0)
MCV: 88.8 fL (ref 78.0–100.0)
PLATELETS: 240 10*3/uL (ref 150–400)
RBC: 3.04 MIL/uL — ABNORMAL LOW (ref 3.87–5.11)
RDW: 13.5 % (ref 11.5–15.5)
WBC: 3.2 10*3/uL — AB (ref 4.0–10.5)

## 2017-04-16 LAB — BASIC METABOLIC PANEL
Anion gap: 10 (ref 5–15)
BUN: 11 mg/dL (ref 6–20)
CO2: 25 mmol/L (ref 22–32)
Calcium: 7.8 mg/dL — ABNORMAL LOW (ref 8.9–10.3)
Chloride: 68 mmol/L — ABNORMAL LOW (ref 101–111)
Creatinine, Ser: 0.56 mg/dL (ref 0.44–1.00)
GFR calc Af Amer: >60 mL/min (ref >60)
Glucose, Bld: 143 mg/dL — ABNORMAL HIGH (ref 65–99)
Potassium: 3.6 mmol/L (ref 3.5–5.1)
SODIUM: 103 mmol/L — AB (ref 135–145)

## 2017-04-16 LAB — SODIUM, URINE, RANDOM: SODIUM UR: 86 mmol/L

## 2017-04-16 LAB — OSMOLALITY
OSMOLALITY: 217 mosm/kg — AB (ref 275–295)
Osmolality: 220 mOsm/kg — CL (ref 275–295)

## 2017-04-16 LAB — TSH: TSH: 1.371 u[IU]/mL (ref 0.350–4.500)

## 2017-04-16 LAB — SODIUM
SODIUM: 103 mmol/L — AB (ref 135–145)
SODIUM: 110 mmol/L — AB (ref 135–145)
SODIUM: 111 mmol/L — AB (ref 135–145)
Sodium: 105 mmol/L — CL (ref 135–145)
Sodium: 106 mmol/L — CL (ref 135–145)
Sodium: 107 mmol/L — CL (ref 135–145)

## 2017-04-16 LAB — HIV ANTIBODY (ROUTINE TESTING W REFLEX): HIV Screen 4th Generation wRfx: NONREACTIVE

## 2017-04-16 LAB — OSMOLALITY, URINE: OSMOLALITY UR: 568 mosm/kg (ref 300–900)

## 2017-04-16 LAB — STREP PNEUMONIAE URINARY ANTIGEN: Strep Pneumo Urinary Antigen: NEGATIVE

## 2017-04-16 LAB — CORTISOL-AM, BLOOD: Cortisol - AM: 44 ug/dL — ABNORMAL HIGH (ref 6.7–22.6)

## 2017-04-16 MED ORDER — SODIUM CHLORIDE 3 % IV SOLN
INTRAVENOUS | Status: DC
  Administered 2017-04-16: 75 mL/h via INTRAVENOUS
  Filled 2017-04-16: qty 500

## 2017-04-16 MED ORDER — PANTOPRAZOLE SODIUM 40 MG IV SOLR
40.0000 mg | Freq: Two times a day (BID) | INTRAVENOUS | Status: DC
  Administered 2017-04-16 – 2017-04-18 (×6): 40 mg via INTRAVENOUS
  Filled 2017-04-16 (×6): qty 40

## 2017-04-16 MED ORDER — SODIUM CHLORIDE 0.9 % IV SOLN
INTRAVENOUS | Status: DC
  Administered 2017-04-16: 23:00:00 via INTRAVENOUS

## 2017-04-16 MED ORDER — SODIUM CHLORIDE 3 % IV SOLN
INTRAVENOUS | Status: DC
  Administered 2017-04-16: 50 mL/h via INTRAVENOUS
  Filled 2017-04-16 (×5): qty 500

## 2017-04-16 NOTE — Consult Note (Signed)
[facility] Consult Note     Date: 04/16/2017                  Patient Name:  Jessica Zavala  MRN: [id number]  DOB: [birth year]  Age / Sex: 71 y.o., female         PCP: [name], MD                 Service Requesting Consult: [facility]/Dr. Verlon Au                 Reason for Consult: hyponatremia            Chief Complaint:  Cough  HPI: Pt is a 4F with recurrent metastatic breast Ca on oral therapy, HTN, HLD who is now seen in consultation at the request of Dr. Verlon Au for evaluation and management of hyponatremia.  Pt came from home with cough, found to have pneumonia, and placed on broad spectrum antibiotics.  Admit sodium was 111, down from a baseline of 126 (she is chronically hyponatremic).  She was given D5W overnight and sodium down to 103.  She became confused and is now in the ICU.  She has been started on hypertonic saline, current rate on the pump is 40m/ hr, prescribed rate 50 mL/ hr.  Past Medical History:  Diagnosis Date  . Allergy   . Cancer ([facility]    Right side, sp lumpectomy, rxt and chemo  . High cholesterol   . Hyperlipidemia   . Hypertension     Past Surgical History:  Procedure Laterality Date  . BREAST SURGERY      History reviewed. No pertinent family history. Social History:  reports that she has never smoked. She has never used smokeless tobacco. She reports that she does not drink alcohol or use drugs.  Allergies:  Allergies  Allergen Reactions  . Aspirin Swelling    Mouth and facial swelling    Medications Prior to Admission  Medication Sig Dispense Refill  . acetaminophen (TYLENOL) 325 MG tablet Take 650 mg by mouth every 6 (six) hours as needed for mild pain.    . benzonatate (TESSALON) 100 MG capsule Take 1-2 capsules (100-200 mg total) by mouth 3 (three) times daily as needed for cough. 40 capsule 0  . clobetasol cream (TEMOVATE) 02.64% Apply 1 application topically 2 (two) times daily as needed. 30 g 0  .  Dextromethorphan-Guaifenesin (MUCINEX DM MAXIMUM STRENGTH) 60-1200 MG TB12 Take 1 tablet by mouth every 12 (twelve) hours. 14 each 1  . labetalol (NORMODYNE) 200 MG tablet Take 1 tablet (200 mg total) by mouth 2 (two) times daily. 30 tablet 1  . letrozole (FEMARA) 2.5 MG tablet Take 1 tablet (2.5 mg total) by mouth daily. 90 tablet 3  . losartan-hydrochlorothiazide (HYZAAR) 100-25 MG tablet Take 1 tablet by mouth daily. 90 tablet 3  . palbociclib (IBRANCE) 75 MG capsule Take 1 capsule (75 mg total) by mouth daily with breakfast. 2 weeks on 2 weeks off 14 capsule 3  . simvastatin (ZOCOR) 40 MG tablet Take 40 mg by mouth daily. Reported on 06/24/2016    . triamcinolone (KENALOG) 0.1 % paste Use as directed 1 application in the mouth or throat 2 (two) times daily. 5 g 0  . triamcinolone cream (KENALOG) 0.1 % APPLY TO AFFECTED AREA TWICE DAILY 80 g 0  . IBRANCE 75 MG capsule TAKE 1 CAPSULE BY MOUTH ONCE DAILY (Patient not taking: Reported on 04/15/2017) 21 capsule 0  .  Menthol, Topical Analgesic, (BIOFREEZE COLORLESS) 4 % GEL Use in affected area TID prn (Patient not taking: Reported on 04/15/2017) 100 mL 0    Results for orders placed or performed during the hospital encounter of 04/15/17 (from the past 48 hour(s))  Basic metabolic panel     Status: Abnormal   Collection Time: 04/15/17  4:38 AM  Result Value Ref Range   Sodium 111 (LL) 135 - 145 mmol/L    Comment: CRITICAL RESULT CALLED TO, READ BACK BY AND VERIFIED WITHArnold Long RN 0515 04/15/17 A NAVARRO    Potassium 3.4 (L) 3.5 - 5.1 mmol/L   Chloride 74 (L) 101 - 111 mmol/L   CO2 27 22 - 32 mmol/L   Glucose, Bld 125 (H) 65 - 99 mg/dL   BUN 14 6 - 20 mg/dL   Creatinine, Ser 0.65 0.44 - 1.00 mg/dL   Calcium 10.0 8.9 - 10.3 mg/dL   GFR calc non Af Amer >60 >60 mL/min   GFR calc Af Amer >60 >60 mL/min    Comment: (NOTE) The eGFR has been calculated using the CKD EPI equation. This calculation has not been validated in all clinical  situations. eGFR's persistently <60 mL/min signify possible Chronic Kidney Disease.    Anion gap 10 5 - 15  CBC     Status: Abnormal   Collection Time: 04/15/17  4:38 AM  Result Value Ref Range   WBC 2.6 (L) 4.0 - 10.5 K/uL   RBC 3.29 (L) 3.87 - 5.11 MIL/uL   Hemoglobin 11.5 (L) 12.0 - 15.0 g/dL   HCT 30.3 (L) 36.0 - 46.0 %   MCV 92.1 78.0 - 100.0 fL   MCH 35.1 (H) 26.0 - 34.0 pg   MCHC 36.5 (H) 30.0 - 36.0 g/dL    Comment: RULED OUT INTERFERING SUBSTANCES   RDW 13.3 11.5 - 15.5 %   Platelets 267 150 - 400 K/uL  Differential     Status: Abnormal   Collection Time: 04/15/17  4:38 AM  Result Value Ref Range   Neutrophils Relative % 55 %   Neutro Abs 1.6 (L) 1.7 - 7.7 K/uL   Lymphocytes Relative 38 %   Lymphs Abs 1.1 0.7 - 4.0 K/uL   Monocytes Relative 4 %   Monocytes Absolute 0.1 0.1 - 1.0 K/uL   Eosinophils Relative 2 %   Eosinophils Absolute 0.1 0.0 - 0.7 K/uL   Basophils Relative 1 %   Basophils Absolute 0.0 0.0 - 0.1 K/uL  I-stat troponin, ED     Status: None   Collection Time: 04/15/17  4:48 AM  Result Value Ref Range   Troponin i, poc 0.02 0.00 - 0.08 ng/mL   Comment 3            Comment: Due to the release kinetics of cTnI, a negative result within the first hours of the onset of symptoms does not rule out myocardial infarction with certainty. If myocardial infarction is still suspected, repeat the test at appropriate intervals.   Strep pneumoniae urinary antigen     Status: None   Collection Time: 04/15/17 10:39 AM  Result Value Ref Range   Strep Pneumo Urinary Antigen NEGATIVE NEGATIVE    Comment:        Infection due to S. pneumoniae cannot be absolutely ruled out since the antigen present may be below the detection limit of the test.   Troponin I (q 6hr x 3)     Status: None   Collection Time: 04/15/17 11:10  AM  Result Value Ref Range   Troponin I <0.03 <0.03 ng/mL  Troponin I (q 6hr x 3)     Status: None   Collection Time: 04/15/17  4:51 PM   Result Value Ref Range   Troponin I <0.03 <0.03 ng/mL  Troponin I (q 6hr x 3)     Status: None   Collection Time: 04/15/17 10:32 PM  Result Value Ref Range   Troponin I <0.03 <0.03 ng/mL  Basic metabolic panel     Status: Abnormal   Collection Time: 04/16/17  5:57 AM  Result Value Ref Range   Sodium 103 (LL) 135 - 145 mmol/L    Comment: DELTA CHECK NOTED REPEATED TO VERIFY CRITICAL RESULT CALLED TO, READ BACK BY AND VERIFIED WITH: HOLT,B. CHARGE RN AT 4270 04/16/17 MULLINS,T    Potassium 3.6 3.5 - 5.1 mmol/L   Chloride 68 (L) 101 - 111 mmol/L   CO2 25 22 - 32 mmol/L   Glucose, Bld 143 (H) 65 - 99 mg/dL   BUN 11 6 - 20 mg/dL   Creatinine, Ser 0.56 0.44 - 1.00 mg/dL   Calcium 7.8 (L) 8.9 - 10.3 mg/dL    Comment: DELTA CHECK NOTED REPEATED TO VERIFY    GFR calc non Af Amer >60 >60 mL/min   GFR calc Af Amer >60 >60 mL/min    Comment: (NOTE) The eGFR has been calculated using the CKD EPI equation. This calculation has not been validated in all clinical situations. eGFR's persistently <60 mL/min signify possible Chronic Kidney Disease.    Anion gap 10 5 - 15  CBC     Status: Abnormal   Collection Time: 04/16/17  5:57 AM  Result Value Ref Range   WBC 3.2 (L) 4.0 - 10.5 K/uL   RBC 3.04 (L) 3.87 - 5.11 MIL/uL   Hemoglobin 10.6 (L) 12.0 - 15.0 g/dL   HCT 27.0 (L) 36.0 - 46.0 %   MCV 88.8 78.0 - 100.0 fL   MCH 34.9 (H) 26.0 - 34.0 pg   MCHC 39.3 (H) 30.0 - 36.0 g/dL    Comment: RULED OUT INTERFERING SUBSTANCES   RDW 13.5 11.5 - 15.5 %   Platelets 240 150 - 400 K/uL  TSH     Status: None   Collection Time: 04/16/17  7:52 AM  Result Value Ref Range   TSH 1.371 0.350 - 4.500 uIU/mL    Comment: Performed by a 3rd Generation assay with a functional sensitivity of <=0.01 uIU/mL.  Sodium     Status: Abnormal   Collection Time: 04/16/17 10:22 AM  Result Value Ref Range   Sodium 103 (LL) 135 - 145 mmol/L    Comment: CRITICAL RESULT CALLED TO, READ BACK BY AND VERIFIED  WITH: JOHNSON,R AT 11:05AM ON 04/16/17 BY [facility]    Dg Chest 2 View  Result Date: 04/15/2017 CLINICAL DATA:  Chest pain and shortness of breath starting Wednesday. Patient was seen and treated for upper respiratory infection at urgent care center. Hypertension. Nonsmoker. EXAM: CHEST  2 VIEW COMPARISON:  CT chest 11/03/2016.  Chest 12/10/2014 FINDINGS: There is suggestion of focal opacification of the left lung base behind the heart which may indicate focal pneumonia. Right lung is clear and expanded. Normal heart size and pulmonary vascularity. Calcified and tortuous aorta. No pleural effusions. No pneumothorax. Surgical clips in the right axilla. IMPRESSION: Infiltration suggested behind the heart in the left lung base, possibly pneumonia. Electronically Signed   By: Lucienne Capers M.D.   On: 04/15/2017  04:48    ROS: not able to be obtained due to AMS  Blood pressure (!) 175/135, pulse 79, temperature 98.5 F (36.9 C), temperature source Oral, resp. rate 18, height 5' (1.524 m), weight 55.9 kg (123 lb 3.8 oz), SpO2 97 %. Physical Exam  GEN: ill-appearing, lying in bed, husband at bedside HEENT PERRL, MMM NECK + 3 cm JVD PULM normal WOB, some inspiratory rhonchi bilaterally CV RRR no m/r/g ABD soft, mildly distended, ? R sided stool burden EXT no LE edema NEURO awake, able to answer questions intermittently.  No seizure activity observed  Assessment/Plan  1.  Acute on chronic hyponatremia:  Urine studies pending; it appears that pt has chronic hyponatremia and in the setting of her metastatic breast cancer I presume her chronic hyponatremia is SIADH.  She is also on HCTZ which can be contributing, and her pneumonia likely exacerbated the problem too.  She requires 3% NaCl which she is getting.  She was getting 75 mL/ hr--> has been turned down to the prescribed rate of 50 mL/ hr.  Recommendations as follows: - agree with 3%NaCl - q 2 serum sodiums - would not correct beyond 111 in  the first 24 hours (aka correcting the acute portion of her hyponatremia) - once Na is back at 111, would aim to correct no more then 6 mEq per 24 hours - will place on 1L fluid restriction - she will probably require Lasix to maintain serum sodium during this hospitalization but would avoid this and tolvaptan right now since she's on 3% NaCl.  2.  Metastatic breast cancer:  On Ibrance, followed by oncology  3.  Pneumonia: on broad spectrum antibiotics, per primary   Madelon Lips, MD [facility] 04/16/2017, 11:42 AM

## 2017-04-16 NOTE — Significant Event (Signed)
Rapid Response Event Note  Overview: Time Called: 0110 Arrival Time: 0740 Event Type: Neurologic  Initial Focused Assessment: patient was alert and oriented at 0400 when she they took vitals and took her to bathroom. When nurse arrived in the room for morning rounds she was disoriented and lethargic but was able to follow commands. Her sodium was 103 and MD wanted to get her to ICU/SD as soon as possible to start 3% NS.  Interventions: see flowsheets for vital signs  Plan of Care (if not transferred): transferred to ICU/SD  Event Summary: Name of Physician Notified: Dr. Verlon Au at [place]    at    Outcome: Transferred (Comment) (transferred to ICU/SD)  Event End Time: Jessica Zavala, Jessica Zavala

## 2017-04-16 NOTE — Progress Notes (Signed)
HEMATOLOGY-ONCOLOGY PROGRESS NOTE  SUBJECTIVE:71 year old Jessica Zavala with metastatic breast cancer who is currently on Palbociclib and letrozole. She takes this unit 3 weeks on 2 weeks off schedule. She is currently is doing off week. She presented to the hospital with the cough and was diagnosed with pneumonia. She went to [place] for a wedding and when she came back she was having these respiratory symptoms. She is currently on IV antibiotics for the pneumonia. Since yesterday evening she developed mild confusion and apparently confused speech. This morning her symptoms are slightly better. She does sound more normal. However she still feels very weak. She has not had any fevers since yesterday.    Breast cancer of lower-outer quadrant of right female breast ([place])   01/04/1995 Surgery    Right breast cancer status post lumpectomy followed by chemotherapy followed by radiation and tamoxifen 5 years (details are not available)      12/30/2015 Relapse/Recurrence    Chest wall/ soft tissue masses near the right side of the sternum, superior nodule measuring 14.5 mm, inferior nodule measuring 10.5 mm, 26 mm anterior mediastinal soft tissue mass, T9 bone metastases      12/30/2015 Procedure    Thoracentesis: 400 mL removed; showed reactive mesothelial cells, no cancer cells      12/30/2015 Imaging    CT chest: 26 x 17 mm anterior mediastinal soft tissue mass, mixed lytic and sclerotic bone mets mainly involving sternum and T9 vertebral body, scattered vertebral body involvement and rib involvement; chest wall soft tissue masses 14.5 mm, 10.5 mm      01/13/2016 Initial Biopsy    Right chest wall mass biopsy 2:30 position: Invasive ductal carcinoma focally involves skeletal muscle, grade 2, HER-2 negative ratio 1.08, ER/PR 95% positive      01/14/2016 PET scan    Widespread metastatic disease right breast, right chest wall, mediastinum, right internal mammary lymph nodes, right pleural  space, malignant pleural effusion, right common iliac lymph node, bone metastases in the thorax      01/21/2016 -  Anti-estrogen oral therapy    Ibrance with letrozole       OBJECTIVE: REVIEW OF SYSTEMS:   Difficult to get because of mild confusion. But she denies any pain. Denies any fevers or chills Mild cough.  All other systems were reviewed with the Zavala and are negative.  PHYSICAL EXAMINATION: ECOG PERFORMANCE STATUS: 3 - Symptomatic, >50% confined to bed  Vitals:   04/16/17 0900 04/16/17 0902  BP: (!) 195/123 (!) 149/121  Pulse: 75 75  Resp: 20 16  Temp:     Filed Weights   04/15/17 0408 04/15/17 1036  Weight: 120 lb (54.4 kg) 123 lb 3.8 oz (55.9 kg)    GENERAL:alert, no distress and comfortable SKIN: skin color, texture, turgor are normal, no rashes or significant lesions EYES: normal, Conjunctiva are pink and non-injected, sclera clear OROPHARYNX:no exudate, no erythema and lips, buccal mucosa, and tongue normal  NECK: supple, thyroid normal size, non-tender, without nodularity LYMPH:  no palpable lymphadenopathy in the cervical, axillary or inguinal LUNGS:Mild cough, diminished breath sounds at the bases HEART: regular rate & rhythm and no murmurs and no lower extremity edema ABDOMEN:abdomen soft, non-tender and normal bowel sounds Musculoskeletal:no cyanosis of digits and no clubbing  NEURO:mild confusion according the Zavala's family but she is able to move her extremities fairly well. She was able to recognize me.  LABORATORY DATA:  I have reviewed the data as listed CMP Latest Ref Rng & Units 04/16/2017  04/15/2017 03/31/2017  Glucose 65 - 99 mg/dL 143(H) 125(H) 99  BUN 6 - 20 mg/dL 11 14 15.9  Creatinine 0.44 - 1.00 mg/dL 0.56 0.65 0.8  Sodium 135 - 145 mmol/L 103(LL) 111(LL) 125(L)  Potassium 3.5 - 5.1 mmol/L 3.6 3.4(L) 5.2(H)  Chloride 101 - 111 mmol/L 68(L) 74(L) -  CO2 22 - 32 mmol/L 25 27 30(H)  Calcium 8.9 - 10.3 mg/dL 7.8(L) 10.0 9.7  Total  Protein 6.4 - 8.3 g/dL - - 7.6  Total Bilirubin 0.20 - 1.20 mg/dL - - 0.52  Alkaline Phos 40 - 150 U/L - - 45  AST 5 - 34 U/L - - 26  ALT 0 - 55 U/L - - 15    Lab Results  Component Value Date   WBC 3.2 (L) 04/16/2017   HGB 10.6 (L) 04/16/2017   HCT 27.0 (L) 04/16/2017   MCV 88.8 04/16/2017   PLT 240 04/16/2017   NEUTROABS 1.6 (L) 04/15/2017    ASSESSMENT AND PLAN: 1. Pneumonia: Currently on IV antibiotics. Zavala is not neutropenic. 2. Metastatic breast cancer: On Palbociclib and letrozole. With Palbociclib patients are prone to have neutropenia and there is always a risk of infection. However she is not currently neutropenic. She had been responding very well to this treatment. 3. Anemia: Related to current breast cancer therapy. Thank you very much for taking care of her. I will follow her intermittently.

## 2017-04-16 NOTE — Progress Notes (Signed)
Patient more alert this evening, not as restless but still confused.  Na 107, 3% NS continues at 50 cc hr. Pt remains on fluid restriction she and husband need frequent reminders of the restriction.

## 2017-04-16 NOTE — Progress Notes (Signed)
Jessica Zavala ZOX:[id number] DOB: [birth year] DOA: 04/15/2017 PCP: [name], MD  Brief narrative: 82  htn on thiazide  metastatic widespread R breast cancer with metastasis  Diagnosed 1996--recurrence 12/2015  on imbrance and letrozole foll Dr. Lindi Adie Lipidemia Admitted 04/16/2017 one-week history of productive cough with sputum and exertional dyspnea and chest discomfort Initially sodium 111 WBC 2.6 and chest x-ray concerning for pneumonia  Sodium dropped over the course 5/12-5/13- to 103, patient was symptomatic with increasing lethargy and confusion and was transferred to stepdown  Past medical history-As per Problem list Chart reviewed as below- Reviewed  Consultants:  Nephrology  Oncology  Procedures:  None  Antibiotics:  Cefepime  Vancomycin   Subjective   Patient very sleepy on initial evaluation this morning at 7 AM not oriented On revisit in reevaluation around 11 AM patient more arousable after transfer to stepdown and initiation of hypertonic saline She is complaining of heartburn currently   Objective    Interim History:   Telemetry: Sinus rhythm   Objective: Vitals:   04/15/17 1036 04/15/17 1500 04/15/17 2150 04/16/17 0540  BP: 139/70 140/75 (!) 175/76 (!) 156/64  Pulse: 79 78 67 75  Resp: 17 18 18 18   Temp: 98.3 F (36.8 C) 97.9 F (36.6 C) 97.5 F (36.4 C) 98.3 F (36.8 C)  TempSrc: Oral Oral Oral Oral  SpO2: 97% 97% 97% 98%  Weight: 55.9 kg (123 lb 3.8 oz)     Height: 5' (1.524 m)       Intake/Output Summary (Last 24 hours) at 04/16/17 0736 Last data filed at 04/16/17 0700  Gross per 24 hour  Intake           3057.5 ml  Output              202 ml  Net           2855.5 ml    Exam:  General: Arousable but still drowsy appearing Asian lady  Cardiovascular: S1-S2 no murmur rub or gallop Respiratory: Clinically clear no added sound Abdomen: Soft nontender, no rebound  Skin intact no lower extremity edema Neuro sleepy but  arousable  Data Reviewed: Basic Metabolic Panel:  Recent Labs Lab 04/15/17 0438 04/16/17 0557 04/16/17 1022  NA 111* 103* 103*  K 3.4* 3.6  --   CL 74* 68*  --   CO2 27 25  --   GLUCOSE 125* 143*  --   BUN 14 11  --   CREATININE 0.65 0.56  --   CALCIUM 10.0 7.8*  --    Liver Function Tests: No results for input(s): AST, ALT, ALKPHOS, BILITOT, PROT, ALBUMIN in the last 168 hours. No results for input(s): LIPASE, AMYLASE in the last 168 hours. No results for input(s): AMMONIA in the last 168 hours. CBC:  Recent Labs Lab 04/15/17 0438 04/16/17 0557  WBC 2.6* 3.2*  NEUTROABS 1.6*  --   HGB 11.5* 10.6*  HCT 30.3* 27.0*  MCV 92.1 88.8  PLT 267 240   Cardiac Enzymes:  Recent Labs Lab 04/15/17 1110 04/15/17 1651 04/15/17 2232  TROPONINI <0.03 <0.03 <0.03   BNP: Invalid input(s): POCBNP CBG: No results for input(s): GLUCAP in the last 168 hours.  No results found for this or any previous visit (from the past 240 hour(s)).   Studies:              All Imaging reviewed and is as per above notation   Scheduled Meds: . clobetasol cream  1 application  Topical BID  . dextromethorphan-guaiFENesin  2 tablet Oral Q12H  . enoxaparin (LOVENOX) injection  40 mg Subcutaneous Q24H  . losartan  100 mg Oral Daily   And  . hydrochlorothiazide  25 mg Oral Daily  . labetalol  200 mg Oral BID  . letrozole  2.5 mg Oral Daily  . simvastatin  40 mg Oral q1800  . triamcinolone  1 application Mouth/Throat BID  . triamcinolone cream   Topical BID   Continuous Infusions: . ceFEPime (MAXIPIME) IV Stopped (04/16/17 4287)  . dextrose 75 mL/hr at 04/15/17 2300  . vancomycin Stopped (04/15/17 2359)     Assessment/Plan:  1. Symptomatic hyponatremia-possible combination of excessive free water given received D5 initially as well as natriuresis from thiazide diuretic-on hypertonic saline protocol requiring frequent sodium checks every 2 over the next 24-48 hours. Patient to have 3%  saline with frequent lab draws as per nephrology guidance and would not overcorrect >10 mEq/above 113 of Sodium in the next 24 hours. Greatly appreciate nephrology guidance 2. Toxic metabolic encephalopathy secondary to hyponatremia-will resolve slowly 3. Healthcare associated pneumonia-continue cefepime, continue vancomycin. Narrow antibiotics when patient reliably able to take by mouth 4. Hypertension-continue labetalol 200 twice a day, Cozaar 100 daily 5. Reflux-give Protonix for GERD-converted to by mouth in a.m. 6. Hyperlipidemia discontinue statin for now-resume on discharge 7. Metastatic rest cancer on oral therapy-continue letrozole 2.5 daily   Long discussion with family and second discussion with patient's son as well >35 minutes critical care time in person and then another 30 minutes care coordination Inpatient, stepdown Expect we'll be able to transition and transferred to regular floor in the next 24-48 hours  Verneita Griffes, MD  [facility] Pager [phone] 04/16/2017, 7:36 AM    LOS: 1 day

## 2017-04-16 NOTE — Progress Notes (Signed)
Critical Na level of 111 called.  Dr. Hal Hope notified at bedside.  New orders obtained.  Will continue to monitor.

## 2017-04-16 NOTE — Progress Notes (Signed)
Upon doing rounds with off going nurse, patient was found to be increasingly confused and not following commands , VS obtained and BP elevated, Rapid response Nurse contacted and  Notified of critical lab report ,Na 103, while at bedside, MD in to see patient and orders to transfer to step down ICU ordered. Report given at bedside to [facility], [place]

## 2017-04-17 LAB — LEGIONELLA PNEUMOPHILA SEROGP 1 UR AG: L. PNEUMOPHILA SEROGP 1 UR AG: NEGATIVE

## 2017-04-17 LAB — BASIC METABOLIC PANEL
ANION GAP: 6 (ref 5–15)
BUN: 8 mg/dL (ref 6–20)
CALCIUM: 6.8 mg/dL — AB (ref 8.9–10.3)
CHLORIDE: 77 mmol/L — AB (ref 101–111)
CO2: 25 mmol/L (ref 22–32)
Creatinine, Ser: 0.36 mg/dL — ABNORMAL LOW (ref 0.44–1.00)
GFR calc non Af Amer: >60 mL/min (ref >60)
Glucose, Bld: 110 mg/dL — ABNORMAL HIGH (ref 65–99)
Potassium: 3.1 mmol/L — ABNORMAL LOW (ref 3.5–5.1)
SODIUM: 108 mmol/L — AB (ref 135–145)

## 2017-04-17 LAB — SODIUM
Sodium: 111 mmol/L — CL (ref 135–145)
Sodium: 111 mmol/L — CL (ref 135–145)
Sodium: 110 mmol/L — CL (ref 135–145)
Sodium: 108 mmol/L — CL (ref 135–145)
Sodium: 111 mmol/L — CL (ref 135–145)

## 2017-04-17 LAB — SODIUM, URINE, RANDOM
SODIUM UR: 122 mmol/L
Sodium, Ur: 122 mmol/L

## 2017-04-17 LAB — MRSA PCR SCREENING: MRSA BY PCR: NEGATIVE

## 2017-04-17 LAB — OSMOLALITY, URINE: Osmolality, Ur: 399 mOsm/kg (ref 300–900)

## 2017-04-17 LAB — MAGNESIUM: MAGNESIUM: 1.3 mg/dL — AB (ref 1.7–2.4)

## 2017-04-17 LAB — OSMOLALITY: Osmolality: 230 mOsm/kg — CL (ref 275–295)

## 2017-04-17 MED ORDER — FUROSEMIDE 10 MG/ML IJ SOLN
40.0000 mg | Freq: Once | INTRAMUSCULAR | Status: DC
Start: 2017-04-17 — End: 2017-04-17

## 2017-04-17 MED ORDER — SODIUM CHLORIDE 3 % IV SOLN
INTRAVENOUS | Status: DC
  Administered 2017-04-17: 65 mL/h via INTRAVENOUS
  Filled 2017-04-17 (×2): qty 500

## 2017-04-17 MED ORDER — POTASSIUM CHLORIDE CRYS ER 20 MEQ PO TBCR
40.0000 meq | EXTENDED_RELEASE_TABLET | Freq: Every day | ORAL | Status: DC
  Administered 2017-04-17 – 2017-04-21 (×5): 40 meq via ORAL
  Filled 2017-04-17 (×5): qty 2

## 2017-04-17 MED ORDER — SODIUM CHLORIDE 3 % IV SOLN
INTRAVENOUS | Status: DC
  Administered 2017-04-17: 65 mL/h via INTRAVENOUS
  Filled 2017-04-17 (×4): qty 500

## 2017-04-17 MED ORDER — POTASSIUM CHLORIDE 20 MEQ PO PACK: 40.0000 meq | PACK | Freq: Every day | ORAL | Status: DC

## 2017-04-17 NOTE — Progress Notes (Signed)
PT Cancellation Note  Patient Details Name: Jessica Zavala MRN: [id number] DOB: [birth year]   Cancelled Treatment:    Reason Eval/Treat Not Completed: Medical issues which prohibited therapy (symptomatic hyponatremia)   [name],[name] 04/17/2017, 1:41 PM Carmelia Bake, PT, DPT 04/17/2017 Pager: [phone]

## 2017-04-17 NOTE — Progress Notes (Signed)
  [facility] Progress Note   Subjective: Na improved on 3% saline up to 111, then dropped to 108 this am.  3% turned off around 6 pm last night. Has been getting NS at 75/hr as well.  Pt remains lethargic  Vitals:   04/17/17 0900 04/17/17 1000 04/17/17 1100 04/17/17 1200  BP:    (!) 118/94  Pulse: 80 79 64 69  Resp: 18 15 12 17   Temp:    97.8 F (36.6 C)  TempSrc:    Oral  SpO2: 99% 98% 99% 99%  Weight:      Height:        Inpatient medications: . clobetasol cream  1 application Topical BID  . dextromethorphan-guaiFENesin  2 tablet Oral Q12H  . labetalol  200 mg Oral BID  . letrozole  2.5 mg Oral Daily  . pantoprazole (PROTONIX) IV  40 mg Intravenous Q12H  . potassium chloride  40 mEq Oral Daily  . triamcinolone  1 application Mouth/Throat BID  . triamcinolone cream   Topical BID   . ceFEPime (MAXIPIME) IV Stopped (04/17/17 1610)  . sodium chloride (hypertonic)    . vancomycin Stopped (04/17/17 9604)   acetaminophen, benzonatate, ondansetron (ZOFRAN) IV  Exam: Opens eyes, doesn't follow many commands, falls right back asleep No jvd Chest CTAB RRR  ABD soft ntnd Ext no edema  Urine Na 86  Uosm 568     Assessment: 1   Acute on chronic hyponatremia: symptomatic, remains confused and somnolent.  Will resume 3% saline 65 cc/hr, dc the normal saline at 75 / hr, and dc ARB (may inhibit recovery from hypoNa+). Continue Na checks every four hrs for now.  Keep in ICU.   2  Metastatic breast Ca  Plan - as above   Kelly Splinter MD [place] pager [phone]   04/17/2017, 1:25 PM    Recent Labs Lab 04/15/17 0438 04/16/17 0557  04/17/17 0307 04/17/17 0455 04/17/17 1201  NA 111* 103*  < > 111* 110* 108*  K 3.4* 3.6  --   --   --  3.1*  CL 74* 68*  --   --   --  77*  CO2 27 25  --   --   --  25  GLUCOSE 125* 143*  --   --   --  110*  BUN 14 11  --   --   --  8  CREATININE 0.65 0.56  --   --   --  0.36*  CALCIUM 10.0 7.8*  --    --   --  6.8*  < > = values in this interval not displayed. No results for input(s): AST, ALT, ALKPHOS, BILITOT, PROT, ALBUMIN in the last 168 hours.  Recent Labs Lab 04/15/17 0438 04/16/17 0557  WBC 2.6* 3.2*  NEUTROABS 1.6*  --   HGB 11.5* 10.6*  HCT 30.3* 27.0*  MCV 92.1 88.8  PLT 267 240   Iron/TIBC/Ferritin/ %Sat No results found for: IRON, TIBC, FERRITIN, IRONPCTSAT

## 2017-04-17 NOTE — Progress Notes (Signed)
CRITICAL VALUE ALERT  Critical value received:  Na 108  Date of notification:  04/17/17  Time of notification:  13:04  Critical value read back:Yes.    Nurse who received alert:  Juel Burrow, RN  MD notified (1st page):  Dr. Verlon Au  Time of first page:  13:05  MD notified (2nd page):  Time of second page:  Responding MD:  Dr. Verlon Au  Time MD responded:  13:06

## 2017-04-17 NOTE — Progress Notes (Signed)
D/w Dr. Arie Sabina is to STOP hypertonic saline and put on no IVF once patient serum sodium reaches 116 in an effort not to overcorrect We will reevaluate with further labs in the a.m. regarding next plans for hypernatremia and appreciate input from nephrology

## 2017-04-17 NOTE — Progress Notes (Signed)
Jessica Zavala IZT:[id number] DOB: Feb 23, 1946 DOA: 04/15/2017 PCP: [name], MD  Brief narrative:  38  htn on thiazide  metastatic widespread R breast cancer with metastasis  Diagnosed 1996--recurrence 12/2015  on imbrance and letrozole foll Dr. Lindi Adie Lipidemia Admitted 04/16/2017 one-week history of productive cough with sputum and exertional dyspnea and chest discomfort Initially sodium 111 WBC 2.6 and chest x-ray concerning for pneumonia  Sodium dropped over the course 5/12-5/13- to 103, patient was symptomatic with increasing lethargy and confusion and was transferred to stepdown  Past medical history-As per Problem list Chart reviewed as below- Reviewed  Consultants:  Nephrology  Oncology  Procedures:  None  Antibiotics:  Cefepime  Vancomycin   Subjective   Awake alert in nad Much more oriented Very weak which is main concern No cp Coughing some   Objective    Interim History:   Telemetry: Sinus rhythm   Objective: Vitals:   04/17/17 0800 04/17/17 0855 04/17/17 0900 04/17/17 1000  BP:  (!) 151/69    Pulse: 68 71 80 79  Resp: 17 (!) 21 18 15   Temp: 98 F (36.7 C)     TempSrc: Oral     SpO2: 99% 100% 99% 98%  Weight:      Height:        Intake/Output Summary (Last 24 hours) at 04/17/17 1107 Last data filed at 04/17/17 1005  Gross per 24 hour  Intake          2508.33 ml  Output             1650 ml  Net           858.33 ml    Exam:  General: Arousable but still drowsy appearing Asian lady  Cardiovascular: S1-S2 no murmur rub or gallop Respiratory: some slight rales bilat no tvr or tvf Abdomen: Soft nontender, no rebound  Skin intact no lower extremity edema Neuro sleepy but arousable  Data Reviewed: Basic Metabolic Panel:  Recent Labs Lab 04/15/17 0438 04/16/17 0557  04/16/17 1944 04/16/17 2119 04/16/17 2318 04/17/17 0307 04/17/17 0455  NA 111* 103*  < > 110* 111* 111* 111* 110*  K 3.4* 3.6  --   --   --   --   --    --   CL 74* 68*  --   --   --   --   --   --   CO2 27 25  --   --   --   --   --   --   GLUCOSE 125* 143*  --   --   --   --   --   --   BUN 14 11  --   --   --   --   --   --   CREATININE 0.65 0.56  --   --   --   --   --   --   CALCIUM 10.0 7.8*  --   --   --   --   --   --   < > = values in this interval not displayed. Liver Function Tests: No results for input(s): AST, ALT, ALKPHOS, BILITOT, PROT, ALBUMIN in the last 168 hours. No results for input(s): LIPASE, AMYLASE in the last 168 hours. No results for input(s): AMMONIA in the last 168 hours. CBC:  Recent Labs Lab 04/15/17 0438 04/16/17 0557  WBC 2.6* 3.2*  NEUTROABS 1.6*  --   HGB 11.5* 10.6*  HCT 30.3* 27.0*  MCV 92.1 88.8  PLT 267 240   Cardiac Enzymes:  Recent Labs Lab 04/15/17 1110 04/15/17 1651 04/15/17 2232  TROPONINI <0.03 <0.03 <0.03   BNP: Invalid input(s): POCBNP CBG: No results for input(s): GLUCAP in the last 168 hours.  Recent Results (from the past 240 hour(s))  Blood culture (routine x 2)     Status: None (Preliminary result)   Collection Time: 04/15/17  8:13 AM  Result Value Ref Range Status   Specimen Description BLOOD LEFT ANTECUBITAL  Final   Special Requests   Final    BOTTLES DRAWN AEROBIC AND ANAEROBIC Blood Culture adequate volume   Culture   Final    NO GROWTH 1 DAY Performed at [facility], [address] [place], [place] [address]    Report Status PENDING  Incomplete  Blood culture (routine x 2)     Status: None (Preliminary result)   Collection Time: 04/15/17  8:14 AM  Result Value Ref Range Status   Specimen Description BLOOD LEFT HAND  Final   Special Requests   Final    BOTTLES DRAWN AEROBIC AND ANAEROBIC Blood Culture adequate volume   Culture   Final    NO GROWTH 1 DAY Performed at [facility], [place] [address], [place], [place] [address]    Report Status PENDING  Incomplete     Studies:              All Imaging reviewed and is as per above notation     Scheduled Meds: . clobetasol cream  1 application Topical BID  . dextromethorphan-guaiFENesin  2 tablet Oral Q12H  . enoxaparin (LOVENOX) injection  40 mg Subcutaneous Q24H  . labetalol  200 mg Oral BID  . letrozole  2.5 mg Oral Daily  . losartan  100 mg Oral Daily  . pantoprazole (PROTONIX) IV  40 mg Intravenous Q12H  . triamcinolone  1 application Mouth/Throat BID  . triamcinolone cream   Topical BID   Continuous Infusions: . sodium chloride 75 mL/hr at 04/17/17 0852  . ceFEPime (MAXIPIME) IV Stopped (04/17/17 5035)  . vancomycin Stopped (04/17/17 4656)     Assessment/Plan:  1. Symptomatic hyponatremia-possible combination of excessive free water given received D5 initially as well as natriuresis from thiazide diuretic-stopped hypertonic saline 5/14 given rise in sodium to 111. would not overcorrect >10 mEq-now using fluid restriction and Iv saline 75 cc/h. Will be checking q8 labs from 12 pm today.  Greatly appreciate nephrology guidance 2. Toxic metabolic encephalopathy secondary to hyponatremia-resovling 3. Healthcare associated pneumonia-continue cefepime, continue vancomycin. Narrow antibiotics in 24 hours to probable levaquin 4. Hypertension-continue labetalol 200 twice a day, Cozaar 100 daily 5. Reflux-give Protonix for GERD-converted to by mouth in a.m. 6. Hyperlipidemia discontinue statin for now-resume on discharge 7. Metastatic rest cancer on oral therapy-continue letrozole 2.5 daily   Long discussion with family  25 min Keep on SDU pending resolution of labs Expect we'll be able to transition and transferred to regular floor in the next 24-48 hours  Verneita Griffes, MD  [facility] Pager [phone] 04/17/2017, 11:07 AM    LOS: 2 days

## 2017-04-17 NOTE — Progress Notes (Signed)
CRITICAL VALUE ALERT  Critical value received:  Serum osmolality 230  Date of notification:  04/17/17  Time of notification:  19:25  Critical value read back:Yes.    Nurse who received alert:  Juel Burrow, RN  MD notified (1st page):  Baltazar Najjar, NP  Time of first page:  19:27  MD notified (2nd page):  Time of second page:  Responding MD:    Time MD responded:

## 2017-04-17 NOTE — Care Management Note (Signed)
Case Management Note  Patient Details  Name: Jessica Zavala MRN: [id number] Date of Birth: 15-Sep-1946  Subjective/Objective:     71 y.o. female with known breast cancer on oral tx, presented to [facility] with main concern of almost now one week in duration progressively worsening productive cough of yellow sputum that is associated with exertional dyspnea and chest discomfort that is only present with coughing spells. Pt reports subjective fevers as well, nausea and poor oral intake. She was seen in urgent care few days prior to this admission and was given mucinex, sent home but she continued to get worse. No specific abd or urinary concerns.               Action/Plan: home with spouse Date:  Apr 17, 2017  Chart reviewed for concurrent status and case management needs.  Will continue to follow patient progress.  Discharge Planning: following for needs  Expected discharge date: [id number]  Velva Harman, BSN, [place], [place]   Expected Discharge Date:  04/16/17               Expected Discharge Plan:     In-House Referral:     Discharge planning Services     Post Acute Care Choice:    Choice offered to:     DME Arranged:    DME Agency:     HH Arranged:    HH Agency:     Status of Service:     If discussed at [employer] of Stay Meetings, dates discussed:    Additional Comments:  Leeroy Cha, RN 04/17/2017, 9:22 AM

## 2017-04-18 LAB — BASIC METABOLIC PANEL
ANION GAP: 5 (ref 5–15)
BUN: 6 mg/dL (ref 6–20)
CO2: 24 mmol/L (ref 22–32)
CREATININE: 0.34 mg/dL — AB (ref 0.44–1.00)
Calcium: 6.7 mg/dL — ABNORMAL LOW (ref 8.9–10.3)
Chloride: 87 mmol/L — ABNORMAL LOW (ref 101–111)
GLUCOSE: 108 mg/dL — AB (ref 65–99)
POTASSIUM: 3.4 mmol/L — AB (ref 3.5–5.1)
Sodium: 116 mmol/L — CL (ref 135–145)

## 2017-04-18 LAB — SODIUM
SODIUM: 115 mmol/L — AB (ref 135–145)
SODIUM: 119 mmol/L — AB (ref 135–145)
SODIUM: 119 mmol/L — AB (ref 135–145)
Sodium: 118 mmol/L — CL (ref 135–145)

## 2017-04-18 MED ORDER — HYDRALAZINE HCL 20 MG/ML IJ SOLN
5.0000 mg | INTRAMUSCULAR | Status: DC | PRN
  Administered 2017-04-18 – 2017-04-20 (×4): 5 mg via INTRAVENOUS
  Filled 2017-04-18 (×4): qty 1

## 2017-04-18 MED ORDER — SODIUM CHLORIDE 3 % IV SOLN
INTRAVENOUS | Status: DC
  Administered 2017-04-18: 50 mL/h via INTRAVENOUS
  Administered 2017-04-18 – 2017-04-19 (×2): 65 mL/h via INTRAVENOUS
  Filled 2017-04-18 (×5): qty 500

## 2017-04-18 MED ORDER — LEVOFLOXACIN 500 MG PO TABS
500.0000 mg | ORAL_TABLET | Freq: Every evening | ORAL | Status: AC
  Administered 2017-04-18 – 2017-04-22 (×5): 500 mg via ORAL
  Filled 2017-04-18 (×5): qty 1

## 2017-04-18 NOTE — Progress Notes (Signed)
CRITICAL VALUE ALERT  Critical value received:  Na 116  Date of notification:  04/18/17  Time of notification:  5681  Critical value read back:yes  Nurse who received alert:  P. Olevia Bowens RN  MD notified (1st page):  Dr. Jonnie Finner  Time of first page:  [phone]

## 2017-04-18 NOTE — Progress Notes (Signed)
CRITICAL VALUE ALERT  Critical value received: Na+ 115  Date of notification: 04/18/17  Time of notification: 0115  Critical value read back:Yes.    Nurse who received alert:  Darrin Nipper, RN  MD notified (1st page): Lorrene Reid

## 2017-04-18 NOTE — Care Management Note (Signed)
Case Management Note  Patient Details  Name: Jessica Zavala MRN: [id number] Date of Birth: 1945/12/22  Subjective/Objective:    hyponatremia                Action/Plan: Date:  Apr 18, 2017  Chart reviewed for concurrent status and case management needs.  Will continue to follow patient progress.  Discharge Planning: following for needs  Expected discharge date: [id number]  Velva Harman, BSN, [place], [place]   Expected Discharge Date:  04/16/17               Expected Discharge Plan:  Home/Self Care  In-House Referral:     Discharge planning Services  CM Consult  Post Acute Care Choice:    Choice offered to:     DME Arranged:    DME Agency:     HH Arranged:    HH Agency:     Status of Service:  Completed, signed off  If discussed at [employer] of Stay Meetings, dates discussed:    Additional Comments:  Leeroy Cha, RN 04/18/2017, 9:42 AM

## 2017-04-18 NOTE — Evaluation (Signed)
Physical Therapy Evaluation Patient Details Name: TIFFINE HENIGAN MRN: [id number] DOB: Jul 01, 1946 Today's Date: 04/18/2017   History of Present Illness   Mackensey T Schrom is a 71 y.o. female with known breast cancer on oral tx, presented to [facility] 04/15/17 with one week in duration progressively worsening productive cough , LLLpna, hyponatremia.  Clinical Impression  The patient  Was able to ambulate in the room with mod assist and RW, gait is unsteady. The patient  Complained of dizziness after stopping in BR. RN in to assist back to bed.Patient's BP post activity 106/37 from 119/42. Pt admitted with above diagnosis. Pt currently with functional limitations due to the deficits listed below (see PT Problem List).  Pt will benefit from skilled PT to increase their independence and safety with mobility to allow discharge to the venue listed below.       Follow Up Recommendations  TBD- should porgress to supervision  When labs normalize.    Equipment Recommendations    TBD   Recommendations for Other Services   OT    Precautions / Restrictions Precautions Precautions: Fall Precaution Comments: monitor BP, check sodium results for the AM, has been critical. Dr. Verlon Au cleared PT eval today.      Mobility  Bed Mobility Overal bed mobility: Needs Assistance Bed Mobility: Supine to Sit;Sit to Supine     Supine to sit: Min assist Sit to supine: Min assist   General bed mobility comments: cues for technique.  Transfers Overall transfer level: Needs assistance Equipment used: Rolling walker (2 wheeled) Transfers: Sit to/from Stand Sit to Stand: Mod assist;+2 safety/equipment         General transfer comment: patient is wobbly, swaying  upon standing, steady assist   for balance.  Ambulation/Gait Ambulation/Gait assistance: Mod assist Ambulation Distance (Feet): 75 Feet Assistive device: Rolling walker (2 wheeled) Gait Pattern/deviations: Step-to pattern;Step-through pattern;Staggering  right;Staggering left;Drifts right/left;Decreased stride length Gait velocity: decr   General Gait Details: patient's gait is unsteady, sways and staggers with RW. no balance loss to point of fall, just unsteady"drunken-like ' gait.  Assistance required at times to maneuvre Rw around  objects and turning. did ambulate in room with tight turns.. the patient complaied of dizziness when in bathroom. assisted back to bed with RN assisting.. BP 106/37. HR 68, O2 100% ra  Stairs            Wheelchair Mobility    Modified Rankin (Stroke Patients Only)       Balance Overall balance assessment: Needs assistance Sitting-balance support: Feet supported;Bilateral upper extremity supported Sitting balance-Leahy Scale: Fair     Standing balance support: During functional activity;Bilateral upper extremity supported Standing balance-Leahy Scale: Poor                               Pertinent Vitals/Pain Pain Assessment: No/denies pain    Home Living Family/patient expects to be discharged to:: Private residence Living Arrangements: Spouse/significant other Available Help at Discharge: Family Type of Home: House Home Access: Level entry     Home Layout: One level Home Equipment: None      Prior Function Level of Independence: Independent               Hand Dominance        Extremity/Trunk Assessment   Upper Extremity Assessment Upper Extremity Assessment: Generalized weakness    Lower Extremity Assessment Lower Extremity Assessment: Generalized weakness    Cervical / Trunk  Assessment Cervical / Trunk Assessment: Normal  Communication   Communication: Prefers language other than English (spouse present, understands english, interpreted.)  Cognition Arousal/Alertness: Lethargic Behavior During Therapy: Flat affect Overall Cognitive Status: Difficult to assess  Was able to follow directions from spouse. Low energy                                  General Comments: did not ask orientation q's, patient foloowed commands and gestures, let PT know that she was dizzy.      General Comments      Exercises     Assessment/Plan    PT Assessment Patient needs continued PT services  PT Problem List Decreased strength;Decreased activity tolerance;Decreased balance;Decreased mobility;Decreased knowledge of precautions;Decreased safety awareness;Decreased knowledge of use of DME;Cardiopulmonary status limiting activity       PT Treatment Interventions DME instruction;Gait training;Functional mobility training;Therapeutic activities;Therapeutic exercise    PT Goals (Current goals can be found in the Care Plan section)  Acute Rehab PT Goals Patient Stated Goal: per spouse, to return home PT Goal Formulation: With patient/family Time For Goal Achievement: 05/02/17 Potential to Achieve Goals: Good    Frequency Min 3X/week   Barriers to discharge        Co-evaluation               AM-PAC PT "6 Clicks" Daily Activity  Outcome Measure Difficulty turning over in bed (including adjusting bedclothes, sheets and blankets)?: A Little Difficulty moving from lying on back to sitting on the side of the bed? : A Little Difficulty sitting down on and standing up from a chair with arms (e.g., wheelchair, bedside commode, etc,.)?: A Little Help needed moving to and from a bed to chair (including a wheelchair)?: A Little Help needed walking in hospital room?: A Lot Help needed climbing 3-5 steps with a railing? : A Lot 6 Click Score: 16    End of Session Equipment Utilized During Treatment: Gait belt Activity Tolerance: Patient tolerated treatment well Patient left: in bed;with call bell/phone within reach;with bed alarm set;with family/visitor present Nurse Communication: Mobility status PT Visit Diagnosis: Unsteadiness on feet (R26.81)    Time: 7371-0626 PT Time Calculation (min) (ACUTE ONLY): 24 min   Charges:   PT  Evaluation $PT Eval Low Complexity: 1 Procedure PT Treatments $Gait Training: 8-22 mins   PT G CodesTresa Endo PT [phone]   Claretha Cooper 04/18/2017, 5:03 PM

## 2017-04-18 NOTE — Progress Notes (Signed)
Jessica Zavala WLN:[id number] DOB: 1946-11-24 DOA: 04/15/2017 PCP: [name], MD  Brief narrative:  28  htn on thiazide  metastatic widespread R breast cancer with metastasis  Diagnosed 1996--recurrence 12/2015  on imbrance and letrozole foll Dr. Lindi Adie Lipidemia Admitted 04/16/2017 one-week history of productive cough with sputum and exertional dyspnea and chest discomfort Initially sodium 111 WBC 2.6 and chest x-ray concerning for pneumonia  Sodium dropped over the course 5/12-5/13- to 103, patient was symptomatic with increasing lethargy and confusion and was transferred to stepdown  Past medical history-As per Problem list Chart reviewed as below- Reviewed  Consultants:  Nephrology  Oncology  Procedures:  None  Antibiotics:  Cefepime  Vancomycin   Subjective   Pleasant  much more alert no issue currently other thasn overall weakness  And cough Oriented x 3   Objective    Interim History:   Telemetry: Sinus rhythm   Objective: Vitals:   04/18/17 1026 04/18/17 1100 04/18/17 1131 04/18/17 1200  BP: (!) 161/68 (!) 191/68 (!) 172/70 (!) 176/74  Pulse: 66 70 65 61  Resp:  13 12 10   Temp:      TempSrc:      SpO2:  99% 99% 99%  Weight:      Height:        Intake/Output Summary (Last 24 hours) at 04/18/17 1236 Last data filed at 04/18/17 1200  Gross per 24 hour  Intake          1486.17 ml  Output             3250 ml  Net         -1763.83 ml    Exam:  General: [id number] Cardiovascular: S1-S2 nsr Respiratory: some slight rales bilat no tvr or tvf Abdomen: Soft nontender, no rebound  Skin intact no lower extremity edema Neuro awake alert good power   Data Reviewed: Basic Metabolic Panel:  Recent Labs Lab 04/15/17 0438 04/16/17 0557  04/17/17 1201 04/17/17 1530 04/17/17 1911 04/18/17 0005 04/18/17 0448  NA 111* 103*  < > 108* 108* 111* 115* 118*  K 3.4* 3.6  --  3.1*  --   --   --   --   CL 74* 68*  --  77*  --   --   --   --   CO2  27 25  --  25  --   --   --   --   GLUCOSE 125* 143*  --  110*  --   --   --   --   BUN 14 11  --  8  --   --   --   --   CREATININE 0.65 0.56  --  0.36*  --   --   --   --   CALCIUM 10.0 7.8*  --  6.8*  --   --   --   --   MG  --   --   --  1.3*  --   --   --   --   < > = values in this interval not displayed. Liver Function Tests: No results for input(s): AST, ALT, ALKPHOS, BILITOT, PROT, ALBUMIN in the last 168 hours. No results for input(s): LIPASE, AMYLASE in the last 168 hours. No results for input(s): AMMONIA in the last 168 hours. CBC:  Recent Labs Lab 04/15/17 0438 04/16/17 0557  WBC 2.6* 3.2*  NEUTROABS 1.6*  --   HGB 11.5* 10.6*  HCT 30.3* 27.0*  MCV 92.1 88.8  PLT 267 240   Cardiac Enzymes:  Recent Labs Lab 04/15/17 1110 04/15/17 1651 04/15/17 2232  TROPONINI <0.03 <0.03 <0.03   BNP: Invalid input(s): POCBNP CBG: No results for input(s): GLUCAP in the last 168 hours.  Recent Results (from the past 240 hour(s))  Blood culture (routine x 2)     Status: None (Preliminary result)   Collection Time: 04/15/17  8:13 AM  Result Value Ref Range Status   Specimen Description BLOOD LEFT ANTECUBITAL  Final   Special Requests   Final    BOTTLES DRAWN AEROBIC AND ANAEROBIC Blood Culture adequate volume   Culture   Final    NO GROWTH 3 DAYS Performed at [facility], [address], [place], [place] [address]    Report Status PENDING  Incomplete  Blood culture (routine x 2)     Status: None (Preliminary result)   Collection Time: 04/15/17  8:14 AM  Result Value Ref Range Status   Specimen Description BLOOD LEFT HAND  Final   Special Requests   Final    BOTTLES DRAWN AEROBIC AND ANAEROBIC Blood Culture adequate volume   Culture   Final    NO GROWTH 3 DAYS Performed at [facility], [place] [address] [place], [place] [address]    Report Status PENDING  Incomplete  MRSA PCR Screening     Status: None   Collection Time: 04/17/17  9:30 AM  Result Value  Ref Range Status   MRSA by PCR NEGATIVE NEGATIVE Final    Comment:        The GeneXpert MRSA Assay (FDA approved for NASAL specimens only), is one component of a comprehensive MRSA colonization surveillance program. It is not intended to diagnose MRSA infection nor to guide or monitor treatment for MRSA infections.      Studies:              All Imaging reviewed and is as per above notation   Scheduled Meds: . clobetasol cream  1 application Topical BID  . dextromethorphan-guaiFENesin  2 tablet Oral Q12H  . labetalol  200 mg Oral BID  . letrozole  2.5 mg Oral Daily  . pantoprazole (PROTONIX) IV  40 mg Intravenous Q12H  . potassium chloride  40 mEq Oral Daily  . triamcinolone  1 application Mouth/Throat BID  . triamcinolone cream   Topical BID   Continuous Infusions: . ceFEPime (MAXIPIME) IV Stopped (04/18/17 0913)  . sodium chloride (hypertonic) 50 mL/hr at 04/18/17 1200     Assessment/Plan:  1. Symptomatic hyponatremia-possible combination of excessive free water given received D5 initially as well as natriuresis from thiazide diuretic-Will be checking q8 labs for sodium  Greatly appreciate nephrology guidance. 2. Toxic metabolic encephalopathy secondary to hyponatremia-resolving 3. Healthcare associated pneumonia-continue cefepime, continue vancomycin. Narrow to  levaquin 5/15 4. Hypertension-continue labetalol 200 twice a day, Cozaar 100 daily--prn hydralazine added 5/15 5. Reflux-give Protonix for GERD-converted to by mouth in a.m. 6. Hyperlipidemia discontinue statin for now-resume on discharge 7. Metastatic rest cancer on oral therapy-continue letrozole 2.5 daily   D/w husband Inpatient for 1-2 days and /or documented sodium stability Keep on SDU pending resolution of labs Expect we'll be able to transition and transferred to regular floor in the next 24-48 hours  Verneita Griffes, MD  [facility] Pager [phone] 04/18/2017, 12:36 PM    LOS: 3 days

## 2017-04-18 NOTE — Progress Notes (Signed)
  [facility] Progress Note   Subjective: Na 116 now, pt more alert and fully oriented this am.  Still lethargic.  Vitals:   04/18/17 1305 04/18/17 1400 04/18/17 1500 04/18/17 1600  BP: (!) 174/64 (!) 119/42 (!) 156/56 (!) 146/57  Pulse:  66 70 68  Resp:  10 12 11   Temp:      TempSrc:      SpO2:  99% 98% 98%  Weight:      Height:        Inpatient medications: . clobetasol cream  1 application Topical BID  . dextromethorphan-guaiFENesin  2 tablet Oral Q12H  . labetalol  200 mg Oral BID  . letrozole  2.5 mg Oral Daily  . levofloxacin  500 mg Oral QPM  . pantoprazole (PROTONIX) IV  40 mg Intravenous Q12H  . potassium chloride  40 mEq Oral Daily  . triamcinolone  1 application Mouth/Throat BID  . triamcinolone cream   Topical BID   . sodium chloride (hypertonic) 65 mL/hr at 04/18/17 1600   acetaminophen, benzonatate, hydrALAZINE, ondansetron (ZOFRAN) IV  Exam: Opens eyes, doesn't follow many commands, falls right back asleep No jvd Chest CTAB RRR  ABD soft ntnd Ext no edema  Urine Na 86  Uosm 568     Assessment: 1   Acute on chronic hyponatremia - cont 3% saline, increasing to 65 cc/hr. MS changes are resolving. Continue Na checks every four hrs for now.  2  Metastatic breast Ca  Plan - as above   Kelly Splinter MD [place] pager [phone]   04/18/2017, 4:16 PM    Recent Labs Lab 04/16/17 0557  04/17/17 1201  04/18/17 0005 04/18/17 0448 04/18/17 1338  NA 103*  < > 108*  < > 115* 118* 116*  K 3.6  --  3.1*  --   --   --  3.4*  CL 68*  --  77*  --   --   --  87*  CO2 25  --  25  --   --   --  24  GLUCOSE 143*  --  110*  --   --   --  108*  BUN 11  --  8  --   --   --  6  CREATININE 0.56  --  0.36*  --   --   --  0.34*  CALCIUM 7.8*  --  6.8*  --   --   --  6.7*  < > = values in this interval not displayed. No results for input(s): AST, ALT, ALKPHOS, BILITOT, PROT, ALBUMIN in the last 168 hours.  Recent Labs Lab  04/15/17 0438 04/16/17 0557  WBC 2.6* 3.2*  NEUTROABS 1.6*  --   HGB 11.5* 10.6*  HCT 30.3* 27.0*  MCV 92.1 88.8  PLT 267 240   Iron/TIBC/Ferritin/ %Sat No results found for: IRON, TIBC, FERRITIN, IRONPCTSAT

## 2017-04-18 NOTE — Progress Notes (Signed)
CRITICAL VALUE ALERT  Critical value received: Na+ 118  Date of notification: 04/18/17  Time of notification: 0530  Critical value read back:Yes.    Nurse who received alert:  Darrin Nipper, RN  MD notified (1st page): Lorrene Reid  Time of first page: [phone]  Responding MD: Lorrene Reid  Time MD responded: [phone]

## 2017-04-18 NOTE — Progress Notes (Signed)
CRITICAL VALUE ALERT  Critical value received: Na+ 111  Date of notification: 04/17/17  Time of notification: 2030  Critical value read back:Yes.    Nurse who received alert:  Darrin Nipper, RN  MD notified (1st page): Lorrene Reid  Time of first page: 2045  Responding MD: Lorrene Reid  Time MD responded: 2046

## 2017-04-19 LAB — BASIC METABOLIC PANEL
ANION GAP: 6 (ref 5–15)
BUN: <5 mg/dL — ABNORMAL LOW (ref 6–20)
CALCIUM: 6.3 mg/dL — AB (ref 8.9–10.3)
CO2: 24 mmol/L (ref 22–32)
Chloride: 93 mmol/L — ABNORMAL LOW (ref 101–111)
Creatinine, Ser: 0.34 mg/dL — ABNORMAL LOW (ref 0.44–1.00)
Glucose, Bld: 106 mg/dL — ABNORMAL HIGH (ref 65–99)
POTASSIUM: 3.2 mmol/L — AB (ref 3.5–5.1)
Sodium: 123 mmol/L — ABNORMAL LOW (ref 135–145)

## 2017-04-19 LAB — SODIUM
Sodium: 123 mmol/L — ABNORMAL LOW (ref 135–145)
Sodium: 126 mmol/L — ABNORMAL LOW (ref 135–145)

## 2017-04-19 MED ORDER — AMLODIPINE BESYLATE 5 MG PO TABS
5.0000 mg | ORAL_TABLET | Freq: Every day | ORAL | Status: DC
  Administered 2017-04-19 – 2017-04-21 (×3): 5 mg via ORAL
  Filled 2017-04-19 (×3): qty 1

## 2017-04-19 MED ORDER — SODIUM CHLORIDE 0.9 % IV SOLN
INTRAVENOUS | Status: DC
  Administered 2017-04-19 (×2): via INTRAVENOUS

## 2017-04-19 MED ORDER — POTASSIUM CHLORIDE CRYS ER 20 MEQ PO TBCR
20.0000 meq | EXTENDED_RELEASE_TABLET | Freq: Once | ORAL | Status: AC
  Administered 2017-04-19: 20 meq via ORAL
  Filled 2017-04-19: qty 1

## 2017-04-19 MED ORDER — PANTOPRAZOLE SODIUM 40 MG PO TBEC
40.0000 mg | DELAYED_RELEASE_TABLET | Freq: Two times a day (BID) | ORAL | Status: DC
  Administered 2017-04-19 – 2017-04-23 (×9): 40 mg via ORAL
  Filled 2017-04-19 (×9): qty 1

## 2017-04-19 MED ORDER — FUROSEMIDE 20 MG PO TABS
20.0000 mg | ORAL_TABLET | Freq: Every day | ORAL | Status: DC
  Administered 2017-04-19: 20 mg via ORAL
  Filled 2017-04-19: qty 1

## 2017-04-19 NOTE — Progress Notes (Signed)
PROGRESS NOTE [facility]   Jessica Zavala   XKG:[id number] DOB: 03-25-1946  DOA: 04/15/2017 PCP: [name], MD   Brief Narrative:  71 year old female is hypertension on HCTZ with metastatic breast cancer admitted on 04/16/2017 with one week history of cough and sputum and X-ray concerning of pneumonia patient was admitted for antibiotic treatment subsequent sodium was found to be 103 with symptoms of lethargy 7 confusion patient was transferred to stepdown. Patient being treated with hypertonic saline mentally improved.   Subjective: Patient seen and examined with husband at bedside. Per husband her mental status is back to baseline. She has no concerns at this moment. She denies chest pain, shortness of breath, dizziness, nausea and vomiting.  Assessment & Plan: Acute on chronic Symptomatic Hyponatremia  Na+ continues to improves - Most recent 126 Mentally back to baseline per husband  Will switch 3% to normal saline at 50 cc per hr  Continue Lasix per nephrology Renal recommendations appreciated  OOB as tolerated   Metabolic encephalopathy - resolved, due to hypoNa+  HTN above goal  On Labetalol 200 mg BID  Will add Norvasc  Monitor BP   CAP  Initially treated with Vanc (3 days) and cefepime (4 days) Now on Levaquin will complete a 5 day course  Repeat CXR in 3-4 weeks to assure resolution of xray findings   Metastatic breast cancer   Per oncology   DVT prophylaxis:  Code Status: Full  Family Communication: Husband at bedside  Disposition Plan: Likely to go home in 1-2 days, can move to tele   Consultants:   Nephrology   Oncology   Procedures:   None   Antimicrobials: Anti-infectives    Start     Dose/Rate Route Frequency Ordered Stop   04/18/17 1800  levofloxacin (LEVAQUIN) tablet 500 mg     500 mg Oral Every evening 04/18/17 1240     04/15/17 2100  vancomycin (VANCOCIN) 500 mg in sodium chloride 0.9 % 100 mL IVPB  Status:  Discontinued     500  mg 100 mL/hr over 60 Minutes Intravenous Every 12 hours 04/15/17 1100 04/17/17 1329   04/15/17 1600  ceFEPIme (MAXIPIME) 1 g in dextrose 5 % 50 mL IVPB  Status:  Discontinued     1 g 100 mL/hr over 30 Minutes Intravenous Every 8 hours 04/15/17 1038 04/18/17 1240   04/15/17 0715  vancomycin (VANCOCIN) IVPB 1000 mg/200 mL premix     1,000 mg 200 mL/hr over 60 Minutes Intravenous  Once 04/15/17 0713 04/15/17 1036   04/15/17 0715  ceFEPIme (MAXIPIME) 1 g in dextrose 5 % 50 mL IVPB     1 g 100 mL/hr over 30 Minutes Intravenous  Once 04/15/17 0713 04/15/17 0917        Objective: Vitals:   04/19/17 0200 04/19/17 0257 04/19/17 0400 04/19/17 0500  BP: (!) 176/76 (!) 176/76 (!) 171/60 (!) 144/59  Pulse: 68  76 78  Resp: 11  12 14   Temp:   98.3 F (36.8 C)   TempSrc:   Oral   SpO2: 100%  99% 97%  Weight:      Height:        Intake/Output Summary (Last 24 hours) at 04/19/17 0725 Last data filed at 04/19/17 0600  Gross per 24 hour  Intake          2167.67 ml  Output             2750 ml  Net          -  582.33 ml   Filed Weights   04/15/17 0408 04/15/17 1036  Weight: 54.4 kg (120 lb) 55.9 kg (123 lb 3.8 oz)    Examination:  General exam: Appears calm and comfortable  HEENT: OP moist and clear Respiratory system: Clear to auscultation. No wheezes,crackle or rhonchi Cardiovascular system: S1 & S2 heard, RRR. No JVD, murmurs, rubs or gallops Gastrointestinal system: Abdomen is nondistended, soft and nontender. No organomegaly or masses felt.  Central nervous system: Alert and oriented. No focal neurological deficits. Extremities: No pedal edema Skin: No rashes, lesions or ulcers Psychiatry: Judgement and insight appear normal. Mood & affect appropriate.    Data Reviewed: I have personally reviewed following labs and imaging studies  CBC:  Recent Labs Lab 04/15/17 0438 04/16/17 0557  WBC 2.6* 3.2*  NEUTROABS 1.6*  --   HGB 11.5* 10.6*  HCT 30.3* 27.0*  MCV 92.1 88.8   PLT 267 716   Basic Metabolic Panel:  Recent Labs Lab 04/15/17 0438 04/16/17 0557  04/17/17 1201  04/18/17 0448 04/18/17 1338 04/18/17 1857 04/18/17 2255 04/19/17 0306  NA 111* 103*  < > 108*  < > 118* 116* 119* 119* 123*  123*  K 3.4* 3.6  --  3.1*  --   --  3.4*  --   --  3.2*  CL 74* 68*  --  77*  --   --  87*  --   --  93*  CO2 27 25  --  25  --   --  24  --   --  24  GLUCOSE 125* 143*  --  110*  --   --  108*  --   --  106*  BUN 14 11  --  8  --   --  6  --   --  <5*  CREATININE 0.65 0.56  --  0.36*  --   --  0.34*  --   --  0.34*  CALCIUM 10.0 7.8*  --  6.8*  --   --  6.7*  --   --  6.3*  MG  --   --   --  1.3*  --   --   --   --   --   --   < > = values in this interval not displayed. GFR: Estimated Creatinine Clearance: 49.9 mL/min (A) (by C-G formula based on SCr of 0.34 mg/dL (L)).  Cardiac Enzymes:  Recent Labs Lab 04/15/17 1110 04/15/17 1651 04/15/17 2232  TROPONINI <0.03 <0.03 <0.03   Thyroid Function Tests:  Recent Labs  04/16/17 0752  TSH 1.371   Anemia Panel: No results for input(s): VITAMINB12, FOLATE, FERRITIN, TIBC, IRON, RETICCTPCT in the last 72 hours. Sepsis Labs: No results for input(s): PROCALCITON, LATICACIDVEN in the last 168 hours.  Recent Results (from the past 240 hour(s))  Blood culture (routine x 2)     Status: None (Preliminary result)   Collection Time: 04/15/17  8:13 AM  Result Value Ref Range Status   Specimen Description BLOOD LEFT ANTECUBITAL  Final   Special Requests   Final    BOTTLES DRAWN AEROBIC AND ANAEROBIC Blood Culture adequate volume   Culture   Final    NO GROWTH 3 DAYS Performed at [facility], [address], [place], [place] [address]    Report Status PENDING  Incomplete  Blood culture (routine x 2)     Status: None (Preliminary result)   Collection Time: 04/15/17  8:14 AM  Result  Value Ref Range Status   Specimen Description BLOOD LEFT HAND  Final   Special Requests   Final    BOTTLES DRAWN  AEROBIC AND ANAEROBIC Blood Culture adequate volume   Culture   Final    NO GROWTH 3 DAYS Performed at [facility], [address], [place], [place] [address]    Report Status PENDING  Incomplete  MRSA PCR Screening     Status: None   Collection Time: 04/17/17  9:30 AM  Result Value Ref Range Status   MRSA by PCR NEGATIVE NEGATIVE Final    Comment:        The GeneXpert MRSA Assay (FDA approved for NASAL specimens only), is one component of a comprehensive MRSA colonization surveillance program. It is not intended to diagnose MRSA infection nor to guide or monitor treatment for MRSA infections.       Radiology Studies: No results found.   Scheduled Meds: . clobetasol cream  1 application Topical BID  . dextromethorphan-guaiFENesin  2 tablet Oral Q12H  . labetalol  200 mg Oral BID  . letrozole  2.5 mg Oral Daily  . levofloxacin  500 mg Oral QPM  . pantoprazole (PROTONIX) IV  40 mg Intravenous Q12H  . potassium chloride  40 mEq Oral Daily  . triamcinolone  1 application Mouth/Throat BID  . triamcinolone cream   Topical BID   Continuous Infusions: . sodium chloride (hypertonic) 65 mL/hr (04/19/17 0259)     LOS: 4 days    Chipper Oman, MD Pager: Text Page via [email or web address]  [phone]  If 7PM-7AM, please contact night-coverage [email or web address] Password [email or web address] 04/19/2017, 7:25 AM

## 2017-04-19 NOTE — Progress Notes (Signed)
CRITICAL VALUE ALERT  Critical value received:  NA 119  Date of notification:  04/18/2017  Time of notification:  1938pm  Critical value read back: yes  Nurse who received alert:  Dyann Ruddle, RN  MD notified (1st page):  TRIAD  Time of first page:  2047  MD notified (2nd page):  Time of second page:  Responding MD:    Time MD responded:

## 2017-04-19 NOTE — Progress Notes (Signed)
PHARMACIST - PHYSICIAN COMMUNICATION  DR:   Patrecia Pour  CONCERNING: IV to Oral Route Change Policy  RECOMMENDATION: This patient is receiving Protonix by the intravenous route.  Based on criteria approved by the Pharmacy and Therapeutics Committee, the intravenous medication(s) is/are being converted to the equivalent oral dose form(s).   DESCRIPTION: These criteria include:  The patient is eating (either orally or via tube) and/or has been taking other orally administered medications for a least 24 hours  The patient has no evidence of active gastrointestinal bleeding or impaired GI absorption (gastrectomy, short bowel, patient on TNA or NPO).  If you have questions about this conversion, please contact the Pharmacy Department  []   [phone] )  [facility] []   [phone] )  [facility] []   [phone] )  [facility] []   [phone] )  [facility] [x]   [phone] )  [place], [place], [facility] 04/19/2017 8:03 AM

## 2017-04-19 NOTE — Progress Notes (Signed)
CRITICAL VALUE ALERT  Critical value received:  NA 119  Date of notification:  04/18/2017  Time of notification:  2325  Critical value read back: yes  Nurse who received alert:  Dyann Ruddle, RN  MD notified (1st page):  TRIAD  Time of first page:  2329  MD notified (2nd page):  Time of second page:  Responding MD:    Time MD responded:

## 2017-04-19 NOTE — Progress Notes (Signed)
CRITICAL VALUE ALERT  Critical value received:  CA 6.3  Date of notification:  04/19/2017  Time of notification:  0423 am  Critical value read back: yes  Nurse who received alert:  Dyann Ruddle, RN  MD notified (1st page):  TRIAD  Time of first page:  0423 am  MD notified (2nd page):  Time of second page:  Responding MD:  TRIAD  Time MD responded:  9692 am

## 2017-04-19 NOTE — Progress Notes (Signed)
Physical Therapy Treatment Patient Details Name: [name] MRN: [id number] DOB: 1946/01/01 Today's Date: 04/19/2017    History of Present Illness  Jessica Zavala is a 71 y.o. female with known breast cancer on oral tx, presented to [facility] 04/15/17 with one week in duration progressively worsening productive cough , LLLpna, hyponatremia.    PT Comments    Sodium is 126. The patient is slightly more alert. Asked when she will walk again. Continue  PT. Gait remains very unsteady with RW. Very flat affect. Spouse and Grandson present to assist and interpret.  Follow Up Recommendations  Home health PT;Supervision/Assistance - 24 hour     Equipment Recommendations  3in1 (PT);Rolling walker with 5" wheels    Recommendations for Other Services       Precautions / Restrictions Precautions Precaution Comments: monitor BP, check sodium results for the AM, has been critical.    Mobility  Bed Mobility               General bed mobility comments: in recliner  Transfers Overall transfer level: Needs assistance Equipment used: Rolling walker (2 wheeled) Transfers: Sit to/from Stand Sit to Stand: Mod assist         General transfer comment: patient is wobbly, swaying  upon standing, steady assist   for balance.  Ambulation/Gait Ambulation/Gait assistance: Mod assist;+2 safety/equipment Ambulation Distance (Feet): 90 Feet (x 2) Assistive device: Rolling walker (2 wheeled) Gait Pattern/deviations: Staggering left;Staggering right;Drifts right/left     General Gait Details: requiresassistance for balance during ambulation, assistance with RW to keep going in straight line and turning corners. Followed with the recliner for rest break. Sats 100% RA, HR 75, BP after return 115/53.   Stairs            Wheelchair Mobility    Modified Rankin (Stroke Patients Only)       Balance Overall balance assessment: Needs assistance Sitting-balance support: No upper extremity  supported;Feet supported Sitting balance-Leahy Scale: Fair     Standing balance support: During functional activity;Bilateral upper extremity supported Standing balance-Leahy Scale: Poor                              Cognition Arousal/Alertness: Lethargic Behavior During Therapy: Flat affect Overall Cognitive Status: Difficult to assess Area of Impairment: Following commands                       Following Commands: Follows multi-step commands with increased time       General Comments: patient is more verbal today, Jessica Zavala is present and interpreted. patient asked when she will walk again per grandson.       Exercises      General Comments        Pertinent Vitals/Pain Pain Assessment: Faces Faces Pain Scale: No hurt    Home Living                      Prior Function            PT Goals (current goals can now be found in the care plan section) Progress towards PT goals: Progressing toward goals    Frequency    Min 3X/week      PT Plan Current plan remains appropriate    Co-evaluation              AM-PAC PT "6 Clicks" Daily Activity  Outcome Measure  Difficulty turning  over in bed (including adjusting bedclothes, sheets and blankets)?: A Little Difficulty moving from lying on back to sitting on the side of the bed? : A Little Difficulty sitting down on and standing up from a chair with arms (e.g., wheelchair, bedside commode, etc,.)?: A Little Help needed moving to and from a bed to chair (including a wheelchair)?: A Little Help needed walking in hospital room?: A Lot Help needed climbing 3-5 steps with a railing? : A Lot 6 Click Score: 16    End of Session Equipment Utilized During Treatment: Gait belt Activity Tolerance: Patient tolerated treatment well Patient left: in chair;with call bell/phone within reach;with family/visitor present Nurse Communication: Mobility status PT Visit Diagnosis: Unsteadiness on  feet (R26.81)     Time: 0932-3557 PT Time Calculation (min) (ACUTE ONLY): 21 min  Charges:  $Gait Training: 8-22 mins                    G CodesTresa Zavala PT [place] 04/19/2017, 12:43 PM

## 2017-04-19 NOTE — Progress Notes (Addendum)
  [facility] Progress Note   Subjective: Na 126, more alert and walked with PT in halls.  Vitals:   04/19/17 1100 04/19/17 1200 04/19/17 1300 04/19/17 1400  BP: 128/63 (!) 112/53 120/66 (!) 116/57  Pulse: 62 (!) 58 67 65  Resp: 10 12 17 15   Temp:  97.7 F (36.5 C)    TempSrc:  Oral    SpO2: 98% 99% 98% 99%  Weight:      Height:        Inpatient medications: . amLODipine  5 mg Oral Daily  . clobetasol cream  1 application Topical BID  . dextromethorphan-guaiFENesin  2 tablet Oral Q12H  . furosemide  20 mg Oral Daily  . labetalol  200 mg Oral BID  . letrozole  2.5 mg Oral Daily  . levofloxacin  500 mg Oral QPM  . pantoprazole  40 mg Oral BID AC  . potassium chloride  40 mEq Oral Daily  . triamcinolone  1 application Mouth/Throat BID  . triamcinolone cream   Topical BID   . sodium chloride 50 mL/hr at 04/19/17 1400   acetaminophen, benzonatate, hydrALAZINE, ondansetron (ZOFRAN) IV  Exam: Alert, no distress No jvd Chest CTAB RRR  ABD soft ntnd Ext no edema  Urine Na 86  Uosm 568     Assessment: 1  Acute on chronic hyponatremia - improving, Na 126.  DC'd 3% saline, adding low-dose po lasix and fluid restriction. Consider salt tabs if needed.  2  Metastatic breast Ca  Plan - as above   Kelly Splinter MD [place] pager [phone]   04/19/2017, 4:18 PM    Recent Labs Lab 04/17/17 1201  04/18/17 1338  04/18/17 2255 04/19/17 0306 04/19/17 0754  NA 108*  < > 116*  < > 119* 123*  123* 126*  K 3.1*  --  3.4*  --   --  3.2*  --   CL 77*  --  87*  --   --  93*  --   CO2 25  --  24  --   --  24  --   GLUCOSE 110*  --  108*  --   --  106*  --   BUN 8  --  6  --   --  <5*  --   CREATININE 0.36*  --  0.34*  --   --  0.34*  --   CALCIUM 6.8*  --  6.7*  --   --  6.3*  --   < > = values in this interval not displayed. No results for input(s): AST, ALT, ALKPHOS, BILITOT, PROT, ALBUMIN in the last 168 hours.  Recent Labs Lab  04/15/17 0438 04/16/17 0557  WBC 2.6* 3.2*  NEUTROABS 1.6*  --   HGB 11.5* 10.6*  HCT 30.3* 27.0*  MCV 92.1 88.8  PLT 267 240   Iron/TIBC/Ferritin/ %Sat No results found for: IRON, TIBC, FERRITIN, IRONPCTSAT

## 2017-04-20 LAB — BASIC METABOLIC PANEL
Anion gap: 10 (ref 5–15)
BUN: 7 mg/dL (ref 6–20)
CALCIUM: 6.9 mg/dL — AB (ref 8.9–10.3)
CHLORIDE: 83 mmol/L — AB (ref 101–111)
CO2: 24 mmol/L (ref 22–32)
CREATININE: 0.37 mg/dL — AB (ref 0.44–1.00)
GFR calc non Af Amer: >60 mL/min (ref >60)
Glucose, Bld: 88 mg/dL (ref 65–99)
Potassium: 3.2 mmol/L — ABNORMAL LOW (ref 3.5–5.1)
SODIUM: 117 mmol/L — AB (ref 135–145)

## 2017-04-20 LAB — CULTURE, BLOOD (ROUTINE X 2)
Culture: NO GROWTH
Culture: NO GROWTH
SPECIAL REQUESTS: ADEQUATE
Special Requests: ADEQUATE

## 2017-04-20 LAB — CBC
HCT: 24 % — ABNORMAL LOW (ref 36.0–46.0)
Hemoglobin: 9 g/dL — ABNORMAL LOW (ref 12.0–15.0)
MCH: 34.7 pg — AB (ref 26.0–34.0)
MCHC: 37.5 g/dL — ABNORMAL HIGH (ref 30.0–36.0)
MCV: 92.7 fL (ref 78.0–100.0)
PLATELETS: 208 10*3/uL (ref 150–400)
RBC: 2.59 MIL/uL — ABNORMAL LOW (ref 3.87–5.11)
RDW: 14.2 % (ref 11.5–15.5)
WBC: 2.5 10*3/uL — ABNORMAL LOW (ref 4.0–10.5)

## 2017-04-20 LAB — SODIUM
SODIUM: 116 mmol/L — AB (ref 135–145)
Sodium: 125 mmol/L — ABNORMAL LOW (ref 135–145)

## 2017-04-20 MED ORDER — TOLVAPTAN 15 MG PO TABS
15.0000 mg | ORAL_TABLET | ORAL | Status: DC
  Administered 2017-04-20: 15 mg via ORAL
  Filled 2017-04-20 (×2): qty 1

## 2017-04-20 MED ORDER — SODIUM CHLORIDE 1 G PO TABS
1.0000 g | ORAL_TABLET | Freq: Three times a day (TID) | ORAL | Status: DC
  Administered 2017-04-20: 1 g via ORAL
  Filled 2017-04-20: qty 1

## 2017-04-20 NOTE — Care Management Important Message (Signed)
Important Message  Patient Details  Name: MILLIANI HERRADA MRN: [id number] Date of Birth: 01-31-1946   Medicare Important Message Given:  Yes    Kerin Salen 04/20/2017, 10:57 AMImportant Message  Patient Details  Name: MALEIA WEEMS MRN: [id number] Date of Birth: [birth year]   Medicare Important Message Given:  Yes    Kerin Salen 04/20/2017, 10:57 AM

## 2017-04-20 NOTE — Care Management Note (Signed)
Case Management Note  Patient Details  Name: KRYSTIAN FERRENTINO MRN: [id number] Date of Birth: 02-07-1946  Subjective/Objective:    71 yo admitted with PNA        Action/Plan: From home with spouse. This CM met with pt and husband at bedside to discuss DC needs. This CM was asked to call pt son [name] to interpret. Per [name], they are interested in HHPT and when choice offered, [facility] chosen. Pt also requesting RW. Order received for RW. [facility] alerted of referral for HHPT and need for RW.  Expected Discharge Date:  04/16/17               Expected Discharge Plan:  [place]  In-House Referral:     Discharge planning Services  CM Consult  Post Acute Care Choice:  Home Health Choice offered to:  Adult Children  DME Arranged:  Walker rolling DME Agency:     HH Arranged:  PT HH Agency:  [facility]  Status of Service:  In process, will continue to follow  If discussed at Long Length of Stay Meetings, dates discussed:    Additional CommentsLynnell Catalan, RN 04/20/2017, 1:51 PM  [phone]

## 2017-04-20 NOTE — Progress Notes (Addendum)
PROGRESS NOTE [facility]   Jessica Zavala   LAG:[id number] DOB: 1946/05/31  DOA: 04/15/2017 PCP: [name], MD   Brief Narrative:  71 year old female is hypertension on HCTZ with metastatic breast cancer admitted on 04/16/2017 with one week history of cough and sputum and X-ray concerning of pneumonia patient was admitted for antibiotic treatment subsequent sodium was found to be 103 with symptoms of lethargy 7 confusion patient was transferred to stepdown. Patient being treated with hypertonic saline mentally improved.   Subjective: Patient doing well, have no complaints. Patient wants to go home. Patient was started on Tolvaptan by nephro.   Assessment & Plan: Acute on chronic Symptomatic Hyponatremia - presumed SIADH  Na+ decreased today - started on [place]  Mentally back to baseline per husband  IVF discontinued  Continue management per nephrology   Check Na+ q 12 hrs  OOB as tolerated   Metabolic encephalopathy - resolved, due to hypoNa+  HTN - improved  On Labetalol 200 mg BID  Continue Norvasc as BP improved Monitor BP   CAP  Initially treated with Vanc (3 days) and cefepime (4 days) Now on Levaquin will complete a 5 day course  Repeat CXR in 3-4 weeks to assure resolution of xray findings   Metastatic breast cancer   Per oncology   DVT prophylaxis:  Code Status: Full  Family Communication: Husband at bedside  Disposition Plan: Likely to go home in 1-2 days, can move to tele   Consultants:   Nephrology   Oncology   Procedures:   None   Antimicrobials: Anti-infectives    Start     Dose/Rate Route Frequency Ordered Stop   04/18/17 1800  levofloxacin (LEVAQUIN) tablet 500 mg     500 mg Oral Every evening 04/18/17 1240 04/23/17 1759   04/15/17 2100  vancomycin (VANCOCIN) 500 mg in sodium chloride 0.9 % 100 mL IVPB  Status:  Discontinued     500 mg 100 mL/hr over 60 Minutes Intravenous Every 12 hours 04/15/17 1100 04/17/17 1329   04/15/17 1600   ceFEPIme (MAXIPIME) 1 g in dextrose 5 % 50 mL IVPB  Status:  Discontinued     1 g 100 mL/hr over 30 Minutes Intravenous Every 8 hours 04/15/17 1038 04/18/17 1240   04/15/17 0715  vancomycin (VANCOCIN) IVPB 1000 mg/200 mL premix     1,000 mg 200 mL/hr over 60 Minutes Intravenous  Once 04/15/17 0713 04/15/17 1036   04/15/17 0715  ceFEPIme (MAXIPIME) 1 g in dextrose 5 % 50 mL IVPB     1 g 100 mL/hr over 30 Minutes Intravenous  Once 04/15/17 0713 04/15/17 0917       Objective: Vitals:   04/19/17 2228 04/20/17 0458 04/20/17 0731 04/20/17 1100  BP: (!) 155/66 (!) 177/66 (!) 164/56 122/60  Pulse: 78 74 72   Resp: 16 18    Temp: 97.9 F (36.6 C) 97.8 F (36.6 C)    TempSrc: Oral Oral    SpO2: 99% 99%    Weight:      Height:        Intake/Output Summary (Last 24 hours) at 04/20/17 1613 Last data filed at 04/20/17 0600  Gross per 24 hour  Intake              700 ml  Output                0 ml  Net              700 ml  Filed Weights   04/15/17 0408 04/15/17 1036  Weight: 54.4 kg (120 lb) 55.9 kg (123 lb 3.8 oz)    Examination:  General exam: NAD   Respiratory system: CTA bilateral Cardiovascular system: S1-S2, RRR, no murmurs or gallops Gastrointestinal system: Abdomen soft nontender nondistended Central nervous system: AAOx3 Extremities: No LE edema  Skin: No lesions noted   Data Reviewed: I have personally reviewed following labs and imaging studies  CBC:  Recent Labs Lab 04/15/17 0438 04/16/17 0557 04/20/17 0506  WBC 2.6* 3.2* 2.5*  NEUTROABS 1.6*  --   --   HGB 11.5* 10.6* 9.0*  HCT 30.3* 27.0* 24.0*  MCV 92.1 88.8 92.7  PLT 267 240 161   Basic Metabolic Panel:  Recent Labs Lab 04/16/17 0557  04/17/17 1201  04/18/17 1338  04/18/17 2255 04/19/17 0306 04/19/17 0754 04/20/17 0506 04/20/17 1000  NA 103*  < > 108*  < > 116*  < > 119* 123*  123* 126* 117* 116*  K 3.6  --  3.1*  --  3.4*  --   --  3.2*  --  3.2*  --   CL 68*  --  77*  --  87*  --    --  93*  --  83*  --   CO2 25  --  25  --  24  --   --  24  --  24  --   GLUCOSE 143*  --  110*  --  108*  --   --  106*  --  88  --   BUN 11  --  8  --  6  --   --  <5*  --  7  --   CREATININE 0.56  --  0.36*  --  0.34*  --   --  0.34*  --  0.37*  --   CALCIUM 7.8*  --  6.8*  --  6.7*  --   --  6.3*  --  6.9*  --   MG  --   --  1.3*  --   --   --   --   --   --   --   --   < > = values in this interval not displayed. GFR: Estimated Creatinine Clearance: 49.9 mL/min (A) (by C-G formula based on SCr of 0.37 mg/dL (L)).  Cardiac Enzymes:  Recent Labs Lab 04/15/17 1110 04/15/17 1651 04/15/17 2232  TROPONINI <0.03 <0.03 <0.03   Thyroid Function Tests: No results for input(s): TSH, T4TOTAL, FREET4, T3FREE, THYROIDAB in the last 72 hours. Anemia Panel: No results for input(s): VITAMINB12, FOLATE, FERRITIN, TIBC, IRON, RETICCTPCT in the last 72 hours. Sepsis Labs: No results for input(s): PROCALCITON, LATICACIDVEN in the last 168 hours.  Recent Results (from the past 240 hour(s))  Blood culture (routine x 2)     Status: None   Collection Time: 04/15/17  8:13 AM  Result Value Ref Range Status   Specimen Description BLOOD LEFT ANTECUBITAL  Final   Special Requests   Final    BOTTLES DRAWN AEROBIC AND ANAEROBIC Blood Culture adequate volume   Culture   Final    NO GROWTH 5 DAYS Performed at [facility], [address], [place], [place] [address]    Report Status 04/20/2017 FINAL  Final  Blood culture (routine x 2)     Status: None   Collection Time: 04/15/17  8:14 AM  Result Value Ref Range Status   Specimen Description BLOOD  LEFT HAND  Final   Special Requests   Final    BOTTLES DRAWN AEROBIC AND ANAEROBIC Blood Culture adequate volume   Culture   Final    NO GROWTH 5 DAYS Performed at [facility], [address], [place], [place] [address]    Report Status 04/20/2017 FINAL  Final  MRSA PCR Screening     Status: None   Collection Time: 04/17/17  9:30 AM    Result Value Ref Range Status   MRSA by PCR NEGATIVE NEGATIVE Final    Comment:        The GeneXpert MRSA Assay (FDA approved for NASAL specimens only), is one component of a comprehensive MRSA colonization surveillance program. It is not intended to diagnose MRSA infection nor to guide or monitor treatment for MRSA infections.       Radiology Studies: No results found.   Scheduled Meds: . amLODipine  5 mg Oral Daily  . clobetasol cream  1 application Topical BID  . dextromethorphan-guaiFENesin  2 tablet Oral Q12H  . labetalol  200 mg Oral BID  . letrozole  2.5 mg Oral Daily  . levofloxacin  500 mg Oral QPM  . pantoprazole  40 mg Oral BID AC  . potassium chloride  40 mEq Oral Daily  . tolvaptan  15 mg Oral Q24H  . triamcinolone  1 application Mouth/Throat BID  . triamcinolone cream   Topical BID   Continuous Infusions:    LOS: 5 days    Chipper Oman, MD Pager: Text Page via [email or web address]  [phone]  If 7PM-7AM, please contact night-coverage [email or web address] Password [facility] 04/20/2017, 4:13 PM

## 2017-04-20 NOTE — Progress Notes (Signed)
CRITICAL VALUE ALERT  Critical value received:  NA 116  Date of notification:  04/20/17 Time of notification:  8280  Critical value read back: yes  Nurse who received alert:  Felicity Coyer, RN  MD notified (1st page):  Quincy Simmonds, during bedside rounds  Time of first page:   MD notified (2nd page):  Time of second page:  Responding MD: silva  Time MD responded:  [id number]

## 2017-04-20 NOTE — Progress Notes (Signed)
  [facility] Progress Note   Subjective: Na down to 116, more alert and walking by herself in her room. Looks much better.  She got IV normal saline all day yesterday at 50/ hr.   Vitals:   04/19/17 2228 04/20/17 0458 04/20/17 0731 04/20/17 1100  BP: (!) 155/66 (!) 177/66 (!) 164/56 122/60  Pulse: 78 74 72   Resp: 16 18    Temp: 97.9 F (36.6 C) 97.8 F (36.6 C)    TempSrc: Oral Oral    SpO2: 99% 99%    Weight:      Height:        Inpatient medications: . amLODipine  5 mg Oral Daily  . clobetasol cream  1 application Topical BID  . dextromethorphan-guaiFENesin  2 tablet Oral Q12H  . labetalol  200 mg Oral BID  . letrozole  2.5 mg Oral Daily  . levofloxacin  500 mg Oral QPM  . pantoprazole  40 mg Oral BID AC  . potassium chloride  40 mEq Oral Daily  . tolvaptan  15 mg Oral Q24H  . triamcinolone  1 application Mouth/Throat BID  . triamcinolone cream   Topical BID    acetaminophen, benzonatate, hydrALAZINE, ondansetron (ZOFRAN) IV  Exam: Alert, no distress No jvd Chest CTAB RRR  ABD soft ntnd Ext no edema  Urine Na 86  Uosm 568     Assessment: 1  Acute on chronic hyponatremia - improved with hypertonic saline, stopped yesterday.  Na down today, mostly due to normal saline added yesterday -- patients with SIADH should not get normal saline, this will lower the Na+ levels as the pt's are water avid and will hold onto the water more than the salt in normal saline.  Have ordered tolvaptan 15 mg to start today.  After Na is better (> 125 ) will add other therapies that can be used in OP setting (salt tabs, fluid restrict, etc.).  AVOID all IVF's unless approved by renal .   2  Metastatic breast Ca  Plan - as above   Kelly Splinter MD [place] pager [phone]   04/20/2017, 5:14 PM    Recent Labs Lab 04/18/17 1338  04/19/17 0306 04/19/17 0754 04/20/17 0506 04/20/17 1000  NA 116*  < > 123*  123* 126* 117* 116*  K 3.4*  --   3.2*  --  3.2*  --   CL 87*  --  93*  --  83*  --   CO2 24  --  24  --  24  --   GLUCOSE 108*  --  106*  --  88  --   BUN 6  --  <5*  --  7  --   CREATININE 0.34*  --  0.34*  --  0.37*  --   CALCIUM 6.7*  --  6.3*  --  6.9*  --   < > = values in this interval not displayed. No results for input(s): AST, ALT, ALKPHOS, BILITOT, PROT, ALBUMIN in the last 168 hours.  Recent Labs Lab 04/15/17 0438 04/16/17 0557 04/20/17 0506  WBC 2.6* 3.2* 2.5*  NEUTROABS 1.6*  --   --   HGB 11.5* 10.6* 9.0*  HCT 30.3* 27.0* 24.0*  MCV 92.1 88.8 92.7  PLT 267 240 208   Iron/TIBC/Ferritin/ %Sat No results found for: IRON, TIBC, FERRITIN, IRONPCTSAT

## 2017-04-21 LAB — BASIC METABOLIC PANEL
Anion gap: 7 (ref 5–15)
BUN: 16 mg/dL (ref 6–20)
CALCIUM: 8.6 mg/dL — AB (ref 8.9–10.3)
CHLORIDE: 89 mmol/L — AB (ref 101–111)
CO2: 27 mmol/L (ref 22–32)
Creatinine, Ser: 0.65 mg/dL (ref 0.44–1.00)
GFR calc non Af Amer: >60 mL/min (ref >60)
Glucose, Bld: 174 mg/dL — ABNORMAL HIGH (ref 65–99)
Potassium: 5.3 mmol/L — ABNORMAL HIGH (ref 3.5–5.1)
SODIUM: 123 mmol/L — AB (ref 135–145)

## 2017-04-21 LAB — SODIUM: Sodium: 126 mmol/L — ABNORMAL LOW (ref 135–145)

## 2017-04-21 MED ORDER — AMLODIPINE BESYLATE 10 MG PO TABS
10.0000 mg | ORAL_TABLET | Freq: Every day | ORAL | Status: DC
  Administered 2017-04-22 – 2017-04-23 (×2): 10 mg via ORAL
  Filled 2017-04-21 (×2): qty 1

## 2017-04-21 MED ORDER — SODIUM CHLORIDE 1 G PO TABS
3.0000 g | ORAL_TABLET | Freq: Three times a day (TID) | ORAL | Status: DC
  Administered 2017-04-22 (×2): 3 g via ORAL
  Filled 2017-04-21 (×2): qty 3

## 2017-04-21 MED ORDER — FUROSEMIDE 20 MG PO TABS
20.0000 mg | ORAL_TABLET | Freq: Every day | ORAL | Status: DC
  Administered 2017-04-21 – 2017-04-22 (×2): 20 mg via ORAL
  Filled 2017-04-21 (×2): qty 1

## 2017-04-21 MED ORDER — SODIUM CHLORIDE 1 G PO TABS
2.0000 g | ORAL_TABLET | Freq: Three times a day (TID) | ORAL | Status: DC
  Administered 2017-04-21: 2 g via ORAL
  Filled 2017-04-21: qty 2

## 2017-04-21 NOTE — Progress Notes (Signed)
  Jessica Zavala Progress Note   Subjective: Na 125, have dc'd tolvaptan, didn't get any today   Vitals:   04/20/17 0731 04/20/17 1100 04/20/17 2139 04/21/17 0659  BP: (!) 164/56 122/60 (!) 155/53 (!) 155/61  Pulse: 72  80 73  Resp:   18 18  Temp:   98.5 F (36.9 C) 98 F (36.7 C)  TempSrc:   Oral Oral  SpO2:   99% 99%  Weight:      Height:        Inpatient medications: . [START ON 04/22/2017] amLODipine  10 mg Oral Daily  . clobetasol cream  1 application Topical BID  . dextromethorphan-guaiFENesin  2 tablet Oral Q12H  . furosemide  20 mg Oral Daily  . labetalol  200 mg Oral BID  . letrozole  2.5 mg Oral Daily  . levofloxacin  500 mg Oral QPM  . pantoprazole  40 mg Oral BID AC  . [START ON 04/22/2017] sodium chloride  3 g Oral TID WC  . triamcinolone  1 application Mouth/Throat BID  . triamcinolone cream   Topical BID    acetaminophen, benzonatate, ondansetron (ZOFRAN) IV  Exam: Alert, no distress No jvd Chest CTAB RRR  ABD soft ntnd Ext no edema  Urine Na 86  Uosm 568   Assessment: 1  Acute on chronic hyponatremia - suspected SIADH related to malignancy. Improved with hypertonic saline, then relapsed and now better after tolvaptan x 1.  DC'd tolvaptan, starting salt tabs/ po lasix/ fluid restriction. She needs instruction in fluid restriction.    2  Metastatic breast Ca  Plan - as above   Kelly Splinter MD  [place] pager [phone]   04/21/2017, 5:20 PM    Recent Labs Lab 04/19/17 0306  04/20/17 0506  04/20/17 2006 04/21/17 0350 04/21/17 1218  NA 123*  123*  < > 117*  < > 125* 126* 123*  K 3.2*  --  3.2*  --   --   --  5.3*  CL 93*  --  83*  --   --   --  89*  CO2 24  --  24  --   --   --  27  GLUCOSE 106*  --  88  --   --   --  174*  BUN <5*  --  7  --   --   --  16  CREATININE 0.34*  --  0.37*  --   --   --  0.65  CALCIUM 6.3*  --  6.9*  --   --   --  8.6*  < > = values in this interval not displayed. No  results for input(s): AST, ALT, ALKPHOS, BILITOT, PROT, ALBUMIN in the last 168 hours.  Recent Labs Lab 04/15/17 0438 04/16/17 0557 04/20/17 0506  WBC 2.6* 3.2* 2.5*  NEUTROABS 1.6*  --   --   HGB 11.5* 10.6* 9.0*  HCT 30.3* 27.0* 24.0*  MCV 92.1 88.8 92.7  PLT 267 240 208   Iron/TIBC/Ferritin/ %Sat No results found for: IRON, TIBC, FERRITIN, IRONPCTSAT

## 2017-04-21 NOTE — Progress Notes (Signed)
Physical Therapy Treatment Patient Details Name: Jessica Zavala MRN: [id number] DOB: 1945-12-09 Today's Date: 04/21/2017    History of Present Illness  Jessica Zavala is a 71 y.o. female with known breast cancer on oral tx, presented to [facility] 04/15/17 with one week in duration progressively worsening productive cough , LLLpna, hyponatremia.    PT Comments    Pt feeling better.  Spouse reports pt is amb self around room and to and from bathroom.  Assisted with amb full unit.  Good alternating gait.  No need for any AD.  No LOB.  Pt progressing well and plans to D/C to home with spouse.   Follow Up Recommendations  Home health PT;Supervision/Assistance - 24 hour     Equipment Recommendations  3in1 (PT);Rolling walker with 5" wheels  May not need/establish closer to D/C time  Recommendations for Other Services       Precautions / Restrictions Precautions Precautions: Fall Restrictions Weight Bearing Restrictions: No    Mobility  Bed Mobility Overal bed mobility: Modified Independent                Transfers Overall transfer level: Modified independent                  Ambulation/Gait Ambulation/Gait assistance: Supervision   Assistive device: None Gait Pattern/deviations: Step-through pattern Gait velocity: WFL   General Gait Details: tolerated increased distance and good alternating gait.  HR 67 and RA 96%   Stairs            Wheelchair Mobility    Modified Rankin (Stroke Patients Only)       Balance                                            Cognition Arousal/Alertness: Awake/alert Behavior During Therapy: WFL for tasks assessed/performed Overall Cognitive Status: Within Functional Limits for tasks assessed                                        Exercises      General Comments        Pertinent Vitals/Pain Pain Assessment: No/denies pain    Home Living                      Prior Function             PT Goals (current goals can now be found in the care plan section)      Frequency    Min 3X/week      PT Plan Current plan remains appropriate    Co-evaluation              AM-PAC PT "6 Clicks" Daily Activity  Outcome Measure  Difficulty turning over in bed (including adjusting bedclothes, sheets and blankets)?: A Little Difficulty moving from lying on back to sitting on the side of the bed? : A Little Difficulty sitting down on and standing up from a chair with arms (e.g., wheelchair, bedside commode, etc,.)?: A Little Help needed moving to and from a bed to chair (including a wheelchair)?: A Little Help needed walking in hospital room?: A Lot Help needed climbing 3-5 steps with a railing? : A Lot 6 Click Score: 16    End of Session Equipment Utilized  During Treatment: Gait belt Activity Tolerance: Patient tolerated treatment well Patient left: in chair;with call bell/phone within reach;with family/visitor present   PT Visit Diagnosis: Unsteadiness on feet (R26.81)     Time: 9924-2683 PT Time Calculation (min) (ACUTE ONLY): 13 min  Charges:  $Gait Training: 8-22 mins                    G Codes:       Rica Koyanagi  PTA [facility]  Acute  Rehab Pager      [phone]

## 2017-04-21 NOTE — Progress Notes (Signed)
PROGRESS NOTE [facility]   Jessica Zavala   XBJ:[id number] DOB: 08/06/1946  DOA: 04/15/2017 PCP: [name], MD   Brief Narrative:  71 year old female is hypertension on HCTZ with metastatic breast cancer admitted on 04/16/2017 with one week history of cough and sputum and X-ray concerning of pneumonia patient was admitted for antibiotic treatment subsequent sodium was found to be 103 with symptoms of lethargy 7 confusion patient was transferred to stepdown. Patient being treated with hypertonic saline mentally improved.   Subjective: Continues to do well, c/o lower tongue mouth pain. No acute events overnight   Assessment & Plan: Acute on chronic Symptomatic Hyponatremia - presumed SIADH  Initially treated with 3% saline, then given one dose of Tolvaptan   Now Na+ improving currently 126  Will add Lasix 20 mg daily  Continue fluid restriction  Will add Salt tablets  Monitor Na+ in AM  Renal recommendations appreciated  OOB as tolerated   Metabolic encephalopathy - resolved, due to hypoNa+  HTN - Slight above goal  Continue Labetalol 200 mg BID  Increase Norvasc to 10 mg daily  Monitor BP   CAP  Initially treated with Vanc (3 days) and cefepime (4 days) Now on Levaquin will complete a 5 day course  Repeat CXR in 3-4 weeks to assure resolution of xray findings   Metastatic breast cancer   Per oncology   DVT prophylaxis:  Code Status: Full  Family Communication: Husband at bedside  Disposition Plan: Likely to go home in 1-2 days, can move to tele   Consultants:   Nephrology   Oncology   Procedures:   None   Antimicrobials: Anti-infectives    Start     Dose/Rate Route Frequency Ordered Stop   04/18/17 1800  levofloxacin (LEVAQUIN) tablet 500 mg     500 mg Oral Every evening 04/18/17 1240 04/23/17 1759   04/15/17 2100  vancomycin (VANCOCIN) 500 mg in sodium chloride 0.9 % 100 mL IVPB  Status:  Discontinued     500 mg 100 mL/hr over 60 Minutes  Intravenous Every 12 hours 04/15/17 1100 04/17/17 1329   04/15/17 1600  ceFEPIme (MAXIPIME) 1 g in dextrose 5 % 50 mL IVPB  Status:  Discontinued     1 g 100 mL/hr over 30 Minutes Intravenous Every 8 hours 04/15/17 1038 04/18/17 1240   04/15/17 0715  vancomycin (VANCOCIN) IVPB 1000 mg/200 mL premix     1,000 mg 200 mL/hr over 60 Minutes Intravenous  Once 04/15/17 0713 04/15/17 1036   04/15/17 0715  ceFEPIme (MAXIPIME) 1 g in dextrose 5 % 50 mL IVPB     1 g 100 mL/hr over 30 Minutes Intravenous  Once 04/15/17 0713 04/15/17 0917       Objective: Vitals:   04/20/17 0731 04/20/17 1100 04/20/17 2139 04/21/17 0659  BP: (!) 164/56 122/60 (!) 155/53 (!) 155/61  Pulse: 72  80 73  Resp:   18 18  Temp:   98.5 F (36.9 C) 98 F (36.7 C)  TempSrc:   Oral Oral  SpO2:   99% 99%  Weight:      Height:       No intake or output data in the 24 hours ending 04/21/17 1606 Filed Weights   04/15/17 0408 04/15/17 1036  Weight: 54.4 kg (120 lb) 55.9 kg (123 lb 3.8 oz)    Examination: - No changes in physical exam from 04/20/17  General exam: NAD   HEENT: No signs of infection, dentition normal, no LAD, trachea  midline and non tender, thyroid nml  Respiratory system: CTA bilateral Cardiovascular system: S1-S2, RRR, no murmurs or gallops Gastrointestinal system: Abdomen soft nontender nondistended Central nervous system: AAOx3 Extremities: No LE edema  Skin: No lesions noted   Data Reviewed: I have personally reviewed following labs and imaging studies  CBC:  Recent Labs Lab 04/15/17 0438 04/16/17 0557 04/20/17 0506  WBC 2.6* 3.2* 2.5*  NEUTROABS 1.6*  --   --   HGB 11.5* 10.6* 9.0*  HCT 30.3* 27.0* 24.0*  MCV 92.1 88.8 92.7  PLT 267 240 628   Basic Metabolic Panel:  Recent Labs Lab 04/17/17 1201  04/18/17 1338  04/19/17 0306  04/20/17 0506 04/20/17 1000 04/20/17 2006 04/21/17 0350 04/21/17 1218  NA 108*  < > 116*  < > 123*  123*  < > 117* 116* 125* 126* 123*  K 3.1*   --  3.4*  --  3.2*  --  3.2*  --   --   --  5.3*  CL 77*  --  87*  --  93*  --  83*  --   --   --  89*  CO2 25  --  24  --  24  --  24  --   --   --  27  GLUCOSE 110*  --  108*  --  106*  --  88  --   --   --  174*  BUN 8  --  6  --  <5*  --  7  --   --   --  16  CREATININE 0.36*  --  0.34*  --  0.34*  --  0.37*  --   --   --  0.65  CALCIUM 6.8*  --  6.7*  --  6.3*  --  6.9*  --   --   --  8.6*  MG 1.3*  --   --   --   --   --   --   --   --   --   --   < > = values in this interval not displayed. GFR: Estimated Creatinine Clearance: 49.9 mL/min (by C-G formula based on SCr of 0.65 mg/dL).  Cardiac Enzymes:  Recent Labs Lab 04/15/17 1110 04/15/17 1651 04/15/17 2232  TROPONINI <0.03 <0.03 <0.03   Thyroid Function Tests: No results for input(s): TSH, T4TOTAL, FREET4, T3FREE, THYROIDAB in the last 72 hours. Anemia Panel: No results for input(s): VITAMINB12, FOLATE, FERRITIN, TIBC, IRON, RETICCTPCT in the last 72 hours. Sepsis Labs: No results for input(s): PROCALCITON, LATICACIDVEN in the last 168 hours.  Recent Results (from the past 240 hour(s))  Blood culture (routine x 2)     Status: None   Collection Time: 04/15/17  8:13 AM  Result Value Ref Range Status   Specimen Description BLOOD LEFT ANTECUBITAL  Final   Special Requests   Final    BOTTLES DRAWN AEROBIC AND ANAEROBIC Blood Culture adequate volume   Culture   Final    NO GROWTH 5 DAYS Performed at [facility], [address] [place], [place] [address]    Report Status 04/20/2017 FINAL  Final  Blood culture (routine x 2)     Status: None   Collection Time: 04/15/17  8:14 AM  Result Value Ref Range Status   Specimen Description BLOOD LEFT HAND  Final   Special Requests   Final    BOTTLES DRAWN AEROBIC AND ANAEROBIC Blood Culture adequate volume  Culture   Final    NO GROWTH 5 DAYS Performed at [facility], [place] [address], [place], [place] [address]    Report Status 04/20/2017 FINAL  Final  MRSA  PCR Screening     Status: None   Collection Time: 04/17/17  9:30 AM  Result Value Ref Range Status   MRSA by PCR NEGATIVE NEGATIVE Final    Comment:        The GeneXpert MRSA Assay (FDA approved for NASAL specimens only), is one component of a comprehensive MRSA colonization surveillance program. It is not intended to diagnose MRSA infection nor to guide or monitor treatment for MRSA infections.       Radiology Studies: No results found.   Scheduled Meds: . amLODipine  5 mg Oral Daily  . clobetasol cream  1 application Topical BID  . dextromethorphan-guaiFENesin  2 tablet Oral Q12H  . furosemide  20 mg Oral Daily  . labetalol  200 mg Oral BID  . letrozole  2.5 mg Oral Daily  . levofloxacin  500 mg Oral QPM  . pantoprazole  40 mg Oral BID AC  . sodium chloride  2 g Oral TID WC  . triamcinolone  1 application Mouth/Throat BID  . triamcinolone cream   Topical BID   Continuous Infusions:    LOS: 6 days    Chipper Oman, MD Pager: Text Page via [email or web address]  [phone]  If 7PM-7AM, please contact night-coverage [email or web address] Password [email or web address] 04/21/2017, 4:06 PM

## 2017-04-22 LAB — BASIC METABOLIC PANEL
Anion gap: 8 (ref 5–15)
BUN: 16 mg/dL (ref 6–20)
CHLORIDE: 88 mmol/L — AB (ref 101–111)
CO2: 28 mmol/L (ref 22–32)
CREATININE: 0.54 mg/dL (ref 0.44–1.00)
Calcium: 8.1 mg/dL — ABNORMAL LOW (ref 8.9–10.3)
Glucose, Bld: 92 mg/dL (ref 65–99)
POTASSIUM: 4.1 mmol/L (ref 3.5–5.1)
SODIUM: 124 mmol/L — AB (ref 135–145)

## 2017-04-22 LAB — SODIUM: SODIUM: 126 mmol/L — AB (ref 135–145)

## 2017-04-22 MED ORDER — TOLVAPTAN 15 MG PO TABS
15.0000 mg | ORAL_TABLET | ORAL | Status: AC
  Administered 2017-04-22: 15 mg via ORAL
  Filled 2017-04-22: qty 1

## 2017-04-22 NOTE — Progress Notes (Addendum)
  [facility] Progress Note   Subjective: Na 124 today   Vitals:   04/20/17 2139 04/21/17 0659 04/21/17 2139 04/22/17 0534  BP: (!) 155/53 (!) 155/61 (!) 156/59 (!) 168/62  Pulse: 80 73 78 76  Resp: 18 18 18 17   Temp: 98.5 F (36.9 C) 98 F (36.7 C) 97.9 F (36.6 C) 98 F (36.7 C)  TempSrc: Oral Oral Oral Oral  SpO2: 99% 99% 97% 100%  Weight:      Height:        Inpatient medications: . amLODipine  10 mg Oral Daily  . clobetasol cream  1 application Topical BID  . dextromethorphan-guaiFENesin  2 tablet Oral Q12H  . furosemide  20 mg Oral Daily  . labetalol  200 mg Oral BID  . letrozole  2.5 mg Oral Daily  . levofloxacin  500 mg Oral QPM  . pantoprazole  40 mg Oral BID AC  . tolvaptan  15 mg Oral Q24H  . triamcinolone  1 application Mouth/Throat BID  . triamcinolone cream   Topical BID    acetaminophen, benzonatate, ondansetron (ZOFRAN) IV  Exam: Alert, no distress No jvd Chest CTAB RRR  ABD soft ntnd Ext no edema  Urine Na 86  Uosm 568   Assessment: 1  Acute on chronic hyponatremia - suspected SIADH related to malignancy. Uosm 500's now in 300's.  Improved with hypertonic saline, then relapsed and started tolvaptan. Na 124 today, will give one more dose tolvaptan today. Holding salt tabs/ fluid restrict while on tolvaptan. If Na is better tomorrow, anticipate possible dc w/ continue baseline Rx's (lasix / salt/ fluid restrict) at home.   2  Metastatic breast Ca  Plan - as above   Kelly Splinter MD [facility] pager [phone]   04/22/2017, 1:45 PM    Recent Labs Lab 04/20/17 0506  04/21/17 0350 04/21/17 1218 04/22/17 0530  NA 117*  < > 126* 123* 124*  K 3.2*  --   --  5.3* 4.1  CL 83*  --   --  89* 88*  CO2 24  --   --  27 28  GLUCOSE 88  --   --  174* 92  BUN 7  --   --  16 16  CREATININE 0.37*  --   --  0.65 0.54  CALCIUM 6.9*  --   --  8.6* 8.1*  < > = values in this interval not displayed. No results for  input(s): AST, ALT, ALKPHOS, BILITOT, PROT, ALBUMIN in the last 168 hours.  Recent Labs Lab 04/16/17 0557 04/20/17 0506  WBC 3.2* 2.5*  HGB 10.6* 9.0*  HCT 27.0* 24.0*  MCV 88.8 92.7  PLT 240 208   Iron/TIBC/Ferritin/ %Sat No results found for: IRON, TIBC, FERRITIN, IRONPCTSAT

## 2017-04-22 NOTE — Progress Notes (Addendum)
Called interpreting services to go over new patient medication, Samsca, with patient and husband.  Medication information printed out, and all questions answered.  Pt and family agreeable to taking medication.

## 2017-04-22 NOTE — Progress Notes (Addendum)
PROGRESS NOTE [facility]   Jessica Zavala   GBE:[id number] DOB: 08/31/1946  DOA: 04/15/2017 PCP: [name], MD   Brief Narrative:  72 year old female is hypertension on HCTZ with metastatic breast cancer admitted on 04/16/2017 with one week history of cough and sputum and X-ray concerning of pneumonia patient was admitted for antibiotic treatment subsequent sodium was found to be 103 with symptoms of lethargy 7 confusion patient was transferred to stepdown. Patient was treated with hypertonic saline mentally back to baseline, although sodium continue to be low. Receiving tolvaptan.   Subjective: No complaints Na+ 124 today   Assessment & Plan: Acute on chronic Symptomatic Hyponatremia - presumed SIADH  Initially treated with 3% saline, Got 1 dose of Talvalptan, will give another dose today  Lasix 20 mg daily  Hold fluid restriction and Salt tablets  Monitor Na+ in AM  Renal recommendations appreciated  OOB as tolerated   Metabolic encephalopathy - resolved, due to hypoNa+  HTN - Slight above goal  Continue Labetalol 200 mg BID  Increase Norvasc to 10 mg daily  Monitor BP   CAP  Initially treated with Vanc (3 days) and cefepime (4 days) On Levaquin will complete course on 04/23/18 Repeat CXR in 3-4 weeks to assure resolution of xray findings   Metastatic breast cancer   Per oncology   DVT prophylaxis: SCD  Code Status: Full  Family Communication: Husband at bedside  Disposition Plan: Hopefully in the next 24 hrs   Consultants:   Nephrology   Oncology   Procedures:   None   Antimicrobials: Anti-infectives    Start     Dose/Rate Route Frequency Ordered Stop   04/18/17 1800  levofloxacin (LEVAQUIN) tablet 500 mg     500 mg Oral Every evening 04/18/17 1240 04/23/17 1759   04/15/17 2100  vancomycin (VANCOCIN) 500 mg in sodium chloride 0.9 % 100 mL IVPB  Status:  Discontinued     500 mg 100 mL/hr over 60 Minutes Intravenous Every 12 hours 04/15/17 1100  04/17/17 1329   04/15/17 1600  ceFEPIme (MAXIPIME) 1 g in dextrose 5 % 50 mL IVPB  Status:  Discontinued     1 g 100 mL/hr over 30 Minutes Intravenous Every 8 hours 04/15/17 1038 04/18/17 1240   04/15/17 0715  vancomycin (VANCOCIN) IVPB 1000 mg/200 mL premix     1,000 mg 200 mL/hr over 60 Minutes Intravenous  Once 04/15/17 0713 04/15/17 1036   04/15/17 0715  ceFEPIme (MAXIPIME) 1 g in dextrose 5 % 50 mL IVPB     1 g 100 mL/hr over 30 Minutes Intravenous  Once 04/15/17 0713 04/15/17 0917       Objective: Vitals:   04/21/17 0659 04/21/17 2139 04/22/17 0534 04/22/17 1406  BP: (!) 155/61 (!) 156/59 (!) 168/62 (!) 121/56  Pulse: 73 78 76 70  Resp: 18 18 17 16   Temp: 98 F (36.7 C) 97.9 F (36.6 C) 98 F (36.7 C) 97.8 F (36.6 C)  TempSrc: Oral Oral Oral Oral  SpO2: 99% 97% 100% 97%  Weight:      Height:        Intake/Output Summary (Last 24 hours) at 04/22/17 1636 Last data filed at 04/22/17 1300  Gross per 24 hour  Intake              450 ml  Output                0 ml  Net  450 ml   Filed Weights   04/15/17 0408 04/15/17 1036  Weight: 54.4 kg (120 lb) 55.9 kg (123 lb 3.8 oz)    Examination:   General exam: NAD  Respiratory system: Clear  Cardiovascular system: S1S2 no murmurs  Gastrointestinal system: Abd NTND Central nervous system: AAOx3  Extremities: No LE edema   Data Reviewed: I have personally reviewed following labs and imaging studies  CBC:  Recent Labs Lab 04/16/17 0557 04/20/17 0506  WBC 3.2* 2.5*  HGB 10.6* 9.0*  HCT 27.0* 24.0*  MCV 88.8 92.7  PLT 240 102   Basic Metabolic Panel:  Recent Labs Lab 04/17/17 1201  04/18/17 1338  04/19/17 0306  04/20/17 0506 04/20/17 1000 04/20/17 2006 04/21/17 0350 04/21/17 1218 04/22/17 0530  NA 108*  < > 116*  < > 123*  123*  < > 117* 116* 125* 126* 123* 124*  K 3.1*  --  3.4*  --  3.2*  --  3.2*  --   --   --  5.3* 4.1  CL 77*  --  87*  --  93*  --  83*  --   --   --  89* 88*  CO2  25  --  24  --  24  --  24  --   --   --  27 28  GLUCOSE 110*  --  108*  --  106*  --  88  --   --   --  174* 92  BUN 8  --  6  --  <5*  --  7  --   --   --  16 16  CREATININE 0.36*  --  0.34*  --  0.34*  --  0.37*  --   --   --  0.65 0.54  CALCIUM 6.8*  --  6.7*  --  6.3*  --  6.9*  --   --   --  8.6* 8.1*  MG 1.3*  --   --   --   --   --   --   --   --   --   --   --   < > = values in this interval not displayed. GFR: Estimated Creatinine Clearance: 49.9 mL/min (by C-G formula based on SCr of 0.54 mg/dL).  Cardiac Enzymes:  Recent Labs Lab 04/15/17 1651 04/15/17 2232  TROPONINI <0.03 <0.03   Thyroid Function Tests: No results for input(s): TSH, T4TOTAL, FREET4, T3FREE, THYROIDAB in the last 72 hours. Anemia Panel: No results for input(s): VITAMINB12, FOLATE, FERRITIN, TIBC, IRON, RETICCTPCT in the last 72 hours. Sepsis Labs: No results for input(s): PROCALCITON, LATICACIDVEN in the last 168 hours.  Recent Results (from the past 240 hour(s))  Blood culture (routine x 2)     Status: None   Collection Time: 04/15/17  8:13 AM  Result Value Ref Range Status   Specimen Description BLOOD LEFT ANTECUBITAL  Final   Special Requests   Final    BOTTLES DRAWN AEROBIC AND ANAEROBIC Blood Culture adequate volume   Culture   Final    NO GROWTH 5 DAYS Performed at [facility], [address] [place],  [address]    Report Status 04/20/2017 FINAL  Final  Blood culture (routine x 2)     Status: None   Collection Time: 04/15/17  8:14 AM  Result Value Ref Range Status   Specimen Description BLOOD LEFT HAND  Final   Special Requests   Final  BOTTLES DRAWN AEROBIC AND ANAEROBIC Blood Culture adequate volume   Culture   Final    NO GROWTH 5 DAYS Performed at [facility], [place] [address] [place], [place] [address]    Report Status 04/20/2017 FINAL  Final  MRSA PCR Screening     Status: None   Collection Time: 04/17/17  9:30 AM  Result Value Ref Range Status   MRSA  by PCR NEGATIVE NEGATIVE Final    Comment:        The GeneXpert MRSA Assay (FDA approved for NASAL specimens only), is one component of a comprehensive MRSA colonization surveillance program. It is not intended to diagnose MRSA infection nor to guide or monitor treatment for MRSA infections.       Radiology Studies: No results found.   Scheduled Meds: . amLODipine  10 mg Oral Daily  . clobetasol cream  1 application Topical BID  . dextromethorphan-guaiFENesin  2 tablet Oral Q12H  . furosemide  20 mg Oral Daily  . labetalol  200 mg Oral BID  . letrozole  2.5 mg Oral Daily  . levofloxacin  500 mg Oral QPM  . pantoprazole  40 mg Oral BID AC  . triamcinolone  1 application Mouth/Throat BID  . triamcinolone cream   Topical BID   Continuous Infusions:    LOS: 7 days    Chipper Oman, MD Pager: Text Page via [email or web address]  [phone]  If 7PM-7AM, please contact night-coverage [email or web address] Password [facility] 04/22/2017, 4:36 PM

## 2017-04-23 DIAGNOSIS — I10 Essential (primary) hypertension: Secondary | ICD-10-CM

## 2017-04-23 LAB — BASIC METABOLIC PANEL
Anion gap: 10 (ref 5–15)
BUN: 19 mg/dL (ref 6–20)
CALCIUM: 8.8 mg/dL — AB (ref 8.9–10.3)
CO2: 26 mmol/L (ref 22–32)
CREATININE: 0.59 mg/dL (ref 0.44–1.00)
Chloride: 91 mmol/L — ABNORMAL LOW (ref 101–111)
Glucose, Bld: 110 mg/dL — ABNORMAL HIGH (ref 65–99)
Potassium: 3.8 mmol/L (ref 3.5–5.1)
SODIUM: 127 mmol/L — AB (ref 135–145)

## 2017-04-23 MED ORDER — AMLODIPINE BESYLATE 10 MG PO TABS: 10.0000 mg | ORAL_TABLET | Freq: Every day | ORAL | 0 refills | Status: DC

## 2017-04-23 MED ORDER — ATORVASTATIN CALCIUM 20 MG PO TABS: 20.0000 mg | ORAL_TABLET | Freq: Every day | ORAL | 11 refills | Status: AC

## 2017-04-23 MED ORDER — SODIUM CHLORIDE 1 G PO TABS
3.0000 g | ORAL_TABLET | Freq: Two times a day (BID) | ORAL | Status: DC
  Administered 2017-04-23: 3 g via ORAL
  Filled 2017-04-23: qty 3

## 2017-04-23 MED ORDER — FUROSEMIDE 20 MG PO TABS: 20.0000 mg | ORAL_TABLET | Freq: Every day | ORAL | 0 refills | Status: AC

## 2017-04-23 MED ORDER — SODIUM CHLORIDE 1 G PO TABS: 1.0000 g | ORAL_TABLET | Freq: Two times a day (BID) | ORAL | 0 refills | Status: AC

## 2017-04-23 MED ORDER — FUROSEMIDE 20 MG PO TABS
20.0000 mg | ORAL_TABLET | Freq: Every day | ORAL | Status: DC
  Administered 2017-04-23: 20 mg via ORAL
  Filled 2017-04-23: qty 1

## 2017-04-23 NOTE — Care Management Note (Signed)
Case Management Note  Patient Details  Name: Jessica Zavala MRN: [id number] Date of Birth: 1946-05-02  Subjective/Objective:    HTN, SIADH,  CAP              Action/Plan: Discharge Planning: Please see previous NCM notes. Son requested dc summary be sent to PCP. Faxed to office. RW is in the pt's room to take home. Contacted [facility] for dc home today with HH. Husband and adult son, [name] at home to assist with care.    PCP [name] MD   Expected Discharge Date:  04/23/17               Expected Discharge Plan:  [place]  In-House Referral:  NA  Discharge planning Services  CM Consult  Post Acute Care Choice:  Home Health Choice offered to:  Adult Children  DME Arranged:  Walker rolling DME Agency:  [place] Arranged:  PT [place] Agency:  [place]  Status of Service:  Completed, signed off  If discussed at [place] of Stay Meetings, dates discussed:    Additional Comments:  Erenest Rasher, RN 04/23/2017, 12:06 PM

## 2017-04-23 NOTE — Discharge Summary (Signed)
Physician Discharge Summary  Jessica Zavala  CZY:[id number]  DOB: 1946-10-24  DOA: 04/15/2017 PCP: [name], MD  Admit date: 04/15/2017 Discharge date: 04/23/2017  Admitted From: Home  Disposition:  Home  Recommendations for Outpatient Follow-up:  1. Follow up with PCP in 1 week 2. Please obtain BMP/CBC in one week to monitor Hgb and Na+  Home Health: HHPT  Equipment/Devices: 3:1 Rolling walker    Discharge Condition: Stable  CODE STATUS: FULL Diet recommendation: Regular   Brief/Interim Summary: 71 year old female is hypertension on HCTZ with metastatic breast cancer admitted on 04/16/2017 with one week history of cough and sputum and X-ray concerning of pneumonia patient was admitted for antibiotic treatment. Subsequently sodium was found to be 103 with symptoms of lethargy and confusion patient was transferred to stepdown. Patient was treated with hypertonic saline, then her mentation came back to baseline, although sodium continued to be low. Patient received 2 doses of Tolvaptan, was treated with salt tabs, Lasix and fluid restrictions. Sodium when up to 127 upon discharge. Patient clinically stable and will be discharge home with [facility] PT and to follow up with PCP in 1 week.   Subjective: Patient seen and examined no new complaints. Denies CP, SOB, dizziness and palpitations   Discharge Diagnoses/Hospital Course:  Acute on chronic Symptomatic Hyponatremia - presumed SIADH  Initially treated with 3% saline, Got 2 doses of Talvalptan during the hospital stay  Treated with Lasix 20 mg daily and fluid restriction which will continue at home  Will send Salt tablets for 3 days  Monitor Na+ in 1 week  Metabolic encephalopathy - resolved, due to hypoNa+  HTN - Stable upon discharge  During hospital stay BP was above goal, given HCTZ and lisinopril were discontinued for possible causes of hyponatremia. Patient was started on Norvasc with good control of BP   Continue Labetalol 200 mg  BID  Given patient started on Norvasc Zocor was switched to Lipitor 20 mg  Monitor BP occational with PCP   CAP  Initially treated with Vanc (3 days) and cefepime (4 days) Then Levaquin which she completed a 5 day course Repeat CXR in 3-4 weeks to assure resolution of xray findings   Metastatic breast cancer   Per oncology - Holding Ibrance until seen by oncology   All other chronic medical condition were stable during the hospitalization.  Patient was seen by physical therapy, recommending HH PT  On the day of the discharge the patient's vitals were stable, and no other acute medical condition were reported by patient. Patient was felt safe to be discharge to home.   Discharge Instructions  You were cared for by a hospitalist during your hospital stay. If you have any questions about your discharge medications or the care you received while you were in the hospital after you are discharged, you can call the unit and asked to speak with the hospitalist on call if the hospitalist that took care of you is not available. Once you are discharged, your primary care physician will handle any further medical issues. Please note that NO REFILLS for any discharge medications will be authorized once you are discharged, as it is imperative that you return to your primary care physician (or establish a relationship with a primary care physician if you do not have one) for your aftercare needs so that they can reassess your need for medications and monitor your lab values.  Discharge Instructions    Call MD for:  difficulty breathing, headache or visual disturbances  Complete by:  As directed    Call MD for:  extreme fatigue    Complete by:  As directed    Call MD for:  hives    Complete by:  As directed    Call MD for:  persistant dizziness or light-headedness    Complete by:  As directed    Call MD for:  persistant nausea and vomiting    Complete by:  As directed    Call MD for:  redness,  tenderness, or signs of infection (pain, swelling, redness, odor or green/yellow discharge around incision site)    Complete by:  As directed    Call MD for:  severe uncontrolled pain    Complete by:  As directed    Call MD for:  temperature >100.4    Complete by:  As directed    Diet general    Complete by:  As directed    Discharge instructions    Complete by:  As directed    Fluid restriction to no more than 1.5L a day until Na+ close to normal levels Follow up PCP to obtain sodium levels in 1 week.  Salt tablet for 3 days  Lasix until seen by PCP   Increase activity slowly    Complete by:  As directed      Allergies as of 04/23/2017      Reactions   Aspirin Swelling   Mouth and facial swelling      Medication List    STOP taking these medications   IBRANCE 75 MG capsule Generic drug:  palbociclib   losartan-hydrochlorothiazide 100-25 MG tablet Commonly known as:  HYZAAR   Menthol (Topical Analgesic) 4 % Gel Commonly known as:  BIOFREEZE COLORLESS   palbociclib 75 MG capsule Commonly known as:  IBRANCE   simvastatin 40 MG tablet Commonly known as:  ZOCOR   triamcinolone 0.1 % paste Commonly known as:  KENALOG   triamcinolone cream 0.1 % Commonly known as:  KENALOG     TAKE these medications   acetaminophen 325 MG tablet Commonly known as:  TYLENOL Take 650 mg by mouth every 6 (six) hours as needed for mild pain.   amLODipine 10 MG tablet Commonly known as:  NORVASC Take 1 tablet (10 mg total) by mouth daily. Start taking on:  04/24/2017   atorvastatin 20 MG tablet Commonly known as:  LIPITOR Take 1 tablet (20 mg total) by mouth daily.   benzonatate 100 MG capsule Commonly known as:  TESSALON Take 1-2 capsules (100-200 mg total) by mouth 3 (three) times daily as needed for cough.   clobetasol cream 0.05 % Commonly known as:  TEMOVATE Apply 1 application topically 2 (two) times daily as needed.   furosemide 20 MG tablet Commonly known as:   LASIX Take 1 tablet (20 mg total) by mouth daily. Start taking on:  04/24/2017   labetalol 200 MG tablet Commonly known as:  NORMODYNE Take 1 tablet (200 mg total) by mouth 2 (two) times daily.   letrozole 2.5 MG tablet Commonly known as:  FEMARA Take 1 tablet (2.5 mg total) by mouth daily.   MUCINEX DM MAXIMUM STRENGTH 60-1200 MG Tb12 Take 1 tablet by mouth every 12 (twelve) hours.   sodium chloride 1 g tablet Take 1 tablet (1 g total) by mouth 2 (two) times daily with a meal.            Durable Medical Equipment        Start  Ordered   04/23/17 1140  DME 3-in-1  Once     04/23/17 1140   04/20/17 1349  For home use only DME Walker rolling  Once    Question:  Patient needs a walker to treat with the following condition  Answer:  Weakness   04/20/17 1349     Follow-up Information    [facility] Follow up.   Contact information: [place] [id number] [phone]          Allergies  Allergen Reactions  . Aspirin Swelling    Mouth and facial swelling    Consultations:  Nephrology    Procedures/Studies: Dg Chest 2 View  Result Date: 04/15/2017 CLINICAL DATA:  Chest pain and shortness of breath starting Wednesday. Patient was seen and treated for upper respiratory infection at urgent care center. Hypertension. Nonsmoker. EXAM: CHEST  2 VIEW COMPARISON:  CT chest 11/03/2016.  Chest 12/10/2014 FINDINGS: There is suggestion of focal opacification of the left lung base behind the heart which may indicate focal pneumonia. Right lung is clear and expanded. Normal heart size and pulmonary vascularity. Calcified and tortuous aorta. No pleural effusions. No pneumothorax. Surgical clips in the right axilla. IMPRESSION: Infiltration suggested behind the heart in the left lung base, possibly pneumonia. Electronically Signed   By: Lucienne Capers M.D.   On: 04/15/2017 04:48    Discharge Exam: Vitals:   04/22/17 2012 04/23/17  0535  BP: (!) 123/54 (!) 140/58  Pulse: 79 73  Resp: 16 16  Temp: 97.9 F (36.6 C) 98.1 F (36.7 C)   Vitals:   04/22/17 0534 04/22/17 1406 04/22/17 2012 04/23/17 0535  BP: (!) 168/62 (!) 121/56 (!) 123/54 (!) 140/58  Pulse: 76 70 79 73  Resp: 17 16 16 16   Temp: 98 F (36.7 C) 97.8 F (36.6 C) 97.9 F (36.6 C) 98.1 F (36.7 C)  TempSrc: Oral Oral Oral Oral  SpO2: 100% 97% 98% 99%  Weight:      Height:        General: Pt is alert, awake, not in acute distress Cardiovascular: RRR, S1/S2 +, no rubs, no gallops Respiratory: CTA bilaterally, no wheezing, no rhonchi Abdominal: Soft, NT, ND, bowel sounds + Extremities: no edema, no cyanosis   The results of significant diagnostics from this hospitalization (including imaging, microbiology, ancillary and laboratory) are listed below for reference.     Microbiology: Recent Results (from the past 240 hour(s))  Blood culture (routine x 2)     Status: None   Collection Time: 04/15/17  8:13 AM  Result Value Ref Range Status   Specimen Description BLOOD LEFT ANTECUBITAL  Final   Special Requests   Final    BOTTLES DRAWN AEROBIC AND ANAEROBIC Blood Culture adequate volume   Culture   Final    NO GROWTH 5 DAYS Performed at [facility], [address] [place], [place] [address]    Report Status 04/20/2017 FINAL  Final  Blood culture (routine x 2)     Status: None   Collection Time: 04/15/17  8:14 AM  Result Value Ref Range Status   Specimen Description BLOOD LEFT HAND  Final   Special Requests   Final    BOTTLES DRAWN AEROBIC AND ANAEROBIC Blood Culture adequate volume   Culture   Final    NO GROWTH 5 DAYS Performed at [facility], [place] [address], [place], [place] [address]    Report Status 04/20/2017 FINAL  Final  MRSA PCR Screening     Status: None   Collection Time: 04/17/17  9:30 AM  Result Value Ref Range Status   MRSA by PCR NEGATIVE NEGATIVE Final    Comment:        The GeneXpert MRSA Assay  (FDA approved for NASAL specimens only), is one component of a comprehensive MRSA colonization surveillance program. It is not intended to diagnose MRSA infection nor to guide or monitor treatment for MRSA infections.      Labs: BNP (last 3 results) No results for input(s): BNP in the last 8760 hours. Basic Metabolic Panel:  Recent Labs Lab 04/17/17 1201  04/19/17 0306  04/20/17 0506  04/21/17 0350 04/21/17 1218 04/22/17 0530 04/22/17 1927 04/23/17 0539  NA 108*  < > 123*  123*  < > 117*  < > 126* 123* 124* 126* 127*  K 3.1*  < > 3.2*  --  3.2*  --   --  5.3* 4.1  --  3.8  CL 77*  < > 93*  --  83*  --   --  89* 88*  --  91*  CO2 25  < > 24  --  24  --   --  27 28  --  26  GLUCOSE 110*  < > 106*  --  88  --   --  174* 92  --  110*  BUN 8  < > <5*  --  7  --   --  16 16  --  19  CREATININE 0.36*  < > 0.34*  --  0.37*  --   --  0.65 0.54  --  0.59  CALCIUM 6.8*  < > 6.3*  --  6.9*  --   --  8.6* 8.1*  --  8.8*  MG 1.3*  --   --   --   --   --   --   --   --   --   --   < > = values in this interval not displayed. Liver Function Tests: No results for input(s): AST, ALT, ALKPHOS, BILITOT, PROT, ALBUMIN in the last 168 hours. No results for input(s): LIPASE, AMYLASE in the last 168 hours. No results for input(s): AMMONIA in the last 168 hours. CBC:  Recent Labs Lab 04/20/17 0506  WBC 2.5*  HGB 9.0*  HCT 24.0*  MCV 92.7  PLT 208   Cardiac Enzymes: No results for input(s): CKTOTAL, CKMB, CKMBINDEX, TROPONINI in the last 168 hours. BNP: Invalid input(s): POCBNP CBG: No results for input(s): GLUCAP in the last 168 hours. D-Dimer No results for input(s): DDIMER in the last 72 hours. Hgb A1c No results for input(s): HGBA1C in the last 72 hours. Lipid Profile No results for input(s): CHOL, HDL, LDLCALC, TRIG, CHOLHDL, LDLDIRECT in the last 72 hours. Thyroid function studies No results for input(s): TSH, T4TOTAL, T3FREE, THYROIDAB in the last 72 hours.  Invalid  input(s): FREET3 Anemia work up No results for input(s): VITAMINB12, FOLATE, FERRITIN, TIBC, IRON, RETICCTPCT in the last 72 hours. Urinalysis    Component Value Date/Time   COLORURINE RED (A) 12/20/2015 0452   APPEARANCEUR TURBID (A) 12/20/2015 0452   LABSPEC 1.014 12/20/2015 0452   PHURINE 6.5 12/20/2015 0452   GLUCOSEU NEGATIVE 12/20/2015 0452   HGBUR LARGE (A) 12/20/2015 0452   BILIRUBINUR NEGATIVE 12/20/2015 [place] 12/23/2014 1902   KETONESUR NEGATIVE 12/20/2015 0452   PROTEINUR 100 (A) 12/20/2015 0452   UROBILINOGEN 0.2 12/23/2014 1902  NITRITE NEGATIVE 12/20/2015 0452   LEUKOCYTESUR SMALL (A) 12/20/2015 0452   Sepsis Labs Invalid input(s): PROCALCITONIN,  WBC,  LACTICIDVEN Microbiology Recent Results (from the past 240 hour(s))  Blood culture (routine x 2)     Status: None   Collection Time: 04/15/17  8:13 AM  Result Value Ref Range Status   Specimen Description BLOOD LEFT ANTECUBITAL  Final   Special Requests   Final    BOTTLES DRAWN AEROBIC AND ANAEROBIC Blood Culture adequate volume   Culture   Final    NO GROWTH 5 DAYS Performed at [facility], [address] [name], [place] [address]    Report Status 04/20/2017 FINAL  Final  Blood culture (routine x 2)     Status: None   Collection Time: 04/15/17  8:14 AM  Result Value Ref Range Status   Specimen Description BLOOD LEFT HAND  Final   Special Requests   Final    BOTTLES DRAWN AEROBIC AND ANAEROBIC Blood Culture adequate volume   Culture   Final    NO GROWTH 5 DAYS Performed at [facility], [place] [address] [place], [place] [address]    Report Status 04/20/2017 FINAL  Final  MRSA PCR Screening     Status: None   Collection Time: 04/17/17  9:30 AM  Result Value Ref Range Status   MRSA by PCR NEGATIVE NEGATIVE Final    Comment:        The GeneXpert MRSA Assay (FDA approved for NASAL specimens only), is one component of a comprehensive MRSA colonization surveillance program.  It is not intended to diagnose MRSA infection nor to guide or monitor treatment for MRSA infections.     Time coordinating discharge: 35 minutes  SIGNED:  Chipper Oman, MD  [facility] 04/23/2017, 11:44 AM  Pager please text page via  [email or web address] Password [email or web address]

## 2017-04-23 NOTE — Discharge Instructions (Signed)
T?ng huy?t p Hypertension T?ng huy?t p, th??ng ???c g?i l huy?t p cao, l khi l?c b?m mu qua ??ng m?ch c?a qu v? qu m?nh. ??ng m?ch c?a qu v? l cc m?ch mu mang mu t? tim ?i kh?p c? th?. T?ng huy?t p khi?n tim lm vi?c v?t v? h?n ?? b?m mu v c th? khi?n cc ??ng m?ch tr? ln h?p ho?c c?ng. T?ng huy?t p khng ???c ?i?u tr? ho?c khng ki?m sot ???c c th? d?n t?i nh?i mu c? tim, ??t qu?, b?nh th?n v nh?ng v?n ?? khc. Ch? s? ?o huy?t p g?m m?t ch? s? cao trn m?t ch? s? th?p. Huy?t a?p ly? t???ng cu?a quy? vi? la? d??i 120/80. Ch? s? ??u tin ("??nh") ???c g?i l huy?t p tm thu. ?y l s? ?o p su?t trong ??ng m?ch khi tim qu v? ??p. Ch? s? th? hai ("?y") ???c g?i l huy?t p tm tr??ng. ?y l s? ?o p su?t trong ??ng m?ch khi tim qu v? ngh?Lourdes Sledge nhn g gy ra? Khng r nguyn nhn gy ra tnh tr?ng ny. ?i?u g lm t?ng nguy c?? M?t s? y?u t? nguy c? d?n ??n huy?t p cao c th? ki?m sot ???c. M?t s? y?u t? khc th khng. Nh?ng y?u t? qu v? c th? thay ??i   Ht thu?c.  B? b?nh ti?u ???ng tup 2, cholesterol cao, ho?c c? hai.  Khng t?p th? d?c ho?c cc ho?t ??ng th? ch?t ??y ??Marland Kitchen  Th?a cn.  ?n qu nhi?u ch?t bo, ???ng, ca-lo, ho?c mu?i (Natri).  U?ng qu nhi?u r??u. Nh?ng y?u t? kh ho?c khng th? thay ??i   B?nh th?n m?n tnh.  C ti?n s? gia ?nh b? cao huy?t p.  ?? tu?i. Nguy c? t?ng ln theo ?? tu?i.  Ch?ng t?c. Qu v? c th? c nguy c? cao h?n n?u qu v? l ng??i M? g?c Phi.  Gi?i tnh. Nam gi?i c nguy c? cao h?n ph? n? tr??c tu?i 45. Sau tu?i 65, ph? n? c nguy c? cao h?n nam gi?i.  Ng?ng th? do t?c ngh?n khi ng?.  C?ng th?ng. Cc d?u hi?u ho?c tri?u ch?ng l g? Huy?t p qu cao (c?n t?ng huy?t p) c th? gy ra:  ?au ??u.  Lo u.  Kh th?.  Ch?y mu cam.  Bu?n nn v nn.  ?au ng?c n?ng.  C? ??ng gi?t gi?t qu v? khng th? ki?m sot ???c (co gi?t). Ch?n ?on tnh tr?ng ny nh? th? no? Tnh tr?ng ny ???c ch?n ?on b?ng  cch ?o huy?t p c?a qu v? lc qu v? ng?i, ?? tay trn m?t m?t ph?ng. B?ng qu?n thi?t b? ?o huy?t p s? ???c qu?n tr?c ti?pvo vng da cnh tay pha trn c?a qu v? ngang v?i m?c tim. Huy?t p c?n ???c ?o t nh?t hai l?n trn cng m?t cnh tay. M?t s? tnh tr?ng nh?t ??nh c th? lm cho huy?t p khc nhau gi?a tay ph?i v tay tri c?a qu v?. M?t s? y?u t? nh?t ??nh c th? khi?n ch? s? ?o huy?t p th?p h?n ho?c cao h?n so v?i bnh th??ng (t?ng) trong th?i gian ng?n:  Khi huy?t p c?a qu v? ? phng khm c?a chuyn gia ch?m [place] s?c kh?e cao h?n so v?i lc qu v? ? nh, hi?n t??ng ny ???c g?i l t?ng huy?t p o chong tr?ng. H?u h?t nh?ng ng??i b? tnh tr?ng ny ??u khng c?n dng thu?c.  Khi  huy?t p c?a qu v? lc ? nh cao h?n so v?i lc qu v? ? phng khm chuyn gia ch?m [place] s?c kh?e, hi?n t??ng ny ???c g?i l t?ng huy?t p m?t n?. H?u h?t nh?ng ng??i b? tnh tr?ng ny ??u c th? c?n dng thu?c ?? ki?m sot huy?t p. N?u qu v? c ch? s? huy?t p cao trong m?t l?n khm ho?c qu v? c huy?t p bnh th??ng c km cc y?u t? nguy c? khc:  Qu v? c th? ???c yu c?u tr? l?i vo m?t ngy khc ?? ki?m tra l?i huy?t p.  Qu v? c th? ???c yu c?u theo di huy?t p t?i nh trong vng 1 tu?n ho?c lu h?n. N?u qu v? ???c ch?n ?on b? t?ng huy?t p, qu v? c th? c?n th?c hi?n cc xt nghi?m mu ho?c ki?m tra hnh ?nh khc ?? gip chuyn gia ch?m [place] s?c kh?e hi?u nguy c? t?ng th? m?c cc b?nh tr?ng khc. Tnh tr?ng ny ???c ?i?u tr? nh? th? no? Tnh tr?ng ny ???c ?i?u tr? b?ng cch thay ??i l?i s?ng lnh m?nh, ch?ng h?nh nh? ?n th?c ph?m c l?i cho s?c kh?e, t?p th? d?c nhi?u h?n v gi?m l??ng r??u u?ng vo. N?u thay ??i l?i s?ng khng ?? ?? ??a huy?t p v? m?c c th? ki?m sot ???c, chuyn gia ch?m [place] s?c kh?e c th? k ??n thu?c, v n?u:  Huy?t p tm thu c?a qu v? trn 130.  Huy?t p tm tr??ng c?a qu v? trn 80. Huy?t p m?c tiu c nhn c?a qu v? c th? khc nhau ty thu?c v tnh tr?ng  b?nh l, tu?i v cc nhn t? khc. Tun th? nh?ng h??ng d?n ny ? nh: ?n v u?ng   ?n ch? ?? giu ch?t x? v kali v t natri, ???ng ph? gia v ch?t bo. M?t k? ho?ch ?n m?u c tn ch? ?? ?n DASH (Cch ti?p c?n ?n u?ng ?? gi?m t?ng huy?t p). ?n theo cch ny:  ?n nhi?u tri cy v rau t??i. Vo m?i b?a ?n, c? g?ng dnh m?t n?a ??a cho tri cy v rau.  ?n ng? c?c nguyn h?t, ch?ng h?n nh? m ?ng lm t? b?t m nguyn cm, ho?c bnh m nguyn h?t. Cho ngu? c?c nguyn ca?m va?o m?t ph?n t? ??a c?a quy? vi?.  ?n ho?c hu?ng cc s?n ph?m t? s?a t bo, ch?ng h?n nh? s?a ? b? kem ho?c s?a chua t bo.  Trnh nh?ng mi?ng th?t nhi?u m?, th?t ? qua ch? bi?n ho?c th?t ??p mu?i v th?t gia c?m c da. Dnh kho?ng m?t ph?n t? ??a c?a qu v? cho cc protein khng m?, ch?ng h?n nh? c, th?t g khng da, ??u, tr?ng, v ??u ph?.  Trnh nh?ng th?c ph?m ch? bi?n ho?c lm s?n. Nh?ng th?c ph?m ny th??ng c nhi?u natri, ???ng ph? gia v ch?t bo h?n.  Gi?m l??ng dng natri hng ngy c?a qu v?. H?u h?t nh?ng ng??i b? t?ng huy?t p ??u nn ?n d??i 1.500 mg natri m?i ngy.  Gi?i h?n l??ng r??u qu v? u?ng khng qu 1 ly m?i ngy v?i ph? n? khng mang thai v 2 ly m?i ngy v?i nam gi?i. M?t ly t??ng ???ng v?i 12 ao-x? bia, 5 ao-x? r??u vang, ho?c 1 ao-x? r??u m?nh. L?i s?ng   H?p tc v?i chuyn gia ch?m [place] s?c kh?e c?a qu v? ?? duy tr tr?ng l??ng c? th? c l?i cho  s?c kh?e ho?c gi?m cn. Hy h?i xem tr?ng l??ng no l l t??ng cho qu v?.  Dnh t nh?t 30 pht ?? t?p th? d?c m c th? khi?n tim qu v? ??p nhanh h?n (t?p th? d?c nh?p ?i?u) h?u h?t cc ngy trong tu?n. Cc ho?t ??ng c th? bao g?m ?i b?, b?i, ho?c ??p xe.  Bao g?m bi t?p t?ng c??ng c? (bi t?p khng l?c), ch?ng h?n nh? bi t?p [place] ho?c nng t?, nh? m?t ph?n c?a thi quen luy?n t?p hng tu?n c?a qu v?. C? g?ng t?p nh?ng lo?i bi t?p ny trong vng 30 pht t?i thi?u 3 ngy m?t tu?n.  Khng s? d?ng b?t k? s?n ph?m no ch?a nicotine ho?c  thu?c l, ch?ng ha?n nh? thu?c l d?ng ht v thu?c l ?i?n t?. N?u qu v? c?n gip ?? ?? cai thu?c, hy h?i chuyn gia ch?m [place] s?c kh?e.  Theo di huy?t p c?a qu v? t?i nh theo h??ng d?n c?a chuyn gia ch?m [place] s?c kh?e.  Tun th? t?t c? cc cu?c h?n khm l?i theo ch? d?n c?a chuyn gia ch?m [place] s?c kh?e. ?i?u ny c vai tr quan tr?ng. Thu?c   Ch? s? d?ng thu?c khng k ??n v thu?c k ??n theo ch? d?n c?a chuyn gia ch?m [place] s?c kh?e. Lm theo ch? d?n m?t cch c?n th?n. Thu?c ?i?u tr? huy?t p ph?i ???c dng theo ??n ? k.  Khng b? li?u thu?c huy?t p. B? li?u khi?n qu v? c nguy c? g?p ph?i cc v?n ?? v c th? lm cho thu?c gi?m hi?u qu?Marland Kitchen  Hy h?i chuyn gia ch?m [place] s?c kh?e c?a qu v? v? nh?ng tc d?ng ph? ho?c ph?n ?ng v?i thu?c m qu v? ph?i theo di. Hy lin l?c v?i chuyn gia ch?m [place] s?c kh?e n?u:  Qu v? ngh? qu v? c ph?n ?ng v?i thu?c ?ang dng.  Qu v? b? ?au ??u ti?p t?c tr? l?i (ti pht).  Qu v? c?m th?y chng m?t.  Qu v? b? s?ng ph ? m?t c chn.  Qu v? c v?n ?? v? th? l?c. Yu c?u tr? gip ngay l?p t?c n?u:  Qu v? b? ?au ??u n?ng ho?c l l?n.  Qu v? b? y?u b?t th??ng ho?c t b.  Quy? vi? ca?m th?y bi? ng?t.  Qu v? b? ?au r?t nhi?u ? ng?c ho?c b?ng.  Qu v? nn nhi?u l?n.  Qu v? b? kh th?. Tm t?t  T?ng huy?t p l khi l?c b?m mu qua ??ng m?ch c?a qu v? qu m?nh. N?u tnh tr?ng ny khng ???c ki?m sot, n c th? khi?n qu v? g?p ph?i nguy c? bi?n ch?ng nghim tr?ng.  Huy?t p m?c tiu c nhn c?a qu v? c th? khc nhau ty thu?c v tnh tr?ng b?nh l, tu?i v cc nhn t? khc. ??i v?i h?u h?t m?i ng??i, huy?t p bnh th??ng l d??i 120/80.  ?i?u tr? t?ng huy?t p b?ng cch thay ??i l?i s?ng, dng thu?c, ho?c k?t h?p c? hai. Thay ??i l?i s?ng bao g?m gi?m cn, ?n ch? ?? ?n c l?i cho s?c kh?e, t mu?i, t?p th? d?c nhi?u h?n v h?n ch? u?ng r??u. Thng tin ny khng nh?m m?c ?ch thay th? cho l?i khuyn m chuyn gia ch?m [place] s?c  kh?e ni v?i qu v?. Hy b?o ??m qu v? ph?i th?o lu?n b?t k? v?n ?? g m qu v? c v?i chuyn gia ch?m [place]  s?c kh?e c?a qu v?. Document Released: 11/21/2005 Document Revised: 11/02/2016 Document Reviewed: 11/02/2016 Elsevier Interactive Patient Education  2017 Elsevier [employer]. Syndrome of Inappropriate Antidiuretic Hormone Syndrome of inappropriate antidiuretic hormone (SIADH) is when excess antidiuretic hormone (ADH) is produced in the body. This hormone is produced in a part of the brain called the hypothalamus. ADH controls water levels in the body. When too much of the hormone is produced, this can cause problems such as low salt (sodium) levels in your blood and reduced urine secretion. The body retains too much water, which can cause various symptoms. Symptoms can range from mild to severe. SIADH is most common in people who are hospitalized. What are the causes? Several conditions can cause SIADH, including:  Heart failure.  Injured or diseased hypothalamus.  Infection or inflammation in the brain, such as meningitis or encephalitis.  Lung disease or cancer.  Tumor.  Psychosis.  Major surgery.  Stroke.  Guillain-Barre syndrome.  Medicines, including chemotherapy drugs, NSAIDs, and pain killers.  Abnormalities in thyroid or parathyroid hormones.  Human immunodeficiency virus (HIV).  Hereditary causes. What increases the risk?  Increasing age.  Recent major surgery.  Hospitalization. What are the signs or symptoms?  Irritability.  Cramps.  Nausea or vomiting.  Mental changes, such as memory problems, aggressiveness, confusion, or hallucinations.  Seizure.  Coma. How is this diagnosed? Diagnosis may include:  Medical history and physical exam.  Blood tests.  Urine tests.  Chest X-ray or brain scan. How is this treated? Treatments can help reduce the symptoms of SIADH. Treatment may include the following:  Restriction of your fluid  intake.  Saline given through an IV tube or oral salt tablets to raise sodium levels to normal.  Medicines to help eliminate water from the body (diuretics).  Medicines to help control sodium levels. Various treatments may also be done to manage the underlying cause of your SIADH. Follow these instructions at home:  Follow your health care provider's instructions on restricting your intake of fluids.  Take medicines only as directed by your health care provider.  Keep all follow-up visits as directed by your health care provider. This is important. Contact a health care provider if:  You have cramping.  You are nauseous or vomiting.  You have mental changes, such as memory problems, aggressiveness, confusion, or hallucinations. Get help right away if: You have a seizure or loss of consciousness. This information is not intended to replace advice given to you by your health care provider. Make sure you discuss any questions you have with your health care provider. Document Released: 06/18/2014 Document Revised: 04/28/2016 Document Reviewed: 01/15/2014 Elsevier Interactive Patient Education  2017 [employer].

## 2017-04-26 ENCOUNTER — Telehealth: Payer: Self-pay | Admitting: Hematology and Oncology

## 2017-04-26 ENCOUNTER — Other Ambulatory Visit: Payer: Self-pay

## 2017-04-26 NOTE — Telephone Encounter (Signed)
Received Non-FMLA Disability forms from patient's daughter Aerianna Losey. The forms indicated that they were for Jessica Zavala as the patient. I advised her that I wouldn't be able to complete these forms unless it indicated they were for the care of her mom Jessica Zavala who is our patient. Called [place] to verify and they stated that the forms were for Jessica Zavala's medical condition. Spoke to [place] about this and she states that she will talk to her employer and [place] to get the correct forms sent to [place].

## 2017-04-26 NOTE — Patient Outreach (Signed)
[facility]) Care Management  04/26/2017  Jessica Zavala 12/15/1945 [id number]  Transition of care  Week # 1 Referral date: 04/26/17 Referral source: status post hospital  discharge on 04/23/17 Program Transition of care Insurance: [employer] Providers: Social support   SUBJECTIVE: Telephone call to patient for transition of care follow up. Unable to reach patient. HIPAA compliant voice message left with call back phone number.  PLAN; RNCM will attempt 2nd telephone call within 3 business days.   Quinn Plowman RN,BSN,CCM [facility] Telephonic  [phone]    PLAN:

## 2017-04-28 ENCOUNTER — Other Ambulatory Visit: Payer: Self-pay

## 2017-04-28 ENCOUNTER — Ambulatory Visit: Payer: Medicare HMO

## 2017-04-28 ENCOUNTER — Other Ambulatory Visit: Payer: Medicare HMO

## 2017-04-28 DIAGNOSIS — E871 Hypo-osmolality and hyponatremia: Secondary | ICD-10-CM | POA: Diagnosis not present

## 2017-04-28 DIAGNOSIS — I10 Essential (primary) hypertension: Secondary | ICD-10-CM | POA: Diagnosis not present

## 2017-04-28 DIAGNOSIS — J189 Pneumonia, unspecified organism: Secondary | ICD-10-CM | POA: Diagnosis not present

## 2017-04-28 NOTE — Patient Outreach (Signed)
[facility]) Care Management  04/28/2017  Jessica Zavala Dec 11, 1945 [id number]   Transition of care  Week # 1 Referral date: 04/26/17 Referral source: status post hospital  discharge on 04/23/17 Program Transition of care Insurance: [employer] Providers: Social support   SUBJECTIVE: Second telephone call to patient for transition of care follow up. Contact answering phone states patient is not available.  HIPAA compliant voice message left with call back phone number.  PLAN; RNCM will attempt 3rd telephone call within 3 business days.   Quinn Plowman RN,BSN,CCM [facility] Telephonic  [phone]

## 2017-05-02 ENCOUNTER — Ambulatory Visit: Payer: Self-pay

## 2017-05-02 DIAGNOSIS — L299 Pruritus, unspecified: Secondary | ICD-10-CM | POA: Diagnosis not present

## 2017-05-02 DIAGNOSIS — E871 Hypo-osmolality and hyponatremia: Secondary | ICD-10-CM | POA: Diagnosis not present

## 2017-05-02 DIAGNOSIS — Z6824 Body mass index (BMI) 24.0-24.9, adult: Secondary | ICD-10-CM | POA: Diagnosis not present

## 2017-05-02 DIAGNOSIS — I10 Essential (primary) hypertension: Secondary | ICD-10-CM | POA: Diagnosis not present

## 2017-05-04 ENCOUNTER — Other Ambulatory Visit: Payer: Medicare HMO

## 2017-05-04 ENCOUNTER — Ambulatory Visit: Payer: Medicare HMO

## 2017-05-04 ENCOUNTER — Other Ambulatory Visit: Payer: Self-pay

## 2017-05-04 ENCOUNTER — Ambulatory Visit: Payer: Medicare HMO | Admitting: Hematology and Oncology

## 2017-05-04 DIAGNOSIS — C50511 Malignant neoplasm of lower-outer quadrant of right female breast: Secondary | ICD-10-CM

## 2017-05-04 DIAGNOSIS — Z170 Estrogen receptor positive status [ER+]: Principal | ICD-10-CM

## 2017-05-04 DIAGNOSIS — C7951 Secondary malignant neoplasm of bone: Secondary | ICD-10-CM

## 2017-05-04 DIAGNOSIS — C50919 Malignant neoplasm of unspecified site of unspecified female breast: Secondary | ICD-10-CM

## 2017-05-04 MED ORDER — DENOSUMAB 120 MG/1.7ML ~~LOC~~ SOLN: 120.0000 mg | Freq: Once | SUBCUTANEOUS | Status: DC

## 2017-05-04 NOTE — Patient Instructions (Signed)
Denosumab injection ?y l thu?c g? DENOSUMAB lm ch?m s? suy y?u x??ng. Prolia ???c dng ?? ?i?u tr? ch?ng long x??ng ? ph? n? sau khi mn kinh v ? nam gi?i. Xgeva ???c dng ?? phng ng?a gy x??ng v cc v?n ?? khc v? x??ng gy ra b?i di c?n x??ng do ung th?[place] See c?ng ???c dng ?? ?i?u tr? b??u t? bo kh?ng l? (giant cell tumor) c?a x??ng. Thu?c ny c th? ???c dng cho nh?ng m?c ?ch khc; hy h?i ng??i cung c?p d?ch v? y t? ho?c d??c s? c?a mnh, n?u qu v? c th?c m?c. Ti c?n ph?i bo cho ng??i cung c?p d?ch v? y t? c?a mnh ?i?u g tr??c khi dng thu?c ny? H? c?n bi?t li?u qu v? c b?t k? tnh tr?ng no sau ?y khng: -b?nh v? r?ng -chm da -nhi?m trng ho?c ti?n s? nhi?m trng -b?nh th?n ho?c n?u qu v? ?ang ???c th?m phn -m?c calci ho?c vitamin D trong mu th?p -h?i ch?ng r?i lo?n h?p th? -???c x?p vo l?ch ?? gi?i ph?u ho?c nh? r?ng -?ang du?ng thu?c co? ch??a denosumab -b?nh tuy?n gip tr?ng ho?c tuy?n c?n gip tr?ng -ph?n ?ng b?t th??ng v?i denosumab -pha?n ??ng b?t th???ng ho??c di? ??ng v??i ca?c d??c ph?m kha?c -pha?n ??ng b?t th???ng ho??c di? ??ng v??i th??c ph?m, thu?c nhu?m, ho??c ch?t ba?o qua?n -?ang c thai ho??c ??nh co? thai -?ang cho con bu? Ti nn s? d?ng thu?c ny nh? th? no? Thu?c ny ?? tim d??i da. Thu?c ny ???c s? d?ng b?i chuyn vin y t? ? b?nh vi?n ho?c ? phng m?ch. N?u qu v? s?p dng thu?c Prolia, d??c s? s? ??a cho qu v? m?t B?n H??ng D?n v? D??c Ph?m (MedGuide) ??c bi?t cho m?i toa thu?c v cho m?i l?n mua thm thu?c ?. Hy b?o ??m ??c k? thng tin ny m?i l?n. ??i v?i Prolia, hy bn v?i bc s? nhi khoa c?a qu v? v? vi?c dng thu?c ny ? tr? em. C th? c?n ch?m [place] ??c bi?t. ??i v?i Xgeva, hy bn v?i bc s? nhi khoa c?a qu v? v? vi?c dng thu?c ny ? tr? em. Thu?c ny c th? ???c k toa cho tr? em ch? m?i 13 tu?i trong nh?ng tr??ng h?p ch?n l?c, nh?ng c?n ph?i th?n tr?ng. Qu li?u: N?u qu v? cho r?ng mnh ? dng qu nhi?u  thu?c ny, th hy lin l?c v?i trung tm ki?m sot ch?t ??c ho?c phng c?p c?u ngay l?p t?c. L?U : Thu?c ny ch? dnh ring cho qu v?. Khng chia s? thu?c ny v?i nh?ng ng??i khc. N?u ti l? qun m?t li?u th sao? ?i?u quan tr?ng l khng nn b? l? li?u thu?c no. Hy lin l?c v?i bc s? ho?c [place] vin y t? c?a mnh, n?u qu v? khng th? gi? ?ng cu?c h?n khm. Nh?ng g c th? t??ng tc v?i thu?c ny? Khng ???c dng thu?c ny cng v?i b?t k? th? no sau ?y: -nh??ng thu?c kha?c co? ch??a denosumab Thu?c ny c?ng c th? t??ng tc v?i cc thu?c sau ?y: -cc thu?c ?c ch? h? mi?n d?ch -cc thu?c ?i?u tr? b?nh ung th? -cc thu?c steroid, ch?ng h?n nh? prednisone ho?c cortisone Danh sch ny c th? khng m t? ?? h?t cc t??ng tc c th? x?y ra. Hy ??a cho ng??i cung c?p d?ch v? y t? c?a mnh danh sch t?t c? cc thu?c, th?o d??c, cc thu?c khng c?n toa, ho?c cc  ch? ph?m b? sung m qu v? dng. C?ng nn bo cho h? bi?t r?ng qu v? c ht thu?c, u?ng r??u, ho?c c s? d?ng ma ty tri php hay khng. Vi th? c th? t??ng tc v?i thu?c c?a qu v?. Ti c?n ph?i theo di ?i?u g trong khi dng thu?c ny? Hy ?i g?p bc s? ho?c chuyn vin y t? ?? theo di ??nh k? s? c?i thi?n c?a qu v?. Bc s? c th? yu c?u th? mu v lm cc xt nghi?m khc ?? theo di s? c?i thi?n c?a qu v?. Hy lin l?c v?i bc s? ho?c chuyn vin y t?, n?u qu v? b? c?m l?nh ho?c b? b?nh nhi?m trng khc trong khi dng thu?c ny. Khng ???c t? ?i?u tr? cho mnh. Thu?c ny c th? lm gi?m kh? n?ng ch?ng l?i cc b?nh nhi?m trng c?a c? th?. Qu v? c?n ph?i ch?c ch?n r?ng c? th? qu v? nh?n ???c ??y ?? l??ng calci v vitamin D trong th?i gian dng thu?c ny, tr? khi bc s? yu c?u qu v? ??ng lm nh? v?y. Hy th?o lu?n v?i bc s? ho?c chuyn vin y t? v? nh?ng th?c ph?m qu v? ?n v nh?ng vitamin qu v? dng. Hy ?i nha s? th??ng xuyn. ?nh r?ng ho?c x?a r?ng b?ng ch? nha khoa nh? ? ???c ch? d?n. N?u qu v? c ?i lm r?ng, th  hy bo v?i nha s? r?ng qu v? ?ang dng thu?c ny. Khng ???c c thai trong th?i gian dng thu?c ny ho?c trong vng 5 thng sau khi ng?ng dng thu?c. Ph? n? c?n ph?i thng bo cho bc s? c?a mnh, n?u mu?n c thai ho?c ngh? r?ng c th? mnh ? c [place]. C nguy c? v? cc tc d?ng ph? nghim tr?ng ??i v?i [place] nhi. Hy th?o lu?n v?i bc s? ho?c chuyn vin y t? ho?c d??c s? ?? bi?t thm thng tin. Ti c th? nh?n th?y nh?ng tc d?ng ph? no khi dng thu?c ny? Nh?ng tc d?ng ph? qu v? c?n ph?i bo cho bc s? ho?c chuyn vin y t? cng s?m cng t?t: -cc ph?n ?ng d? ?ng, ch?ng h?n nh? da b? m?n ??, ng?a, n?i my ?ay, s?ng ? m?t, mi, ho?c l??i -cc v?n ?? v? h h?p -?au ng?c -tim ??p nhanh ho?c khng ??u -c?m th?y chong vng, ng?t x?u, b? t -s?t, ?n l?nh, ho?c b?t k? d?u hi?u nhi?m trng no khc -co th?t, c?ng, ho?c gi?t c? b?p -t ho?c c?m gic nh? b? ki?n b -da b? r?p ho?c c u c?c, ho?c da b? kh, bong trc ho?c ?? -v?t th??ng lu lnh ho?c ?au khng r nguyn nhn ? mi?ng ho?c hm -b? b?m tm ho?c xu?t huy?t b?t th??ng Cc tc d?ng ph? khng c?n ph?i ch?m [place] y t? (Hy bo cho bc s? ho?c chuyn vin y t? n?u cc tc d?ng ph? ny ti?p di?n ho?c gy phi?n toi): -?au c? b?p -kh ch?u bao t?, ??y h?i Danh sch ny c th? khng m t? ?? h?t cc tc d?ng ph? c th? x?y ra. Xin g?i t?i bc s? c?a mnh ?? ???c c? v?n chuyn mn v? cc tc d?ng ph?Sander Nephew v? c th? t??ng trnh cc tc d?ng ph? cho FDA theo s? [phone]. Ti nn c?t gi? thu?c c?a mnh ? ?u? Thu?c ny ch? ???c dng trong phng m?ch, v?n phng bc s? ho?c c? s? y t? khc v s? khng ???c c?t gi?  t?i nh. L?U : ?y l b?n tm t?t. N c th? khng bao hm t?t c? thng tin c th? c. N?u qu v? th?c m?c v? thu?c ny, xin trao ??i v?i bc s?, d??c s?, ho?c ng??i cung c?p d?ch v? y t? c?a mnh.    2016, [employer] Standard. (2012-07-05 00:00:00)

## 2017-05-04 NOTE — Progress Notes (Signed)
Injection not given. Pt had no current CMET. Pt is to return to center on 05/08/2017 for Lab , MD, and injection appointment. Injection of Delton See will be given then if Calcium level is ok. Pt and family member agreed and informed of plan.

## 2017-05-07 NOTE — Assessment & Plan Note (Signed)
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  Ibrance toxicities: 1. Neutropenia: Today's ANC is  Currently on 75 mg dose 2weeks on followed by 2 weeks off Denies any hot flashes or myalgias related to letrozole.  Bone metastases: OnXgeva to q 4weeks. Patient currently takes calcium with vitamin D Hypertension: Instructed the patient to follow with her primary care physician  CT chest abdomen pelvis 11/03/2016: Stable internal mamm LN, Mixed lesions in T spine and ribs unchangedNo further pleural effusion. Excellent response to treatment. Hospitalization: Pneumonia and Confusion 5/12 and 5/20  She will come monthly for labs and Xgeva RTC in 8weeks for blood work check.

## 2017-05-08 ENCOUNTER — Encounter: Payer: Self-pay | Admitting: Hematology and Oncology

## 2017-05-08 ENCOUNTER — Ambulatory Visit (HOSPITAL_BASED_OUTPATIENT_CLINIC_OR_DEPARTMENT_OTHER): Payer: Medicare HMO | Admitting: Hematology and Oncology

## 2017-05-08 ENCOUNTER — Other Ambulatory Visit (HOSPITAL_BASED_OUTPATIENT_CLINIC_OR_DEPARTMENT_OTHER): Payer: Medicare HMO | Admitting: *Deleted

## 2017-05-08 ENCOUNTER — Other Ambulatory Visit (HOSPITAL_BASED_OUTPATIENT_CLINIC_OR_DEPARTMENT_OTHER): Payer: Medicare HMO

## 2017-05-08 ENCOUNTER — Other Ambulatory Visit: Payer: Self-pay | Admitting: Emergency Medicine

## 2017-05-08 ENCOUNTER — Ambulatory Visit (HOSPITAL_BASED_OUTPATIENT_CLINIC_OR_DEPARTMENT_OTHER): Payer: Medicare HMO

## 2017-05-08 ENCOUNTER — Other Ambulatory Visit: Payer: Self-pay

## 2017-05-08 DIAGNOSIS — Z170 Estrogen receptor positive status [ER+]: Principal | ICD-10-CM

## 2017-05-08 DIAGNOSIS — C50511 Malignant neoplasm of lower-outer quadrant of right female breast: Secondary | ICD-10-CM | POA: Diagnosis not present

## 2017-05-08 DIAGNOSIS — C7951 Secondary malignant neoplasm of bone: Secondary | ICD-10-CM

## 2017-05-08 DIAGNOSIS — C50919 Malignant neoplasm of unspecified site of unspecified female breast: Secondary | ICD-10-CM

## 2017-05-08 DIAGNOSIS — Z79811 Long term (current) use of aromatase inhibitors: Secondary | ICD-10-CM

## 2017-05-08 LAB — CBC WITH DIFFERENTIAL/PLATELET
BASO%: 0.5 % (ref 0.0–2.0)
Basophils Absolute: 0 10*3/uL (ref 0.0–0.1)
EOS%: 12 % — ABNORMAL HIGH (ref 0.0–7.0)
Eosinophils Absolute: 0.5 10*3/uL (ref 0.0–0.5)
HCT: 30.9 % — ABNORMAL LOW (ref 34.8–46.6)
HEMOGLOBIN: 10.4 g/dL — AB (ref 11.6–15.9)
LYMPH#: 1.5 10*3/uL (ref 0.9–3.3)
LYMPH%: 33.4 % (ref 14.0–49.7)
MCH: 34.3 pg — AB (ref 25.1–34.0)
MCHC: 33.7 g/dL (ref 31.5–36.0)
MCV: 102 fL — ABNORMAL HIGH (ref 79.5–101.0)
MONO#: 0.3 10*3/uL (ref 0.1–0.9)
MONO%: 5.6 % (ref 0.0–14.0)
NEUT#: 2.2 10*3/uL (ref 1.5–6.5)
NEUT%: 48.5 % (ref 38.4–76.8)
Platelets: 347 10*3/uL (ref 145–400)
RBC: 3.03 10*6/uL — ABNORMAL LOW (ref 3.70–5.45)
RDW: 14.9 % — ABNORMAL HIGH (ref 11.2–14.5)
WBC: 4.4 10*3/uL (ref 3.9–10.3)
nRBC: 0 % (ref 0–0)

## 2017-05-08 LAB — COMPREHENSIVE METABOLIC PANEL
ALBUMIN: 3.8 g/dL (ref 3.5–5.0)
ALK PHOS: 38 U/L — AB (ref 40–150)
ALT: 18 U/L (ref 0–55)
ANION GAP: 7 meq/L (ref 3–11)
AST: 21 U/L (ref 5–34)
BUN: 18.6 mg/dL (ref 7.0–26.0)
CALCIUM: 9 mg/dL (ref 8.4–10.4)
CO2: 27 mEq/L (ref 22–29)
Chloride: 104 mEq/L (ref 98–109)
Creatinine: 0.7 mg/dL (ref 0.6–1.1)
EGFR: 85 mL/min/{1.73_m2} — AB (ref >90)
GLUCOSE: 95 mg/dL (ref 70–140)
POTASSIUM: 3.9 meq/L (ref 3.5–5.1)
Sodium: 139 mEq/L (ref 136–145)
TOTAL PROTEIN: 6.9 g/dL (ref 6.4–8.3)
Total Bilirubin: 0.4 mg/dL (ref 0.20–1.20)

## 2017-05-08 MED ORDER — DENOSUMAB 120 MG/1.7ML ~~LOC~~ SOLN
120.0000 mg | Freq: Once | SUBCUTANEOUS | Status: AC
  Administered 2017-05-08: 120 mg via SUBCUTANEOUS
  Filled 2017-05-08: qty 1.7

## 2017-05-08 MED ORDER — PALBOCICLIB 75 MG PO CAPS: 75.0000 mg | ORAL_CAPSULE | Freq: Every day | ORAL | 6 refills | Status: DC

## 2017-05-08 MED FILL — IBRANCE 75 MG CAPSULE: 75 | 28 days supply | Qty: 14 | Fill #0

## 2017-05-08 NOTE — Patient Outreach (Signed)
[facility]) Care Management  05/08/2017  Jessica Zavala Feb 20, 1946 [id number]  Transition of care  Referral date: 04/26/17 Referral source: status post hospital  discharge on 04/23/17 Program Transition of care Insurance: [employer] Providers: Social support  Third telephone attempt to patient for transition of care follow up.  Call attempted to listed spouse number as indicated in patient chart.  Message stated person is not accepting calls at this time.  Second call attempted to listed home number.  HIPAA compliant voice message left with call back phone number.   PLAN: RNCM will send outreach letter to patient to attempt contact.   Quinn Plowman RN,BSN,CCM [facility] Telephonic  [phone]

## 2017-05-08 NOTE — Patient Instructions (Signed)
Denosumab injection ?y l thu?c g? DENOSUMAB lm ch?m s? suy y?u x??ng. Prolia ???c dng ?? ?i?u tr? ch?ng long x??ng ? ph? n? sau khi mn kinh v ? nam gi?i. Xgeva ???c dng ?? phng ng?a gy x??ng v cc v?n ?? khc v? x??ng gy ra b?i di c?n x??ng do ung th?[place] See c?ng ???c dng ?? ?i?u tr? b??u t? bo kh?ng l? (giant cell tumor) c?a x??ng. Thu?c ny c th? ???c dng cho nh?ng m?c ?ch khc; hy h?i ng??i cung c?p d?ch v? y t? ho?c d??c s? c?a mnh, n?u qu v? c th?c m?c. Ti c?n ph?i bo cho ng??i cung c?p d?ch v? y t? c?a mnh ?i?u g tr??c khi dng thu?c ny? H? c?n bi?t li?u qu v? c b?t k? tnh tr?ng no sau ?y khng: -b?nh v? r?ng -chm da -nhi?m trng ho?c ti?n s? nhi?m trng -b?nh th?n ho?c n?u qu v? ?ang ???c th?m phn -m?c calci ho?c vitamin D trong mu th?p -h?i ch?ng r?i lo?n h?p th? -???c x?p vo l?ch ?? gi?i ph?u ho?c nh? r?ng -?ang du?ng thu?c co? ch??a denosumab -b?nh tuy?n gip tr?ng ho?c tuy?n c?n gip tr?ng -ph?n ?ng b?t th??ng v?i denosumab -pha?n ??ng b?t th???ng ho??c di? ??ng v??i ca?c d??c ph?m kha?c -pha?n ??ng b?t th???ng ho??c di? ??ng v??i th??c ph?m, thu?c nhu?m, ho??c ch?t ba?o qua?n -?ang c thai ho??c ??nh co? thai -?ang cho con bu? Ti nn s? d?ng thu?c ny nh? th? no? Thu?c ny ?? tim d??i da. Thu?c ny ???c s? d?ng b?i chuyn vin y t? ? b?nh vi?n ho?c ? phng m?ch. N?u qu v? s?p dng thu?c Prolia, d??c s? s? ??a cho qu v? m?t B?n H??ng D?n v? D??c Ph?m (MedGuide) ??c bi?t cho m?i toa thu?c v cho m?i l?n mua thm thu?c ?. Hy b?o ??m ??c k? thng tin ny m?i l?n. ??i v?i Prolia, hy bn v?i bc s? nhi khoa c?a qu v? v? vi?c dng thu?c ny ? tr? em. C th? c?n ch?m [place] ??c bi?t. ??i v?i Xgeva, hy bn v?i bc s? nhi khoa c?a qu v? v? vi?c dng thu?c ny ? tr? em. Thu?c ny c th? ???c k toa cho tr? em ch? m?i 13 tu?i trong nh?ng tr??ng h?p ch?n l?c, nh?ng c?n ph?i th?n tr?ng. Qu li?u: N?u qu v? cho r?ng mnh ? dng qu nhi?u  thu?c ny, th hy lin l?c v?i trung tm ki?m sot ch?t ??c ho?c phng c?p c?u ngay l?p t?c. L?U : Thu?c ny ch? dnh ring cho qu v?. Khng chia s? thu?c ny v?i nh?ng ng??i khc. N?u ti l? qun m?t li?u th sao? ?i?u quan tr?ng l khng nn b? l? li?u thu?c no. Hy lin l?c v?i bc s? ho?c [place] vin y t? c?a mnh, n?u qu v? khng th? gi? ?ng cu?c h?n khm. Nh?ng g c th? t??ng tc v?i thu?c ny? Khng ???c dng thu?c ny cng v?i b?t k? th? no sau ?y: -nh??ng thu?c kha?c co? ch??a denosumab Thu?c ny c?ng c th? t??ng tc v?i cc thu?c sau ?y: -cc thu?c ?c ch? h? mi?n d?ch -cc thu?c ?i?u tr? b?nh ung th? -cc thu?c steroid, ch?ng h?n nh? prednisone ho?c cortisone Dan[facility] sch ny c th? khng m t? ?? h?t cc t??ng tc c th? x?y ra. Hy ??a cho ng??i cung c?p d?ch v? y t? c?a mnh danh sch t?t c? cc thu?c, th?o d??c, cc thu?c khng c?n toa, ho?c cc  ch? ph?m b? sung m qu v? dng. C?ng nn bo cho h? bi?t r?ng qu v? c ht thu?c, u?ng r??u, ho?c c s? d?ng ma ty tri php hay khng. Vi th? c th? t??ng tc v?i thu?c c?a qu v?. Ti c?n ph?i theo di ?i?u g trong khi dng thu?c ny? Hy ?i g?p bc s? ho?c chuyn vin y t? ?? theo di ??nh k? s? c?i thi?n c?a qu v?. Bc s? c th? yu c?u th? mu v lm cc xt nghi?m khc ?? theo di s? c?i thi?n c?a qu v?. Hy lin l?c v?i bc s? ho?c chuyn vin y t?, n?u qu v? b? c?m l?nh ho?c b? b?nh nhi?m trng khc trong khi dng thu?c ny. Khng ???c t? ?i?u tr? cho mnh. Thu?c ny c th? lm gi?m kh? n?ng ch?ng l?i cc b?nh nhi?m trng c?a c? th?. Qu v? c?n ph?i ch?c ch?n r?ng c? th? qu v? nh?n ???c ??y ?? l??ng calci v vitamin D trong th?i gian dng thu?c ny, tr? khi bc s? yu c?u qu v? ??ng lm nh? v?y. Hy th?o lu?n v?i bc s? ho?c chuyn vin y t? v? nh?ng th?c ph?m qu v? ?n v nh?ng vitamin qu v? dng. Hy ?i nha s? th??ng xuyn. ?nh r?ng ho?c x?a r?ng b?ng ch? nha khoa nh? ? ???c ch? d?n. N?u qu v? c ?i lm r?ng, th  hy bo v?i nha s? r?ng qu v? ?ang dng thu?c ny. Khng ???c c thai trong th?i gian dng thu?c ny ho?c trong vng 5 thng sau khi ng?ng dng thu?c. Ph? n? c?n ph?i thng bo cho bc s? c?a mnh, n?u mu?n c thai ho?c ngh? r?ng c th? mnh ? c [place]. C nguy c? v? cc tc d?ng ph? nghim tr?ng ??i v?i [place] nhi. Hy th?o lu?n v?i bc s? ho?c chuyn vin y t? ho?c d??c s? ?? bi?t thm thng tin. Ti c th? nh?n th?y nh?ng tc d?ng ph? no khi dng thu?c ny? Nh?ng tc d?ng ph? qu v? c?n ph?i bo cho bc s? ho?c chuyn vin y t? cng s?m cng t?t: -cc ph?n ?ng d? ?ng, ch?ng h?n nh? da b? m?n ??, ng?a, n?i my ?ay, s?ng ? m?t, mi, ho?c l??i -cc v?n ?? v? h h?p -?au ng?c -tim ??p nhanh ho?c khng ??u -c?m th?y chong vng, ng?t x?u, b? t -s?t, ?n l?nh, ho?c b?t k? d?u hi?u nhi?m trng no khc -co th?t, c?ng, ho?c gi?t c? b?p -t ho?c c?m gic nh? b? ki?n b -da b? r?p ho?c c u c?c, ho?c da b? kh, bong trc ho?c ?? -v?t th??ng lu lnh ho?c ?au khng r nguyn nhn ? mi?ng ho?c hm -b? b?m tm ho?c xu?t huy?t b?t th??ng Cc tc d?ng ph? khng c?n ph?i ch?m  y t? (Hy bo cho bc s? ho?c chuyn vin y t? n?u cc tc d?ng ph? ny ti?p di?n ho?c gy phi?n toi): -?au c? b?p -kh ch?u bao t?, ??y h?i Dan[facility] sch ny c th? khng m t? ?? h?t cc tc d?ng ph? c th? x?y ra. Xin g?i t?i bc s? c?a mnh ?? ???c c? v?n chuyn mn v? cc tc d?ng ph?Sander Nephew v? c th? t??ng trnh cc tc d?ng ph? cho FDA theo s? [phone]. Ti nn c?t gi? thu?c c?a mnh ? ?u? Thu?c ny ch? ???c dng trong phng m?ch, v?n phng bc s? ho?c c? s? y t? khc v s? khng ???c c?t gi?  t?i nh. L?U : ?y l b?n tm t?t. N c th? khng bao hm t?t c? thng tin c th? c. N?u qu v? th?c m?c v? thu?c ny, xin trao ??i v?i bc s?, d??c s?, ho?c ng??i cung c?p d?ch v? y t? c?a mnh.    2016, Elsevier/Gold Standard. (2012-07-05 00:00:00)

## 2017-05-08 NOTE — Progress Notes (Signed)
Patient Care Team: [name], MD as PCP - General (Internal Medicine) Dannielle Karvonen, RN as [place] Management  DIAGNOSIS:  Encounter Diagnosis  Name Primary?  . Malignant neoplasm of lower-outer quadrant of right breast of female, estrogen receptor positive ([place])     SUMMARY OF ONCOLOGIC HISTORY:   Breast cancer of lower-outer quadrant of right female breast ([place])   01/04/1995 Surgery    Right breast cancer status post lumpectomy followed by chemotherapy followed by radiation and tamoxifen 5 years (details are not available)      12/30/2015 Relapse/Recurrence    Chest wall/ soft tissue masses near the right side of the sternum, superior nodule measuring 14.5 mm, inferior nodule measuring 10.5 mm, 26 mm anterior mediastinal soft tissue mass, T9 bone metastases      12/30/2015 Procedure    Thoracentesis: 400 mL removed; showed reactive mesothelial cells, no cancer cells      12/30/2015 Imaging    CT chest: 26 x 17 mm anterior mediastinal soft tissue mass, mixed lytic and sclerotic bone mets mainly involving sternum and T9 vertebral body, scattered vertebral body involvement and rib involvement; chest wall soft tissue masses 14.5 mm, 10.5 mm      01/13/2016 Initial Biopsy    Right chest wall mass biopsy 2:30 position: Invasive ductal carcinoma focally involves skeletal muscle, grade 2, HER-2 negative ratio 1.08, ER/PR 95% positive      01/14/2016 PET scan    Widespread metastatic disease right breast, right chest wall, mediastinum, right internal mammary lymph nodes, right pleural space, malignant pleural effusion, right common iliac lymph node, bone metastases in the thorax      01/21/2016 -  Anti-estrogen oral therapy    Ibrance with letrozole      04/15/2017 - 04/23/2017 Hospital Admission    Pneumonia and confusion       CHIEF COMPLIANT: Follow-up of recent hospitalization for pneumonia and confusion  INTERVAL HISTORY: Jessica Zavala is a  71 year old with above-mentioned history of metastatic breast cancer currently on Palbociclib and letrozole. She was taken off Palbociclib when she was hospitalized for pneumonia and confusion. She recovered from this completely and the most recent chest x-ray done by her primary care physician did not show any further evidence of pneumonia. She feels perfectly back to her normal self at this time.  REVIEW OF SYSTEMS:   Constitutional: Denies fevers, chills or abnormal weight loss Eyes: Denies blurriness of vision Ears, nose, mouth, throat, and face: Denies mucositis or sore throat Respiratory: Denies cough, dyspnea or wheezes Cardiovascular: Denies palpitation, chest discomfort Gastrointestinal:  Denies nausea, heartburn or change in bowel habits Skin: Denies abnormal skin rashes Lymphatics: Denies new lymphadenopathy or easy bruising Neurological:Denies numbness, tingling or new weaknesses Behavioral/Psych: Mood is stable, no new changes  Extremities: No lower extremity edema Breast:  denies any pain or lumps or nodules in either breasts All other systems were reviewed with the patient and are negative.  I have reviewed the past medical history, past surgical history, social history and family history with the patient and they are unchanged from previous note.  ALLERGIES:  is allergic to aspirin.  MEDICATIONS:  Current Outpatient Prescriptions  Medication Sig Dispense Refill  . acetaminophen (TYLENOL) 325 MG tablet Take 650 mg by mouth every 6 (six) hours as needed for mild pain.    Marland Kitchen amLODipine (NORVASC) 10 MG tablet Take 1 tablet (10 mg total) by mouth daily. 30 tablet 0  . atorvastatin (LIPITOR) 20 MG tablet  Take 1 tablet (20 mg total) by mouth daily. 30 tablet 11  . benzonatate (TESSALON) 100 MG capsule Take 1-2 capsules (100-200 mg total) by mouth 3 (three) times daily as needed for cough. 40 capsule 0  . clobetasol cream (TEMOVATE) 7.84 % Apply 1 application topically 2 (two)  times daily as needed. 30 g 0  . Dextromethorphan-Guaifenesin (MUCINEX DM MAXIMUM STRENGTH) 60-1200 MG TB12 Take 1 tablet by mouth every 12 (twelve) hours. 14 each 1  . furosemide (LASIX) 20 MG tablet Take 1 tablet (20 mg total) by mouth daily. 30 tablet 0  . labetalol (NORMODYNE) 200 MG tablet Take 1 tablet (200 mg total) by mouth 2 (two) times daily. 30 tablet 1  . letrozole (FEMARA) 2.5 MG tablet Take 1 tablet (2.5 mg total) by mouth daily. 90 tablet 3   No current facility-administered medications for this visit.     PHYSICAL EXAMINATION: ECOG PERFORMANCE STATUS: 1 - Symptomatic but completely ambulatory  Vitals:   05/08/17 1018  BP: (!) 110/54  Pulse: 75  Resp: 17  Temp: 97.7 F (36.5 C)   Filed Weights   05/08/17 1018  Weight: 116 lb 11.2 oz (52.9 kg)    GENERAL:alert, no distress and comfortable SKIN: skin color, texture, turgor are normal, no rashes or significant lesions EYES: normal, Conjunctiva are pink and non-injected, sclera clear OROPHARYNX:no exudate, no erythema and lips, buccal mucosa, and tongue normal  NECK: supple, thyroid normal size, non-tender, without nodularity LYMPH:  no palpable lymphadenopathy in the cervical, axillary or inguinal LUNGS: clear to auscultation and percussion with normal breathing effort HEART: regular rate & rhythm and no murmurs and no lower extremity edema ABDOMEN:abdomen soft, non-tender and normal bowel sounds MUSCULOSKELETAL:no cyanosis of digits and no clubbing  NEURO: alert & oriented x 3 with fluent speech, no focal motor/sensory deficits EXTREMITIES: No lower extremity edema  LABORATORY DATA:  I have reviewed the data as listed   Chemistry      Component Value Date/Time   NA 127 (L) 04/23/2017 0539   NA 125 (L) 03/31/2017 0804   K 3.8 04/23/2017 0539   K 5.2 (H) 03/31/2017 0804   CL 91 (L) 04/23/2017 0539   CO2 26 04/23/2017 0539   CO2 30 (H) 03/31/2017 0804   BUN 19 04/23/2017 0539   BUN 15.9 03/31/2017 0804    CREATININE 0.59 04/23/2017 0539   CREATININE 0.8 03/31/2017 0804      Component Value Date/Time   CALCIUM 8.8 (L) 04/23/2017 0539   CALCIUM 9.7 03/31/2017 0804   ALKPHOS 45 03/31/2017 0804   AST 26 03/31/2017 0804   ALT 15 03/31/2017 0804   BILITOT 0.52 03/31/2017 0804       Lab Results  Component Value Date   WBC 2.5 (L) 04/20/2017   HGB 9.0 (L) 04/20/2017   HCT 24.0 (L) 04/20/2017   MCV 92.7 04/20/2017   PLT 208 04/20/2017   NEUTROABS 1.6 (L) 04/15/2017    ASSESSMENT & PLAN:  Breast cancer of lower-outer quadrant of right female breast ([place]) Metastatic breast cancer PET-CT 01/14/16: Widespread metastatic disease right breast, right chest wall several nodules largest 3.7 cm, mediastinum, right internal mammary lymph node 2.2 cm, right pleural-based nodules 2.1cm and 2.7 cm, malignant pleural effusion, right common iliac lymph node, bone metastases in the thorax (sternum, manubrium, multiple thoracic vertebral bodies, multiple right-sided ribs)  Right chest wall mass biopsy 2:30 position 01/13/2016: Invasive ductal carcinoma focally involves skeletal muscle, grade 2, HER-2 negative ratio 1.08, ER/PR pending (  Right breast cancer 1996 status post lumpectomy followed by chemotherapy followed by radiation and tamoxifen 5 years (details are not available)) ---------------------------------------------------------------------------------------------------------------------------------------------------------- Treatment plan: Ibrance with letrozole started 01/20/2016 , cycle 3 Ibrance 100 mg, Cycle 4 Ibrance 75 mg dose; switch to 75 mg 3 weeks on 2 weeks of from 07/22/2016, changed to 2 weeks on 2 weeks off 11/04/2016.  Ibrance toxicities: 1. Neutropenia: Today's ANC is  Currently on 75 mg dose 2weeks on followed by 2 weeks off Denies any hot flashes or myalgias related to letrozole.  Bone metastases: OnXgeva to q 4weeks. Patient currently takes calcium with vitamin D CT chest  abdomen pelvis 11/03/2016: Stable internal mamm LN, Mixed lesions in T spine and ribs unchangedNo further pleural effusion. Excellent response to treatment. Hospitalization: Pneumonia and Confusion 5/12 and 5/20, patient completely recovered. We will order CT scans prior to her next visit. She will come monthly for labs and Xgeva and follow up  I spent 25 minutes talking to the patient of which more than half was spent in counseling and coordination of care.  No orders of the defined types were placed in this encounter.  The patient has a good understanding of the overall plan. she agrees with it. she will call with any problems that may develop before the next visit here.   Rulon Eisenmenger, MD 05/08/17

## 2017-05-12 DIAGNOSIS — E784 Other hyperlipidemia: Secondary | ICD-10-CM | POA: Diagnosis not present

## 2017-05-12 DIAGNOSIS — R8290 Unspecified abnormal findings in urine: Secondary | ICD-10-CM | POA: Diagnosis not present

## 2017-05-12 DIAGNOSIS — I10 Essential (primary) hypertension: Secondary | ICD-10-CM | POA: Diagnosis not present

## 2017-05-19 DIAGNOSIS — L409 Psoriasis, unspecified: Secondary | ICD-10-CM | POA: Diagnosis not present

## 2017-05-19 DIAGNOSIS — C7951 Secondary malignant neoplasm of bone: Secondary | ICD-10-CM | POA: Diagnosis not present

## 2017-05-19 DIAGNOSIS — D6481 Anemia due to antineoplastic chemotherapy: Secondary | ICD-10-CM | POA: Diagnosis not present

## 2017-05-19 DIAGNOSIS — I700 Atherosclerosis of aorta: Secondary | ICD-10-CM | POA: Diagnosis not present

## 2017-05-19 DIAGNOSIS — E871 Hypo-osmolality and hyponatremia: Secondary | ICD-10-CM | POA: Diagnosis not present

## 2017-05-19 DIAGNOSIS — D702 Other drug-induced agranulocytosis: Secondary | ICD-10-CM | POA: Diagnosis not present

## 2017-05-19 DIAGNOSIS — C50511 Malignant neoplasm of lower-outer quadrant of right female breast: Secondary | ICD-10-CM | POA: Diagnosis not present

## 2017-05-19 DIAGNOSIS — Z0000 Encounter for general adult medical examination without abnormal findings: Secondary | ICD-10-CM | POA: Diagnosis not present

## 2017-05-19 DIAGNOSIS — D649 Anemia, unspecified: Secondary | ICD-10-CM | POA: Diagnosis not present

## 2017-05-22 ENCOUNTER — Other Ambulatory Visit: Payer: Self-pay

## 2017-05-22 DIAGNOSIS — E871 Hypo-osmolality and hyponatremia: Secondary | ICD-10-CM | POA: Diagnosis not present

## 2017-05-22 DIAGNOSIS — C7951 Secondary malignant neoplasm of bone: Secondary | ICD-10-CM | POA: Diagnosis not present

## 2017-05-22 DIAGNOSIS — I10 Essential (primary) hypertension: Secondary | ICD-10-CM | POA: Diagnosis not present

## 2017-05-22 DIAGNOSIS — C50511 Malignant neoplasm of lower-outer quadrant of right female breast: Secondary | ICD-10-CM | POA: Diagnosis not present

## 2017-05-22 DIAGNOSIS — D702 Other drug-induced agranulocytosis: Secondary | ICD-10-CM | POA: Diagnosis not present

## 2017-05-22 DIAGNOSIS — R05 Cough: Secondary | ICD-10-CM | POA: Diagnosis not present

## 2017-05-22 DIAGNOSIS — J189 Pneumonia, unspecified organism: Secondary | ICD-10-CM | POA: Diagnosis not present

## 2017-05-22 DIAGNOSIS — D6481 Anemia due to antineoplastic chemotherapy: Secondary | ICD-10-CM | POA: Diagnosis not present

## 2017-05-22 DIAGNOSIS — E785 Hyperlipidemia, unspecified: Secondary | ICD-10-CM | POA: Diagnosis not present

## 2017-05-22 NOTE — Patient Outreach (Signed)
[facility]) Care Management  05/22/2017  Jessica Zavala [birth year] [id number]   No response from patient after 3 telephone calls and outreach letter.   PLAN:.  RNCM will refer patient to care management assistant to close due to being unable to reach [name] will notify patients primary MD of closure  Quinn Plowman RN,BSN,CCM [facility] Telephonic  [phone]

## 2017-05-23 DIAGNOSIS — Z1212 Encounter for screening for malignant neoplasm of rectum: Secondary | ICD-10-CM | POA: Diagnosis not present

## 2017-06-02 ENCOUNTER — Other Ambulatory Visit (HOSPITAL_BASED_OUTPATIENT_CLINIC_OR_DEPARTMENT_OTHER): Payer: Medicare HMO

## 2017-06-02 ENCOUNTER — Ambulatory Visit: Payer: Medicare HMO

## 2017-06-02 ENCOUNTER — Ambulatory Visit (HOSPITAL_BASED_OUTPATIENT_CLINIC_OR_DEPARTMENT_OTHER): Payer: Medicare HMO | Admitting: Hematology and Oncology

## 2017-06-02 ENCOUNTER — Encounter: Payer: Self-pay | Admitting: Hematology and Oncology

## 2017-06-02 DIAGNOSIS — Z170 Estrogen receptor positive status [ER+]: Secondary | ICD-10-CM | POA: Diagnosis not present

## 2017-06-02 DIAGNOSIS — C50511 Malignant neoplasm of lower-outer quadrant of right female breast: Secondary | ICD-10-CM

## 2017-06-02 DIAGNOSIS — C7951 Secondary malignant neoplasm of bone: Secondary | ICD-10-CM | POA: Diagnosis not present

## 2017-06-02 DIAGNOSIS — C50919 Malignant neoplasm of unspecified site of unspecified female breast: Secondary | ICD-10-CM

## 2017-06-02 DIAGNOSIS — Z79811 Long term (current) use of aromatase inhibitors: Secondary | ICD-10-CM | POA: Diagnosis not present

## 2017-06-02 LAB — COMPREHENSIVE METABOLIC PANEL
ALBUMIN: 3.8 g/dL (ref 3.5–5.0)
ALK PHOS: 46 U/L (ref 40–150)
ALT: 15 U/L (ref 0–55)
AST: 25 U/L (ref 5–34)
Anion Gap: 5 mEq/L (ref 3–11)
BILIRUBIN TOTAL: 0.59 mg/dL (ref 0.20–1.20)
BUN: 14.3 mg/dL (ref 7.0–26.0)
CALCIUM: 9.5 mg/dL (ref 8.4–10.4)
CO2: 32 mEq/L — ABNORMAL HIGH (ref 22–29)
Chloride: 102 mEq/L (ref 98–109)
Creatinine: 0.8 mg/dL (ref 0.6–1.1)
EGFR: 79 mL/min/{1.73_m2} — AB (ref >90)
Glucose: 101 mg/dl (ref 70–140)
POTASSIUM: 3.6 meq/L (ref 3.5–5.1)
Sodium: 139 mEq/L (ref 136–145)
Total Protein: 7.5 g/dL (ref 6.4–8.3)

## 2017-06-02 LAB — CBC WITH DIFFERENTIAL/PLATELET
BASO%: 1.1 % (ref 0.0–2.0)
BASOS ABS: 0 10*3/uL (ref 0.0–0.1)
EOS ABS: 0.1 10*3/uL (ref 0.0–0.5)
EOS%: 4.4 % (ref 0.0–7.0)
HCT: 31.6 % — ABNORMAL LOW (ref 34.8–46.6)
HGB: 10.9 g/dL — ABNORMAL LOW (ref 11.6–15.9)
LYMPH%: 43 % (ref 14.0–49.7)
MCH: 34.9 pg — AB (ref 25.1–34.0)
MCHC: 34.5 g/dL (ref 31.5–36.0)
MCV: 101.3 fL — AB (ref 79.5–101.0)
MONO#: 0.2 10*3/uL (ref 0.1–0.9)
MONO%: 8.9 % (ref 0.0–14.0)
NEUT#: 1.2 10*3/uL — ABNORMAL LOW (ref 1.5–6.5)
NEUT%: 42.6 % (ref 38.4–76.8)
NRBC: 0 % (ref 0–0)
PLATELETS: 200 10*3/uL (ref 145–400)
RBC: 3.12 10*6/uL — AB (ref 3.70–5.45)
RDW: 15.8 % — ABNORMAL HIGH (ref 11.2–14.5)
WBC: 2.7 10*3/uL — ABNORMAL LOW (ref 3.9–10.3)
lymph#: 1.2 10*3/uL (ref 0.9–3.3)

## 2017-06-02 NOTE — Progress Notes (Signed)
Patient Care Team: [name], MD as PCP - General (Internal Medicine)  DIAGNOSIS:  Encounter Diagnosis  Name Primary?  . Malignant neoplasm of lower-outer quadrant of right breast of female, estrogen receptor positive ([place])     SUMMARY OF ONCOLOGIC HISTORY:   Breast cancer of lower-outer quadrant of right female breast ([place])   01/04/1995 Surgery    Right breast cancer status post lumpectomy followed by chemotherapy followed by radiation and tamoxifen 5 years (details are not available)      12/30/2015 Relapse/Recurrence    Chest wall/ soft tissue masses near the right side of the sternum, superior nodule measuring 14.5 mm, inferior nodule measuring 10.5 mm, 26 mm anterior mediastinal soft tissue mass, T9 bone metastases      12/30/2015 Procedure    Thoracentesis: 400 mL removed; showed reactive mesothelial cells, no cancer cells      12/30/2015 Imaging    CT chest: 26 x 17 mm anterior mediastinal soft tissue mass, mixed lytic and sclerotic bone mets mainly involving sternum and T9 vertebral body, scattered vertebral body involvement and rib involvement; chest wall soft tissue masses 14.5 mm, 10.5 mm      01/13/2016 Initial Biopsy    Right chest wall mass biopsy 2:30 position: Invasive ductal carcinoma focally involves skeletal muscle, grade 2, HER-2 negative ratio 1.08, ER/PR 95% positive      01/14/2016 PET scan    Widespread metastatic disease right breast, right chest wall, mediastinum, right internal mammary lymph nodes, right pleural space, malignant pleural effusion, right common iliac lymph node, bone metastases in the thorax      01/21/2016 -  Anti-estrogen oral therapy    Ibrance with letrozole      04/15/2017 - 04/23/2017 Hospital Admission    Pneumonia and confusion       CHIEF COMPLIANT: Follow-up on Palbociclib and letrozole  INTERVAL HISTORY: Jessica Zavala is a 71 year old with above-mentioned history metastatic breast cancer currently on Palbociclib and  letrozole. She is tolerating the treatment extremely well. She has recovered very well from the prior hospitalization for pneumonia. She denies any fevers or chills.  REVIEW OF SYSTEMS:   Constitutional: Denies fevers, chills or abnormal weight loss Eyes: Denies blurriness of vision Ears, nose, mouth, throat, and face: Denies mucositis or sore throat Respiratory: Denies cough, dyspnea or wheezes Cardiovascular: Denies palpitation, chest discomfort Gastrointestinal:  Denies nausea, heartburn or change in bowel habits Skin: Denies abnormal skin rashes Lymphatics: Denies new lymphadenopathy or easy bruising Neurological:Denies numbness, tingling or new weaknesses Behavioral/Psych: Mood is stable, no new changes  Extremities: No lower extremity edema Breast:  denies any pain or lumps or nodules in either breasts All other systems were reviewed with the patient and are negative.  I have reviewed the past medical history, past surgical history, social history and family history with the patient and they are unchanged from previous note.  ALLERGIES:  is allergic to aspirin.  MEDICATIONS:  Current Outpatient Prescriptions  Medication Sig Dispense Refill  . atorvastatin (LIPITOR) 20 MG tablet Take 1 tablet (20 mg total) by mouth daily. 30 tablet 11  . clobetasol cream (TEMOVATE) 1.96 % Apply 1 application topically 2 (two) times daily as needed. 30 g 0  . furosemide (LASIX) 20 MG tablet Take 1 tablet (20 mg total) by mouth daily. 30 tablet 0  . labetalol (NORMODYNE) 200 MG tablet Take 1 tablet (200 mg total) by mouth 2 (two) times daily. 30 tablet 1  . letrozole (FEMARA) 2.5 MG tablet Take  1 tablet (2.5 mg total) by mouth daily. 90 tablet 3  . palbociclib (IBRANCE) 75 MG capsule Take 1 capsule (75 mg total) by mouth daily. Take whole with food. 2 weeks on 2 weeks off 14 capsule 6   No current facility-administered medications for this visit.     PHYSICAL EXAMINATION: ECOG PERFORMANCE  STATUS: 1 - Symptomatic but completely ambulatory  Vitals:   06/02/17 1101  BP: 98/75  Pulse: 71  Resp: 16  Temp: 97.5 F (36.4 C)   Filed Weights   06/02/17 1101  Weight: 117 lb (53.1 kg)    GENERAL:alert, no distress and comfortable SKIN: skin color, texture, turgor are normal, no rashes or significant lesions EYES: normal, Conjunctiva are pink and non-injected, sclera clear OROPHARYNX:no exudate, no erythema and lips, buccal mucosa, and tongue normal  NECK: supple, thyroid normal size, non-tender, without nodularity LYMPH:  no palpable lymphadenopathy in the cervical, axillary or inguinal LUNGS: clear to auscultation and percussion with normal breathing effort HEART: regular rate & rhythm and no murmurs and no lower extremity edema ABDOMEN:abdomen soft, non-tender and normal bowel sounds MUSCULOSKELETAL:no cyanosis of digits and no clubbing  NEURO: alert & oriented x 3 with fluent speech, no focal motor/sensory deficits EXTREMITIES: No lower extremity edema BREAST: No palpable masses or nodules in either right or left breasts. No palpable axillary supraclavicular or infraclavicular adenopathy no breast tenderness or nipple discharge. (exam performed in the presence of a chaperone)  LABORATORY DATA:  I have reviewed the data as listed   Chemistry      Component Value Date/Time   NA 139 06/02/2017 1050   K 3.6 06/02/2017 1050   CL 91 (L) 04/23/2017 0539   CO2 32 (H) 06/02/2017 1050   BUN 14.3 06/02/2017 1050   CREATININE 0.8 06/02/2017 1050      Component Value Date/Time   CALCIUM 9.5 06/02/2017 1050   ALKPHOS 46 06/02/2017 1050   AST 25 06/02/2017 1050   ALT 15 06/02/2017 1050   BILITOT 0.59 06/02/2017 1050       Lab Results  Component Value Date   WBC 2.7 (L) 06/02/2017   HGB 10.9 (L) 06/02/2017   HCT 31.6 (L) 06/02/2017   MCV 101.3 (H) 06/02/2017   PLT 200 06/02/2017   NEUTROABS 1.2 (L) 06/02/2017    ASSESSMENT & PLAN:  Breast cancer of lower-outer  quadrant of right female breast ([place]) Metastatic breast cancer PET-CT 01/14/16: Widespread metastatic disease right breast, right chest wall several nodules largest 3.7 cm, mediastinum, right internal mammary lymph node 2.2 cm, right pleural-based nodules 2.1cm and 2.7 cm, malignant pleural effusion, right common iliac lymph node, bone metastases in the thorax (sternum, manubrium, multiple thoracic vertebral bodies, multiple right-sided ribs)  Right chest wall mass biopsy 2:30 position 01/13/2016: Invasive ductal carcinoma focally involves skeletal muscle, grade 2, HER-2 negative ratio 1.08, ER/PR pending (Right breast cancer 1996 status post lumpectomy followed by chemotherapy followed by radiation and tamoxifen 5 years (details are not available)) ---------------------------------------------------------------------------------------------------------------------------------------------------------- Treatment plan: Ibrance with letrozole started 01/20/2016 , cycle 3 Ibrance 100 mg, Cycle 4 Ibrance 75 mg dose; switch to 75 mg 3 weeks on 2 weeks of from 07/22/2016, changed to 2 weeks on 2 weeks off12/12/2015.  Ibrance toxicities: 1. Neutropenia: Today's ANC is 1.2 Currently on 75 mg dose 2weeks on followed by 2 weeks off Denies any hot flashes or myalgias related to letrozole.  Bone metastases: OnXgeva to q 3 months. Patient currently takes calcium with vitamin D Hospitalization: Pneumonia and  Confusion 5/12 and 5/20, patient completely recovered.  Our plan is to obtain scans prior to today's visit but the scans have not yet been done.  Return to clinic in one month for blood work and follow-up to review the scans. After that we can see her once every 3 months.   I spent 25 minutes talking to the patient of which more than half was spent in counseling and coordination of care.  Orders Placed This Encounter  Procedures  . CBC with Differential    Standing Status:   Future    Number  of Occurrences:   1    Standing Expiration Date:   06/02/2018  . CBC with Differential    Standing Status:   Standing    Number of Occurrences:   20    Standing Expiration Date:   06/02/2018  . Comprehensive metabolic panel    Standing Status:   Standing    Number of Occurrences:   20    Standing Expiration Date:   06/02/2018   The patient has a good understanding of the overall plan. she agrees with it. she will call with any problems that may develop before the next visit here.   Rulon Eisenmenger, MD 06/02/17

## 2017-06-02 NOTE — Assessment & Plan Note (Signed)
Metastatic breast cancer PET-CT 01/14/16: Widespread metastatic disease right breast, right chest wall several nodules largest 3.7 cm, mediastinum, right internal mammary lymph node 2.2 cm, right pleural-based nodules 2.1cm and 2.7 cm, malignant pleural effusion, right common iliac lymph node, bone metastases in the thorax (sternum, manubrium, multiple thoracic vertebral bodies, multiple right-sided ribs)  Right chest wall mass biopsy 2:30 position 01/13/2016: Invasive ductal carcinoma focally involves skeletal muscle, grade 2, HER-2 negative ratio 1.08, ER/PR pending (Right breast cancer 1996 status post lumpectomy followed by chemotherapy followed by radiation and tamoxifen 5 years (details are not available)) ---------------------------------------------------------------------------------------------------------------------------------------------------------- Treatment plan: Ibrance with letrozole started 01/20/2016 , cycle 3 Ibrance 100 mg, Cycle 4 Ibrance 75 mg dose; switch to 75 mg 3 weeks on 2 weeks of from 07/22/2016, changed to 2 weeks on 2 weeks off12/12/2015.  Ibrance toxicities: 1. Neutropenia: Today's ANC is  Currently on 75 mg dose 2weeks on followed by 2 weeks off Denies any hot flashes or myalgias related to letrozole.  Bone metastases: OnXgeva to q 4weeks. Patient currently takes calcium with vitamin D Hospitalization: Pneumonia and Confusion 5/12 and 5/20, patient completely recovered.  Our plan is to obtain scans prior to today's visit but the scans have not yet been done.  Return to clinic in one month for blood work and follow-up to review the scans.

## 2017-06-05 ENCOUNTER — Ambulatory Visit: Payer: Medicare HMO | Admitting: Hematology and Oncology

## 2017-06-05 ENCOUNTER — Ambulatory Visit: Payer: Medicare HMO

## 2017-06-05 ENCOUNTER — Other Ambulatory Visit: Payer: Medicare HMO

## 2017-06-06 ENCOUNTER — Encounter: Payer: Self-pay | Admitting: Pharmacist

## 2017-06-06 NOTE — Progress Notes (Signed)
Oral Chemotherapy Pharmacist Encounter  Dispensed samples to patient:  Medication: Ibrance 75mg  capsules Instructions: Take 1 capsule (75mg  total) by mouth once daily, for 2 weeks on and 2 weeks off Quantity dispensed: 21 Days supply: 28 Manufacturer: Pfizer Lot: [id number] Exp: 01/2019  Johny Drilling, PharmD, BCPS, BCOP 06/06/2017 11:53 AM Oral Oncology Clinic [phone]

## 2017-06-08 ENCOUNTER — Telehealth: Payer: Self-pay | Admitting: Pharmacy Technician

## 2017-06-08 NOTE — Telephone Encounter (Signed)
Oral Oncology Patient Advocate Encounter  Was successful in securing patient a $5,400.00 grant from [facility]) to provice copayment coverage for her [facility].  This will keep the out of pocket expense at $0 until the eligibility ends or funds are exhausted.    I have spoken with the patient's son.  He understands and voices appreciation.    The billing information is as follows and has been shared with [facility].   Member ID: [id number] Group ID: [id number] RxBin: [id number] Dates of Eligibility: 09/15/2016 through 12/09/2017  Fabio Asa. Melynda Keller, [place] Oral Oncology Patient Advocate [phone] 06/08/2017 10:30 AM

## 2017-06-09 ENCOUNTER — Ambulatory Visit (HOSPITAL_COMMUNITY)
Admission: RE | Admit: 2017-06-09 | Discharge: 2017-06-09 | Disposition: A | Discharge status: Home / Self Care | Payer: Medicare HMO | Source: Ambulatory Visit | Attending: Hematology and Oncology | Admitting: Hematology and Oncology

## 2017-06-09 ENCOUNTER — Encounter (HOSPITAL_COMMUNITY): Payer: Self-pay

## 2017-06-09 DIAGNOSIS — Z90710 Acquired absence of both cervix and uterus: Secondary | ICD-10-CM | POA: Insufficient documentation

## 2017-06-09 DIAGNOSIS — C7951 Secondary malignant neoplasm of bone: Secondary | ICD-10-CM | POA: Insufficient documentation

## 2017-06-09 DIAGNOSIS — J90 Pleural effusion, not elsewhere classified: Secondary | ICD-10-CM | POA: Diagnosis not present

## 2017-06-09 DIAGNOSIS — C50511 Malignant neoplasm of lower-outer quadrant of right female breast: Secondary | ICD-10-CM | POA: Diagnosis not present

## 2017-06-09 DIAGNOSIS — R109 Unspecified abdominal pain: Secondary | ICD-10-CM | POA: Diagnosis not present

## 2017-06-09 DIAGNOSIS — I700 Atherosclerosis of aorta: Secondary | ICD-10-CM | POA: Diagnosis not present

## 2017-06-09 DIAGNOSIS — Z170 Estrogen receptor positive status [ER+]: Secondary | ICD-10-CM | POA: Insufficient documentation

## 2017-06-09 MED ORDER — IOPAMIDOL (ISOVUE-300) INJECTION 61%
INTRAVENOUS | Status: AC
  Administered 2017-06-09: 100 mL via INTRAVENOUS
  Filled 2017-06-09: qty 100

## 2017-06-09 MED ORDER — IOPAMIDOL (ISOVUE-300) INJECTION 61%
100.0000 mL | Freq: Once | INTRAVENOUS | Status: AC | PRN
  Administered 2017-06-09: 100 mL via INTRAVENOUS

## 2017-06-15 DIAGNOSIS — M859 Disorder of bone density and structure, unspecified: Secondary | ICD-10-CM | POA: Diagnosis not present

## 2017-06-15 DIAGNOSIS — E871 Hypo-osmolality and hyponatremia: Secondary | ICD-10-CM | POA: Diagnosis not present

## 2017-06-26 MED FILL — IBRANCE 75 MG CAPSULE: 75 | 28 days supply | Qty: 14 | Fill #1

## 2017-06-30 ENCOUNTER — Ambulatory Visit: Payer: Medicare HMO | Admitting: Urgent Care

## 2017-06-30 ENCOUNTER — Ambulatory Visit: Payer: Medicare HMO

## 2017-06-30 ENCOUNTER — Ambulatory Visit (HOSPITAL_BASED_OUTPATIENT_CLINIC_OR_DEPARTMENT_OTHER): Payer: Medicare HMO | Admitting: Hematology and Oncology

## 2017-06-30 ENCOUNTER — Encounter: Payer: Self-pay | Admitting: Hematology and Oncology

## 2017-06-30 ENCOUNTER — Other Ambulatory Visit (HOSPITAL_BASED_OUTPATIENT_CLINIC_OR_DEPARTMENT_OTHER): Payer: Medicare HMO

## 2017-06-30 DIAGNOSIS — Z170 Estrogen receptor positive status [ER+]: Secondary | ICD-10-CM | POA: Diagnosis not present

## 2017-06-30 DIAGNOSIS — Z79811 Long term (current) use of aromatase inhibitors: Secondary | ICD-10-CM | POA: Diagnosis not present

## 2017-06-30 DIAGNOSIS — C7951 Secondary malignant neoplasm of bone: Secondary | ICD-10-CM

## 2017-06-30 DIAGNOSIS — C50511 Malignant neoplasm of lower-outer quadrant of right female breast: Secondary | ICD-10-CM

## 2017-06-30 DIAGNOSIS — D701 Agranulocytosis secondary to cancer chemotherapy: Secondary | ICD-10-CM

## 2017-06-30 LAB — COMPREHENSIVE METABOLIC PANEL
ALBUMIN: 3.9 g/dL (ref 3.5–5.0)
ALK PHOS: 45 U/L (ref 40–150)
ALT: 13 U/L (ref 0–55)
AST: 22 U/L (ref 5–34)
Anion Gap: 7 mEq/L (ref 3–11)
BILIRUBIN TOTAL: 0.5 mg/dL (ref 0.20–1.20)
BUN: 20 mg/dL (ref 7.0–26.0)
CO2: 31 mEq/L — ABNORMAL HIGH (ref 22–29)
CREATININE: 0.8 mg/dL (ref 0.6–1.1)
Calcium: 9.6 mg/dL (ref 8.4–10.4)
Chloride: 102 mEq/L (ref 98–109)
EGFR: 79 mL/min/{1.73_m2} — ABNORMAL LOW (ref >90)
GLUCOSE: 99 mg/dL (ref 70–140)
Potassium: 4.4 mEq/L (ref 3.5–5.1)
SODIUM: 140 meq/L (ref 136–145)
TOTAL PROTEIN: 7.6 g/dL (ref 6.4–8.3)

## 2017-06-30 LAB — CBC WITH DIFFERENTIAL/PLATELET
BASO%: 1.4 % (ref 0.0–2.0)
Basophils Absolute: 0 10*3/uL (ref 0.0–0.1)
EOS ABS: 0.2 10*3/uL (ref 0.0–0.5)
EOS%: 5.9 % (ref 0.0–7.0)
HEMATOCRIT: 34.6 % — AB (ref 34.8–46.6)
HEMOGLOBIN: 11.6 g/dL (ref 11.6–15.9)
LYMPH%: 47.2 % (ref 14.0–49.7)
MCH: 34 pg (ref 25.1–34.0)
MCHC: 33.5 g/dL (ref 31.5–36.0)
MCV: 101.5 fL — ABNORMAL HIGH (ref 79.5–101.0)
MONO#: 0.3 10*3/uL (ref 0.1–0.9)
MONO%: 9.1 % (ref 0.0–14.0)
NEUT%: 36.4 % — ABNORMAL LOW (ref 38.4–76.8)
NEUTROS ABS: 1 10*3/uL — AB (ref 1.5–6.5)
Platelets: 223 10*3/uL (ref 145–400)
RBC: 3.41 10*6/uL — ABNORMAL LOW (ref 3.70–5.45)
RDW: 14.9 % — AB (ref 11.2–14.5)
WBC: 2.9 10*3/uL — AB (ref 3.9–10.3)
lymph#: 1.4 10*3/uL (ref 0.9–3.3)

## 2017-06-30 NOTE — Progress Notes (Signed)
Patient Care Team: [name], MD as PCP - General (Internal Medicine)  DIAGNOSIS:  Encounter Diagnosis  Name Primary?  . Malignant neoplasm of lower-outer quadrant of right breast of female, estrogen receptor positive ([place])     SUMMARY OF ONCOLOGIC HISTORY:   Breast cancer of lower-outer quadrant of right female breast ([place])   01/04/1995 Surgery    Right breast cancer status post lumpectomy followed by chemotherapy followed by radiation and tamoxifen 5 years (details are not available)      12/30/2015 Relapse/Recurrence    Chest wall/ soft tissue masses near the right side of the sternum, superior nodule measuring 14.5 mm, inferior nodule measuring 10.5 mm, 26 mm anterior mediastinal soft tissue mass, T9 bone metastases      12/30/2015 Procedure    Thoracentesis: 400 mL removed; showed reactive mesothelial cells, no cancer cells      12/30/2015 Imaging    CT chest: 26 x 17 mm anterior mediastinal soft tissue mass, mixed lytic and sclerotic bone mets mainly involving sternum and T9 vertebral body, scattered vertebral body involvement and rib involvement; chest wall soft tissue masses 14.5 mm, 10.5 mm      01/13/2016 Initial Biopsy    Right chest wall mass biopsy 2:30 position: Invasive ductal carcinoma focally involves skeletal muscle, grade 2, HER-2 negative ratio 1.08, ER/PR 95% positive      01/14/2016 PET scan    Widespread metastatic disease right breast, right chest wall, mediastinum, right internal mammary lymph nodes, right pleural space, malignant pleural effusion, right common iliac lymph node, bone metastases in the thorax      01/21/2016 -  Anti-estrogen oral therapy    Ibrance with letrozole      04/15/2017 - 04/23/2017 Hospital Admission    Pneumonia and confusion       CHIEF COMPLIANT: follow-up on Palbociclib and letrozole and review the results of the scans  INTERVAL HISTORY: Jessica Zavala is a 71 year old with above-mentioned history of metastatic breast  cancer currently on Palbociclib and letrozole. She is now taking it 35 mg 2 weeks on and 2 weeks off. She is tolerating the treatment fairly well. She had recent scans which show stable disease in the bones. She does not report any fatigue nausea vomiting.  REVIEW OF SYSTEMS:   Constitutional: Denies fevers, chills or abnormal weight loss Eyes: Denies blurriness of vision Ears, nose, mouth, throat, and face: Denies mucositis or sore throat Respiratory: Denies cough, dyspnea or wheezes Cardiovascular: Denies palpitation, chest discomfort Gastrointestinal:  Denies nausea, heartburn or change in bowel habits Skin: Denies abnormal skin rashes Lymphatics: Denies new lymphadenopathy or easy bruising Neurological:Denies numbness, tingling or new weaknesses Behavioral/Psych: Mood is stable, no new changes  Extremities: No lower extremity edema Breast:  denies any pain or lumps or nodules in either breasts All other systems were reviewed with the patient and are negative.  I have reviewed the past medical history, past surgical history, social history and family history with the patient and they are unchanged from previous note.  ALLERGIES:  is allergic to aspirin.  MEDICATIONS:  Current Outpatient Prescriptions  Medication Sig Dispense Refill  . atorvastatin (LIPITOR) 20 MG tablet Take 1 tablet (20 mg total) by mouth daily. 30 tablet 11  . clobetasol cream (TEMOVATE) 3.90 % Apply 1 application topically 2 (two) times daily as needed. 30 g 0  . furosemide (LASIX) 20 MG tablet Take 1 tablet (20 mg total) by mouth daily. 30 tablet 0  . labetalol (NORMODYNE) 200 MG  tablet Take 1 tablet (200 mg total) by mouth 2 (two) times daily. 30 tablet 1  . letrozole (FEMARA) 2.5 MG tablet Take 1 tablet (2.5 mg total) by mouth daily. 90 tablet 3  . palbociclib (IBRANCE) 75 MG capsule Take 1 capsule (75 mg total) by mouth daily. Take whole with food. 2 weeks on 2 weeks off 14 capsule 6   No current  facility-administered medications for this visit.     PHYSICAL EXAMINATION: ECOG PERFORMANCE STATUS: 1 - Symptomatic but completely ambulatory  Vitals:   06/30/17 1027  BP: (!) 151/76  Pulse: 69  Resp: 20  Temp: 97.8 F (36.6 C)   Filed Weights   06/30/17 1027  Weight: 117 lb (53.1 kg)    GENERAL:alert, no distress and comfortable SKIN: skin color, texture, turgor are normal, no rashes or significant lesions EYES: normal, Conjunctiva are pink and non-injected, sclera clear OROPHARYNX:no exudate, no erythema and lips, buccal mucosa, and tongue normal  NECK: supple, thyroid normal size, non-tender, without nodularity LYMPH:  no palpable lymphadenopathy in the cervical, axillary or inguinal LUNGS: clear to auscultation and percussion with normal breathing effort HEART: regular rate & rhythm and no murmurs and no lower extremity edema ABDOMEN:abdomen soft, non-tender and normal bowel sounds MUSCULOSKELETAL:no cyanosis of digits and no clubbing  NEURO: alert & oriented x 3 with fluent speech, no focal motor/sensory deficits EXTREMITIES: No lower extremity edema  LABORATORY DATA:  I have reviewed the data as listed   Chemistry      Component Value Date/Time   NA 140 06/30/2017 0944   K 4.4 06/30/2017 0944   CL 91 (L) 04/23/2017 0539   CO2 31 (H) 06/30/2017 0944   BUN 20.0 06/30/2017 0944   CREATININE 0.8 06/30/2017 0944      Component Value Date/Time   CALCIUM 9.6 06/30/2017 0944   ALKPHOS 45 06/30/2017 0944   AST 22 06/30/2017 0944   ALT 13 06/30/2017 0944   BILITOT 0.50 06/30/2017 0944       Lab Results  Component Value Date   WBC 2.9 (L) 06/30/2017   HGB 11.6 06/30/2017   HCT 34.6 (L) 06/30/2017   MCV 101.5 (H) 06/30/2017   PLT 223 06/30/2017   NEUTROABS 1.0 (L) 06/30/2017    ASSESSMENT & PLAN:  Breast cancer of lower-outer quadrant of right female breast ([place]) Metastatic breast cancer PET-CT 01/14/16: Widespread metastatic disease right breast, right  chest wall several nodules largest 3.7 cm, mediastinum, right internal mammary lymph node 2.2 cm, right pleural-based nodules 2.1cm and 2.7 cm, malignant pleural effusion, right common iliac lymph node, bone metastases in the thorax (sternum, manubrium, multiple thoracic vertebral bodies, multiple right-sided ribs)  Right chest wall mass biopsy 2:30 position 01/13/2016: Invasive ductal carcinoma focally involves skeletal muscle, grade 2, HER-2 negative ratio 1.08, ER/PR pending (Right breast cancer 1996 status post lumpectomy followed by chemotherapy followed by radiation and tamoxifen 5 years (details are not available)) ---------------------------------------------------------------------------------------------------------------------------------------------------------- Treatment plan: Ibrance with letrozole started 01/20/2016 , cycle 3 Ibrance 100 mg, Cycle 4 Ibrance 75 mg dose; switch to 75 mg 3 weeks on 2 weeks of from 07/22/2016, changed to 2 weeks on 2 weeks off12/12/2015.  Ibrance toxicities: 1. Neutropenia: Today's ANC is 1.2 Currently on 75 mg dose 2weeks on followed by 2 weeks off Denies any hot flashes or myalgias related to letrozole.  Bone metastases: OnXgeva to q 3 months. Patient currently takes calcium with vitamin D Hospitalization: Pneumonia and Confusion 5/12 and 5/20, patient completely recovered.  CT  chest abdomen pelvis 06/09/2017: Stable disease in bone and right internal mammary lymph node  Return to clinic in 3 months for follow-up and scans every 6 months   I spent 25 minutes talking to the patient of which more than half was spent in counseling and coordination of care.  Orders Placed This Encounter  Procedures  . CBC with Differential    Standing Status:   Future    Standing Expiration Date:   06/30/2018  . Comprehensive metabolic panel    Standing Status:   Future    Standing Expiration Date:   06/30/2018   The patient has a good understanding of  the overall plan. she agrees with it. she will call with any problems that may develop before the next visit here.   Rulon Eisenmenger, MD 06/30/17

## 2017-06-30 NOTE — Assessment & Plan Note (Signed)
Metastatic breast cancer PET-CT 01/14/16: Widespread metastatic disease right breast, right chest wall several nodules largest 3.7 cm, mediastinum, right internal mammary lymph node 2.2 cm, right pleural-based nodules 2.1cm and 2.7 cm, malignant pleural effusion, right common iliac lymph node, bone metastases in the thorax (sternum, manubrium, multiple thoracic vertebral bodies, multiple right-sided ribs)  Right chest wall mass biopsy 2:30 position 01/13/2016: Invasive ductal carcinoma focally involves skeletal muscle, grade 2, HER-2 negative ratio 1.08, ER/PR pending (Right breast cancer 1996 status post lumpectomy followed by chemotherapy followed by radiation and tamoxifen 5 years (details are not available)) ---------------------------------------------------------------------------------------------------------------------------------------------------------- Treatment plan: Ibrance with letrozole started 01/20/2016 , cycle 3 Ibrance 100 mg, Cycle 4 Ibrance 75 mg dose; switch to 75 mg 3 weeks on 2 weeks of from 07/22/2016, changed to 2 weeks on 2 weeks off12/12/2015.  Ibrance toxicities: 1. Neutropenia: Today's ANC is 1.2 Currently on 75 mg dose 2weeks on followed by 2 weeks off Denies any hot flashes or myalgias related to letrozole.  Bone metastases: OnXgeva to q 3 months. Patient currently takes calcium with vitamin D Hospitalization: Pneumonia and Confusion 5/12 and 5/20, patient completely recovered.  CT chest abdomen pelvis 06/09/2017: Stable disease in bone and right internal mammary lymph node  Return to clinic in 3 months for follow-up and scans every 6 months

## 2017-07-01 ENCOUNTER — Encounter: Payer: Self-pay | Admitting: Physician Assistant

## 2017-07-01 ENCOUNTER — Ambulatory Visit (INDEPENDENT_AMBULATORY_CARE_PROVIDER_SITE_OTHER): Payer: Medicare HMO | Admitting: Physician Assistant

## 2017-07-01 VITALS — BP 122/67 | HR 70 | Temp 97.7°F | Resp 17 | Ht <= 58 in | Wt 117.1 lb

## 2017-07-01 DIAGNOSIS — C50919 Malignant neoplasm of unspecified site of unspecified female breast: Secondary | ICD-10-CM | POA: Diagnosis not present

## 2017-07-01 DIAGNOSIS — L309 Dermatitis, unspecified: Secondary | ICD-10-CM

## 2017-07-01 DIAGNOSIS — C7951 Secondary malignant neoplasm of bone: Secondary | ICD-10-CM

## 2017-07-01 MED ORDER — CLOTRIMAZOLE-BETAMETHASONE 1-0.05 % EX CREA: 1.0000 "application " | TOPICAL_CREAM | Freq: Two times a day (BID) | CUTANEOUS | 0 refills | Status: DC

## 2017-07-01 NOTE — Progress Notes (Signed)
Patient ID: Jessica Zavala, female    DOB: 03/07/1946, 71 y.o.   MRN: [id number]  PCP: [name], MD  Chief Complaint  Patient presents with  . Rash    on both arms & hands x 2 weeks. Itches    Subjective:   Presents for evaluation of itchy rash on the upper arms bilaterally x 2 weeks. She is accompanied by her husband who translate, yet there remains a language barrier.  She has chronic/recurrent eczema of the hands and forearms, which is reportedly unchanged. No lip, tongue or throat symptoms. No other new areas of rash. Uses topical products intermittently, but relates that she sometimes needs steroids, either IM or oral. No new products, foods, medications. Clobetasol 0.05% and triamcinolone 0.1% have recently been ineffective.  She is on treatment for metastatic breast cancer, and saw oncology yesterday. Recent scans show widespread but stable bony mets. Husband reports that Dr. Lindi Adie suggested that letrozole may be the cause, but this issue was not addressed in documentation of that visit. Initially, I understood him to say that Lotrisone had been recommended, and when I offered to prescribe it, the patient thought I was telling her to change the letrozole dose.  We reviewed her medication list, and reconciled outside medications to the best of our ability. There is some question if the atorvastatin dose is 20 mg or 40 mg, but as her lipids are not being addressed today, will leave that to her PCP.   Review of Systems No CP, SOB, HA, dizziness.     Patient Active Problem List   Diagnosis Date Noted  . Metastatic breast cancer ([place])   . Bone metastases ([place]) 02/02/2016  . Breast cancer of lower-outer quadrant of right female breast ([place]) 01/05/2016  . Eczema of hand 11/15/2013  . Hypercholesterolemia 06/14/2012  . Hypertension 01/06/2012  . Eczema 01/06/2012     Prior to Admission medications   Medication Sig Start Date End Date Taking? Authorizing Provider   atorvastatin (LIPITOR) 20 MG tablet Take 1 tablet (20 mg total) by mouth daily. 04/23/17 04/23/18 Yes Doreatha Lew, MD  clobetasol cream (TEMOVATE) 8.46 % Apply 1 application topically 2 (two) times daily as needed. 03/03/17  Yes McVey, Gelene Mink, PA-C  furosemide (LASIX) 20 MG tablet Take 1 tablet (20 mg total) by mouth daily. 04/24/17  Yes Patrecia Pour, Christean Grief, MD  labetalol (NORMODYNE) 200 MG tablet Take 1 tablet (200 mg total) by mouth 2 (two) times daily. 03/27/17  Yes McVey, Gelene Mink, PA-C  letrozole [facility]) 2.5 MG tablet Take 1 tablet (2.5 mg total) by mouth daily. 02/06/17  Yes [name], MD  palbociclib [facility]) 75 MG capsule Take 1 capsule (75 mg total) by mouth daily. Take whole with food. 2 weeks on 2 weeks off 05/08/17  Yes [name], MD     Allergies  Allergen Reactions  . Aspirin Swelling    Mouth and facial swelling       Objective:  Physical Exam  Constitutional: She is oriented to person, place, and time. She appears well-developed and well-nourished. She is active and cooperative. No distress.  BP 122/67   Pulse 70   Temp 97.7 F (36.5 C) (Oral)   Resp 17   Ht 4\' 10"  (1.473 m)   Wt 117 lb 2 oz (53.1 kg)   SpO2 95%   BMI 24.48 kg/m    Eyes: Conjunctivae are normal.  Pulmonary/Chest: Effort normal.  Neurological: She is alert and oriented to  person, place, and time.  Skin: Skin is warm and dry. Lesion and rash noted. She is not diaphoretic.  Dorsal aspect of hands and forearms with papular-nodular lesions in various stages of resolution, generally appearing chronic, without evidence of cellulitis.  Scattered small excoriations on lower aspect of both upper arms, also without cellulitis.  Psychiatric: She has a normal mood and affect. Her speech is normal and behavior is normal.      Assessment & Plan:   Problem List Items Addressed This Visit    Eczema - Primary   Bone metastases ([place])   Metastatic breast cancer ([place])     Known  eczema, though this is new, and appears reactive. No improvement with topical steroids so far. Trial of Lotrisone. Elected against systemic steroids, despite her request, but advised that if she is still no better in 2 weeks, that should be considered. Defer to oncology regarding the possibility that this represents adverse reaction from letrozole. Encourage follow-up with her PCP, Dr. Virgina Jock, though we are certainly happy to see her as needed when he is not available.  Return if symptoms worsen or fail to improve.   Fara Chute, PA-C [facility] at [place]

## 2017-07-01 NOTE — Patient Instructions (Addendum)
Try the Lotrisone to the new rash. Try to stay cool and dry. Be sure to moisturize the skin regularly and drink plenty of water.    IF you received an x-ray today, you will receive an invoice from [facility]. Please contact [facility] Radiology at [phone] with questions or concerns regarding your invoice.   IF you received labwork today, you will receive an invoice from [facility]. Please contact [facility] at [phone] with questions or concerns regarding your invoice.   Our billing staff will not be able to assist you with questions regarding bills from these companies.  You will be contacted with the lab results as soon as they are available. The fastest way to get your results is to activate your My Chart account. Instructions are located on the last page of this paperwork. If you have not heard from [place] regarding the results in 2 weeks, please contact this office.

## 2017-07-04 ENCOUNTER — Ambulatory Visit: Payer: Medicare HMO | Admitting: Physician Assistant

## 2017-07-11 ENCOUNTER — Ambulatory Visit (INDEPENDENT_AMBULATORY_CARE_PROVIDER_SITE_OTHER): Payer: Medicare HMO | Admitting: Physician Assistant

## 2017-07-11 ENCOUNTER — Encounter: Payer: Self-pay | Admitting: Physician Assistant

## 2017-07-11 VITALS — BP 130/90 | HR 71 | Temp 98.3°F | Resp 16 | Ht <= 58 in | Wt 118.8 lb

## 2017-07-11 DIAGNOSIS — R21 Rash and other nonspecific skin eruption: Secondary | ICD-10-CM | POA: Diagnosis not present

## 2017-07-11 DIAGNOSIS — L309 Dermatitis, unspecified: Secondary | ICD-10-CM | POA: Diagnosis not present

## 2017-07-11 MED ORDER — FLURANDRENOLIDE 4 MCG/SQCM EX TAPE: 1.0000 | MEDICATED_TAPE | Freq: Every evening | CUTANEOUS | 1 refills | Status: DC | PRN

## 2017-07-11 MED ORDER — CLOTRIMAZOLE-BETAMETHASONE 1-0.05 % EX CREA: 1.0000 "application " | TOPICAL_CREAM | Freq: Two times a day (BID) | CUTANEOUS | 1 refills | Status: DC

## 2017-07-11 MED ORDER — METHYLPREDNISOLONE ACETATE 80 MG/ML IJ SUSP
80.0000 mg | Freq: Once | INTRAMUSCULAR | Status: AC
  Administered 2017-07-11: 80 mg via INTRAMUSCULAR

## 2017-07-11 NOTE — Progress Notes (Signed)
Jessica Zavala  MRN: [id number] DOB: December 18, 1945  PCP: [name], MD  Subjective:  Pt is a 71 year old female PMH breast cancer who presents to clinic for hand pain x 3 months.   She has presented to PCP several times for this c/c. She uses Clobetasol 0.05% and triamcinolone 0.1% - helping.  Today she c/o pain and itching.   She was here 7/28 for the same. Plan: "Known eczema, though this is new, and appears reactive. No improvement with topical steroids so far. Trial of Lotrisone. Elected against systemic steroids, despite her request, but advised that if she is still no better in 2 weeks, that should be considered. Defer to oncology regarding the possibility that this represents adverse reaction from letrozole. Encourage follow-up with her PCP, Dr. Virgina Jock, though we are certainly happy to see her as needed when he is not available."   States her oncologist was unsure what her rash was and was unlikely due to Femara or Ibrance. She has f/u with cancer doctor in 3 months. "Cancer doctor say she is good". She is taking anti-estrogen oral therapy: Ibrace with letrozole. She has seen a dermatologist in the past for her rash. About 5 years ago.   Review of Systems  Constitutional: Negative for chills, fatigue and fever.  Musculoskeletal: Negative for arthralgias, joint swelling and myalgias.  Skin: Positive for rash.  Psychiatric/Behavioral: Positive for sleep disturbance.    Patient Active Problem List   Diagnosis Date Noted  . Metastatic breast cancer ([place])   . Bone metastases ([place]) 02/02/2016  . Breast cancer of lower-outer quadrant of right female breast ([place]) 01/05/2016  . Hypercholesterolemia 06/14/2012  . Hypertension 01/06/2012  . Eczema 01/06/2012    Current Outpatient Prescriptions on File Prior to Visit  Medication Sig Dispense Refill  . atorvastatin (LIPITOR) 20 MG tablet Take 1 tablet (20 mg total) by mouth daily. 30 tablet 11  . clotrimazole-betamethasone (LOTRISONE)  cream Apply 1 application topically 2 (two) times daily. 30 g 0  . furosemide (LASIX) 20 MG tablet Take 1 tablet (20 mg total) by mouth daily. 30 tablet 0  . labetalol (NORMODYNE) 200 MG tablet Take 1 tablet (200 mg total) by mouth 2 (two) times daily. 30 tablet 1  . letrozole (FEMARA) 2.5 MG tablet Take 1 tablet (2.5 mg total) by mouth daily. 90 tablet 3  . palbociclib (IBRANCE) 75 MG capsule Take 1 capsule (75 mg total) by mouth daily. Take whole with food. 2 weeks on 2 weeks off 14 capsule 6  . clobetasol cream (TEMOVATE) 0.62 % Apply 1 application topically 2 (two) times daily as needed. (Patient not taking: Reported on 07/11/2017) 30 g 0   No current facility-administered medications on file prior to visit.     Allergies  Allergen Reactions  . Aspirin Swelling    Mouth and facial swelling     Objective:  BP 130/90 (BP Location: Left Arm, Cuff Size: Normal)   Pulse 71   Temp 98.3 F (36.8 C) (Oral)   Resp 16   Ht 4\' 9"  (1.448 m)   Wt 118 lb 12.8 oz (53.9 kg)   SpO2 98%   BMI 25.71 kg/m   Physical Exam  Constitutional: She is oriented to person, place, and time and well-developed, well-nourished, and in no distress.  Neurological: She is alert and oriented to person, place, and time.  Skin: Skin is warm and dry.  excoriated, nodular lesions often symmetrically distributed    Psychiatric: Mood, memory, affect  and judgment normal.    Assessment and Plan :  1. Eczema, unspecified type 2. Rash and nonspecific skin eruption - methylPREDNISolone acetate (DEPO-MEDROL) injection 80 mg; Inject 1 mL (80 mg total) into the muscle once. - clotrimazole-betamethasone (LOTRISONE) cream; Apply 1 application topically 2 (two) times daily.  Dispense: 45 g; Refill: 1 - Flurandrenolide 4 MCG/SQCM TAPE; Apply 1 each topically at bedtime as needed and may repeat dose one time if needed.  Dispense: 1 each; Refill: 1 - Pt has been seen multiple times for this c/c. Oncologist does not believe this  is a medication side effect. Suspect possible prurigo nodularis. She has not been evaluated by dermatology in > 5 years. If she return with same c/c, consider biopsy and/or derm referral, and lab work: HIV, UA, Stool ova and parasites, chest x-ray.   Mercer Pod, PA-C  [facility] at [place] 07/11/2017 5:19 PM

## 2017-07-11 NOTE — Patient Instructions (Addendum)
Flurandrenolide Tape: Apply 1 to 2 times per day. Tape: Apply to clean, dry skin (allow skin to dry 1 hour before applying new tape). Do not tear tape; always cut. Replacement of tape every 12 hours is best tolerated, but may be left in place for 24 hours if well tolerated. If necessary, may be used just at night and removed during the day. Therapy should be discontinued when control is achieved. Do not use more than 2 weeks.  This will help prevent itching. Your itching is making your rash not get better.    Thank you for coming in today. I hope you feel we met your needs.  Feel free to call PCP if you have any questions or further requests.  Please consider signing up for MyChart if you do not already have it, as this is a great way to communicate with me.  Best,  Whitney McVey, PA-C  IF you received an x-ray today, you will receive an invoice from [facility] Radiology. Please contact [facility] Radiology at [phone] with questions or concerns regarding your invoice.   IF you received labwork today, you will receive an invoice from [place]. Please contact [facility] at [phone] with questions or concerns regarding your invoice.   Our billing staff will not be able to assist you with questions regarding bills from these companies.  You will be contacted with the lab results as soon as they are available. The fastest way to get your results is to activate your My Chart account. Instructions are located on the last page of this paperwork. If you have not heard from [place] regarding the results in 2 weeks, please contact this office.

## 2017-07-15 ENCOUNTER — Telehealth: Payer: Self-pay

## 2017-07-15 NOTE — Telephone Encounter (Signed)
octorber appts have been r/;s due to dr Lindi Adie out of the office,a letter and new calendars have been mailed

## 2017-07-20 MED FILL — IBRANCE 75 MG CAPSULE: 75 | 28 days supply | Qty: 14 | Fill #2

## 2017-07-24 ENCOUNTER — Ambulatory Visit: Payer: Medicare HMO | Admitting: Physician Assistant

## 2017-08-08 ENCOUNTER — Ambulatory Visit: Payer: Medicare HMO

## 2017-08-10 MED FILL — IBRANCE 75 MG CAPSULE: 75 | 28 days supply | Qty: 14 | Fill #3

## 2017-08-31 MED FILL — IBRANCE 75 MG CAPSULE: 75 | 28 days supply | Qty: 14 | Fill #4

## 2017-09-22 MED FILL — IBRANCE 75 MG CAPSULE: 75 | 28 days supply | Qty: 14 | Fill #5

## 2017-09-28 ENCOUNTER — Other Ambulatory Visit (HOSPITAL_BASED_OUTPATIENT_CLINIC_OR_DEPARTMENT_OTHER): Payer: Medicare HMO

## 2017-09-28 ENCOUNTER — Other Ambulatory Visit: Payer: Self-pay

## 2017-09-28 ENCOUNTER — Ambulatory Visit (HOSPITAL_BASED_OUTPATIENT_CLINIC_OR_DEPARTMENT_OTHER): Payer: Medicare HMO

## 2017-09-28 ENCOUNTER — Ambulatory Visit (HOSPITAL_BASED_OUTPATIENT_CLINIC_OR_DEPARTMENT_OTHER): Payer: Medicare HMO | Admitting: Hematology and Oncology

## 2017-09-28 ENCOUNTER — Telehealth: Payer: Self-pay | Admitting: Hematology and Oncology

## 2017-09-28 VITALS — BP 129/65 | HR 65 | Temp 98.4°F | Resp 18 | Ht <= 58 in | Wt 113.8 lb

## 2017-09-28 DIAGNOSIS — C50511 Malignant neoplasm of lower-outer quadrant of right female breast: Secondary | ICD-10-CM

## 2017-09-28 DIAGNOSIS — Z79811 Long term (current) use of aromatase inhibitors: Secondary | ICD-10-CM | POA: Diagnosis not present

## 2017-09-28 DIAGNOSIS — C50919 Malignant neoplasm of unspecified site of unspecified female breast: Secondary | ICD-10-CM

## 2017-09-28 DIAGNOSIS — C7951 Secondary malignant neoplasm of bone: Secondary | ICD-10-CM

## 2017-09-28 DIAGNOSIS — Z170 Estrogen receptor positive status [ER+]: Secondary | ICD-10-CM | POA: Diagnosis not present

## 2017-09-28 DIAGNOSIS — Z23 Encounter for immunization: Secondary | ICD-10-CM | POA: Diagnosis not present

## 2017-09-28 LAB — CBC WITH DIFFERENTIAL/PLATELET
BASO%: 2 % (ref 0.0–2.0)
BASOS ABS: 0 10*3/uL (ref 0.0–0.1)
EOS%: 7.7 % — AB (ref 0.0–7.0)
Eosinophils Absolute: 0.2 10*3/uL (ref 0.0–0.5)
HCT: 35.3 % (ref 34.8–46.6)
HGB: 12 g/dL (ref 11.6–15.9)
LYMPH%: 41.1 % (ref 14.0–49.7)
MCH: 33.8 pg (ref 25.1–34.0)
MCHC: 34.1 g/dL (ref 31.5–36.0)
MCV: 99.4 fL (ref 79.5–101.0)
MONO#: 0.4 10*3/uL (ref 0.1–0.9)
MONO%: 17.7 % — AB (ref 0.0–14.0)
NEUT#: 0.8 10*3/uL — ABNORMAL LOW (ref 1.5–6.5)
NEUT%: 31.5 % — AB (ref 38.4–76.8)
Platelets: 234 10*3/uL (ref 145–400)
RBC: 3.55 10*6/uL — ABNORMAL LOW (ref 3.70–5.45)
RDW: 16.4 % — AB (ref 11.2–14.5)
WBC: 2.4 10*3/uL — ABNORMAL LOW (ref 3.9–10.3)
lymph#: 1 10*3/uL (ref 0.9–3.3)

## 2017-09-28 LAB — COMPREHENSIVE METABOLIC PANEL
ALBUMIN: 3.7 g/dL (ref 3.5–5.0)
ALT: 16 U/L (ref 0–55)
ANION GAP: 8 meq/L (ref 3–11)
AST: 24 U/L (ref 5–34)
Alkaline Phosphatase: 50 U/L (ref 40–150)
BILIRUBIN TOTAL: 0.59 mg/dL (ref 0.20–1.20)
BUN: 15.4 mg/dL (ref 7.0–26.0)
CO2: 32 meq/L — AB (ref 22–29)
CREATININE: 0.8 mg/dL (ref 0.6–1.1)
Calcium: 9.6 mg/dL (ref 8.4–10.4)
Chloride: 99 mEq/L (ref 98–109)
EGFR: >60 mL/min/{1.73_m2} (ref >60)
Glucose: 94 mg/dl (ref 70–140)
Potassium: 4.8 mEq/L (ref 3.5–5.1)
SODIUM: 140 meq/L (ref 136–145)
TOTAL PROTEIN: 7.7 g/dL (ref 6.4–8.3)

## 2017-09-28 MED ORDER — INFLUENZA VAC SPLIT HIGH-DOSE 0.5 ML IM SUSY
0.5000 mL | PREFILLED_SYRINGE | INTRAMUSCULAR | Status: DC
  Filled 2017-09-28: qty 0.5

## 2017-09-28 MED ORDER — DENOSUMAB 120 MG/1.7ML ~~LOC~~ SOLN
120.0000 mg | Freq: Once | SUBCUTANEOUS | Status: AC
  Administered 2017-09-28: 120 mg via SUBCUTANEOUS
  Filled 2017-09-28: qty 1.7

## 2017-09-28 MED ORDER — INFLUENZA VAC SPLIT HIGH-DOSE 0.5 ML IM SUSY
0.5000 mL | PREFILLED_SYRINGE | INTRAMUSCULAR | Status: AC
  Administered 2017-09-28: 0.5 mL via INTRAMUSCULAR
  Filled 2017-09-28: qty 0.5

## 2017-09-28 NOTE — Telephone Encounter (Signed)
Gave patient AVS and calendar of upcoming January appointments °

## 2017-09-28 NOTE — Assessment & Plan Note (Signed)
Metastatic breast cancer PET-CT 01/14/16: Widespread metastatic disease right breast, right chest wall several nodules largest 3.7 cm, mediastinum, right internal mammary lymph node 2.2 cm, right pleural-based nodules 2.1cm and 2.7 cm, malignant pleural effusion, right common iliac lymph node, bone metastases in the thorax (sternum, manubrium, multiple thoracic vertebral bodies, multiple right-sided ribs)  Right chest wall mass biopsy 2:30 position 01/13/2016: Invasive ductal carcinoma focally involves skeletal muscle, grade 2, HER-2 negative ratio 1.08, ER/PR pending (Right breast cancer 1996 status post lumpectomy followed by chemotherapy followed by radiation and tamoxifen 5 years (details are not available)) ---------------------------------------------------------------------------------------------------------------------------------------------------------- Treatment plan: Ibrance with letrozole started 01/20/2016 , cycle 3 Ibrance 100 mg, Cycle 4 Ibrance 75 mg dose; switch to 75 mg 3 weeks on 2 weeks of from 07/22/2016, changed to 2 weeks on 2 weeks off12/12/2015.  Ibrance toxicities: 1. Neutropenia: Today's ANC is 1.2 Currently on 75 mg dose 2weeks on followed by 2 weeks off Denies any hot flashes or myalgias related to letrozole.  Bone metastases: OnXgeva to q 3 months. Patient currently takes calcium with vitamin D Hospitalization: Pneumonia and Confusion 5/12 and 5/20, patient completely recovered.  CT chest abdomen pelvis 06/09/2017: Stable disease in bone and right internal mammary lymph node  Return to clinic in 3 months with scans and follow-up

## 2017-09-28 NOTE — Patient Instructions (Signed)
Influenza Virus Vaccine injection What is this medicine? INFLUENZA VIRUS VACCINE (in floo EN zuh VAHY ruhs vak SEEN) helps to reduce the risk of getting influenza also known as the flu. The vaccine only helps protect you against some strains of the flu. This medicine may be used for other purposes; ask your health care provider or pharmacist if you have questions. COMMON BRAND NAME(S): Afluria, Agriflu, Alfuria, FLUAD, Fluarix, Fluarix Quadrivalent, Flublok, Flublok Quadrivalent, FLUCELVAX, Flulaval, Fluvirin, Fluzone, Fluzone High-Dose, Fluzone Intradermal What should I tell my health care provider before I take this medicine? They need to know if you have any of these conditions: -bleeding disorder like hemophilia -fever or infection -Guillain-Barre syndrome or other neurological problems -immune system problems -infection with the human immunodeficiency virus (HIV) or AIDS -low blood platelet counts -multiple sclerosis -an unusual or allergic reaction to influenza virus vaccine, latex, other medicines, foods, dyes, or preservatives. Different brands of vaccines contain different allergens. Some may contain latex or eggs. Talk to your doctor about your allergies to make sure that you get the right vaccine. -pregnant or trying to get pregnant -breast-feeding How should I use this medicine? This vaccine is for injection into a muscle or under the skin. It is given by a health care professional. A copy of Vaccine Information Statements will be given before each vaccination. Read this sheet carefully each time. The sheet may change frequently. Talk to your healthcare provider to see which vaccines are right for you. Some vaccines should not be used in all age groups. Overdosage: If you think you have taken too much of this medicine contact a poison control center or emergency room at once. NOTE: This medicine is only for you. Do not share this medicine with others. What if I miss a dose? This  does not apply. What may interact with this medicine? -chemotherapy or radiation therapy -medicines that lower your immune system like etanercept, anakinra, infliximab, and adalimumab -medicines that treat or prevent blood clots like warfarin -phenytoin -steroid medicines like prednisone or cortisone -theophylline -vaccines This list may not describe all possible interactions. Give your health care provider a list of all the medicines, herbs, non-prescription drugs, or dietary supplements you use. Also tell them if you smoke, drink alcohol, or use illegal drugs. Some items may interact with your medicine. What should I watch for while using this medicine? Report any side effects that do not go away within 3 days to your doctor or health care professional. Call your health care provider if any unusual symptoms occur within 6 weeks of receiving this vaccine. You may still catch the flu, but the illness is not usually as bad. You cannot get the flu from the vaccine. The vaccine will not protect against colds or other illnesses that may cause fever. The vaccine is needed every year. What side effects may I notice from receiving this medicine? Side effects that you should report to your doctor or health care professional as soon as possible: -allergic reactions like skin rash, itching or hives, swelling of the face, lips, or tongue Side effects that usually do not require medical attention (report to your doctor or health care professional if they continue or are bothersome): -fever -headache -muscle aches and pains -pain, tenderness, redness, or swelling at the injection site -tiredness This list may not describe all possible side effects. Call your doctor for medical advice about side effects. You may report side effects to FDA at 1-800-FDA-1088. Where should I keep my medicine? The vaccine will   be given by a health care professional in a clinic, pharmacy, doctor's office, or other health care  setting. You will not be given vaccine doses to store at home. NOTE: This sheet is a summary. It may not cover all possible information. If you have questions about this medicine, talk to your doctor, pharmacist, or health care provider.  2018 Elsevier/Gold Standard (2015-06-12 10:07:28) Denosumab injection ?y l thu?c g? DENOSUMAB lm ch?m s? suy y?u x??ng. Prolia ???c dng ?? ?i?u tr? ch?ng long x??ng ? ph? n? sau khi mn kinh v ? nam gi?i. Xgeva ???c dng ?? phng ng?a gy x??ng v cc v?n ?? khc v? x??ng gy ra b?i di c?n x??ng do ung th?[place] See c?ng ???c dng ?? ?i?u tr? b??u t? bo kh?ng l? (giant cell tumor) c?a x??ng. Thu?c ny c th? ???c dng cho nh?ng m?c ?ch khc; hy h?i ng??i cung c?p d?ch v? y t? ho?c d??c s? c?a mnh, n?u qu v? c th?c m?c. Ti c?n ph?i bo cho ng??i cung c?p d?ch v? y t? c?a mnh ?i?u g tr??c khi dng thu?c ny? H? c?n bi?t li?u qu v? c b?t k? tnh tr?ng no sau ?y khng: -b?nh v? r?ng -chm da -nhi?m trng ho?c ti?n s? nhi?m trng -b?nh th?n ho?c n?u qu v? ?ang ???c th?m phn -m?c calci ho?c vitamin D trong mu th?p -h?i ch?ng r?i lo?n h?p th? -???c x?p vo l?ch ?? gi?i ph?u ho?c nh? r?ng -?ang du?ng thu?c co? ch??a denosumab -b?nh tuy?n gip tr?ng ho?c tuy?n c?n gip tr?ng -ph?n ?ng b?t th??ng v?i denosumab -pha?n ??ng b?t th???ng ho??c di? ??ng v??i ca?c d??c ph?m kha?c -pha?n ??ng b?t th???ng ho??c di? ??ng v??i th??c ph?m, thu?c nhu?m, ho??c ch?t ba?o qua?n -?ang c thai ho??c ??nh co? thai -?ang cho con bu? Ti nn s? d?ng thu?c ny nh? th? no? Thu?c ny ?? tim d??i da. Thu?c ny ???c s? d?ng b?i chuyn vin y t? ? b?nh vi?n ho?c ? phng m?ch. N?u qu v? s?p dng thu?c Prolia, d??c s? s? ??a cho qu v? m?t B?n H??ng D?n v? D??c Ph?m (MedGuide) ??c bi?t cho m?i toa thu?c v cho m?i l?n mua thm thu?c ?. Hy b?o ??m ??c k? thng tin ny m?i l?n. ??i v?i Prolia, hy bn v?i bc s? nhi khoa c?a qu v? v? vi?c dng thu?c ny ? tr?  em. C th? c?n ch?m [place] ??c bi?t. ??i v?i Xgeva, hy bn v?i bc s? nhi khoa c?a qu v? v? vi?c dng thu?c ny ? tr? em. Thu?c ny c th? ???c k toa cho tr? em ch? m?i 13 tu?i trong nh?ng tr??ng h?p ch?n l?c, nh?ng c?n ph?i th?n tr?ng. Qu li?u: N?u qu v? cho r?ng mnh ? dng qu nhi?u thu?c ny, th hy lin l?c v?i trung tm ki?m sot ch?t ??c ho?c phng c?p c?u ngay l?p t?c. L?U : Thu?c ny ch? dnh ring cho qu v?. Khng chia s? thu?c ny v?i nh?ng ng??i khc. N?u ti l? qun m?t li?u th sao? ?i?u quan tr?ng l khng nn b? l? li?u thu?c no. Hy lin l?c v?i bc s? ho?c [place] vin y t? c?a mnh, n?u qu v? khng th? gi? ?ng cu?c h?n khm. Nh?ng g c th? t??ng tc v?i thu?c ny? Khng ???c dng thu?c ny cng v?i b?t k? th? no sau ?y: -nh??ng thu?c kha?c co? ch??a denosumab Thu?c ny c?ng c th? t??ng tc v?i cc thu?c sau ?y: -cc thu?c ?  c ch? h? mi?n d?ch -cc thu?c ?i?u tr? b?nh ung th? -cc thu?c steroid, ch?ng h?n nh? prednisone ho?c cortisone [facility] sch ny c th? khng m t? ?? h?t cc t??ng tc c th? x?y ra. Hy ??a cho ng??i cung c?p d?ch v? y t? c?a mnh danh sch t?t c? cc thu?c, th?o d??c, cc thu?c khng c?n toa, ho?c cc ch? ph?m b? sung m qu v? dng. C?ng nn bo cho h? bi?t r?ng qu v? c ht thu?c, u?ng r??u, ho?c c s? d?ng ma ty tri php hay khng. Vi th? c th? t??ng tc v?i thu?c c?a qu v?. Ti c?n ph?i theo di ?i?u g trong khi dng thu?c ny? Hy ?i g?p bc s? ho?c chuyn vin y t? ?? theo di ??nh k? s? c?i thi?n c?a qu v?. Bc s? c th? yu c?u th? mu v lm cc xt nghi?m khc ?? theo di s? c?i thi?n c?a qu v?. Hy lin l?c v?i bc s? ho?c chuyn vin y t?, n?u qu v? b? c?m l?nh ho?c b? b?nh nhi?m trng khc trong khi dng thu?c ny. Khng ???c t? ?i?u tr? cho mnh. Thu?c ny c th? lm gi?m kh? n?ng ch?ng l?i cc b?nh nhi?m trng c?a c? th?. Qu v? c?n ph?i ch?c ch?n r?ng c? th? qu v? nh?n ???c ??y ?? l??ng calci v vitamin D trong th?i gian  dng thu?c ny, tr? khi bc s? yu c?u qu v? ??ng lm nh? v?y. Hy th?o lu?n v?i bc s? ho?c chuyn vin y t? v? nh?ng th?c ph?m qu v? ?n v nh?ng vitamin qu v? dng. Hy ?i nha s? th??ng xuyn. ?nh r?ng ho?c x?a r?ng b?ng ch? nha khoa nh? ? ???c ch? d?n. N?u qu v? c ?i lm r?ng, th hy bo v?i nha s? r?ng qu v? ?ang dng thu?c ny. Khng ???c c thai trong th?i gian dng thu?c ny ho?c trong vng 5 thng sau khi ng?ng dng thu?c. Ph? n? c?n ph?i thng bo cho bc s? c?a mnh, n?u mu?n c thai ho?c ngh? r?ng c th? mnh ? c [place]. C nguy c? v? cc tc d?ng ph? nghim tr?ng ??i v?i [place] nhi. Hy th?o lu?n v?i bc s? ho?c chuyn vin y t? ho?c d??c s? ?? bi?t thm thng tin. Ti c th? nh?n th?y nh?ng tc d?ng ph? no khi dng thu?c ny? Nh?ng tc d?ng ph? qu v? c?n ph?i bo cho bc s? ho?c chuyn vin y t? cng s?m cng t?t: -cc ph?n ?ng d? ?ng, ch?ng h?n nh? da b? m?n ??, ng?a, n?i my ?ay, s?ng ? m?t, mi, ho?c l??i -cc v?n ?? v? h h?p -?au ng?c -tim ??p nhanh ho?c khng ??u -c?m th?y chong vng, ng?t x?u, b? t -s?t, ?n l?nh, ho?c b?t k? d?u hi?u nhi?m trng no khc -co th?t, c?ng, ho?c gi?t c? b?p -t ho?c c?m gic nh? b? ki?n b -da b? r?p ho?c c u c?c, ho?c da b? kh, bong trc ho?c ?? -v?t th??ng lu lnh ho?c ?au khng r nguyn nhn ? mi?ng ho?c hm -b? b?m tm ho?c xu?t huy?t b?t th??ng Cc tc d?ng ph? khng c?n ph?i ch?m [place] y t? (Hy bo cho bc s? ho?c chuyn vin y t? n?u cc tc d?ng ph? ny ti?p di?n ho?c gy phi?n toi): -?au c? b?p -kh ch?u bao t?, ??y h?i [facility] sch ny c th? khng m t? ?? h?t cc tc d?ng ph? c th? x?y ra. Tawni Pummel  g?i t?i bc s? c?a mnh ?? ???c c? v?n chuyn mn v? cc tc d?ng ph?Sander Nephew v? c th? t??ng trnh cc tc d?ng ph? cho FDA theo s? [phone]. Ti nn c?t gi? thu?c c?a mnh ? ?u? Thu?c ny ch? ???c dng trong phng m?ch, v?n phng bc s? ho?c c? s? y t? khc v s? khng ???c c?t gi? t?i nh. L?U : ?y l b?n tm t?t. N c th?  khng bao hm t?t c? thng tin c th? c. N?u qu v? th?c m?c v? thu?c ny, xin trao ??i v?i bc s?, d??c s?, ho?c ng??i cung c?p d?ch v? y t? c?a mnh.    2016, Elsevier/Gold Standard. (2012-07-05 00:00:00)

## 2017-09-28 NOTE — Progress Notes (Signed)
Patient Care Team: [name], MD as PCP - General (Internal Medicine) [name], MD as Consulting Physician (Hematology and Oncology)  DIAGNOSIS:  Encounter Diagnoses  Name Primary?  . Malignant neoplasm of lower-outer quadrant of right breast of female, estrogen receptor positive ([place])   . Needs flu shot Yes    SUMMARY OF ONCOLOGIC HISTORY:   Breast cancer of lower-outer quadrant of right female breast ([place])   01/04/1995 Surgery    Right breast cancer status post lumpectomy followed by chemotherapy followed by radiation and tamoxifen 5 years (details are not available)      12/30/2015 Relapse/Recurrence    Chest wall/ soft tissue masses near the right side of the sternum, superior nodule measuring 14.5 mm, inferior nodule measuring 10.5 mm, 26 mm anterior mediastinal soft tissue mass, T9 bone metastases      12/30/2015 Procedure    Thoracentesis: 400 mL removed; showed reactive mesothelial cells, no cancer cells      12/30/2015 Imaging    CT chest: 26 x 17 mm anterior mediastinal soft tissue mass, mixed lytic and sclerotic bone mets mainly involving sternum and T9 vertebral body, scattered vertebral body involvement and rib involvement; chest wall soft tissue masses 14.5 mm, 10.5 mm      01/13/2016 Initial Biopsy    Right chest wall mass biopsy 2:30 position: Invasive ductal carcinoma focally involves skeletal muscle, grade 2, HER-2 negative ratio 1.08, ER/PR 95% positive      01/14/2016 PET scan    Widespread metastatic disease right breast, right chest wall, mediastinum, right internal mammary lymph nodes, right pleural space, malignant pleural effusion, right common iliac lymph node, bone metastases in the thorax      01/21/2016 -  Anti-estrogen oral therapy    Ibrance with letrozole      04/15/2017 - 04/23/2017 Hospital Admission    Pneumonia and confusion       CHIEF COMPLIANT: Follow-up on Ibrance and letrozole  INTERVAL HISTORY: Jessica Zavala is a 71 year old  with above-mentioned history of metastatic breast cancer currently on Ibrance with letrozole.  She is here for 64-monthfollow-up.  She appears to be doing quite well[facility]  [facility] and had done extremely well.  She denies any fatigue or loss of appetite or weight.  REVIEW OF SYSTEMS:   Constitutional: Denies fevers, chills or abnormal weight loss Eyes: Denies blurriness of vision Ears, nose, mouth, throat, and face: Denies mucositis or sore throat Respiratory: Denies cough, dyspnea or wheezes Cardiovascular: Denies palpitation, chest discomfort Gastrointestinal:  Denies nausea, heartburn or change in bowel habits Skin: Denies abnormal skin rashes Lymphatics: Denies new lymphadenopathy or easy bruising Neurological:Denies numbness, tingling or new weaknesses Behavioral/Psych: Mood is stable, no new changes  Extremities: No lower extremity edema Breast:  denies any pain or lumps or nodules in either breasts All other systems were reviewed with the patient and are negative.  I have reviewed the past medical history, past surgical history, social history and family history with the patient and they are unchanged from previous note.  ALLERGIES:  is allergic to aspirin.  MEDICATIONS:  Current Outpatient Prescriptions  Medication Sig Dispense Refill  . atorvastatin (LIPITOR) 20 MG tablet Take 1 tablet (20 mg total) by mouth daily. 30 tablet 11  . clobetasol cream (TEMOVATE) 01.61% Apply 1 application topically 2 (two) times daily as needed. (Patient not taking: Reported on 07/11/2017) 30 g 0  . clotrimazole-betamethasone (LOTRISONE) cream  Apply 1 application topically 2 (two) times daily. 45 g 1  . Flurandrenolide 4 MCG/SQCM TAPE Apply 1 each topically at bedtime as needed and may repeat dose one time if needed. 1 each 1  . furosemide (LASIX) 20 MG tablet Take 1 tablet (20 mg total) by mouth daily. 30 tablet 0  . labetalol (NORMODYNE) 200 MG  tablet Take 1 tablet (200 mg total) by mouth 2 (two) times daily. 30 tablet 1  . letrozole (FEMARA) 2.5 MG tablet Take 1 tablet (2.5 mg total) by mouth daily. 90 tablet 3  . palbociclib (IBRANCE) 75 MG capsule Take 1 capsule (75 mg total) by mouth daily. Take whole with food. 2 weeks on 2 weeks off 14 capsule 6   No current facility-administered medications for this visit.    Facility-Administered Medications Ordered in Other Visits  Medication Dose Route Frequency Provider Last Rate Last Dose  . [START ON 09/29/2017] Influenza vac split quadrivalent PF (FLUZONE HIGH-DOSE) injection 0.5 mL  0.5 mL Intramuscular Tomorrow-1000 [name], MD        PHYSICAL EXAMINATION: ECOG PERFORMANCE STATUS: 1 - Symptomatic but completely ambulatory  Vitals:   09/28/17 1057  BP: 129/65  Pulse: 65  Resp: 18  Temp: 98.4 F (36.9 C)  SpO2: 96%   Filed Weights   09/28/17 1057  Weight: 113 lb 12.8 oz (51.6 kg)    GENERAL:alert, no distress and comfortable SKIN: skin color, texture, turgor are normal, no rashes or significant lesions EYES: normal, Conjunctiva are pink and non-injected, sclera clear OROPHARYNX:no exudate, no erythema and lips, buccal mucosa, and tongue normal  NECK: supple, thyroid normal size, non-tender, without nodularity LYMPH:  no palpable lymphadenopathy in the cervical, axillary or inguinal LUNGS: clear to auscultation and percussion with normal breathing effort HEART: regular rate & rhythm and no murmurs and no lower extremity edema ABDOMEN:abdomen soft, non-tender and normal bowel sounds MUSCULOSKELETAL:no cyanosis of digits and no clubbing  NEURO: alert & oriented x 3 with fluent speech, no focal motor/sensory deficits EXTREMITIES: No lower extremity edema  LABORATORY DATA:  I have reviewed the data as listed   Chemistry      Component Value Date/Time   NA 140 09/28/2017 1031   K 4.8 09/28/2017 1031   CL 91 (L) 04/23/2017 0539   CO2 32 (H) 09/28/2017 1031    BUN 15.4 09/28/2017 1031   CREATININE 0.8 09/28/2017 1031      Component Value Date/Time   CALCIUM 9.6 09/28/2017 1031   ALKPHOS 50 09/28/2017 1031   AST 24 09/28/2017 1031   ALT 16 09/28/2017 1031   BILITOT 0.59 09/28/2017 1031       Lab Results  Component Value Date   WBC 2.4 (L) 09/28/2017   HGB 12.0 09/28/2017   HCT 35.3 09/28/2017   MCV 99.4 09/28/2017   PLT 234 09/28/2017   NEUTROABS 0.8 (L) 09/28/2017    ASSESSMENT & PLAN:  Breast cancer of lower-outer quadrant of right female breast ([place]) Metastatic breast cancer PET-CT 01/14/16: Widespread metastatic disease right breast, right chest wall several nodules largest 3.7 cm, mediastinum, right internal mammary lymph node 2.2 cm, right pleural-based nodules 2.1cm and 2.7 cm, malignant pleural effusion, right common iliac lymph node, bone metastases in the thorax (sternum, manubrium, multiple thoracic vertebral bodies, multiple right-sided ribs)  Right chest wall mass biopsy 2:30 position 01/13/2016: Invasive ductal carcinoma focally involves skeletal muscle, grade 2, HER-2 negative ratio 1.08, ER/PR pending (Right breast cancer 1996 status post lumpectomy followed by chemotherapy  followed by radiation and tamoxifen 5 years (details are not available)) ---------------------------------------------------------------------------------------------------------------------------------------------------------- Treatment plan: Ibrance with letrozole started 01/20/2016 , cycle 3 Ibrance 100 mg, Cycle 4 Ibrance 75 mg dose; switch to 75 mg 3 weeks on 2 weeks of from 07/22/2016, changed to 2 weeks on 2 weeks off12/12/2015.  Ibrance toxicities: 1. Neutropenia: Today's ANC is 0.8 Currently on 75 mg dose 2weeks on followed by 2 weeks off Denies any hot flashes or myalgias related to letrozole.  Bone metastases: OnXgeva to q 3 months. Patient currently takes calcium with vitamin D Hospitalization: Pneumonia and Confusion 5/12 and  5/20, patient completely recovered.  CT chest abdomen pelvis 06/09/2017: Stable disease in bone and right internal mammary lymph node  Return to clinic in 3 months with scans and follow-up along with Xgeva   I spent 25 minutes talking to the patient of which more than half was spent in counseling and coordination of care.  Orders Placed This Encounter  Procedures  . NM Bone Scan Whole Body    Standing Status:   Future    Standing Expiration Date:   09/28/2018    Order Specific Question:   If indicated for the ordered procedure, I authorize the administration of a radiopharmaceutical per Radiology protocol    Answer:   Yes    Order Specific Question:   Preferred imaging location?    Answer:   [facility]    Order Specific Question:   Radiology Contrast Protocol - do NOT remove file path    Answer:   [email or web address]   The patient has a good understanding of the overall plan. she agrees with it. she will call with any problems that may develop before the next visit here.   Rulon Eisenmenger, MD 09/28/17

## 2017-09-29 ENCOUNTER — Ambulatory Visit: Payer: Medicare HMO

## 2017-09-29 ENCOUNTER — Other Ambulatory Visit: Payer: Medicare HMO

## 2017-09-29 ENCOUNTER — Ambulatory Visit: Payer: Medicare HMO | Admitting: Hematology and Oncology

## 2017-10-03 DIAGNOSIS — H04123 Dry eye syndrome of bilateral lacrimal glands: Secondary | ICD-10-CM | POA: Diagnosis not present

## 2017-10-03 DIAGNOSIS — Z961 Presence of intraocular lens: Secondary | ICD-10-CM | POA: Diagnosis not present

## 2017-10-03 DIAGNOSIS — H10413 Chronic giant papillary conjunctivitis, bilateral: Secondary | ICD-10-CM | POA: Diagnosis not present

## 2017-10-20 MED FILL — IBRANCE 75 MG CAPSULE: 75 | 28 days supply | Qty: 14 | Fill #6

## 2017-11-07 ENCOUNTER — Ambulatory Visit: Payer: Medicare HMO

## 2017-11-09 ENCOUNTER — Other Ambulatory Visit: Payer: Self-pay | Admitting: Hematology and Oncology

## 2017-11-14 ENCOUNTER — Ambulatory Visit: Payer: Medicare HMO

## 2017-11-16 MED FILL — IBRANCE 75 MG CAPSULE: 75 | 28 days supply | Qty: 14 | Fill #0

## 2017-11-17 ENCOUNTER — Ambulatory Visit: Payer: Medicare HMO

## 2017-11-24 ENCOUNTER — Ambulatory Visit: Payer: Medicare HMO

## 2017-12-01 ENCOUNTER — Telehealth: Payer: Self-pay | Admitting: Pharmacy Technician

## 2017-12-01 NOTE — Telephone Encounter (Signed)
Oral Oncology Patient Advocate Encounter  Was successful in securing patient a $15,000 grant from [employer] to provide copayment coverage for [place]. This will keep the out of pocket expense at $0.   I have spoken with the patient's son.   The billing information is as follows and has been shared with [place].   Member ID: [id number] Group ID: [id number] RxBin: [id number] Dates of Eligibility: 11/01/2017 through 10/31/2018  Fabio Asa. Melynda Keller, [place] Patient [place] [phone] 12/01/2017 3:05 PM

## 2017-12-08 MED FILL — IBRANCE 75 MG CAPSULE: 75 | 28 days supply | Qty: 14 | Fill #1

## 2017-12-12 DIAGNOSIS — Z01419 Encounter for gynecological examination (general) (routine) without abnormal findings: Secondary | ICD-10-CM | POA: Diagnosis not present

## 2017-12-12 DIAGNOSIS — Z6825 Body mass index (BMI) 25.0-25.9, adult: Secondary | ICD-10-CM | POA: Diagnosis not present

## 2017-12-29 ENCOUNTER — Encounter (HOSPITAL_COMMUNITY): Payer: Medicare HMO

## 2017-12-29 ENCOUNTER — Encounter (HOSPITAL_COMMUNITY)
Admission: RE | Admit: 2017-12-29 | Discharge: 2017-12-29 | Disposition: A | Discharge status: Home / Self Care | Payer: Medicare HMO | Source: Ambulatory Visit | Attending: Hematology and Oncology | Admitting: Hematology and Oncology

## 2017-12-29 ENCOUNTER — Inpatient Hospital Stay: Payer: Medicare HMO | Attending: Hematology and Oncology

## 2017-12-29 DIAGNOSIS — C778 Secondary and unspecified malignant neoplasm of lymph nodes of multiple regions: Secondary | ICD-10-CM | POA: Diagnosis not present

## 2017-12-29 DIAGNOSIS — C7951 Secondary malignant neoplasm of bone: Secondary | ICD-10-CM | POA: Diagnosis not present

## 2017-12-29 DIAGNOSIS — Z170 Estrogen receptor positive status [ER+]: Secondary | ICD-10-CM | POA: Insufficient documentation

## 2017-12-29 DIAGNOSIS — C50919 Malignant neoplasm of unspecified site of unspecified female breast: Secondary | ICD-10-CM | POA: Diagnosis not present

## 2017-12-29 DIAGNOSIS — C50511 Malignant neoplasm of lower-outer quadrant of right female breast: Secondary | ICD-10-CM | POA: Insufficient documentation

## 2017-12-29 LAB — CBC WITH DIFFERENTIAL/PLATELET
Basophils Absolute: 0 10*3/uL (ref 0.0–0.1)
Basophils Relative: 1 %
EOS PCT: 5 %
Eosinophils Absolute: 0.2 10*3/uL (ref 0.0–0.5)
HCT: 35.8 % (ref 34.8–46.6)
Hemoglobin: 12.3 g/dL (ref 11.6–15.9)
LYMPHS ABS: 1.5 10*3/uL (ref 0.9–3.3)
Lymphocytes Relative: 54 %
MCH: 33.6 pg (ref 25.1–34.0)
MCHC: 34.4 g/dL (ref 31.5–36.0)
MCV: 97.8 fL (ref 79.5–101.0)
MONO ABS: 0.4 10*3/uL (ref 0.1–0.9)
MONOS PCT: 15 %
Neutro Abs: 0.7 10*3/uL — ABNORMAL LOW (ref 1.5–6.5)
Neutrophils Relative %: 25 %
PLATELETS: 182 10*3/uL (ref 145–400)
RBC: 3.66 MIL/uL — AB (ref 3.70–5.45)
RDW: 15.2 % (ref 11.2–16.1)
WBC: 2.8 10*3/uL — AB (ref 3.9–10.3)

## 2017-12-29 LAB — COMPREHENSIVE METABOLIC PANEL
ALBUMIN: 3.8 g/dL (ref 3.5–5.0)
ALT: 12 U/L (ref 0–55)
AST: 21 U/L (ref 5–34)
Alkaline Phosphatase: 46 U/L (ref 40–150)
Anion gap: 8 (ref 3–11)
BUN: 19 mg/dL (ref 7–26)
CHLORIDE: 100 mmol/L (ref 98–109)
CO2: 28 mmol/L (ref 22–29)
Calcium: 9.3 mg/dL (ref 8.4–10.4)
Creatinine, Ser: 0.79 mg/dL (ref 0.60–1.10)
GFR calc Af Amer: >60 mL/min (ref >60)
GLUCOSE: 96 mg/dL (ref 70–140)
Potassium: 4.2 mmol/L (ref 3.3–4.7)
Sodium: 136 mmol/L (ref 136–145)
Total Bilirubin: 0.5 mg/dL (ref 0.2–1.2)
Total Protein: 7.7 g/dL (ref 6.4–8.3)

## 2017-12-29 MED ORDER — TECHNETIUM TC 99M MEDRONATE IV KIT
19.9000 | PACK | Freq: Once | INTRAVENOUS | Status: AC | PRN
  Administered 2017-12-29: 19.9 via INTRAVENOUS

## 2018-01-04 NOTE — Assessment & Plan Note (Signed)
Metastatic breast cancer PET-CT 01/14/16: Widespread metastatic disease right breast, right chest wall several nodules largest 3.7 cm, mediastinum, right internal mammary lymph node 2.2 cm, right pleural-based nodules 2.1cm and 2.7 cm, malignant pleural effusion, right common iliac lymph node, bone metastases in the thorax (sternum, manubrium, multiple thoracic vertebral bodies, multiple right-sided ribs)  Right chest wall mass biopsy 2:30 position 01/13/2016: Invasive ductal carcinoma focally involves skeletal muscle, grade 2, HER-2 negative ratio 1.08, ER/PR pending (Right breast cancer 1996 status post lumpectomy followed by chemotherapy followed by radiation and tamoxifen 5 years (details are not available)) ---------------------------------------------------------------------------------------------------------------------------------------------------------- Treatment plan: Ibrance with letrozole started 01/20/2016 , cycle 3 Ibrance 100 mg, Cycle 4 Ibrance 75 mg dose; switch to 75 mg 3 weeks on 2 weeks of from 07/22/2016, changed to 2 weeks on 2 weeks off12/12/2015.  Ibrance toxicities: 1. Neutropenia: Today's ANC is 1.2 Currently on 75 mg dose 2weeks on followed by 2 weeks off Denies any hot flashes or myalgias related to letrozole.  Bone metastases: OnXgeva to q 3 months. Patient currently takes calcium with vitamin D Hospitalization: Pneumonia and Confusion 5/12 and 5/20, patient completely recovered.  Bone Scan 12/29/17: Stable bone mets  RTC in 6 months for followup

## 2018-01-05 ENCOUNTER — Telehealth: Payer: Self-pay | Admitting: Hematology and Oncology

## 2018-01-05 ENCOUNTER — Inpatient Hospital Stay: Payer: Medicare HMO | Attending: Hematology and Oncology | Admitting: Hematology and Oncology

## 2018-01-05 ENCOUNTER — Inpatient Hospital Stay: Payer: Medicare HMO

## 2018-01-05 DIAGNOSIS — D701 Agranulocytosis secondary to cancer chemotherapy: Secondary | ICD-10-CM | POA: Diagnosis not present

## 2018-01-05 DIAGNOSIS — C7951 Secondary malignant neoplasm of bone: Secondary | ICD-10-CM | POA: Insufficient documentation

## 2018-01-05 DIAGNOSIS — Z170 Estrogen receptor positive status [ER+]: Secondary | ICD-10-CM | POA: Diagnosis not present

## 2018-01-05 DIAGNOSIS — M545 Low back pain: Secondary | ICD-10-CM | POA: Diagnosis not present

## 2018-01-05 DIAGNOSIS — C50511 Malignant neoplasm of lower-outer quadrant of right female breast: Secondary | ICD-10-CM | POA: Diagnosis not present

## 2018-01-05 MED ORDER — DENOSUMAB 120 MG/1.7ML ~~LOC~~ SOLN
120.0000 mg | Freq: Once | SUBCUTANEOUS | Status: AC
  Administered 2018-01-05: 120 mg via SUBCUTANEOUS

## 2018-01-05 NOTE — Telephone Encounter (Signed)
Gave patient avs and calendar with appts per 2/1 los.  °

## 2018-01-05 NOTE — Patient Instructions (Signed)
Influenza Virus Vaccine injection What is this medicine? INFLUENZA VIRUS VACCINE (in floo EN zuh VAHY ruhs vak SEEN) helps to reduce the risk of getting influenza also known as the flu. The vaccine only helps protect you against some strains of the flu. This medicine may be used for other purposes; ask your health care provider or pharmacist if you have questions. COMMON BRAND NAME(S): Afluria, Agriflu, Alfuria, FLUAD, Fluarix, Fluarix Quadrivalent, Flublok, Flublok Quadrivalent, FLUCELVAX, Flulaval, Fluvirin, Fluzone, Fluzone High-Dose, Fluzone Intradermal What should I tell my health care provider before I take this medicine? They need to know if you have any of these conditions: -bleeding disorder like hemophilia -fever or infection -Guillain-Barre syndrome or other neurological problems -immune system problems -infection with the human immunodeficiency virus (HIV) or AIDS -low blood platelet counts -multiple sclerosis -an unusual or allergic reaction to influenza virus vaccine, latex, other medicines, foods, dyes, or preservatives. Different brands of vaccines contain different allergens. Some may contain latex or eggs. Talk to your doctor about your allergies to make sure that you get the right vaccine. -pregnant or trying to get pregnant -breast-feeding How should I use this medicine? This vaccine is for injection into a muscle or under the skin. It is given by a health care professional. A copy of Vaccine Information Statements will be given before each vaccination. Read this sheet carefully each time. The sheet may change frequently. Talk to your healthcare provider to see which vaccines are right for you. Some vaccines should not be used in all age groups. Overdosage: If you think you have taken too much of this medicine contact a poison control center or emergency room at once. NOTE: This medicine is only for you. Do not share this medicine with others. What if I miss a dose? This  does not apply. What may interact with this medicine? -chemotherapy or radiation therapy -medicines that lower your immune system like etanercept, anakinra, infliximab, and adalimumab -medicines that treat or prevent blood clots like warfarin -phenytoin -steroid medicines like prednisone or cortisone -theophylline -vaccines This list may not describe all possible interactions. Give your health care provider a list of all the medicines, herbs, non-prescription drugs, or dietary supplements you use. Also tell them if you smoke, drink alcohol, or use illegal drugs. Some items may interact with your medicine. What should I watch for while using this medicine? Report any side effects that do not go away within 3 days to your doctor or health care professional. Call your health care provider if any unusual symptoms occur within 6 weeks of receiving this vaccine. You may still catch the flu, but the illness is not usually as bad. You cannot get the flu from the vaccine. The vaccine will not protect against colds or other illnesses that may cause fever. The vaccine is needed every year. What side effects may I notice from receiving this medicine? Side effects that you should report to your doctor or health care professional as soon as possible: -allergic reactions like skin rash, itching or hives, swelling of the face, lips, or tongue Side effects that usually do not require medical attention (report to your doctor or health care professional if they continue or are bothersome): -fever -headache -muscle aches and pains -pain, tenderness, redness, or swelling at the injection site -tiredness This list may not describe all possible side effects. Call your doctor for medical advice about side effects. You may report side effects to FDA at 1-800-FDA-1088. Where should I keep my medicine? The vaccine will   be given by a health care professional in a clinic, pharmacy, doctor's office, or other health care  setting. You will not be given vaccine doses to store at home. NOTE: This sheet is a summary. It may not cover all possible information. If you have questions about this medicine, talk to your doctor, pharmacist, or health care provider.  2018 Elsevier/Gold Standard (2015-06-12 10:07:28) Denosumab injection ?y l thu?c g? DENOSUMAB lm ch?m s? suy y?u x??ng. Prolia ???c dng ?? ?i?u tr? ch?ng long x??ng ? ph? n? sau khi mn kinh v ? nam gi?i. Xgeva ???c dng ?? phng ng?a gy x??ng v cc v?n ?? khc v? x??ng gy ra b?i di c?n x??ng do ung th?[place] See c?ng ???c dng ?? ?i?u tr? b??u t? bo kh?ng l? (giant cell tumor) c?a x??ng. Thu?c ny c th? ???c dng cho nh?ng m?c ?ch khc; hy h?i ng??i cung c?p d?ch v? y t? ho?c d??c s? c?a mnh, n?u qu v? c th?c m?c. Ti c?n ph?i bo cho ng??i cung c?p d?ch v? y t? c?a mnh ?i?u g tr??c khi dng thu?c ny? H? c?n bi?t li?u qu v? c b?t k? tnh tr?ng no sau ?y khng: -b?nh v? r?ng -chm da -nhi?m trng ho?c ti?n s? nhi?m trng -b?nh th?n ho?c n?u qu v? ?ang ???c th?m phn -m?c calci ho?c vitamin D trong mu th?p -h?i ch?ng r?i lo?n h?p th? -???c x?p vo l?ch ?? gi?i ph?u ho?c nh? r?ng -?ang du?ng thu?c co? ch??a denosumab -b?nh tuy?n gip tr?ng ho?c tuy?n c?n gip tr?ng -ph?n ?ng b?t th??ng v?i denosumab -pha?n ??ng b?t th???ng ho??c di? ??ng v??i ca?c d??c ph?m kha?c -pha?n ??ng b?t th???ng ho??c di? ??ng v??i th??c ph?m, thu?c nhu?m, ho??c ch?t ba?o qua?n -?ang c thai ho??c ??nh co? thai -?ang cho con bu? Ti nn s? d?ng thu?c ny nh? th? no? Thu?c ny ?? tim d??i da. Thu?c ny ???c s? d?ng b?i chuyn vin y t? ? b?nh vi?n ho?c ? phng m?ch. N?u qu v? s?p dng thu?c Prolia, d??c s? s? ??a cho qu v? m?t B?n H??ng D?n v? D??c Ph?m (MedGuide) ??c bi?t cho m?i toa thu?c v cho m?i l?n mua thm thu?c ?. Hy b?o ??m ??c k? thng tin ny m?i l?n. ??i v?i Prolia, hy bn v?i bc s? nhi khoa c?a qu v? v? vi?c dng thu?c ny ? tr?  em. C th? c?n ch?m [place] ??c bi?t. ??i v?i Xgeva, hy bn v?i bc s? nhi khoa c?a qu v? v? vi?c dng thu?c ny ? tr? em. Thu?c ny c th? ???c k toa cho tr? em ch? m?i 13 tu?i trong nh?ng tr??ng h?p ch?n l?c, nh?ng c?n ph?i th?n tr?ng. Qu li?u: N?u qu v? cho r?ng mnh ? dng qu nhi?u thu?c ny, th hy lin l?c v?i trung tm ki?m sot ch?t ??c ho?c phng c?p c?u ngay l?p t?c. L?U : Thu?c ny ch? dnh ring cho qu v?. Khng chia s? thu?c ny v?i nh?ng ng??i khc. N?u ti l? qun m?t li?u th sao? ?i?u quan tr?ng l khng nn b? l? li?u thu?c no. Hy lin l?c v?i bc s? ho?c [place] vin y t? c?a mnh, n?u qu v? khng th? gi? ?ng cu?c h?n khm. Nh?ng g c th? t??ng tc v?i thu?c ny? Khng ???c dng thu?c ny cng v?i b?t k? th? no sau ?y: -nh??ng thu?c kha?c co? ch??a denosumab Thu?c ny c?ng c th? t??ng tc v?i cc thu?c sau ?y: -cc thu?c ?  c ch? h? mi?n d?ch -cc thu?c ?i?u tr? b?nh ung th? -cc thu?c steroid, ch?ng h?n nh? prednisone ho?c cortisone [facility] sch ny c th? khng m t? ?? h?t cc t??ng tc c th? x?y ra. Hy ??a cho ng??i cung c?p d?ch v? y t? c?a mnh danh sch t?t c? cc thu?c, th?o d??c, cc thu?c khng c?n toa, ho?c cc ch? ph?m b? sung m qu v? dng. C?ng nn bo cho h? bi?t r?ng qu v? c ht thu?c, u?ng r??u, ho?c c s? d?ng ma ty tri php hay khng. Vi th? c th? t??ng tc v?i thu?c c?a qu v?. Ti c?n ph?i theo di ?i?u g trong khi dng thu?c ny? Hy ?i g?p bc s? ho?c chuyn vin y t? ?? theo di ??nh k? s? c?i thi?n c?a qu v?. Bc s? c th? yu c?u th? mu v lm cc xt nghi?m khc ?? theo di s? c?i thi?n c?a qu v?. Hy lin l?c v?i bc s? ho?c chuyn vin y t?, n?u qu v? b? c?m l?nh ho?c b? b?nh nhi?m trng khc trong khi dng thu?c ny. Khng ???c t? ?i?u tr? cho mnh. Thu?c ny c th? lm gi?m kh? n?ng ch?ng l?i cc b?nh nhi?m trng c?a c? th?. Qu v? c?n ph?i ch?c ch?n r?ng c? th? qu v? nh?n ???c ??y ?? l??ng calci v vitamin D trong th?i gian  dng thu?c ny, tr? khi bc s? yu c?u qu v? ??ng lm nh? v?y. Hy th?o lu?n v?i bc s? ho?c chuyn vin y t? v? nh?ng th?c ph?m qu v? ?n v nh?ng vitamin qu v? dng. Hy ?i nha s? th??ng xuyn. ?nh r?ng ho?c x?a r?ng b?ng ch? nha khoa nh? ? ???c ch? d?n. N?u qu v? c ?i lm r?ng, th hy bo v?i nha s? r?ng qu v? ?ang dng thu?c ny. Khng ???c c thai trong th?i gian dng thu?c ny ho?c trong vng 5 thng sau khi ng?ng dng thu?c. Ph? n? c?n ph?i thng bo cho bc s? c?a mnh, n?u mu?n c thai ho?c ngh? r?ng c th? mnh ? c [place]. C nguy c? v? cc tc d?ng ph? nghim tr?ng ??i v?i [place] nhi. Hy th?o lu?n v?i bc s? ho?c chuyn vin y t? ho?c d??c s? ?? bi?t thm thng tin. Ti c th? nh?n th?y nh?ng tc d?ng ph? no khi dng thu?c ny? Nh?ng tc d?ng ph? qu v? c?n ph?i bo cho bc s? ho?c chuyn vin y t? cng s?m cng t?t: -cc ph?n ?ng d? ?ng, ch?ng h?n nh? da b? m?n ??, ng?a, n?i my ?ay, s?ng ? m?t, mi, ho?c l??i -cc v?n ?? v? h h?p -?au ng?c -tim ??p nhanh ho?c khng ??u -c?m th?y chong vng, ng?t x?u, b? t -s?t, ?n l?nh, ho?c b?t k? d?u hi?u nhi?m trng no khc -co th?t, c?ng, ho?c gi?t c? b?p -t ho?c c?m gic nh? b? ki?n b -da b? r?p ho?c c u c?c, ho?c da b? kh, bong trc ho?c ?? -v?t th??ng lu lnh ho?c ?au khng r nguyn nhn ? mi?ng ho?c hm -b? b?m tm ho?c xu?t huy?t b?t th??ng Cc tc d?ng ph? khng c?n ph?i ch?m [place] y t? (Hy bo cho bc s? ho?c chuyn vin y t? n?u cc tc d?ng ph? ny ti?p di?n ho?c gy phi?n toi): -?au c? b?p -kh ch?u bao t?, ??y h?i [facility] sch ny c th? khng m t? ?? h?t cc tc d?ng ph? c th? x?y ra. Tawni Pummel  g?i t?i bc s? c?a mnh ?? ???c c? v?n chuyn mn v? cc tc d?ng ph?Sander Nephew v? c th? t??ng trnh cc tc d?ng ph? cho FDA theo s? [phone]. Ti nn c?t gi? thu?c c?a mnh ? ?u? Thu?c ny ch? ???c dng trong phng m?ch, v?n phng bc s? ho?c c? s? y t? khc v s? khng ???c c?t gi? t?i nh. L?U : ?y l b?n tm t?t. N c th?  khng bao hm t?t c? thng tin c th? c. N?u qu v? th?c m?c v? thu?c ny, xin trao ??i v?i bc s?, d??c s?, ho?c ng??i cung c?p d?ch v? y t? c?a mnh.    2016, Elsevier/Gold Standard. (2012-07-05 00:00:00)

## 2018-01-05 NOTE — Progress Notes (Signed)
Patient Care Team: [name], MD as PCP - General (Internal Medicine) [name], MD as Consulting Physician (Hematology and Oncology)  DIAGNOSIS:  Encounter Diagnosis  Name Primary?  . Malignant neoplasm of lower-outer quadrant of right breast of female, estrogen receptor positive ([place])     SUMMARY OF ONCOLOGIC HISTORY:   Breast cancer of lower-outer quadrant of right female breast ([place])   01/04/1995 Surgery    Right breast cancer status post lumpectomy followed by chemotherapy followed by radiation and tamoxifen 5 years (details are not available)      12/30/2015 Relapse/Recurrence    Chest wall/ soft tissue masses near the right side of the sternum, superior nodule measuring 14.5 mm, inferior nodule measuring 10.5 mm, 26 mm anterior mediastinal soft tissue mass, T9 bone metastases      12/30/2015 Procedure    Thoracentesis: 400 mL removed; showed reactive mesothelial cells, no cancer cells      12/30/2015 Imaging    CT chest: 26 x 17 mm anterior mediastinal soft tissue mass, mixed lytic and sclerotic bone mets mainly involving sternum and T9 vertebral body, scattered vertebral body involvement and rib involvement; chest wall soft tissue masses 14.5 mm, 10.5 mm      01/13/2016 Initial Biopsy    Right chest wall mass biopsy 2:30 position: Invasive ductal carcinoma focally involves skeletal muscle, grade 2, HER-2 negative ratio 1.08, ER/PR 95% positive      01/14/2016 PET scan    Widespread metastatic disease right breast, right chest wall, mediastinum, right internal mammary lymph nodes, right pleural space, malignant pleural effusion, right common iliac lymph node, bone metastases in the thorax      01/21/2016 -  Anti-estrogen oral therapy    Ibrance with letrozole      04/15/2017 - 04/23/2017 Hospital Admission    Pneumonia and confusion       CHIEF COMPLIANT: Follow-up on Ibrance with letrozole  INTERVAL HISTORY: Jessica Zavala is a 72 year old with above-mentioned  some metastatic breast cancer is on Ibrance with letrozole.  She had a bone scan recently that showed stable findings and bone metastases.  She has been tolerating Ibrance reasonably well except that her neutrophil count appears to be not recovering to normal.  Today her neutrophil count is 0.7.  She does not have any fevers or chills.  She is otherwise doing quite well.  She is planning to go to [place] for 2 months.  She has intermittent mild back pain.  She does not to have to take any medications for it.  REVIEW OF SYSTEMS:   Constitutional: Denies fevers, chills or abnormal weight loss Eyes: Denies blurriness of vision Ears, nose, mouth, throat, and face: Denies mucositis or sore throat Respiratory: Denies cough, dyspnea or wheezes Cardiovascular: Denies palpitation, chest discomfort Gastrointestinal:  Denies nausea, heartburn or change in bowel habits Skin: Denies abnormal skin rashes Lymphatics: Denies new lymphadenopathy or easy bruising Neurological:Denies numbness, tingling or new weaknesses Behavioral/Psych: Mood is stable, no new changes  Extremities: No lower extremity edema  All other systems were reviewed with the patient and are negative.  I have reviewed the past medical history, past surgical history, social history and family history with the patient and they are unchanged from previous note.  ALLERGIES:  is allergic to aspirin.  MEDICATIONS:  Current Outpatient Medications  Medication Sig Dispense Refill  . atorvastatin (LIPITOR) 20 MG tablet Take 1 tablet (20 mg total) by mouth daily. 30 tablet 11  . clobetasol cream (TEMOVATE) 0.05 % Apply  1 application topically 2 (two) times daily as needed. (Patient not taking: Reported on 07/11/2017) 30 g 0  . clotrimazole-betamethasone (LOTRISONE) cream Apply 1 application topically 2 (two) times daily. 45 g 1  . Flurandrenolide 4 MCG/SQCM TAPE Apply 1 each topically at bedtime as needed and may repeat dose one time if needed. 1  each 1  . furosemide (LASIX) 20 MG tablet Take 1 tablet (20 mg total) by mouth daily. 30 tablet 0  . IBRANCE 75 MG capsule TAKE 1 CAPSULE BY MOUTH DAILY WITH FOOD. TAKE FOR 2 WEEKS ON 2 WEEKS OFF 14 capsule 6  . labetalol (NORMODYNE) 200 MG tablet Take 1 tablet (200 mg total) by mouth 2 (two) times daily. 30 tablet 1  . letrozole (FEMARA) 2.5 MG tablet Take 1 tablet (2.5 mg total) by mouth daily. 90 tablet 3   No current facility-administered medications for this visit.    Facility-Administered Medications Ordered in Other Visits  Medication Dose Route Frequency Provider Last Rate Last Dose  . denosumab (XGEVA) injection 120 mg  120 mg Subcutaneous Once [name], MD        PHYSICAL EXAMINATION: ECOG PERFORMANCE STATUS: 1 - Symptomatic but completely ambulatory  Vitals:   01/05/18 0928  BP: 118/64  Pulse: 75  Resp: 16  Temp: (!) 97.5 F (36.4 C)  SpO2: 98%   Filed Weights   01/05/18 0928  Weight: 116 lb 8 oz (52.8 kg)    GENERAL:alert, no distress and comfortable SKIN: skin color, texture, turgor are normal, no rashes or significant lesions EYES: normal, Conjunctiva are pink and non-injected, sclera clear OROPHARYNX:no exudate, no erythema and lips, buccal mucosa, and tongue normal  NECK: supple, thyroid normal size, non-tender, without nodularity LYMPH:  no palpable lymphadenopathy in the cervical, axillary or inguinal LUNGS: clear to auscultation and percussion with normal breathing effort HEART: regular rate & rhythm and no murmurs and no lower extremity edema ABDOMEN:abdomen soft, non-tender and normal bowel sounds MUSCULOSKELETAL:no cyanosis of digits and no clubbing  NEURO: alert & oriented x 3 with fluent speech, no focal motor/sensory deficits EXTREMITIES: No lower extremity edema  LABORATORY DATA:  I have reviewed the data as listed CMP Latest Ref Rng & Units 12/29/2017 09/28/2017 06/30/2017  Glucose 70 - 140 mg/dL 96 94 99  BUN 7 - 26 mg/dL 19 15.4 20.0    Creatinine 0.60 - 1.10 mg/dL 0.79 0.8 0.8  Sodium 136 - 145 mmol/L 136 140 140  Potassium 3.3 - 4.7 mmol/L 4.2 4.8 4.4  Chloride 98 - 109 mmol/L 100 - -  CO2 22 - 29 mmol/L 28 32(H) 31(H)  Calcium 8.4 - 10.4 mg/dL 9.3 9.6 9.6  Total Protein 6.4 - 8.3 g/dL 7.7 7.7 7.6  Total Bilirubin 0.2 - 1.2 mg/dL 0.5 0.59 0.50  Alkaline Phos 40 - 150 U/L 46 50 45  AST 5 - 34 U/L _0 ALT 0 - 55 U/L _1 Lab Results  Component Value Date   WBC 2.8 (L) 12/29/2017   HGB 12.3 12/29/2017   HCT 35.8 12/29/2017   MCV 97.8 12/29/2017   PLT 182 12/29/2017   NEUTROABS 0.7 (L) 12/29/2017    ASSESSMENT & PLAN:  Breast cancer of lower-outer quadrant of right female breast ([place]) Metastatic breast cancer PET-CT 01/14/16: Widespread metastatic disease right breast, right chest wall several nodules largest 3.7 cm, mediastinum, right internal mammary lymph node 2.2 cm, right pleural-based nodules 2.1cm and 2.7 cm, malignant pleural effusion, right  common iliac lymph node, bone metastases in the thorax (sternum, manubrium, multiple thoracic vertebral bodies, multiple right-sided ribs)  Right chest wall mass biopsy 2:30 position 01/13/2016: Invasive ductal carcinoma focally involves skeletal muscle, grade 2, HER-2 negative ratio 1.08, ER/PR pending (Right breast cancer 1996 status post lumpectomy followed by chemotherapy followed by radiation and tamoxifen 5 years (details are not available)) ---------------------------------------------------------------------------------------------------------------------------------------------------------- Treatment plan: Ibrance with letrozole started 01/20/2016 , cycle 3 Ibrance 100 mg, Cycle 4 Ibrance 75 mg dose; switch to 75 mg 3 weeks on 2 weeks of from 07/22/2016, changed to 2 weeks on 2 weeks off12/12/2015.  Ibrance toxicities: 1. Neutropenia: Today's ANC is 1.2 Currently on 75 mg dose 2weeks on followed by 2 weeks off, patient is most likely to need 3  weeks off. Denies any hot flashes or myalgias related to letrozole.  Bone metastases: OnXgeva to q 3 months. Patient currently takes calcium with vitamin D Hospitalization: Pneumonia and Confusion 5/12 and 5/20, patient completely recovered.  Bone Scan 12/29/17: Stable bone mets  RTC in 3 months for followup   I spent 25 minutes talking to the patient of which more than half was spent in counseling and coordination of care.  Orders Placed This Encounter  Procedures  . CBC with Differential (Cancer Center Only)    Standing Status:   Future    Standing Expiration Date:   01/05/2019  . CMP ([facility] only)    Standing Status:   Future    Standing Expiration Date:   01/05/2019   The patient has a good understanding of the overall plan. she agrees with it. she will call with any problems that may develop before the next visit here.   Harriette Ohara, MD 01/05/18

## 2018-01-08 ENCOUNTER — Other Ambulatory Visit: Payer: Self-pay | Admitting: Pharmacist

## 2018-01-10 MED FILL — IBRANCE 75 MG CAPSULE: 75 | 28 days supply | Qty: 14 | Fill #2

## 2018-02-08 MED FILL — IBRANCE 75 MG CAPSULE: 75 | 28 days supply | Qty: 14 | Fill #3

## 2018-02-19 ENCOUNTER — Other Ambulatory Visit: Payer: Self-pay | Admitting: Hematology and Oncology

## 2018-02-19 DIAGNOSIS — C50511 Malignant neoplasm of lower-outer quadrant of right female breast: Secondary | ICD-10-CM

## 2018-03-05 MED FILL — IBRANCE 75 MG CAPSULE: 75 | 28 days supply | Qty: 14 | Fill #4

## 2018-03-10 ENCOUNTER — Emergency Department (HOSPITAL_COMMUNITY)
Admission: EM | Admit: 2018-03-10 | Discharge: 2018-03-11 | Disposition: A | Discharge status: Home / Self Care | Payer: Medicare HMO | Attending: Emergency Medicine | Admitting: Emergency Medicine

## 2018-03-10 ENCOUNTER — Encounter (HOSPITAL_COMMUNITY): Payer: Self-pay

## 2018-03-10 ENCOUNTER — Other Ambulatory Visit: Payer: Self-pay

## 2018-03-10 DIAGNOSIS — Z853 Personal history of malignant neoplasm of breast: Secondary | ICD-10-CM | POA: Insufficient documentation

## 2018-03-10 DIAGNOSIS — B029 Zoster without complications: Secondary | ICD-10-CM | POA: Diagnosis not present

## 2018-03-10 DIAGNOSIS — I10 Essential (primary) hypertension: Secondary | ICD-10-CM | POA: Insufficient documentation

## 2018-03-10 DIAGNOSIS — R21 Rash and other nonspecific skin eruption: Secondary | ICD-10-CM | POA: Diagnosis not present

## 2018-03-10 DIAGNOSIS — Z79899 Other long term (current) drug therapy: Secondary | ICD-10-CM | POA: Diagnosis not present

## 2018-03-10 LAB — BASIC METABOLIC PANEL
Anion gap: 10 (ref 5–15)
BUN: 19 mg/dL (ref 6–20)
CALCIUM: 8.8 mg/dL — AB (ref 8.9–10.3)
CO2: 29 mmol/L (ref 22–32)
Chloride: 92 mmol/L — ABNORMAL LOW (ref 101–111)
Creatinine, Ser: 0.66 mg/dL (ref 0.44–1.00)
GFR calc Af Amer: >60 mL/min (ref >60)
GFR calc non Af Amer: >60 mL/min (ref >60)
GLUCOSE: 101 mg/dL — AB (ref 65–99)
POTASSIUM: 3.4 mmol/L — AB (ref 3.5–5.1)
Sodium: 131 mmol/L — ABNORMAL LOW (ref 135–145)

## 2018-03-10 MED ORDER — HYDROCODONE-ACETAMINOPHEN 5-325 MG PO TABS
1.0000 | ORAL_TABLET | Freq: Once | ORAL | Status: AC
  Administered 2018-03-10: 1 via ORAL
  Filled 2018-03-10: qty 1

## 2018-03-10 MED ORDER — VALACYCLOVIR HCL 500 MG PO TABS
1000.0000 mg | ORAL_TABLET | Freq: Once | ORAL | Status: AC
  Administered 2018-03-10: 1000 mg via ORAL
  Filled 2018-03-10: qty 2

## 2018-03-10 NOTE — ED Provider Notes (Signed)
[facility] DEPT Provider Note   CSN: [id number] Arrival date & time: 03/10/18  1937     History   Chief Complaint Chief Complaint  Patient presents with  . Rash    HPI   Blood pressure (!) 146/66, pulse 69, temperature 98 F (36.7 C), temperature source Oral, resp. rate 20, height 5' (1.524 m), weight 52.6 kg (116 lb), SpO2 98 %.  Jessica Zavala is a 72 y.o. female with past medical history significant for metastatic breast cancer, followed by Dr. Payton Mccallum complaining of painful rash to left flank area onset 1 week ago, she was visiting family members in [place] and therefore did not seek care, she returned recently and was advised by Dr. Payton Mccallum to be evaluated.  She states that the rash is painful she denies any fevers or chills.  Past Medical History:  Diagnosis Date  . Allergy   . Cancer ([place])    Right side, sp lumpectomy, rxt and chemo  . Eczema of hand 11/15/2013  . HCAP (healthcare-associated pneumonia)   . High cholesterol   . Hyperlipidemia   . Hypertension   . Hyponatremia   . Pneumonia 04/15/2017    Patient Active Problem List   Diagnosis Date Noted  . Metastatic breast cancer ([place])   . Bone metastases ([place]) 02/02/2016  . Breast cancer of lower-outer quadrant of right female breast ([place]) 01/05/2016  . Hypercholesterolemia 06/14/2012  . Hypertension 01/06/2012  . Eczema 01/06/2012    Past Surgical History:  Procedure Laterality Date  . BREAST SURGERY       OB History   None      Home Medications    Prior to Admission medications   Medication Sig Start Date End Date Taking? Authorizing Provider  atorvastatin (LIPITOR) 20 MG tablet Take 1 tablet (20 mg total) by mouth daily. 04/23/17 04/23/18  Doreatha Lew, MD  clobetasol cream (TEMOVATE) 2.54 % Apply 1 application topically 2 (two) times daily as needed. Patient not taking: Reported on 07/11/2017 03/03/17   McVey, Gelene Mink, PA-C  clotrimazole-betamethasone  (LOTRISONE) cream Apply 1 application topically 2 (two) times daily. 07/11/17   McVey, Gelene Mink, PA-C  Flurandrenolide 4 MCG/SQCM TAPE Apply 1 each topically at bedtime as needed and may repeat dose one time if needed. 07/11/17   McVey, Gelene Mink, PA-C  furosemide (LASIX) 20 MG tablet Take 1 tablet (20 mg total) by mouth daily. 04/24/17   Doreatha Lew, MD  HYDROcodone-acetaminophen (NORCO/VICODIN) 5-325 MG tablet Take 1-2 tablets by mouth every 6 hours as needed for pain. 03/11/18   Hiliana Eilts, Elmyra Ricks, PA-C  IBRANCE 75 MG capsule TAKE 1 CAPSULE BY MOUTH DAILY WITH FOOD. TAKE FOR 2 WEEKS ON 2 WEEKS OFF 11/09/17   [name], MD  labetalol (NORMODYNE) 200 MG tablet Take 1 tablet (200 mg total) by mouth 2 (two) times daily. 03/27/17   McVey, Gelene Mink, PA-C  letrozole [facility]) 2.5 MG tablet TAKE 1 TABLET BY MOUTH ONCE DAILY 02/20/18   [name], MD  valACYclovir (VALTREX) 1000 MG tablet Take 1 tablet (1,000 mg total) by mouth 3 (three) times daily for 14 days. 03/11/18 03/25/18  Apolinar Bero, Charna [name]    Family History History reviewed. No pertinent family history.  Social History Social History   Tobacco Use  . Smoking status: Never Smoker  . Smokeless tobacco: Never Used  Substance Use Topics  . Alcohol use: No    Alcohol/week: 0.0 oz  . Drug use: No  Allergies   Aspirin   Review of Systems Review of Systems  A complete review of systems was obtained and all systems are negative except as noted in the HPI and PMH.   Physical Exam Updated Vital Signs BP (!) 146/66 (BP Location: Left Arm)   Pulse 69   Temp 98 F (36.7 C) (Oral)   Resp 20   Ht 5' (1.524 m)   Wt 52.6 kg (116 lb)   SpO2 98%   BMI 22.65 kg/m   Physical Exam  Constitutional: She is oriented to person, place, and time. She appears well-developed and well-nourished. No distress.  HENT:  Head: Normocephalic and atraumatic.  Mouth/Throat: Oropharynx is clear and moist.  Eyes:  Pupils are equal, round, and reactive to light. Conjunctivae and EOM are normal.  Neck: Normal range of motion.  Cardiovascular: Normal rate, regular rhythm and intact distal pulses.  Pulmonary/Chest: Effort normal and breath sounds normal.  Abdominal: Soft. There is no tenderness.  Musculoskeletal: Normal range of motion.  Neurological: She is alert and oriented to person, place, and time.  Skin: Rash noted. She is not diaphoretic.  Confluent blistering in the left T12 distribution with underlying erythema no significant warmth.  Psychiatric: She has a normal mood and affect.  Nursing note and vitals reviewed.    ED Treatments / Results  Labs (all labs ordered are listed, but only abnormal results are displayed) Labs Reviewed  CBC WITH DIFFERENTIAL/PLATELET - Abnormal; Notable for the following components:      Result Value   WBC 2.2 (*)    RBC 3.45 (*)    Hemoglobin 11.4 (*)    HCT 32.9 (*)    Platelets 142 (*)    All other components within normal limits  BASIC METABOLIC PANEL - Abnormal; Notable for the following components:   Sodium 131 (*)    Potassium 3.4 (*)    Chloride 92 (*)    Glucose, Bld 101 (*)    Calcium 8.8 (*)    All other components within normal limits    EKG None  Radiology No results found.  Procedures Procedures (including critical care time)  Medications Ordered in ED Medications  valACYclovir (VALTREX) tablet 1,000 mg (1,000 mg Oral Given 03/10/18 2150)  HYDROcodone-acetaminophen (NORCO/VICODIN) 5-325 MG per tablet 1 tablet (1 tablet Oral Given 03/10/18 2150)     Initial Impression / Assessment and Plan / ED Course  I have reviewed the triage vital signs and the nursing notes.  Pertinent labs & imaging results that were available during my care of the patient were reviewed by me and considered in my medical decision making (see chart for details).     Vitals:   03/10/18 2007 03/10/18 2009  BP: (!) 146/66   Pulse: 69   Resp: 20   Temp:  98 F (36.7 C)   TempSrc: Oral   SpO2: 98%   Weight:  52.6 kg (116 lb)  Height:  5' (1.524 m)    Medications  valACYclovir (VALTREX) tablet 1,000 mg (1,000 mg Oral Given 03/10/18 2150)  HYDROcodone-acetaminophen (NORCO/VICODIN) 5-325 MG per tablet 1 tablet (1 tablet Oral Given 03/10/18 2150)    Diamantina T Mcguiness is 72 y.o. female presenting with painful rash consistent with zoster onset 1 week ago.  Patient has metastatic cancer, has been neutropenic in the past.  Patient with no fever or signs of disseminated infection.  Pending absolute neutrophil count Case signed out to PA Humes at shift change: If [place] is greater than  600 plan is to discharge home with oral medications if it is under 600 consider possible admission, consider discussing with oncology.      Final Clinical Impressions(s) / ED Diagnoses   Final diagnoses:  Herpes zoster without complication    ED Discharge Orders        Ordered    valACYclovir (VALTREX) 1000 MG tablet  3 times daily     03/11/18 0013    HYDROcodone-acetaminophen (NORCO/VICODIN) 5-325 MG tablet     03/11/18 0013       Aadya Kindler, Charna [name] 03/11/18 0018    Lajean Saver, MD 03/11/18 1645

## 2018-03-10 NOTE — ED Triage Notes (Signed)
States rash on left side of chest since April 1 st with burning and pain.

## 2018-03-10 NOTE — ED Notes (Signed)
Bed: BD53 Expected date:  Expected time:  Means of arrival:  Comments: Triage Jessica Zavala

## 2018-03-11 LAB — CBC WITH DIFFERENTIAL/PLATELET
BASOS PCT: 2 %
Basophils Absolute: 0 10*3/uL (ref 0.0–0.1)
EOS PCT: 1 %
Eosinophils Absolute: 0 10*3/uL (ref 0.0–0.7)
HEMATOCRIT: 32.9 % — AB (ref 36.0–46.0)
Hemoglobin: 11.4 g/dL — ABNORMAL LOW (ref 12.0–15.0)
LYMPHS PCT: 70 %
Lymphs Abs: 1.6 10*3/uL (ref 0.7–4.0)
MCH: 33 pg (ref 26.0–34.0)
MCHC: 34.7 g/dL (ref 30.0–36.0)
MCV: 95.4 fL (ref 78.0–100.0)
MONOS PCT: 14 %
Monocytes Absolute: 0.3 10*3/uL (ref 0.1–1.0)
Neutro Abs: 0.3 10*3/uL — ABNORMAL LOW (ref 1.7–7.7)
Neutrophils Relative %: 13 %
Platelets: 142 10*3/uL — ABNORMAL LOW (ref 150–400)
RBC: 3.45 MIL/uL — AB (ref 3.87–5.11)
RDW: 14.8 % (ref 11.5–15.5)
WBC: 2.2 10*3/uL — AB (ref 4.0–10.5)

## 2018-03-11 MED ORDER — VALACYCLOVIR HCL 1 G PO TABS: 1000.0000 mg | ORAL_TABLET | Freq: Three times a day (TID) | ORAL | 0 refills | Status: AC

## 2018-03-11 MED ORDER — HYDROCODONE-ACETAMINOPHEN 5-325 MG PO TABS: ORAL_TABLET | ORAL | 0 refills | Status: DC

## 2018-03-11 NOTE — ED Provider Notes (Signed)
1:53 AM Case discussed with Dr. Alvy Bimler on call for Dr. Lindi Adie.  I have discussed the patient's laboratory testing including leukopenia and neutropenia with rash present for 1 week.  No complaints of fever or chills.  She is currently on Ibrance for treatment of her metastatic breast cancer.  Dr. Alvy Bimler advises that the patient stop this medication until she follows up with Dr. Lindi Adie in clinic.  She is to call in the morning to schedule follow-up for Monday or Tuesday.  Patient was treated in the emergency department with Valtrex prior to discharge.  Return precautions discussed and provided. Patient discharged in stable condition with no unaddressed concerns.  Vitals:   03/10/18 2007 03/10/18 2009  BP: (!) 146/66   Pulse: 69   Resp: 20   Temp: 98 F (36.7 C)   TempSrc: Oral   SpO2: 98%   Weight:  52.6 kg (116 lb)  Height:  5' (1.524 m)      Antonietta Breach, PA-C 03/11/18 0201    Palumbo, April, MD 03/11/18 0221

## 2018-03-11 NOTE — Discharge Instructions (Addendum)
Stop your [facility] until you are able to follow up with Dr. Lindi Adie. Call in the morning to schedule office follow up for Monday or Tuesday. Take Valtrex as prescribed until finished. Take vicodin for breakthrough pain, do not drink alcohol, drive, care for children or do other critical tasks while taking vicodin.

## 2018-03-15 ENCOUNTER — Telehealth: Payer: Self-pay | Admitting: *Deleted

## 2018-03-15 ENCOUNTER — Inpatient Hospital Stay: Payer: Medicare HMO | Attending: Hematology and Oncology

## 2018-03-15 DIAGNOSIS — C50511 Malignant neoplasm of lower-outer quadrant of right female breast: Secondary | ICD-10-CM | POA: Diagnosis not present

## 2018-03-15 DIAGNOSIS — E871 Hypo-osmolality and hyponatremia: Secondary | ICD-10-CM | POA: Diagnosis not present

## 2018-03-15 DIAGNOSIS — C7951 Secondary malignant neoplasm of bone: Secondary | ICD-10-CM | POA: Diagnosis not present

## 2018-03-15 DIAGNOSIS — Z170 Estrogen receptor positive status [ER+]: Secondary | ICD-10-CM

## 2018-03-15 LAB — CBC WITH DIFFERENTIAL (CANCER CENTER ONLY)
BASOS ABS: 0 10*3/uL (ref 0.0–0.1)
BASOS PCT: 1 %
EOS ABS: 0.1 10*3/uL (ref 0.0–0.5)
Eosinophils Relative: 2 %
HEMATOCRIT: 33.8 % — AB (ref 34.8–46.6)
HEMOGLOBIN: 12 g/dL (ref 11.6–15.9)
Lymphocytes Relative: 36 %
Lymphs Abs: 1.3 10*3/uL (ref 0.9–3.3)
MCH: 33.5 pg (ref 25.1–34.0)
MCHC: 35.5 g/dL (ref 31.5–36.0)
MCV: 94.4 fL (ref 79.5–101.0)
Monocytes Absolute: 0.5 10*3/uL (ref 0.1–0.9)
Monocytes Relative: 13 %
NEUTROS ABS: 1.8 10*3/uL (ref 1.5–6.5)
NEUTROS PCT: 48 %
Platelet Count: 217 10*3/uL (ref 145–400)
RBC: 3.58 MIL/uL — ABNORMAL LOW (ref 3.70–5.45)
RDW: 14.3 % (ref 11.2–14.5)
WBC Count: 3.6 10*3/uL — ABNORMAL LOW (ref 3.9–10.3)

## 2018-03-15 LAB — CMP (CANCER CENTER ONLY)
ALK PHOS: 55 U/L (ref 40–150)
ALT: 16 U/L (ref 0–55)
ANION GAP: 8 (ref 3–11)
AST: 24 U/L (ref 5–34)
Albumin: 3.6 g/dL (ref 3.5–5.0)
BILIRUBIN TOTAL: 0.4 mg/dL (ref 0.2–1.2)
BUN: 13 mg/dL (ref 7–26)
CALCIUM: 10 mg/dL (ref 8.4–10.4)
CO2: 28 mmol/L (ref 22–29)
CREATININE: 0.74 mg/dL (ref 0.60–1.10)
Chloride: 88 mmol/L — ABNORMAL LOW (ref 98–109)
Glucose, Bld: 111 mg/dL (ref 70–140)
Potassium: 4 mmol/L (ref 3.5–5.1)
Sodium: 124 mmol/L — ABNORMAL LOW (ref 136–145)
TOTAL PROTEIN: 8.1 g/dL (ref 6.4–8.3)

## 2018-03-15 NOTE — Telephone Encounter (Signed)
This RN spoke with scheduling per pt's son calling stating appointment needed ASAP due to recent ER visit and low blood counts.  Noted ER visit earlier this week with communication with on call MD who recommended for pt to have lab earlier this week.  Plan per above is for pt to come in for lab only today ( provider not available ) and wait for nurse review- if needed pt will be seen by [facility] provider.  Appointment made for lab and visit next available which is on 03/19/2018.

## 2018-03-15 NOTE — Telephone Encounter (Signed)
This RN contacted pt's son [name] per lab results ( pt and son left post lab ).  Informed Roland CBC has recovered well but to not restart the Ibrance at this time.  Noted sodium on the low side with covering MD recommended increase salt intake by diet over the weekend.  Roland verbalized understanding of above and scheduled follow up on 03/19/2018.

## 2018-03-19 ENCOUNTER — Encounter: Payer: Self-pay | Admitting: Adult Health

## 2018-03-19 ENCOUNTER — Inpatient Hospital Stay (HOSPITAL_BASED_OUTPATIENT_CLINIC_OR_DEPARTMENT_OTHER): Payer: Medicare HMO | Admitting: Adult Health

## 2018-03-19 ENCOUNTER — Telehealth: Payer: Self-pay | Admitting: Adult Health

## 2018-03-19 ENCOUNTER — Inpatient Hospital Stay: Payer: Medicare HMO

## 2018-03-19 ENCOUNTER — Telehealth: Payer: Self-pay

## 2018-03-19 VITALS — BP 115/66 | HR 72 | Temp 97.8°F | Resp 16 | Ht 60.0 in | Wt 114.0 lb

## 2018-03-19 DIAGNOSIS — C50511 Malignant neoplasm of lower-outer quadrant of right female breast: Secondary | ICD-10-CM | POA: Diagnosis not present

## 2018-03-19 DIAGNOSIS — E871 Hypo-osmolality and hyponatremia: Secondary | ICD-10-CM

## 2018-03-19 DIAGNOSIS — Z170 Estrogen receptor positive status [ER+]: Secondary | ICD-10-CM | POA: Diagnosis not present

## 2018-03-19 DIAGNOSIS — C7951 Secondary malignant neoplasm of bone: Secondary | ICD-10-CM | POA: Diagnosis not present

## 2018-03-19 DIAGNOSIS — B029 Zoster without complications: Secondary | ICD-10-CM | POA: Diagnosis not present

## 2018-03-19 LAB — CBC WITH DIFFERENTIAL/PLATELET
BASOS ABS: 0 10*3/uL (ref 0.0–0.1)
Basophils Relative: 1 %
EOS ABS: 0.1 10*3/uL (ref 0.0–0.5)
EOS PCT: 1 %
HCT: 32.4 % — ABNORMAL LOW (ref 34.8–46.6)
Hemoglobin: 11.7 g/dL (ref 11.6–15.9)
LYMPHS PCT: 33 %
Lymphs Abs: 1.4 10*3/uL (ref 0.9–3.3)
MCH: 34.1 pg — ABNORMAL HIGH (ref 25.1–34.0)
MCHC: 36.1 g/dL — ABNORMAL HIGH (ref 31.5–36.0)
MCV: 94.5 fL (ref 79.5–101.0)
Monocytes Absolute: 0.8 10*3/uL (ref 0.1–0.9)
Monocytes Relative: 18 %
NEUTROS PCT: 47 %
Neutro Abs: 2.1 10*3/uL (ref 1.5–6.5)
PLATELETS: 260 10*3/uL (ref 145–400)
RBC: 3.43 MIL/uL — AB (ref 3.70–5.45)
RDW: 14.7 % — ABNORMAL HIGH (ref 11.2–14.5)
WBC: 4.3 10*3/uL (ref 3.9–10.3)

## 2018-03-19 LAB — COMPREHENSIVE METABOLIC PANEL
ALT: 22 U/L (ref 0–55)
AST: 29 U/L (ref 5–34)
Albumin: 3.5 g/dL (ref 3.5–5.0)
Alkaline Phosphatase: 60 U/L (ref 40–150)
Anion gap: 7 (ref 3–11)
BILIRUBIN TOTAL: 0.5 mg/dL (ref 0.2–1.2)
BUN: 17 mg/dL (ref 7–26)
CO2: 26 mmol/L (ref 22–29)
CREATININE: 0.67 mg/dL (ref 0.60–1.10)
Calcium: 9.2 mg/dL (ref 8.4–10.4)
Chloride: 88 mmol/L — ABNORMAL LOW (ref 98–109)
GFR calc non Af Amer: >60 mL/min (ref >60)
Glucose, Bld: 98 mg/dL (ref 70–140)
POTASSIUM: 4.8 mmol/L (ref 3.5–5.1)
Sodium: 121 mmol/L — ABNORMAL LOW (ref 136–145)
TOTAL PROTEIN: 7.6 g/dL (ref 6.4–8.3)

## 2018-03-19 NOTE — Telephone Encounter (Signed)
Spoke to patient's son and scheduled appt per 4/15 sch msg

## 2018-03-19 NOTE — Assessment & Plan Note (Addendum)
Metastatic breast cancer PET-CT 01/14/16: Widespread metastatic disease right breast, right chest wall several nodules largest 3.7 cm, mediastinum, right internal mammary lymph node 2.2 cm, right pleural-based nodules 2.1cm and 2.7 cm, malignant pleural effusion, right common iliac lymph node, bone metastases in the thorax (sternum, manubrium, multiple thoracic vertebral bodies, multiple right-sided ribs)  Right chest wall mass biopsy 2:30 position 01/13/2016: Invasive ductal carcinoma focally involves skeletal muscle, grade 2, HER-2 negative ratio 1.08, ER/PR pending (Right breast cancer 1996 status post lumpectomy followed by chemotherapy followed by radiation and tamoxifen 5 years (details are not available)) ---------------------------------------------------------------------------------------------------------------------------------------------------------- Treatment plan: Ibrance with letrozole started 01/20/2016 , cycle 3 Ibrance 100 mg, Cycle 4 Ibrance 75 mg dose; switch to 75 mg 3 weeks on 2 weeks off from 07/22/2016, changed to 2 weeks on 2 weeks off12/12/2015.  Patient reviewed the plan with myself along with Dr. Lindi Adie.  Due to patient's shingles she will not restart Ibrance at this time.  She will hold the ibrance and return in 2 weeks for labs and f/u with Dr. Lindi Adie.    Hyponatremia: Patient's sodium has slowly been decreasing.  After the appointment was completed, her sodium resulted at 121 which was reviewed in detail with Dr. Lindi Adie.  I forwarded this result to Cecille Rubin, and for her to stop taking Lasix, to return on Wednesday for repeat lab work including BMET, urine and serum osmolality, and urine sodium.  I also listed side effects for her to monitor for, and to let us know immediately if she experiences any.  Please see phone note from [facility], LPN.

## 2018-03-19 NOTE — Telephone Encounter (Signed)
Gave avs and calendar ° °

## 2018-03-19 NOTE — Telephone Encounter (Signed)
Spoke with patients son as pt does not speak english about CMP results.  Per Dr. Lindi Adie sodium level is lower and she needs to stop lasix and return on Wed for repeat labs.  Patient should report any symptoms as listed in note to MD immediately.  Pt son voiced understanding and said he would let pt know.  Aware that she needs to return for lab work and that scheduling will reach out to him for appt time.  He knows to call center for any issues/concerns that may arise.

## 2018-03-19 NOTE — Progress Notes (Addendum)
[facility] Follow up:    Jessica Baton, MD [place] [id number]   DIAGNOSIS: Cancer Staging No matching staging information was found for the patient.  SUMMARY OF ONCOLOGIC HISTORY:   Breast cancer of lower-outer quadrant of right female breast ([place])   01/04/1995 Surgery    Right breast cancer status post lumpectomy followed by chemotherapy followed by radiation and tamoxifen 5 years (details are not available)      12/30/2015 Relapse/Recurrence    Chest wall/ soft tissue masses near the right side of the sternum, superior nodule measuring 14.5 mm, inferior nodule measuring 10.5 mm, 26 mm anterior mediastinal soft tissue mass, T9 bone metastases      12/30/2015 Procedure    Thoracentesis: 400 mL removed; showed reactive mesothelial cells, no cancer cells      12/30/2015 Imaging    CT chest: 26 x 17 mm anterior mediastinal soft tissue mass, mixed lytic and sclerotic bone mets mainly involving sternum and T9 vertebral body, scattered vertebral body involvement and rib involvement; chest wall soft tissue masses 14.5 mm, 10.5 mm      01/13/2016 Initial Biopsy    Right chest wall mass biopsy 2:30 position: Invasive ductal carcinoma focally involves skeletal muscle, grade 2, HER-2 negative ratio 1.08, ER/PR 95% positive      01/14/2016 PET scan    Widespread metastatic disease right breast, right chest wall, mediastinum, right internal mammary lymph nodes, right pleural space, malignant pleural effusion, right common iliac lymph node, bone metastases in the thorax      01/21/2016 -  Anti-estrogen oral therapy    Ibrance with letrozole      04/15/2017 - 04/23/2017 Hospital Admission    Pneumonia and confusion       CURRENT THERAPY: Letrozole and Ibrance  INTERVAL HISTORY: Jessica Zavala 72 y.o. female returns for evaluation of her metastatic breast cancer.  She speaks vietnamese.  She is accompanied by her husband and a [place] interpreter,  Jessica Zavala.  She has been doing well on Letrozole and Ibrance.  She unfortunately developed shingles on 03/05/2018 and is having pain in her left lower hip area from the shingles.   She is tolerating the Letrozole well.  She denies arthralgias, vaginal dryness, hot flashes.  She denies fatigue or other issues from the Palbociclib.  She receives [place] every three months, last on 01/05/2018.  She is tolerating this well and has no dental concerns.     Patient Active Problem List   Diagnosis Date Noted  . Metastatic breast cancer ([place])   . Bone metastases ([place]) 02/02/2016  . Breast cancer of lower-outer quadrant of right female breast ([place]) 01/05/2016  . Hypercholesterolemia 06/14/2012  . Hypertension 01/06/2012  . Eczema 01/06/2012    is allergic to aspirin.  MEDICAL HISTORY: Past Medical History:  Diagnosis Date  . Allergy   . Cancer ([place])    Right side, sp lumpectomy, rxt and chemo  . Eczema of hand 11/15/2013  . HCAP (healthcare-associated pneumonia)   . High cholesterol   . Hyperlipidemia   . Hypertension   . Hyponatremia   . Pneumonia 04/15/2017    SURGICAL HISTORY: Past Surgical History:  Procedure Laterality Date  . BREAST SURGERY      SOCIAL HISTORY: Social History   Socioeconomic History  . Marital status: Married    Spouse name: Not on file  . Number of children: Not on file  . Years of education: Not on file  . Highest education  level: Not on file  Occupational History  . Not on file  Social Needs  . Financial resource strain: Not on file  . Food insecurity:    Worry: Not on file    Inability: Not on file  . Transportation needs:    Medical: Not on file    Non-medical: Not on file  Tobacco Use  . Smoking status: Never Smoker  . Smokeless tobacco: Never Used  Substance and Sexual Activity  . Alcohol use: No    Alcohol/week: 0.0 oz  . Drug use: No  . Sexual activity: Yes    Birth control/protection: Post-menopausal  Lifestyle  . Physical activity:     Days per week: Not on file    Minutes per session: Not on file  . Stress: Not on file  Relationships  . Social connections:    Talks on phone: Not on file    Gets together: Not on file    Attends religious service: Not on file    Active member of club or organization: Not on file    Attends meetings of clubs or organizations: Not on file    Relationship status: Not on file  . Intimate partner violence:    Fear of current or ex partner: Not on file    Emotionally abused: Not on file    Physically abused: Not on file    Forced sexual activity: Not on file  Other Topics Concern  . Not on file  Social History Narrative  . Not on file    FAMILY HISTORY: Non Contributory  Review of Systems  Constitutional: Negative for appetite change, chills, fatigue, fever and unexpected weight change.  HENT:   Negative for hearing loss, lump/mass and trouble swallowing.   Eyes: Negative for eye problems and icterus.  Respiratory: Negative for chest tightness, cough and shortness of breath.   Cardiovascular: Negative for chest pain, leg swelling and palpitations.  Gastrointestinal: Negative for abdominal distention, abdominal pain, constipation, diarrhea, nausea and vomiting.  Endocrine: Negative for hot flashes.  Musculoskeletal: Negative for arthralgias.  Skin: Negative for itching and rash.  Neurological: Negative for dizziness, extremity weakness and headaches.  Hematological: Negative for adenopathy. Does not bruise/bleed easily.  Psychiatric/Behavioral: Negative for depression. The patient is not nervous/anxious.       PHYSICAL EXAMINATION  ECOG PERFORMANCE STATUS: 1 - Symptomatic but completely ambulatory  Vitals:   03/19/18 0823  BP: 115/66  Pulse: 72  Resp: 16  Temp: 97.8 F (36.6 C)  SpO2: 99%    Physical Exam  Constitutional: She is oriented to person, place, and time and well-developed, well-nourished, and in no distress.  HENT:  Head: Normocephalic and atraumatic.   Mouth/Throat: Oropharynx is clear and moist. No oropharyngeal exudate.  Eyes: Pupils are equal, round, and reactive to light. No scleral icterus.  Neck: Neck supple.  Cardiovascular: Normal rate, regular rhythm and normal heart sounds.  Pulmonary/Chest: Effort normal and breath sounds normal. No respiratory distress. She has no wheezes. She has no rales.  Abdominal: Soft. Bowel sounds are normal. She exhibits no distension and no mass. There is no tenderness. There is no rebound and no guarding.  Lymphadenopathy:    She has no cervical adenopathy.  Neurological: She is alert and oriented to person, place, and time.  Skin: Skin is warm and dry. No rash noted.  Left d12 dermatome with previous shingles lesions that have scabbed over, slight erythema surrounding lesions  Psychiatric: Mood and affect normal.    LABORATORY DATA:  CBC    Component Value Date/Time   WBC 4.3 03/19/2018 0811   RBC 3.43 (L) 03/19/2018 0811   HGB 11.7 03/19/2018 0811   HGB 12.0 09/28/2017 1031   HCT 32.4 (L) 03/19/2018 0811   HCT 35.3 09/28/2017 1031   PLT 260 03/19/2018 0811   PLT 217 03/15/2018 1101   PLT 234 09/28/2017 1031   MCV 94.5 03/19/2018 0811   MCV 99.4 09/28/2017 1031   MCH 34.1 (H) 03/19/2018 0811   MCHC 36.1 (H) 03/19/2018 0811   RDW 14.7 (H) 03/19/2018 0811   RDW 16.4 (H) 09/28/2017 1031   LYMPHSABS 1.4 03/19/2018 0811   LYMPHSABS 1.0 09/28/2017 1031   MONOABS 0.8 03/19/2018 0811   MONOABS 0.4 09/28/2017 1031   EOSABS 0.1 03/19/2018 0811   EOSABS 0.2 09/28/2017 1031   BASOSABS 0.0 03/19/2018 0811   BASOSABS 0.0 09/28/2017 1031    CMP     Component Value Date/Time   NA 121 (L) 03/19/2018 0811   NA 140 09/28/2017 1031   K 4.8 03/19/2018 0811   K 4.8 09/28/2017 1031   CL 88 (L) 03/19/2018 0811   CO2 26 03/19/2018 0811   CO2 32 (H) 09/28/2017 1031   GLUCOSE 98 03/19/2018 0811   GLUCOSE 94 09/28/2017 1031   BUN 17 03/19/2018 0811   BUN 15.4 09/28/2017 1031   CREATININE 0.67  03/19/2018 0811   CREATININE 0.74 03/15/2018 1101   CREATININE 0.8 09/28/2017 1031   CALCIUM 9.2 03/19/2018 0811   CALCIUM 9.6 09/28/2017 1031   PROT 7.6 03/19/2018 0811   PROT 7.7 09/28/2017 1031   ALBUMIN 3.5 03/19/2018 0811   ALBUMIN 3.7 09/28/2017 1031   AST 29 03/19/2018 0811   AST 24 03/15/2018 1101   AST 24 09/28/2017 1031   ALT 22 03/19/2018 0811   ALT 16 03/15/2018 1101   ALT 16 09/28/2017 1031   ALKPHOS 60 03/19/2018 0811   ALKPHOS 50 09/28/2017 1031   BILITOT 0.5 03/19/2018 0811   BILITOT 0.4 03/15/2018 1101   BILITOT 0.59 09/28/2017 1031   GFRNONAA >60 03/19/2018 0811   GFRNONAA >60 03/15/2018 1101   GFRNONAA 89 10/08/2015 0954   GFRAA >60 03/19/2018 0811   GFRAA >60 03/15/2018 1101   GFRAA >89 10/08/2015 0954      ASSESSMENT and PLAN:   Breast cancer of lower-outer quadrant of right female breast ([place]) Metastatic breast cancer PET-CT 01/14/16: Widespread metastatic disease right breast, right chest wall several nodules largest 3.7 cm, mediastinum, right internal mammary lymph node 2.2 cm, right pleural-based nodules 2.1cm and 2.7 cm, malignant pleural effusion, right common iliac lymph node, bone metastases in the thorax (sternum, manubrium, multiple thoracic vertebral bodies, multiple right-sided ribs)  Right chest wall mass biopsy 2:30 position 01/13/2016: Invasive ductal carcinoma focally involves skeletal muscle, grade 2, HER-2 negative ratio 1.08, ER/PR pending (Right breast cancer 1996 status post lumpectomy followed by chemotherapy followed by radiation and tamoxifen 5 years (details are not available)) ---------------------------------------------------------------------------------------------------------------------------------------------------------- Treatment plan: Ibrance with letrozole started 01/20/2016 , cycle 3 Ibrance 100 mg, Cycle 4 Ibrance 75 mg dose; switch to 75 mg 3 weeks on 2 weeks off from 07/22/2016, changed to 2 weeks on 2 weeks  off12/12/2015.  Patient reviewed the plan with myself along with Dr. Lindi Adie.  Due to patient's shingles she will not restart Ibrance at this time.  She will hold the ibrance and return in 2 weeks for labs and f/u with Dr. Lindi Adie.    Hyponatremia: Patient's sodium has slowly been  decreasing.  After the appointment was completed, her sodium resulted at 121 which was reviewed in detail with Dr. Lindi Adie.  I forwarded this result to Cecille Rubin, and for her to stop taking Lasix, to return on Wednesday for repeat lab work including BMET, urine and serum osmolality, and urine sodium.  I also listed side effects for her to monitor for, and to let us know immediately if she experiences any.  Please see phone note from [facility], LPN.     Orders Placed This Encounter  Procedures  . Basic Metabolic Panel - [place] Only    Standing Status:   Future    Standing Expiration Date:   03/20/2019  . Urine, Sodium    Standing Status:   Future    Standing Expiration Date:   03/20/2019  . Urine, Osmolality    Standing Status:   Future    Standing Expiration Date:   03/20/2019  . Osmolality    Standing Status:   Future    Standing Expiration Date:   03/20/2019    All questions were answered. The patient knows to call the clinic with any problems, questions or concerns. We can certainly see the patient much sooner if necessary.  A total of (30) minutes of face-to-face time was spent with this patient with greater than 50% of that time in counseling and care-coordination.  This note was electronically signed. Scot Dock, NP 03/19/2018   Attending Note  I personally saw and examined Jessica Zavala. The plan of care was discussed with her. I agree with the assessment and plan as documented above. Extremely severe case of shingles with skin excoriation: It appears to be drying out.  She had completed Valtrex therapy. Severe hyponatremia: Probably SIADH: Will send for urine sodium and osmolarity as well as  serum osmolality.  She will also need TSH and serum cortisol checked She will come back on Wednesday for recheck of her blood counts as well as  sodium level. Patient will hold palbociclib until we instructed her to resume it. Signed Harriette Ohara, MD

## 2018-03-19 NOTE — Telephone Encounter (Signed)
-----   Message from Gardenia Phlegm, NP sent at 03/19/2018  9:10 AM EDT ----- Please call patient.  She speaks [place] only.  Her sodium is lower and Dr. Lindi Adie reviewed her labs.  She needs to stop her lasix and return on Wednesday for repeat labs.  She needs to report symptoms such as headache, nausea, vomiting, dizziness, gait issues, confusions and muscle cramps immediately.    Thanks,  Mendel Ryder ----- Message ----- From: Buel Ream, [facility] [place] Sent: 03/19/2018   8:22 AM To: [name], MD

## 2018-03-21 ENCOUNTER — Inpatient Hospital Stay: Payer: Medicare HMO

## 2018-03-21 DIAGNOSIS — E871 Hypo-osmolality and hyponatremia: Secondary | ICD-10-CM

## 2018-03-21 DIAGNOSIS — C7951 Secondary malignant neoplasm of bone: Secondary | ICD-10-CM | POA: Diagnosis not present

## 2018-03-21 DIAGNOSIS — Z170 Estrogen receptor positive status [ER+]: Secondary | ICD-10-CM

## 2018-03-21 DIAGNOSIS — C50511 Malignant neoplasm of lower-outer quadrant of right female breast: Secondary | ICD-10-CM | POA: Diagnosis not present

## 2018-03-21 LAB — OSMOLALITY: Osmolality: 266 mOsm/kg — ABNORMAL LOW (ref 275–295)

## 2018-03-21 LAB — BASIC METABOLIC PANEL - CANCER CENTER ONLY
ANION GAP: 7 (ref 3–11)
BUN: 15 mg/dL (ref 7–26)
CHLORIDE: 87 mmol/L — AB (ref 98–109)
CO2: 31 mmol/L — ABNORMAL HIGH (ref 22–29)
Calcium: 9.4 mg/dL (ref 8.4–10.4)
Creatinine: 0.7 mg/dL (ref 0.60–1.10)
GFR, Est AFR Am: >60 mL/min (ref >60)
Glucose, Bld: 97 mg/dL (ref 70–140)
Potassium: 4.4 mmol/L (ref 3.5–5.1)
SODIUM: 125 mmol/L — AB (ref 136–145)

## 2018-03-21 LAB — CBC WITH DIFFERENTIAL/PLATELET
BASOS ABS: 0 10*3/uL (ref 0.0–0.1)
Basophils Relative: 1 %
Eosinophils Absolute: 0.1 10*3/uL (ref 0.0–0.5)
Eosinophils Relative: 2 %
HEMATOCRIT: 35.2 % (ref 34.8–46.6)
HEMOGLOBIN: 12.5 g/dL (ref 11.6–15.9)
LYMPHS ABS: 1.6 10*3/uL (ref 0.9–3.3)
Lymphocytes Relative: 36 %
MCH: 34 pg (ref 25.1–34.0)
MCHC: 35.5 g/dL (ref 31.5–36.0)
MCV: 95.7 fL (ref 79.5–101.0)
Monocytes Absolute: 0.5 10*3/uL (ref 0.1–0.9)
Monocytes Relative: 11 %
NEUTROS ABS: 2.3 10*3/uL (ref 1.5–6.5)
Neutrophils Relative %: 50 %
Platelets: 327 10*3/uL (ref 145–400)
RBC: 3.68 MIL/uL — AB (ref 3.70–5.45)
RDW: 14.9 % — ABNORMAL HIGH (ref 11.2–14.5)
WBC: 4.6 10*3/uL (ref 3.9–10.3)

## 2018-03-21 LAB — SODIUM, URINE, RANDOM: SODIUM UR: 94 mmol/L

## 2018-03-21 LAB — OSMOLALITY, URINE: Osmolality, Ur: 263 mOsm/kg — ABNORMAL LOW (ref 300–900)

## 2018-04-06 ENCOUNTER — Inpatient Hospital Stay: Payer: Medicare HMO

## 2018-04-06 ENCOUNTER — Telehealth: Payer: Self-pay | Admitting: Hematology and Oncology

## 2018-04-06 ENCOUNTER — Inpatient Hospital Stay: Payer: Medicare HMO | Attending: Hematology and Oncology | Admitting: Hematology and Oncology

## 2018-04-06 ENCOUNTER — Other Ambulatory Visit: Payer: Self-pay | Admitting: Pharmacist

## 2018-04-06 DIAGNOSIS — B029 Zoster without complications: Secondary | ICD-10-CM | POA: Diagnosis not present

## 2018-04-06 DIAGNOSIS — E871 Hypo-osmolality and hyponatremia: Secondary | ICD-10-CM | POA: Diagnosis not present

## 2018-04-06 DIAGNOSIS — C50511 Malignant neoplasm of lower-outer quadrant of right female breast: Secondary | ICD-10-CM

## 2018-04-06 DIAGNOSIS — Z170 Estrogen receptor positive status [ER+]: Secondary | ICD-10-CM

## 2018-04-06 DIAGNOSIS — C7951 Secondary malignant neoplasm of bone: Secondary | ICD-10-CM

## 2018-04-06 DIAGNOSIS — C50919 Malignant neoplasm of unspecified site of unspecified female breast: Secondary | ICD-10-CM

## 2018-04-06 LAB — COMPREHENSIVE METABOLIC PANEL
ALT: 23 U/L (ref 0–55)
AST: 25 U/L (ref 5–34)
Albumin: 3.8 g/dL (ref 3.5–5.0)
Alkaline Phosphatase: 57 U/L (ref 40–150)
Anion gap: 7 (ref 3–11)
BILIRUBIN TOTAL: 0.6 mg/dL (ref 0.2–1.2)
BUN: 18 mg/dL (ref 7–26)
CO2: 28 mmol/L (ref 22–29)
Calcium: 9.2 mg/dL (ref 8.4–10.4)
Chloride: 100 mmol/L (ref 98–109)
Creatinine, Ser: 0.71 mg/dL (ref 0.60–1.10)
GFR calc Af Amer: >60 mL/min (ref >60)
Glucose, Bld: 91 mg/dL (ref 70–140)
POTASSIUM: 4.2 mmol/L (ref 3.5–5.1)
Sodium: 135 mmol/L — ABNORMAL LOW (ref 136–145)
TOTAL PROTEIN: 7.6 g/dL (ref 6.4–8.3)

## 2018-04-06 LAB — CBC WITH DIFFERENTIAL/PLATELET
BASOS ABS: 0 10*3/uL (ref 0.0–0.1)
Basophils Relative: 0 %
Eosinophils Absolute: 0.3 10*3/uL (ref 0.0–0.5)
Eosinophils Relative: 7 %
HEMATOCRIT: 35.3 % (ref 34.8–46.6)
Hemoglobin: 12 g/dL (ref 11.6–15.9)
LYMPHS ABS: 1.5 10*3/uL (ref 0.9–3.3)
LYMPHS PCT: 34 %
MCH: 34.3 pg — ABNORMAL HIGH (ref 25.1–34.0)
MCHC: 34 g/dL (ref 31.5–36.0)
MCV: 100.9 fL (ref 79.5–101.0)
MONO ABS: 0.5 10*3/uL (ref 0.1–0.9)
MONOS PCT: 11 %
NEUTROS ABS: 2.1 10*3/uL (ref 1.5–6.5)
Neutrophils Relative %: 48 %
Platelets: 272 10*3/uL (ref 145–400)
RBC: 3.5 MIL/uL — ABNORMAL LOW (ref 3.70–5.45)
RDW: 15.8 % — AB (ref 11.2–14.5)
WBC: 4.3 10*3/uL (ref 3.9–10.3)

## 2018-04-06 MED ORDER — DENOSUMAB 120 MG/1.7ML ~~LOC~~ SOLN
120.0000 mg | Freq: Once | SUBCUTANEOUS | Status: AC
  Administered 2018-04-06: 120 mg via SUBCUTANEOUS

## 2018-04-06 MED ORDER — DENOSUMAB 120 MG/1.7ML ~~LOC~~ SOLN
SUBCUTANEOUS | Status: AC
  Filled 2018-04-06: qty 1.7

## 2018-04-06 NOTE — Assessment & Plan Note (Signed)
Metastatic breast cancer PET-CT 01/14/16: Widespread metastatic disease right breast, right chest wall several nodules largest 3.7 cm, mediastinum, right internal mammary lymph node 2.2 cm, right pleural-based nodules 2.1cm and 2.7 cm, malignant pleural effusion, right common iliac lymph node, bone metastases in the thorax (sternum, manubrium, multiple thoracic vertebral bodies, multiple right-sided ribs)  Right chest wall mass biopsy 2:30 position 01/13/2016: Invasive ductal carcinoma focally involves skeletal muscle, grade 2, HER-2 negative ratio 1.08, ER/PR pending (Right breast cancer 1996 status post lumpectomy followed by chemotherapy followed by radiation and tamoxifen 5 years (details are not available)) ---------------------------------------------------------------------------------------------------------------------------------------------------------- Treatment plan: Ibrance with letrozole started 01/20/2016 , cycle 3 Ibrance 100 mg, Cycle 4 Ibrance 75 mg dose; switch to 75 mg 3 weeks on 2 weeks off from 07/22/2016, changed to 2 weeks on 2 weeks off12/12/2015.  Shingles: Leslee Home has been on hold Shingles appears to have improved.  Patient will resume Ibrance today.  Hyponatremia: Suspected to be related to SIADH. Patient will come back in 3 months with labs and follow-up

## 2018-04-06 NOTE — Progress Notes (Signed)
Patient Care Team: [name], MD as PCP - General (Internal Medicine) [name], MD as Consulting Physician (Hematology and Oncology)  DIAGNOSIS:  Encounter Diagnosis  Name Primary?  . Malignant neoplasm of lower-outer quadrant of right breast of female, estrogen receptor positive ([place])     SUMMARY OF ONCOLOGIC HISTORY:   Breast cancer of lower-outer quadrant of right female breast ([place])   01/04/1995 Surgery    Right breast cancer status post lumpectomy followed by chemotherapy followed by radiation and tamoxifen 5 years (details are not available)      12/30/2015 Relapse/Recurrence    Chest wall/ soft tissue masses near the right side of the sternum, superior nodule measuring 14.5 mm, inferior nodule measuring 10.5 mm, 26 mm anterior mediastinal soft tissue mass, T9 bone metastases      12/30/2015 Procedure    Thoracentesis: 400 mL removed; showed reactive mesothelial cells, no cancer cells      12/30/2015 Imaging    CT chest: 26 x 17 mm anterior mediastinal soft tissue mass, mixed lytic and sclerotic bone mets mainly involving sternum and T9 vertebral body, scattered vertebral body involvement and rib involvement; chest wall soft tissue masses 14.5 mm, 10.5 mm      01/13/2016 Initial Biopsy    Right chest wall mass biopsy 2:30 position: Invasive ductal carcinoma focally involves skeletal muscle, grade 2, HER-2 negative ratio 1.08, ER/PR 95% positive      01/14/2016 PET scan    Widespread metastatic disease right breast, right chest wall, mediastinum, right internal mammary lymph nodes, right pleural space, malignant pleural effusion, right common iliac lymph node, bone metastases in the thorax      01/21/2016 -  Anti-estrogen oral therapy    Ibrance with letrozole      04/15/2017 - 04/23/2017 Hospital Admission    Pneumonia and confusion       CHIEF COMPLIANT: Follow-up on shingles, Ibrance with letrozole and  INTERVAL HISTORY: Jessica Zavala is a 72 year old with  above-mentioned history of metastatic breast cancer currently on Ibrance with letrozole.  Her treatment has been on hold because of shingles.  Shingles appears to have improved.  So she is here today to discuss restarting Ibrance.  REVIEW OF SYSTEMS:   Constitutional: Denies fevers, chills or abnormal weight loss Eyes: Denies blurriness of vision Ears, nose, mouth, throat, and face: Denies mucositis or sore throat Respiratory: Denies cough, dyspnea or wheezes Cardiovascular: Denies palpitation, chest discomfort Gastrointestinal:  Denies nausea, heartburn or change in bowel habits Skin: Denies abnormal skin rashes Lymphatics: Denies new lymphadenopathy or easy bruising Neurological:Denies numbness, tingling or new weaknesses Behavioral/Psych: Mood is stable, no new changes  Extremities: No lower extremity edema  All other systems were reviewed with the patient and are negative.  I have reviewed the past medical history, past surgical history, social history and family history with the patient and they are unchanged from previous note.  ALLERGIES:  is allergic to aspirin.  MEDICATIONS:  Current Outpatient Medications  Medication Sig Dispense Refill  . atorvastatin (LIPITOR) 20 MG tablet Take 1 tablet (20 mg total) by mouth daily. 30 tablet 11  . clobetasol cream (TEMOVATE) 1.61 % Apply 1 application topically 2 (two) times daily as needed. 30 g 0  . clotrimazole-betamethasone (LOTRISONE) cream Apply 1 application topically 2 (two) times daily. (Patient taking differently: Apply 1 application topically 2 (two) times daily as needed (skin irritation). ) 45 g 1  . Flurandrenolide 4 MCG/SQCM TAPE Apply 1 each topically at bedtime as needed  and may repeat dose one time if needed. 1 each 1  . furosemide (LASIX) 20 MG tablet Take 1 tablet (20 mg total) by mouth daily. 30 tablet 0  . HYDROcodone-acetaminophen (NORCO/VICODIN) 5-325 MG tablet Take 1-2 tablets by mouth every 6 hours as needed for  pain. 17 tablet 0  . IBRANCE 75 MG capsule TAKE 1 CAPSULE BY MOUTH DAILY WITH FOOD. TAKE FOR 2 WEEKS ON 2 WEEKS OFF 14 capsule 6  . labetalol (NORMODYNE) 200 MG tablet Take 1 tablet (200 mg total) by mouth 2 (two) times daily. 30 tablet 1  . letrozole (FEMARA) 2.5 MG tablet TAKE 1 TABLET BY MOUTH ONCE DAILY 90 tablet 3   No current facility-administered medications for this visit.     PHYSICAL EXAMINATION: ECOG PERFORMANCE STATUS: 1 - Symptomatic but completely ambulatory  Vitals:   04/06/18 0900  BP: 140/70  Pulse: 82  Resp: 18  Temp: 98 F (36.7 C)  SpO2: 99%   Filed Weights   04/06/18 0900  Weight: 112 lb 1.6 oz (50.8 kg)    GENERAL:alert, no distress and comfortable SKIN: Shingles lesions have improved significantly EYES: normal, Conjunctiva are pink and non-injected, sclera clear OROPHARYNX:no exudate, no erythema and lips, buccal mucosa, and tongue normal  NECK: supple, thyroid normal size, non-tender, without nodularity LYMPH:  no palpable lymphadenopathy in the cervical, axillary or inguinal LUNGS: clear to auscultation and percussion with normal breathing effort HEART: regular rate & rhythm and no murmurs and no lower extremity edema ABDOMEN:abdomen soft, non-tender and normal bowel sounds MUSCULOSKELETAL:no cyanosis of digits and no clubbing  NEURO: alert & oriented x 3 with fluent speech, no focal motor/sensory deficits EXTREMITIES: No lower extremity edema    LABORATORY DATA:  I have reviewed the data as listed CMP Latest Ref Rng & Units 03/21/2018 03/19/2018 03/15/2018  Glucose 70 - 140 mg/dL 97 98 111  BUN 7 - 26 mg/dL '15 17 13  ' Creatinine 0.60 - 1.10 mg/dL 0.70 0.67 0.74  Sodium 136 - 145 mmol/L 125(L) 121(L) 124(L)  Potassium 3.5 - 5.1 mmol/L 4.4 4.8 4.0  Chloride 98 - 109 mmol/L 87(L) 88(L) 88(L)  CO2 22 - 29 mmol/L 31(H) 26 28  Calcium 8.4 - 10.4 mg/dL 9.4 9.2 10.0  Total Protein 6.4 - 8.3 g/dL - 7.6 8.1  Total Bilirubin 0.2 - 1.2 mg/dL - 0.5 0.4    Alkaline Phos 40 - 150 U/L - 60 55  AST 5 - 34 U/L - 29 24  ALT 0 - 55 U/L - 22 16    Lab Results  Component Value Date   WBC 4.3 04/06/2018   HGB 12.0 04/06/2018   HCT 35.3 04/06/2018   MCV 100.9 04/06/2018   PLT 272 04/06/2018   NEUTROABS 2.1 04/06/2018    ASSESSMENT & PLAN:  Breast cancer of lower-outer quadrant of right female breast ([place]) Metastatic breast cancer PET-CT 01/14/16: Widespread metastatic disease right breast, right chest wall several nodules largest 3.7 cm, mediastinum, right internal mammary lymph node 2.2 cm, right pleural-based nodules 2.1cm and 2.7 cm, malignant pleural effusion, right common iliac lymph node, bone metastases in the thorax (sternum, manubrium, multiple thoracic vertebral bodies, multiple right-sided ribs)  Right chest wall mass biopsy 2:30 position 01/13/2016: Invasive ductal carcinoma focally involves skeletal muscle, grade 2, HER-2 negative ratio 1.08, ER/PR pending (Right breast cancer 1996 status post lumpectomy followed by chemotherapy followed by radiation and tamoxifen 5 years (details are not available)) ---------------------------------------------------------------------------------------------------------------------------------------------------------- Treatment plan: Ibrance with letrozole started 01/20/2016 ,  cycle 3 Ibrance 100 mg, Cycle 4 Ibrance 75 mg dose; switch to 75 mg 3 weeks on 2 weeks off from 07/22/2016, changed to 2 weeks on 2 weeks off12/12/2015.  Shingles: Leslee Home has been on hold Shingles appears to have improved.  Patient will resume Ibrance today.  Hyponatremia: Suspected to be related to SIADH. Patient will come back in 3 months with labs, scans and follow-up     Orders Placed This Encounter  Procedures  . CT Abdomen Pelvis W Contrast    Standing Status:   Future    Standing Expiration Date:   04/06/2019    Order Specific Question:   If indicated for the ordered procedure, I authorize the administration  of contrast media per Radiology protocol    Answer:   Yes    Order Specific Question:   Preferred imaging location?    Answer:   [facility]    Order Specific Question:   Is Oral Contrast requested for this exam?    Answer:   Yes, Per Radiology protocol    Order Specific Question:   Radiology Contrast Protocol - do NOT remove file path    Answer:   [email or web address]    Order Specific Question:   Reason for Exam additional comments    Answer:   Metastatic breast cancer restaging  . CT Chest W Contrast    Standing Status:   Future    Standing Expiration Date:   04/06/2019    Order Specific Question:   If indicated for the ordered procedure, I authorize the administration of contrast media per Radiology protocol    Answer:   Yes    Order Specific Question:   Preferred imaging location?    Answer:   [facility]    Order Specific Question:   Radiology Contrast Protocol - do NOT remove file path    Answer:   [email or web address]    Order Specific Question:   Reason for Exam additional comments    Answer:   Metastatic breast cancer restaging  . CBC with Differential (Cancer Center Only)    Standing Status:   Future    Standing Expiration Date:   04/07/2019  . CMP ([place] only)    Standing Status:   Future    Standing Expiration Date:   04/07/2019   The patient has a good understanding of the overall plan. she agrees with it. she will call with any problems that may develop before the next visit here.   Harriette Ohara, MD 04/06/18

## 2018-04-06 NOTE — Telephone Encounter (Signed)
Gave patient AVs and calendar of upcoming august appointments.  °

## 2018-05-01 ENCOUNTER — Encounter: Payer: Self-pay | Admitting: Physician Assistant

## 2018-05-01 ENCOUNTER — Ambulatory Visit (INDEPENDENT_AMBULATORY_CARE_PROVIDER_SITE_OTHER): Payer: Medicare HMO | Admitting: Physician Assistant

## 2018-05-01 VITALS — BP 144/88 | HR 78 | Temp 98.2°F | Resp 17 | Ht 60.0 in | Wt 116.0 lb

## 2018-05-01 DIAGNOSIS — R21 Rash and other nonspecific skin eruption: Secondary | ICD-10-CM | POA: Diagnosis not present

## 2018-05-01 DIAGNOSIS — L309 Dermatitis, unspecified: Secondary | ICD-10-CM

## 2018-05-01 MED ORDER — METHYLPREDNISOLONE ACETATE 80 MG/ML IJ SUSP
80.0000 mg | Freq: Once | INTRAMUSCULAR | Status: AC
  Administered 2018-05-01: 80 mg via INTRAMUSCULAR

## 2018-05-01 MED ORDER — CLOTRIMAZOLE-BETAMETHASONE 1-0.05 % EX CREA: 1.0000 "application " | TOPICAL_CREAM | Freq: Two times a day (BID) | CUTANEOUS | 0 refills | Status: AC | PRN

## 2018-05-01 NOTE — Patient Instructions (Addendum)
You will receive a phone call to schedule an appointment with dermatology (they will call Roland).  Keep your hand elevated above the level of your heart.  Be sure to moisturize the skin regularly and drink plenty of water.   Thank you for coming in today. I hope you feel we met your needs.  Feel free to call PCP if you have any questions or further requests.  Please consider signing up for MyChart if you do not already have it, as this is a great way to communicate with me.  Best,  Whitney McVey, PA-C  IF you received an x-ray today, you will receive an invoice from [facility]. Please contact [facility] Radiology at [phone] with questions or concerns regarding your invoice.   IF you received labwork today, you will receive an invoice from [facility]. Please contact [facility] at [phone] with questions or concerns regarding your invoice.   Our billing staff will not be able to assist you with questions regarding bills from these companies.  You will be contacted with the lab results as soon as they are available. The fastest way to get your results is to activate your My Chart account. Instructions are located on the last page of this paperwork. If you have not heard from [place] regarding the results in 2 weeks, please contact this office.

## 2018-05-01 NOTE — Progress Notes (Signed)
Jessica Zavala  MRN: [id number] DOB: 11/03/1946  PCP: [name], MD  Subjective:  Pt is a pleasant 72 year old female who presents to clinic for itching hands. She speaks [place], interpreter is here with her today.  This has been a problem for her for x several years. Worsening symptoms the past 2 weeks. Endorses swelling of her right hand and itching of both hands.  She uses clotrimazole and betamethasone disproprionate 1%/0.05% - not helping.   Last OV for this problem was 07/2017. She was treated with IM depomedrol and flurandrenolide. She has been to derm for this > 5 years ago.   Of note, she is currently being treated for widespread metastatic breast cancer, currently on Ibrance with letrozole.   Review of Systems  Constitutional: Negative for diaphoresis and fatigue.  Gastrointestinal: Negative for abdominal pain, nausea and vomiting.  Musculoskeletal: Negative for arthralgias and joint swelling.  Skin: Positive for rash.  Neurological: Negative for weakness and numbness.    Patient Active Problem List   Diagnosis Date Noted  . Metastatic breast cancer ([place])   . Bone metastases ([place]) 02/02/2016  . Breast cancer of lower-outer quadrant of right female breast ([place]) 01/05/2016  . Hypercholesterolemia 06/14/2012  . Hypertension 01/06/2012  . Eczema 01/06/2012    Current Outpatient Medications on File Prior to Visit  Medication Sig Dispense Refill  . atorvastatin (LIPITOR) 20 MG tablet Take 1 tablet (20 mg total) by mouth daily. 30 tablet 11  . clobetasol cream (TEMOVATE) 6.65 % Apply 1 application topically 2 (two) times daily as needed. 30 g 0  . clotrimazole-betamethasone (LOTRISONE) cream Apply 1 application topically 2 (two) times daily. (Patient taking differently: Apply 1 application topically 2 (two) times daily as needed (skin irritation). ) 45 g 1  . Flurandrenolide 4 MCG/SQCM TAPE Apply 1 each topically at bedtime as needed and may repeat dose one time if  needed. 1 each 1  . furosemide (LASIX) 20 MG tablet Take 1 tablet (20 mg total) by mouth daily. 30 tablet 0  . IBRANCE 75 MG capsule TAKE 1 CAPSULE BY MOUTH DAILY WITH FOOD. TAKE FOR 2 WEEKS ON 2 WEEKS OFF 14 capsule 6  . labetalol (NORMODYNE) 200 MG tablet Take 1 tablet (200 mg total) by mouth 2 (two) times daily. 30 tablet 1  . letrozole (FEMARA) 2.5 MG tablet TAKE 1 TABLET BY MOUTH ONCE DAILY 90 tablet 3   No current facility-administered medications on file prior to visit.     Allergies  Allergen Reactions  . Aspirin Swelling    Mouth and facial swelling     Objective:  BP (!) 144/88   Pulse 78   Temp 98.2 F (36.8 C) (Oral)   Resp 17   Ht 5' (1.524 m)   Wt 116 lb (52.6 kg)   SpO2 98%   BMI 22.65 kg/m   Physical Exam  Constitutional: She is oriented to person, place, and time. No distress.  Neurological: She is alert and oriented to person, place, and time.  Skin: Skin is warm and dry. Rash noted.  Excoriated,nodular lesions b/l dorsal forearms and hands. Right hand is edematous.   Psychiatric: Judgment normal.  Vitals reviewed.      Assessment and Plan :  1. Rash and nonspecific skin eruption 2. Eczema of both hands - methylPREDNISolone acetate (DEPO-MEDROL) injection 80 mg - Ambulatory referral to Dermatology - clotrimazole-betamethasone (LOTRISONE) cream; Apply 1 application topically 2 (two) times daily as needed (skin irritation).  Dispense:  45 g; Refill: 0 - Pt appears for acute on chronic itching and rash b/l hands, with edema of right hand. Plan to refer to dermatology as this seems to be a recurring problem for her, with this episode being her worst. IM DepoMedrol administered today by CMA.   Mercer Pod, PA-C  [facility] 05/01/2018 5:48 PM

## 2018-05-25 DIAGNOSIS — I10 Essential (primary) hypertension: Secondary | ICD-10-CM | POA: Diagnosis not present

## 2018-05-25 DIAGNOSIS — R82998 Other abnormal findings in urine: Secondary | ICD-10-CM | POA: Diagnosis not present

## 2018-05-25 DIAGNOSIS — M859 Disorder of bone density and structure, unspecified: Secondary | ICD-10-CM | POA: Diagnosis not present

## 2018-05-25 DIAGNOSIS — E7849 Other hyperlipidemia: Secondary | ICD-10-CM | POA: Diagnosis not present

## 2018-06-01 DIAGNOSIS — E871 Hypo-osmolality and hyponatremia: Secondary | ICD-10-CM | POA: Diagnosis not present

## 2018-06-01 DIAGNOSIS — Z23 Encounter for immunization: Secondary | ICD-10-CM | POA: Diagnosis not present

## 2018-06-01 DIAGNOSIS — L409 Psoriasis, unspecified: Secondary | ICD-10-CM | POA: Diagnosis not present

## 2018-06-01 DIAGNOSIS — I700 Atherosclerosis of aorta: Secondary | ICD-10-CM | POA: Diagnosis not present

## 2018-06-01 DIAGNOSIS — D6481 Anemia due to antineoplastic chemotherapy: Secondary | ICD-10-CM | POA: Diagnosis not present

## 2018-06-01 DIAGNOSIS — D702 Other drug-induced agranulocytosis: Secondary | ICD-10-CM | POA: Diagnosis not present

## 2018-06-01 DIAGNOSIS — Z0000 Encounter for general adult medical examination without abnormal findings: Secondary | ICD-10-CM | POA: Diagnosis not present

## 2018-06-01 DIAGNOSIS — C50511 Malignant neoplasm of lower-outer quadrant of right female breast: Secondary | ICD-10-CM | POA: Diagnosis not present

## 2018-06-01 DIAGNOSIS — M859 Disorder of bone density and structure, unspecified: Secondary | ICD-10-CM | POA: Diagnosis not present

## 2018-06-01 DIAGNOSIS — D6489 Other specified anemias: Secondary | ICD-10-CM | POA: Diagnosis not present

## 2018-06-11 DIAGNOSIS — Z1212 Encounter for screening for malignant neoplasm of rectum: Secondary | ICD-10-CM | POA: Diagnosis not present

## 2018-06-22 ENCOUNTER — Telehealth: Payer: Self-pay | Admitting: Hematology and Oncology

## 2018-06-22 NOTE — Telephone Encounter (Signed)
Patients son called to reschedule as she would be out of town

## 2018-06-28 ENCOUNTER — Telehealth: Payer: Self-pay | Admitting: Hematology and Oncology

## 2018-06-28 NOTE — Telephone Encounter (Signed)
Pt son aware of appt added for labs before ct per sch message .

## 2018-07-13 ENCOUNTER — Other Ambulatory Visit: Payer: Medicare HMO

## 2018-07-13 ENCOUNTER — Ambulatory Visit: Payer: Medicare HMO | Admitting: Hematology and Oncology

## 2018-08-01 ENCOUNTER — Other Ambulatory Visit: Payer: Self-pay

## 2018-08-01 ENCOUNTER — Encounter: Payer: Self-pay | Admitting: Physician Assistant

## 2018-08-01 ENCOUNTER — Ambulatory Visit (INDEPENDENT_AMBULATORY_CARE_PROVIDER_SITE_OTHER): Payer: Medicare HMO | Admitting: Physician Assistant

## 2018-08-01 VITALS — BP 143/72 | HR 80 | Temp 98.1°F | Resp 18 | Ht 60.0 in | Wt 118.2 lb

## 2018-08-01 DIAGNOSIS — L03114 Cellulitis of left upper limb: Secondary | ICD-10-CM

## 2018-08-01 DIAGNOSIS — Z23 Encounter for immunization: Secondary | ICD-10-CM | POA: Diagnosis not present

## 2018-08-01 MED ORDER — CEPHALEXIN 500 MG PO CAPS: 500.0000 mg | ORAL_CAPSULE | Freq: Three times a day (TID) | ORAL | 0 refills | Status: DC

## 2018-08-01 NOTE — Patient Instructions (Addendum)
Try to keep the hand elevated and don't work like this, as this will make the infection spread more easily.     Come back tomorrow to have your hand checked again to make sure you are not getting worse.   If you have lab work done today you will be contacted with your lab results within the next 2 weeks.  If you have not heard from [place] then please contact us. The fastest way to get your results is to register for My Chart.   IF you received an x-ray today, you will receive an invoice from [facility] Radiology. Please contact [facility] Radiology at [phone] with questions or concerns regarding your invoice.   IF you received labwork today, you will receive an invoice from [place]. Please contact [facility] at [phone] with questions or concerns regarding your invoice.   Our billing staff will not be able to assist you with questions regarding bills from these companies.  You will be contacted with the lab results as soon as they are available. The fastest way to get your results is to activate your My Chart account. Instructions are located on the last page of this paperwork. If you have not heard from [place] regarding the results in 2 weeks, please contact this office.

## 2018-08-01 NOTE — Progress Notes (Signed)
08/01/2018 3:31 PM   DOB: 01/06/1946 / MRN: [id number]  SUBJECTIVE:  Jessica Zavala is a 72 y.o. female presenting for right hand pain that started after plucking the fruit of the [place] tree which has a small thorn. Symptoms present for about 36 hours now.  The problem is getting worse. She has tried nothing. Denies a loss of function about the hand, fever, chills, nausea.   She is allergic to aspirin.   She  has a past medical history of Allergy, Cancer ([place]), Eczema of hand (11/15/2013), HCAP (healthcare-associated pneumonia), High cholesterol, Hyperlipidemia, Hypertension, Hyponatremia, and Pneumonia (04/15/2017).    She  reports that she has never smoked. She has never used smokeless tobacco. She reports that she does not drink alcohol or use drugs. She  reports that she currently engages in sexual activity. She reports using the following method of birth control/protection: Post-menopausal. The patient  has a past surgical history that includes Breast surgery.  Her family history is not on file.  Review of Systems  Constitutional: Negative for chills, fever and malaise/fatigue.  Cardiovascular: Negative for chest pain.  Musculoskeletal: Positive for joint pain. Negative for back pain, falls, myalgias and neck pain.  Skin: Negative for itching and rash.  Neurological: Negative for dizziness.    The problem list and medications were reviewed and updated by myself where necessary and exist elsewhere in the encounter.   OBJECTIVE:  BP (!) 143/72   Pulse 80   Temp 98.1 F (36.7 C) (Oral)   Resp 18   Ht 5' (1.524 m)   Wt 118 lb 3.2 oz (53.6 kg)   SpO2 95%   BMI 23.08 kg/m   Wt Readings from Last 3 Encounters:  08/01/18 118 lb 3.2 oz (53.6 kg)  05/01/18 116 lb (52.6 kg)  04/06/18 112 lb 1.6 oz (50.8 kg)   Temp Readings from Last 3 Encounters:  08/01/18 98.1 F (36.7 C) (Oral)  05/01/18 98.2 F (36.8 C) (Oral)  04/06/18 98 F (36.7 C) (Oral)   BP Readings from Last 3  Encounters:  08/01/18 (!) 143/72  05/01/18 (!) 144/88  04/06/18 140/70   Pulse Readings from Last 3 Encounters:  08/01/18 80  05/01/18 78  04/06/18 82    Physical Exam  Constitutional: She is oriented to person, place, and time. She appears well-developed and well-nourished. No distress.  Eyes: Pupils are equal, round, and reactive to light. EOM are normal.  Cardiovascular: Normal rate.  Pulmonary/Chest: Effort normal.  Abdominal: She exhibits no distension.  Musculoskeletal: Normal range of motion.  Neurological: She is alert and oriented to person, place, and time. No cranial nerve deficit. Gait normal.  Skin: Skin is warm and dry. Rash (Mildly tender rash about the left posterior hand with erythema and surrounding erythema.  There is no fluctuance or focal abscess. ) noted. She is not diaphoretic.  Psychiatric: She has a normal mood and affect.  Vitals reviewed.        No results found for: HGBA1C  Lab Results  Component Value Date   WBC 4.3 04/06/2018   HGB 12.0 04/06/2018   HCT 35.3 04/06/2018   MCV 100.9 04/06/2018   PLT 272 04/06/2018    Lab Results  Component Value Date   CREATININE 0.71 04/06/2018   BUN 18 04/06/2018   NA 135 (L) 04/06/2018   K 4.2 04/06/2018   CL 100 04/06/2018   CO2 28 04/06/2018    Lab Results  Component Value Date   ALT  23 04/06/2018   AST 25 04/06/2018   ALKPHOS 57 04/06/2018   BILITOT 0.6 04/06/2018    Lab Results  Component Value Date   TSH 1.371 04/16/2017    Lab Results  Component Value Date   CHOL 158 10/08/2015   HDL 47 10/08/2015   LDLCALC 86 10/08/2015   TRIG 123 10/08/2015   CHOLHDL 3.4 10/08/2015     ASSESSMENT AND PLAN:  Bradi was seen today for rash.  Diagnoses and all orders for this visit:  Cellulitis of hand, left: Symptoms started after skin puncture with a [place] tree.  I can find no clinical information regarding this.  I will start her on keflex.  She will come back tomorrow for a recheck  and if worsening I think a call to hand would be a reasonable next step.  I helped her make an appointment with PA McVey for mid afternoon.  -     Td vaccine greater than or equal to 7yo preservative free IM -     cephALEXin (KEFLEX) 500 MG capsule; Take 1 capsule (500 mg total) by mouth 3 (three) times daily.    The patient is advised to call or return to clinic if she does not see an improvement in symptoms, or to seek the care of the closest emergency department if she worsens with the above plan.   Philis Fendt, MHS, PA-C [facility] 08/01/2018 3:31 PM

## 2018-08-02 ENCOUNTER — Other Ambulatory Visit: Payer: Self-pay

## 2018-08-02 ENCOUNTER — Ambulatory Visit (INDEPENDENT_AMBULATORY_CARE_PROVIDER_SITE_OTHER): Payer: Medicare HMO | Admitting: Physician Assistant

## 2018-08-02 ENCOUNTER — Encounter: Payer: Self-pay | Admitting: Physician Assistant

## 2018-08-02 VITALS — BP 114/60 | HR 81 | Temp 98.1°F | Resp 16 | Ht <= 58 in | Wt 119.0 lb

## 2018-08-02 DIAGNOSIS — L03114 Cellulitis of left upper limb: Secondary | ICD-10-CM | POA: Diagnosis not present

## 2018-08-02 NOTE — Progress Notes (Signed)
Jessica Zavala  MRN: [id number] DOB: 09-16-1946  PCP: [name], MD  Subjective:  Pt is a pleasant 72 year old female who presents to clinic for f/u hand infection. She is here today with her husband who is helping interpret.  She was here 8/28 and seen by Philis Fendt, started Keflex 500mg  tid. She is taking medication, however sleeps > 8 hours/night and is a few hours late on taking her morning dose. She is unsure if her condition is any better. Pain is not radiating.  Denies involvement of her wrist.   Review of Systems  Constitutional: Negative for chills and fever.  Musculoskeletal: Positive for arthralgias (L hand). Negative for joint swelling.  Skin: Positive for wound.    Patient Active Problem List   Diagnosis Date Noted  . Metastatic breast cancer ([place])   . Bone metastases ([place]) 02/02/2016  . Breast cancer of lower-outer quadrant of right female breast ([place]) 01/05/2016  . Hypercholesterolemia 06/14/2012  . Hypertension 01/06/2012  . Eczema 01/06/2012    Current Outpatient Medications on File Prior to Visit  Medication Sig Dispense Refill  . cephALEXin (KEFLEX) 500 MG capsule Take 1 capsule (500 mg total) by mouth 3 (three) times daily. 30 capsule 0  . clobetasol cream (TEMOVATE) 2.13 % Apply 1 application topically 2 (two) times daily as needed. 30 g 0  . clotrimazole-betamethasone (LOTRISONE) cream Apply 1 application topically 2 (two) times daily as needed (skin irritation). 45 g 0  . furosemide (LASIX) 20 MG tablet Take 1 tablet (20 mg total) by mouth daily. 30 tablet 0  . IBRANCE 75 MG capsule TAKE 1 CAPSULE BY MOUTH DAILY WITH FOOD. TAKE FOR 2 WEEKS ON 2 WEEKS OFF 14 capsule 6  . labetalol (NORMODYNE) 200 MG tablet Take 1 tablet (200 mg total) by mouth 2 (two) times daily. 30 tablet 1  . letrozole (FEMARA) 2.5 MG tablet TAKE 1 TABLET BY MOUTH ONCE DAILY 90 tablet 3  . atorvastatin (LIPITOR) 20 MG tablet Take 1 tablet (20 mg total) by mouth daily. 30 tablet 11  .  Flurandrenolide 4 MCG/SQCM TAPE Apply 1 each topically at bedtime as needed and may repeat dose one time if needed. (Patient not taking: Reported on 08/01/2018) 1 each 1   No current facility-administered medications on file prior to visit.     Allergies  Allergen Reactions  . Aspirin Swelling    Mouth and facial swelling     Objective:  BP 114/60 (BP Location: Left Arm, Patient Position: Sitting, Cuff Size: Normal)   Pulse 81   Temp 98.1 F (36.7 C) (Oral)   Resp 16   Ht 4\' 9"  (0.865 m)   Wt 119 lb (54 kg)   SpO2 99%   BMI 25.75 kg/m   Physical Exam  Constitutional: She is oriented to person, place, and time. No distress.  Cardiovascular: Intact distal pulses and normal pulses.  Neurological: She is alert and oriented to person, place, and time.  Skin: Skin is warm and dry.  Swelling, mild warmth and redness of posterior left hand. Appears to be improved compared to the image from her last OV. No involvement of her joints. No drainage.   Psychiatric: Judgment normal.  Vitals reviewed.     Assessment and Plan :  1. Cellulitis of left upper extremity - pt here for f/u cellulitis of L hand. Hand is improving compared to image from her last OV. There was some misunderstanding as to whether she is to take 3  pills/day. Discussed Keflex 500mg  tid instructions and wrote out timing of future doses for pt. The couple verbalizes understanding. RTC in 5 days if no improvement.    Mercer Pod, PA-C  [facility] 08/02/2018 3:27 PM  Please note: Portions of this report may have been transcribed using dragon voice recognition software. Every effort was made to ensure accuracy; however, inadvertent computerized transcription errors may be present.

## 2018-08-02 NOTE — Patient Instructions (Addendum)
  Keflex: take this every 8 hours.  Take without regard to food. If it causes upset stomach, take with food.  Tylenol for pain  Come back if you are not improving by Monday  Thank you for coming in today. I hope you feel we met your needs.  Feel free to call PCP if you have any questions or further requests.  Please consider signing up for MyChart if you do not already have it, as this is a great way to communicate with me.  Best,  Whitney McVey, PA-C   IF you received an x-ray today, you will receive an invoice from [facility] Rad[facility]. Please contact [facility] Radiology at [phone] with questions or concerns regarding your invoice.   IF you received labwork today, you will receive an invoice from [facility]. Please contact [facility] at [phone] with questions or concerns regarding your invoice.   Our billing staff will not be able to assist you with questions regarding bills from these companies.  You will be contacted with the lab results as soon as they are available. The fastest way to get your results is to activate your My Chart account. Instructions are located on the last page of this paperwork. If you have not heard from [place] regarding the results in 2 weeks, please contact this office.

## 2018-08-03 ENCOUNTER — Inpatient Hospital Stay: Payer: Medicare HMO | Attending: Hematology and Oncology

## 2018-08-03 ENCOUNTER — Ambulatory Visit (HOSPITAL_COMMUNITY)
Admission: RE | Admit: 2018-08-03 | Discharge: 2018-08-03 | Disposition: A | Discharge status: Home / Self Care | Payer: Medicare HMO | Source: Ambulatory Visit | Attending: Hematology and Oncology | Admitting: Hematology and Oncology

## 2018-08-03 ENCOUNTER — Encounter (HOSPITAL_COMMUNITY): Payer: Self-pay

## 2018-08-03 DIAGNOSIS — Z170 Estrogen receptor positive status [ER+]: Secondary | ICD-10-CM

## 2018-08-03 DIAGNOSIS — Z9011 Acquired absence of right breast and nipple: Secondary | ICD-10-CM | POA: Diagnosis not present

## 2018-08-03 DIAGNOSIS — C7951 Secondary malignant neoplasm of bone: Secondary | ICD-10-CM | POA: Insufficient documentation

## 2018-08-03 DIAGNOSIS — C50511 Malignant neoplasm of lower-outer quadrant of right female breast: Secondary | ICD-10-CM

## 2018-08-03 DIAGNOSIS — C50919 Malignant neoplasm of unspecified site of unspecified female breast: Secondary | ICD-10-CM | POA: Diagnosis not present

## 2018-08-03 HISTORY — DX: Malignant neoplasm of unspecified site of unspecified female breast: C50.919

## 2018-08-03 LAB — CMP (CANCER CENTER ONLY)
ALBUMIN: 3.7 g/dL (ref 3.5–5.0)
ALT: 11 U/L (ref 0–44)
ANION GAP: 8 (ref 5–15)
AST: 21 U/L (ref 15–41)
Alkaline Phosphatase: 59 U/L (ref 38–126)
BUN: 17 mg/dL (ref 8–23)
CHLORIDE: 102 mmol/L (ref 98–111)
CO2: 28 mmol/L (ref 22–32)
Calcium: 9.4 mg/dL (ref 8.9–10.3)
Creatinine: 0.72 mg/dL (ref 0.44–1.00)
GFR, Estimated: >60 mL/min (ref >60)
GLUCOSE: 95 mg/dL (ref 70–99)
Potassium: 4.5 mmol/L (ref 3.5–5.1)
SODIUM: 138 mmol/L (ref 135–145)
Total Bilirubin: 0.4 mg/dL (ref 0.3–1.2)
Total Protein: 8 g/dL (ref 6.5–8.1)

## 2018-08-03 LAB — CBC WITH DIFFERENTIAL (CANCER CENTER ONLY)
BASOS PCT: 1 %
Basophils Absolute: 0 10*3/uL (ref 0.0–0.1)
EOS ABS: 0.2 10*3/uL (ref 0.0–0.5)
EOS PCT: 8 %
HCT: 34.5 % — ABNORMAL LOW (ref 34.8–46.6)
Hemoglobin: 11.6 g/dL (ref 11.6–15.9)
LYMPHS ABS: 1 10*3/uL (ref 0.9–3.3)
Lymphocytes Relative: 36 %
MCH: 34.8 pg — AB (ref 25.1–34.0)
MCHC: 33.7 g/dL (ref 31.5–36.0)
MCV: 103.2 fL — ABNORMAL HIGH (ref 79.5–101.0)
MONOS PCT: 15 %
Monocytes Absolute: 0.4 10*3/uL (ref 0.1–0.9)
Neutro Abs: 1.2 10*3/uL — ABNORMAL LOW (ref 1.5–6.5)
Neutrophils Relative %: 40 %
PLATELETS: 262 10*3/uL (ref 145–400)
RBC: 3.34 MIL/uL — AB (ref 3.70–5.45)
RDW: 15.8 % — ABNORMAL HIGH (ref 11.2–14.5)
WBC Count: 2.8 10*3/uL — ABNORMAL LOW (ref 3.9–10.3)

## 2018-08-03 MED ORDER — IOHEXOL 300 MG/ML  SOLN
100.0000 mL | Freq: Once | INTRAMUSCULAR | Status: AC | PRN
  Administered 2018-08-03: 75 mL via INTRAVENOUS

## 2018-08-07 ENCOUNTER — Telehealth: Payer: Self-pay

## 2018-08-07 NOTE — Telephone Encounter (Addendum)
Oral Oncology Patient Advocate Encounter  Jeff asked me to check on [place] since refill was coming up soon. The balance is $0 so I was able to secure her a grant through [employer] (TAF). This will make her copay $0. The information is as follows and has been shared with [place].  Approved 08/07/2018-12/04/18  BIN: [id number] PCN: [id number] Group: [id number] ID: [id number]   I called the patients husband, no answer and no voicemail. I called the son [name]) and was able to leave a Voicemail with the above information.  [place] Patient [place] Phone [phone] Fax [phone]

## 2018-08-09 ENCOUNTER — Other Ambulatory Visit: Payer: Self-pay

## 2018-08-09 DIAGNOSIS — C50511 Malignant neoplasm of lower-outer quadrant of right female breast: Secondary | ICD-10-CM

## 2018-08-09 DIAGNOSIS — Z170 Estrogen receptor positive status [ER+]: Principal | ICD-10-CM

## 2018-08-10 ENCOUNTER — Telehealth: Payer: Self-pay | Admitting: Hematology and Oncology

## 2018-08-10 ENCOUNTER — Telehealth: Payer: Self-pay | Admitting: Pharmacist

## 2018-08-10 ENCOUNTER — Inpatient Hospital Stay: Payer: Medicare HMO | Attending: Hematology and Oncology

## 2018-08-10 ENCOUNTER — Inpatient Hospital Stay (HOSPITAL_BASED_OUTPATIENT_CLINIC_OR_DEPARTMENT_OTHER): Payer: Medicare HMO | Admitting: Hematology and Oncology

## 2018-08-10 VITALS — BP 135/77 | HR 67 | Temp 97.9°F | Resp 16 | Ht <= 58 in | Wt 117.8 lb

## 2018-08-10 DIAGNOSIS — C7951 Secondary malignant neoplasm of bone: Secondary | ICD-10-CM | POA: Diagnosis not present

## 2018-08-10 DIAGNOSIS — Z170 Estrogen receptor positive status [ER+]: Secondary | ICD-10-CM | POA: Diagnosis not present

## 2018-08-10 DIAGNOSIS — C50511 Malignant neoplasm of lower-outer quadrant of right female breast: Secondary | ICD-10-CM

## 2018-08-10 LAB — CBC WITH DIFFERENTIAL (CANCER CENTER ONLY)
BASOS ABS: 0.1 10*3/uL (ref 0.0–0.1)
BASOS PCT: 2 %
EOS PCT: 9 %
Eosinophils Absolute: 0.3 10*3/uL (ref 0.0–0.5)
HEMATOCRIT: 35.1 % (ref 34.8–46.6)
Hemoglobin: 11.9 g/dL (ref 11.6–15.9)
Lymphocytes Relative: 33 %
Lymphs Abs: 1.1 10*3/uL (ref 0.9–3.3)
MCH: 34.5 pg — ABNORMAL HIGH (ref 25.1–34.0)
MCHC: 33.9 g/dL (ref 31.5–36.0)
MCV: 101.8 fL — ABNORMAL HIGH (ref 79.5–101.0)
MONOS PCT: 14 %
Monocytes Absolute: 0.5 10*3/uL (ref 0.1–0.9)
NEUTROS PCT: 42 %
Neutro Abs: 1.4 10*3/uL — ABNORMAL LOW (ref 1.5–6.5)
PLATELETS: 339 10*3/uL (ref 145–400)
RBC: 3.45 MIL/uL — AB (ref 3.70–5.45)
RDW: 15.6 % — AB (ref 11.2–14.5)
WBC Count: 3.3 10*3/uL — ABNORMAL LOW (ref 3.9–10.3)

## 2018-08-10 LAB — CMP (CANCER CENTER ONLY)
ALK PHOS: 57 U/L (ref 38–126)
ALT: 14 U/L (ref 0–44)
AST: 24 U/L (ref 15–41)
Albumin: 3.7 g/dL (ref 3.5–5.0)
Anion gap: 7 (ref 5–15)
BUN: 12 mg/dL (ref 8–23)
CALCIUM: 9.4 mg/dL (ref 8.9–10.3)
CO2: 31 mmol/L (ref 22–32)
CREATININE: 0.77 mg/dL (ref 0.44–1.00)
Chloride: 101 mmol/L (ref 98–111)
GFR, Estimated: >60 mL/min (ref >60)
Glucose, Bld: 92 mg/dL (ref 70–99)
Potassium: 4.6 mmol/L (ref 3.5–5.1)
SODIUM: 139 mmol/L (ref 135–145)
Total Bilirubin: 0.5 mg/dL (ref 0.3–1.2)
Total Protein: 7.8 g/dL (ref 6.5–8.1)

## 2018-08-10 MED ORDER — DENOSUMAB 120 MG/1.7ML ~~LOC~~ SOLN
SUBCUTANEOUS | Status: AC
  Filled 2018-08-10: qty 1.7

## 2018-08-10 MED ORDER — DENOSUMAB 120 MG/1.7ML ~~LOC~~ SOLN
120.0000 mg | Freq: Once | SUBCUTANEOUS | Status: AC
  Administered 2018-08-10: 120 mg via SUBCUTANEOUS

## 2018-08-10 MED FILL — IBRANCE 75 MG CAPSULE: 75 | 28 days supply | Qty: 14 | Fill #5

## 2018-08-10 NOTE — Assessment & Plan Note (Signed)
Metastatic breast cancer PET-CT 01/14/16: Widespread metastatic disease right breast, right chest wall several nodules largest 3.7 cm, mediastinum, right internal mammary lymph node 2.2 cm, right pleural-based nodules 2.1cm and 2.7 cm, malignant pleural effusion, right common iliac lymph node, bone metastases in the thorax (sternum, manubrium, multiple thoracic vertebral bodies, multiple right-sided ribs)  Right chest wall mass biopsy 2:30 position 01/13/2016: Invasive ductal carcinoma focally involves skeletal muscle, grade 2, HER-2 negative ratio 1.08, ER/PR pending (Right breast cancer 1996 status post lumpectomy followed by chemotherapy followed by radiation and tamoxifen 5 years (details are not available)) ---------------------------------------------------------------------------------------------------------------------------------------------------------- Treatment plan: Ibrance with letrozole started 01/20/2016 , cycle 3 Ibrance 100 mg, Cycle 4 Ibrance 75 mg dose; switch to 75 mg 3 weeks on 2 weeks offfrom 07/22/2016, changed to 2 weeks on 2 weeks off12/12/2015.  CT chest abdomen pelvis 08/03/2018: Stable matted soft tissue density in the right internal mammary/anterior mediastinal region which may be treated tumor related to sternal disease.  Stable bone metastases no new lesions.  Radiology review: I discussed the radiology findings which showed remarkable stable findings. Continue with the current treatment plan Labs and follow-up in 3 months. Scans every 6 months.

## 2018-08-10 NOTE — Progress Notes (Signed)
Patient Care Team: [name], MD as PCP - General (Internal Medicine) [name], MD as Consulting Physician (Hematology and Oncology)  DIAGNOSIS:  Encounter Diagnoses  Name Primary?  . Bone metastases ([place]) Yes  . Malignant neoplasm of lower-outer quadrant of right breast of female, estrogen receptor positive ([place])     SUMMARY OF ONCOLOGIC HISTORY:   Breast cancer of lower-outer quadrant of right female breast ([place])   01/04/1995 Surgery    Right breast cancer status post lumpectomy followed by chemotherapy followed by radiation and tamoxifen 5 years (details are not available)    12/30/2015 Relapse/Recurrence    Chest wall/ soft tissue masses near the right side of the sternum, superior nodule measuring 14.5 mm, inferior nodule measuring 10.5 mm, 26 mm anterior mediastinal soft tissue mass, T9 bone metastases    12/30/2015 Procedure    Thoracentesis: 400 mL removed; showed reactive mesothelial cells, no cancer cells    12/30/2015 Imaging    CT chest: 26 x 17 mm anterior mediastinal soft tissue mass, mixed lytic and sclerotic bone mets mainly involving sternum and T9 vertebral body, scattered vertebral body involvement and rib involvement; chest wall soft tissue masses 14.5 mm, 10.5 mm    01/13/2016 Initial Biopsy    Right chest wall mass biopsy 2:30 position: Invasive ductal carcinoma focally involves skeletal muscle, grade 2, HER-2 negative ratio 1.08, ER/PR 95% positive    01/14/2016 PET scan    Widespread metastatic disease right breast, right chest wall, mediastinum, right internal mammary lymph nodes, right pleural space, malignant pleural effusion, right common iliac lymph node, bone metastases in the thorax    01/21/2016 -  Anti-estrogen oral therapy    Ibrance with letrozole    04/15/2017 - 04/23/2017 Hospital Admission    Pneumonia and confusion     CHIEF COMPLIANT: Follow-up of metastatic breast cancer on Ibrance with letrozole  INTERVAL HISTORY: Jessica Zavala is a  72 year old with above-mentioned some metastatic breast cancer currently on Ibrance with letrozole.  She underwent a recent scans and is here today to discuss results.  Shingles rash appears to have resolved.  She denies any pain or discomfort.  Denies any fevers or chills.  REVIEW OF SYSTEMS:   Constitutional: Denies fevers, chills or abnormal weight loss Eyes: Denies blurriness of vision Ears, nose, mouth, throat, and face: Denies mucositis or sore throat Respiratory: Denies cough, dyspnea or wheezes Cardiovascular: Denies palpitation, chest discomfort Gastrointestinal:  Denies nausea, heartburn or change in bowel habits Skin: Denies abnormal skin rashes Lymphatics: Denies new lymphadenopathy or easy bruising Neurological:Denies numbness, tingling or new weaknesses Behavioral/Psych: Mood is stable, no new changes  Extremities: No lower extremity edema Breast:  denies any pain or lumps or nodules in either breasts All other systems were reviewed with the patient and are negative.  I have reviewed the past medical history, past surgical history, social history and family history with the patient and they are unchanged from previous note.  ALLERGIES:  is allergic to aspirin.  MEDICATIONS:  Current Outpatient Medications  Medication Sig Dispense Refill  . atorvastatin (LIPITOR) 20 MG tablet Take 1 tablet (20 mg total) by mouth daily. 30 tablet 11  . cephALEXin (KEFLEX) 500 MG capsule Take 1 capsule (500 mg total) by mouth 3 (three) times daily. 30 capsule 0  . clobetasol cream (TEMOVATE) 7.78 % Apply 1 application topically 2 (two) times daily as needed. 30 g 0  . clotrimazole-betamethasone (LOTRISONE) cream Apply 1 application topically 2 (two) times daily as needed (  skin irritation). 45 g 0  . Flurandrenolide 4 MCG/SQCM TAPE Apply 1 each topically at bedtime as needed and may repeat dose one time if needed. (Patient not taking: Reported on 08/01/2018) 1 each 1  . furosemide (LASIX) 20 MG  tablet Take 1 tablet (20 mg total) by mouth daily. 30 tablet 0  . IBRANCE 75 MG capsule TAKE 1 CAPSULE BY MOUTH DAILY WITH FOOD. TAKE FOR 2 WEEKS ON 2 WEEKS OFF 14 capsule 6  . labetalol (NORMODYNE) 200 MG tablet Take 1 tablet (200 mg total) by mouth 2 (two) times daily. 30 tablet 1  . letrozole (FEMARA) 2.5 MG tablet TAKE 1 TABLET BY MOUTH ONCE DAILY 90 tablet 3   Current Facility-Administered Medications  Medication Dose Route Frequency Provider Last Rate Last Dose  . denosumab (XGEVA) injection 120 mg  120 mg Subcutaneous Once [name], MD        PHYSICAL EXAMINATION: ECOG PERFORMANCE STATUS: 1 - Symptomatic but completely ambulatory  Vitals:   08/10/18 1056  BP: 135/77  Pulse: 67  Resp: 16  Temp: 97.9 F (36.6 C)  SpO2: 96%   Filed Weights   08/10/18 1056  Weight: 117 lb 12.8 oz (53.4 kg)    GENERAL:alert, no distress and comfortable SKIN: skin color, texture, turgor are normal, no rashes or significant lesions EYES: normal, Conjunctiva are pink and non-injected, sclera clear OROPHARYNX:no exudate, no erythema and lips, buccal mucosa, and tongue normal  NECK: supple, thyroid normal size, non-tender, without nodularity LYMPH:  no palpable lymphadenopathy in the cervical, axillary or inguinal LUNGS: clear to auscultation and percussion with normal breathing effort HEART: regular rate & rhythm and no murmurs and no lower extremity edema ABDOMEN:abdomen soft, non-tender and normal bowel sounds MUSCULOSKELETAL:no cyanosis of digits and no clubbing  NEURO: alert & oriented x 3 with fluent speech, no focal motor/sensory deficits EXTREMITIES: No lower extremity edema   LABORATORY DATA:  I have reviewed the data as listed CMP Latest Ref Rng & Units 08/10/2018 08/03/2018 04/06/2018  Glucose 70 - 99 mg/dL 92 95 91  BUN 8 - 23 mg/dL _0 Creatinine 0.44 - 1.00 mg/dL 0.77 0.72 0.71  Sodium 135 - 145 mmol/L 139 138 135(L)  Potassium 3.5 - 5.1 mmol/L 4.6 4.5 4.2  Chloride 98  - 111 mmol/L 101 102 100  CO2 22 - 32 mmol/L _1 Calcium 8.9 - 10.3 mg/dL 9.4 9.4 9.2  Total Protein 6.5 - 8.1 g/dL 7.8 8.0 7.6  Total Bilirubin 0.3 - 1.2 mg/dL 0.5 0.4 0.6  Alkaline Phos 38 - 126 U/L 57 59 57  AST 15 - 41 U/L _2 ALT 0 - 44 U/L _3 Lab Results  Component Value Date   WBC 3.3 (L) 08/10/2018   HGB 11.9 08/10/2018   HCT 35.1 08/10/2018   MCV 101.8 (H) 08/10/2018   PLT 339 08/10/2018   NEUTROABS 1.4 (L) 08/10/2018    ASSESSMENT & PLAN:  Breast cancer of lower-outer quadrant of right female breast ([place]) Metastatic breast cancer PET-CT 01/14/16: Widespread metastatic disease right breast, right chest wall several nodules largest 3.7 cm, mediastinum, right internal mammary lymph node 2.2 cm, right pleural-based nodules 2.1cm and 2.7 cm, malignant pleural effusion, right common iliac lymph node, bone metastases in the thorax (sternum, manubrium, multiple thoracic vertebral bodies, multiple right-sided ribs)  Right chest wall mass biopsy 2:30 position 01/13/2016: Invasive ductal carcinoma focally involves skeletal muscle, grade 2, HER-2 negative  ratio 1.08, ER/PR pending (Right breast cancer 1996 status post lumpectomy followed by chemotherapy followed by radiation and tamoxifen 5 years (details are not available)) ---------------------------------------------------------------------------------------------------------------------------------------------------------- Treatment plan: Ibrance with letrozole started 01/20/2016 , cycle 3 Ibrance 100 mg, Cycle 4 Ibrance 75 mg dose; switch to 75 mg 3 weeks on 2 weeks offfrom 07/22/2016, changed to 2 weeks on 2 weeks off12/12/2015.  CT chest abdomen pelvis 08/03/2018: Stable matted soft tissue density in the right internal mammary/anterior mediastinal region which may be treated tumor related to sternal disease.  Stable bone metastases no new lesions.  Radiology review: I discussed the radiology findings which  showed remarkable stable findings. Continue with the current treatment plan Labs, Xgeva and MD visits and follow-up in 3 months. Scans every 6 months.     Orders Placed This Encounter  Procedures  . CBC with Differential (Cancer Center Only)    Standing Status:   Future    Standing Expiration Date:   08/11/2019  . CMP ([place] only)    Standing Status:   Future    Standing Expiration Date:   08/11/2019   The patient has a good understanding of the overall plan. she agrees with it. she will call with any problems that may develop before the next visit here.   Harriette Ohara, MD 08/10/18

## 2018-08-10 NOTE — Telephone Encounter (Signed)
Gave pt avs and calendar  °

## 2018-08-10 NOTE — Telephone Encounter (Signed)
Oral Chemotherapy Pharmacist Encounter   Spoke with patient and husband, with the aid of an interpreter, in exam room today to follow up regarding patient's oral chemotherapy medication: Leslee Home  Original Start date of oral chemotherapy: 01/20/16  Pt is doing well today  Pt reports 0 tablets/doses of Ibrance 75mg  capsules, 1 capsule taken by mouth once daily with food for 2 weeks on, 2 weeks off, repeat every 4 weeks, missed in the last month.   Pt reports the following side effects: none to report, patient tolerating very well, mild neutropenia is acceptable at current dosing schedule  Pertinent labs reviewed: OK for continued treatment.  Other Issues: CT scans performed 08/03/18 show stable disease I reported to patient and husband that oral oncology patient advocate was successful in obtaining another copayment grant from [employer].  I will contact the [facility] and have them prepare next fill to be ready for pick-up when the leave the office today.  Patient knows to call the office with questions or concerns. Oral Oncology Clinic will continue to follow.  Johny Drilling, PharmD, BCPS, BCOP  08/10/2018 11:12 AM Oral Oncology Clinic [phone]

## 2018-08-16 NOTE — Telephone Encounter (Signed)
Oral Oncology Patient Advocate Encounter  Patients husband brought in the conditional approval letter filled out by his son and signed by his wife Srija Southard. He was unsure of what to do with it so I told him I would fax it to [employer] so that the grant would be fully approved.  He expressed great appreciation.  [place] Patient [place] Phone [phone] Fax [phone]

## 2018-09-05 MED FILL — IBRANCE 75 MG CAPSULE: 75 | 28 days supply | Qty: 14 | Fill #6

## 2018-10-01 ENCOUNTER — Encounter: Payer: Self-pay | Admitting: Family Medicine

## 2018-10-01 ENCOUNTER — Ambulatory Visit (INDEPENDENT_AMBULATORY_CARE_PROVIDER_SITE_OTHER): Payer: Medicare HMO | Admitting: Family Medicine

## 2018-10-01 ENCOUNTER — Other Ambulatory Visit: Payer: Self-pay

## 2018-10-01 VITALS — BP 142/80 | HR 75 | Temp 98.0°F | Resp 12 | Ht <= 58 in | Wt 119.6 lb

## 2018-10-01 DIAGNOSIS — R21 Rash and other nonspecific skin eruption: Secondary | ICD-10-CM | POA: Diagnosis not present

## 2018-10-01 DIAGNOSIS — L03113 Cellulitis of right upper limb: Secondary | ICD-10-CM | POA: Diagnosis not present

## 2018-10-01 MED ORDER — CEPHALEXIN 500 MG PO CAPS: 500.0000 mg | ORAL_CAPSULE | Freq: Two times a day (BID) | ORAL | 0 refills | Status: DC

## 2018-10-01 MED ORDER — BETAMETHASONE VALERATE 0.1 % EX OINT: 1.0000 "application " | TOPICAL_OINTMENT | Freq: Two times a day (BID) | CUTANEOUS | 0 refills | Status: DC

## 2018-10-01 NOTE — Patient Instructions (Addendum)
Restart steroid cream twice per day, Eucerin 3-4 times per day.  Start antibiotic for possible cellulitis. Clean area with mild soap and water. We will recheck in next few days for infection, but also need to follow up with dermatologist. I will refer you again.      If you have lab work done today you will be contacted with your lab results within the next 2 weeks.  If you have not heard from [place] then please contact us. The fastest way to get your results is to register for My Chart.   IF you received an x-ray today, you will receive an invoice from [facility] Radiology. Please contact [facility] Radiology at [phone] with questions or concerns regarding your invoice.   IF you received labwork today, you will receive an invoice from [place]. Please contact [facility] at [phone] with questions or concerns regarding your invoice.   Our billing staff will not be able to assist you with questions regarding bills from these companies.  You will be contacted with the lab results as soon as they are available. The fastest way to get your results is to activate your My Chart account. Instructions are located on the last page of this paperwork. If you have not heard from [place] regarding the results in 2 weeks, please contact this office.

## 2018-10-01 NOTE — Progress Notes (Signed)
Subjective:  By signing my name below, I, [name], attest that this documentation has been prepared under the direction and in the presence of Wendie Agreste, MD Electronically Signed: Ladene Artist, ED Scribe 10/01/2018 at 4:51 PM.   Patient ID: Jessica Zavala, female    DOB: 1946/05/05, 72 y.o.   MRN: [id number]  Chief Complaint  Patient presents with  . Hand Pain    pain and itching both hand for a while seen McVey for this issue before   HPI Jessica Zavala is a 72 y.o. female who presents to Primary Care at [facility] complaining of pain and itching in hands. Seen in May for similar issues by [facility], PA-C. Symptoms present x sev yrs. Treating with clotrimazole and betamethasone. Reportedly seen dermatology more than 5 yrs prior. She was on Ibrance with letrozole for metastatic breast CA. She had received an infusion of xgeva on 5/3. Had some hyperpigmentation with excoriated patches seen on photo from May with edema of R hand at visit. Treated with Lotrisone, depo medrol 80 and referred to dermatology. Seen for hand pain again in Aug by Philis Fendt, PA-C with similar appearing lesions but noted after skin puncture from jau jau tree. Started on keflex, tetanus was updated, seen in f/u the following day. Still some erythema and warmth of the dorsal hand but improving on keflex.  [employer] Interpreter ID # [id number]; lost connection. ID # [id number]. Pt speaks [place]. Pt presents today with severe bilat hand itching, pain, swelling to the R hand and redness, similar to the symptoms she was experiencing in May. She noticed swelling to the R hand 2-3 days ago, itching, pain and redness ~2 wks ago. Pt reports that symptoms temporarily improve with cream but then return. Denies fever. She is no longer getting injections for breast CA, she is only taking pills now. Pt states that she still has not heard from dermatology, however, husband states she saw Dr. Allyson Sabal ~10 yrs ago for the same, prior to  chemo.  Patient Active Problem List   Diagnosis Date Noted  . Metastatic breast cancer ([place])   . Bone metastases ([place]) 02/02/2016  . Breast cancer of lower-outer quadrant of right female breast ([place]) 01/05/2016  . Hypercholesterolemia 06/14/2012  . Hypertension 01/06/2012  . Eczema 01/06/2012   Past Medical History:  Diagnosis Date  . Allergy   . Eczema of hand 11/15/2013  . HCAP (healthcare-associated pneumonia)   . High cholesterol   . Hyperlipidemia   . Hypertension   . Hyponatremia   . Metastatic breast cancer ([place]) dx'd 1996/12/2015   Right side, sp lumpectomy, rxt and chemo  . Pneumonia 04/15/2017   Past Surgical History:  Procedure Laterality Date  . BREAST SURGERY     Allergies  Allergen Reactions  . Aspirin Swelling    Mouth and facial swelling   Prior to Admission medications   Medication Sig Start Date End Date Taking? Authorizing Provider  atorvastatin (LIPITOR) 20 MG tablet Take 1 tablet (20 mg total) by mouth daily. 04/23/17 05/01/18  Doreatha Lew, MD  cephALEXin (KEFLEX) 500 MG capsule Take 1 capsule (500 mg total) by mouth 3 (three) times daily. 08/01/18   Tereasa Coop, PA-C  clobetasol cream (TEMOVATE) 4.23 % Apply 1 application topically 2 (two) times daily as needed. 03/03/17   McVey, Gelene Mink, PA-C  clotrimazole-betamethasone (LOTRISONE) cream Apply 1 application topically 2 (two) times daily as needed (skin irritation). 05/01/18   McVey, Gelene Mink, PA-C  Flurandrenolide 4 MCG/SQCM TAPE Apply 1 each topically at bedtime as needed and may repeat dose one time if needed. Patient not taking: Reported on 08/01/2018 07/11/17   McVey, Gelene Mink, PA-C  furosemide (LASIX) 20 MG tablet Take 1 tablet (20 mg total) by mouth daily. 04/24/17   Doreatha Lew, MD  IBRANCE 75 MG capsule TAKE 1 CAPSULE BY MOUTH DAILY WITH FOOD. TAKE FOR 2 WEEKS ON 2 WEEKS OFF 11/09/17   [name], MD  labetalol (NORMODYNE) 200 MG tablet Take 1 tablet  (200 mg total) by mouth 2 (two) times daily. 03/27/17   McVey, Gelene Mink, PA-C  letrozole [facility]) 2.5 MG tablet TAKE 1 TABLET BY MOUTH ONCE DAILY 02/20/18   [name], MD   Social History   Socioeconomic History  . Marital status: Married    Spouse name: Not on file  . Number of children: Not on file  . Years of education: Not on file  . Highest education level: Not on file  Occupational History  . Not on file  Social Needs  . Financial resource strain: Not on file  . Food insecurity:    Worry: Not on file    Inability: Not on file  . Transportation needs:    Medical: Not on file    Non-medical: Not on file  Tobacco Use  . Smoking status: Never Smoker  . Smokeless tobacco: Never Used  Substance and Sexual Activity  . Alcohol use: No    Alcohol/week: 0.0 standard drinks  . Drug use: No  . Sexual activity: Yes    Birth control/protection: Post-menopausal  Lifestyle  . Physical activity:    Days per week: Not on file    Minutes per session: Not on file  . Stress: Not on file  Relationships  . Social connections:    Talks on phone: Not on file    Gets together: Not on file    Attends religious service: Not on file    Active member of club or organization: Not on file    Attends meetings of clubs or organizations: Not on file    Relationship status: Not on file  . Intimate partner violence:    Fear of current or ex partner: Not on file    Emotionally abused: Not on file    Physically abused: Not on file    Forced sexual activity: Not on file  Other Topics Concern  . Not on file  Social History Narrative  . Not on file   Review of Systems  Musculoskeletal: Positive for arthralgias and joint swelling.  Skin: Positive for color change and rash.      Objective:   Physical Exam  Constitutional: She is oriented to person, place, and time. She appears well-developed and well-nourished. No distress.  HENT:  Head: Normocephalic and atraumatic.  Eyes:  Conjunctivae and EOM are normal.  Neck: Neck supple. No tracheal deviation present.  Cardiovascular: Normal rate.  Pulmonary/Chest: Effort normal. No respiratory distress.  Musculoskeletal: Normal range of motion.  Neurological: She is alert and oriented to person, place, and time.  Skin: Skin is warm and dry. Rash noted.  Slight dry skin on palmar R hand but no blistering or erythema. On dorsum hands: erythema with excoriated patches. Some dry cracked skin at DIPs of both hands without discharge. Larger erythematous patch with skin thickening/indruation over dorsal R hand. Erythema does not extend beyond wrist but slight associated warmth on R hand.  Psychiatric: She has a normal mood and  affect. Her behavior is normal.  Nursing note and vitals reviewed.      Vitals:   10/01/18 1628  BP: (!) 142/80  Pulse: 75  Resp: 12  Temp: 98 F (36.7 C)  TempSrc: Oral  SpO2: 97%  Weight: 119 lb 9.6 oz (54.3 kg)  Height: 4\' 9"  (1.448 m)      Assessment & Plan:   Yasheka KELLIANNE EK is a 72 y.o. female Rash of hands - Plan: betamethasone valerate ointment (VALISONE) 0.1 %, Ambulatory referral to Dermatology, CANCELED: Ambulatory referral to Dermatology  Cellulitis of right hand - Plan: cephALEXin (KEFLEX) 500 MG capsule  -Recurrent hand rash, may be component of atopic dermatitis with excoriation and dry skin.  Less likely medication related as limited to hands only.  -Referred back to dermatology  -Restart topical steroid twice daily.  -Hydrating lotion such as Eucerin multiple times per day.  -Possible secondary cellulitis, start Keflex with recheck in the next few days.  RTC precautions if worse sooner  Meds ordered this encounter  Medications  . betamethasone valerate ointment (VALISONE) 0.1 %    Sig: Apply 1 application topically 2 (two) times daily.    Dispense:  30 g    Refill:  0  . cephALEXin (KEFLEX) 500 MG capsule    Sig: Take 1 capsule (500 mg total) by mouth 2 (two) times  daily.    Dispense:  14 capsule    Refill:  0   Patient Instructions   Restart steroid cream twice per day, Eucerin 3-4 times per day.  Start antibiotic for possible cellulitis. Clean area with mild soap and water. We will recheck in next few days for infection, but also need to follow up with dermatologist. I will refer you again.      If you have lab work done today you will be contacted with your lab results within the next 2 weeks.  If you have not heard from [place] then please contact us. The fastest way to get your results is to register for My Chart.   IF you received an x-ray today, you will receive an invoice from [facility]. Please contact [facility] at [phone] with questions or concerns regarding your invoice.   IF you received labwork today, you will receive an invoice from [place]. Please contact [facility] at [phone] with questions or concerns regarding your invoice.   Our billing staff will not be able to assist you with questions regarding bills from these companies.  You will be contacted with the lab results as soon as they are available. The fastest way to get your results is to activate your My Chart account. Instructions are located on the last page of this paperwork. If you have not heard from [place] regarding the results in 2 weeks, please contact this office.           I personally performed the services described in this documentation, which was scribed in my presence. The recorded information has been reviewed and considered for accuracy and completeness, addended by me as needed, and agree with information above.  Signed,   Merri Ray, MD Primary Care at [place].  10/03/18 3:59 PM

## 2018-10-03 ENCOUNTER — Encounter: Payer: Self-pay | Admitting: Family Medicine

## 2018-10-04 ENCOUNTER — Ambulatory Visit: Payer: Medicare HMO | Admitting: Physician Assistant

## 2018-10-06 ENCOUNTER — Encounter: Payer: Self-pay | Admitting: Family Medicine

## 2018-10-06 ENCOUNTER — Ambulatory Visit (INDEPENDENT_AMBULATORY_CARE_PROVIDER_SITE_OTHER): Payer: Medicare HMO | Admitting: Family Medicine

## 2018-10-06 VITALS — BP 135/78 | HR 67 | Temp 97.8°F | Resp 18 | Ht <= 58 in | Wt 118.2 lb

## 2018-10-06 DIAGNOSIS — R21 Rash and other nonspecific skin eruption: Secondary | ICD-10-CM

## 2018-10-06 DIAGNOSIS — L309 Dermatitis, unspecified: Secondary | ICD-10-CM

## 2018-10-06 NOTE — Patient Instructions (Addendum)
  Dr. Allyson Sabal Dermatology - [phone]     If you have lab work done today you will be contacted with your lab results within the next 2 weeks.  If you have not heard from [place] then please contact us. The fastest way to get your results is to register for My Chart.   IF you received an x-ray today, you will receive an invoice from [facility]. Please contact [facility] at [phone] with questions or concerns regarding your invoice.   IF you received labwork today, you will receive an invoice from [place]. Please contact [facility] at [phone] with questions or concerns regarding your invoice.   Our billing staff will not be able to assist you with questions regarding bills from these companies.  You will be contacted with the lab results as soon as they are available. The fastest way to get your results is to activate your My Chart account. Instructions are located on the last page of this paperwork. If you have not heard from [place] regarding the results in 2 weeks, please contact this office.

## 2018-10-06 NOTE — Progress Notes (Signed)
Chief Complaint  Patient presents with  . Hand Pain    recheck her hand; states its better    HPI  Patient has a history of recurrent hand eczema She reports that the creams and antibotic helped to resolve the rash She denies any itching currently She states that she is not sure if she should see Dermatology  Past Medical History:  Diagnosis Date  . Allergy   . Eczema of hand 11/15/2013  . HCAP (healthcare-associated pneumonia)   . High cholesterol   . Hyperlipidemia   . Hypertension   . Hyponatremia   . Metastatic breast cancer ([place]) dx'd 1996/12/2015   Right side, sp lumpectomy, rxt and chemo  . Pneumonia 04/15/2017    Current Outpatient Medications  Medication Sig Dispense Refill  . betamethasone valerate ointment (VALISONE) 0.1 % Apply 1 application topically 2 (two) times daily. 30 g 0  . cephALEXin (KEFLEX) 500 MG capsule Take 1 capsule (500 mg total) by mouth 2 (two) times daily. 14 capsule 0  . clobetasol cream (TEMOVATE) 0.25 % Apply 1 application topically 2 (two) times daily as needed. 30 g 0  . clotrimazole-betamethasone (LOTRISONE) cream Apply 1 application topically 2 (two) times daily as needed (skin irritation). 45 g 0  . Flurandrenolide 4 MCG/SQCM TAPE Apply 1 each topically at bedtime as needed and may repeat dose one time if needed. 1 each 1  . furosemide (LASIX) 20 MG tablet Take 1 tablet (20 mg total) by mouth daily. 30 tablet 0  . IBRANCE 75 MG capsule TAKE 1 CAPSULE BY MOUTH DAILY WITH FOOD. TAKE FOR 2 WEEKS ON 2 WEEKS OFF 14 capsule 6  . labetalol (NORMODYNE) 200 MG tablet Take 1 tablet (200 mg total) by mouth 2 (two) times daily. 30 tablet 1  . letrozole (FEMARA) 2.5 MG tablet TAKE 1 TABLET BY MOUTH ONCE DAILY 90 tablet 3  . atorvastatin (LIPITOR) 20 MG tablet Take 1 tablet (20 mg total) by mouth daily. 30 tablet 11   No current facility-administered medications for this visit.     Allergies:  Allergies  Allergen Reactions  . Aspirin Swelling   Mouth and facial swelling    Past Surgical History:  Procedure Laterality Date  . BREAST SURGERY      Social History   Socioeconomic History  . Marital status: Married    Spouse name: Not on file  . Number of children: Not on file  . Years of education: Not on file  . Highest education level: Not on file  Occupational History  . Not on file  Social Needs  . Financial resource strain: Not on file  . Food insecurity:    Worry: Not on file    Inability: Not on file  . Transportation needs:    Medical: Not on file    Non-medical: Not on file  Tobacco Use  . Smoking status: Never Smoker  . Smokeless tobacco: Never Used  Substance and Sexual Activity  . Alcohol use: No    Alcohol/week: 0.0 standard drinks  . Drug use: No  . Sexual activity: Yes    Birth control/protection: Post-menopausal  Lifestyle  . Physical activity:    Days per week: Not on file    Minutes per session: Not on file  . Stress: Not on file  Relationships  . Social connections:    Talks on phone: Not on file    Gets together: Not on file    Attends religious service: Not on file  Active member of club or organization: Not on file    Attends meetings of clubs or organizations: Not on file    Relationship status: Not on file  Other Topics Concern  . Not on file  Social History Narrative  . Not on file    No family history on file.   ROS Review of Systems See HPI Constitution: No fevers or chills No malaise No diaphoresis Skin: see hpi Eyes: no blurry vision, no double vision GU: no dysuria or hematuria Neuro: no dizziness or headaches all others reviewed and negative   Objective: Vitals:   10/06/18 1233  BP: 135/78  Pulse: 67  Resp: 18  Temp: 97.8 F (36.6 C)  TempSrc: Oral  SpO2: 98%  Weight: 118 lb 3.2 oz (53.6 kg)  Height: 4\' 9"  (1.448 m)    Physical Exam  Constitutional: She is oriented to person, place, and time. She appears well-developed and well-nourished.  This  petite female  HENT:  Head: Normocephalic and atraumatic.  Eyes: Conjunctivae and EOM are normal.  Pulmonary/Chest: Effort normal.  Neurological: She is alert and oriented to person, place, and time.  Skin: Skin is warm. No rash noted.  Very thin skin on hands and feet with tortuous veins  Psychiatric: She has a normal mood and affect. Her behavior is normal. Judgment and thought content normal.    Assessment and Plan Jessica Zavala was seen today for hand pain.  Diagnoses and all orders for this visit:  Rash of hands  Eczema of both hands   Improved with steroid cream and antibiotic Reviewed skin care Re-emphasized follow up with Dermatology Discussed stopping steroid after 10-14 days   [place]

## 2018-10-08 ENCOUNTER — Other Ambulatory Visit: Payer: Self-pay | Admitting: Hematology and Oncology

## 2018-10-09 MED FILL — IBRANCE 75 MG CAPSULE: 75 | 28 days supply | Qty: 14 | Fill #0

## 2018-11-06 MED FILL — IBRANCE 75 MG CAPSULE: 75 | 28 days supply | Qty: 14 | Fill #1

## 2018-11-09 ENCOUNTER — Inpatient Hospital Stay: Payer: Medicare HMO | Attending: Hematology and Oncology

## 2018-11-09 ENCOUNTER — Inpatient Hospital Stay: Payer: Medicare HMO

## 2018-11-09 ENCOUNTER — Telehealth: Payer: Self-pay | Admitting: Hematology and Oncology

## 2018-11-09 ENCOUNTER — Inpatient Hospital Stay (HOSPITAL_BASED_OUTPATIENT_CLINIC_OR_DEPARTMENT_OTHER): Payer: Medicare HMO | Admitting: Hematology and Oncology

## 2018-11-09 DIAGNOSIS — R630 Anorexia: Secondary | ICD-10-CM

## 2018-11-09 DIAGNOSIS — R5383 Other fatigue: Secondary | ICD-10-CM | POA: Diagnosis not present

## 2018-11-09 DIAGNOSIS — C50511 Malignant neoplasm of lower-outer quadrant of right female breast: Secondary | ICD-10-CM | POA: Diagnosis not present

## 2018-11-09 DIAGNOSIS — Z79899 Other long term (current) drug therapy: Secondary | ICD-10-CM | POA: Diagnosis not present

## 2018-11-09 DIAGNOSIS — Z170 Estrogen receptor positive status [ER+]: Principal | ICD-10-CM

## 2018-11-09 DIAGNOSIS — C7951 Secondary malignant neoplasm of bone: Secondary | ICD-10-CM | POA: Diagnosis not present

## 2018-11-09 LAB — CBC WITH DIFFERENTIAL (CANCER CENTER ONLY)
ABS IMMATURE GRANULOCYTES: 0.01 10*3/uL (ref 0.00–0.07)
BASOS ABS: 0 10*3/uL (ref 0.0–0.1)
Basophils Relative: 1 %
Eosinophils Absolute: 0.1 10*3/uL (ref 0.0–0.5)
Eosinophils Relative: 3 %
HCT: 36.7 % (ref 36.0–46.0)
Hemoglobin: 12.4 g/dL (ref 12.0–15.0)
IMMATURE GRANULOCYTES: 0 %
Lymphocytes Relative: 37 %
Lymphs Abs: 1.2 10*3/uL (ref 0.7–4.0)
MCH: 33.2 pg (ref 26.0–34.0)
MCHC: 33.8 g/dL (ref 30.0–36.0)
MCV: 98.4 fL (ref 80.0–100.0)
MONOS PCT: 14 %
Monocytes Absolute: 0.5 10*3/uL (ref 0.1–1.0)
NEUTROS ABS: 1.4 10*3/uL — AB (ref 1.7–7.7)
NEUTROS PCT: 45 %
NRBC: 0 % (ref 0.0–0.2)
Platelet Count: 271 10*3/uL (ref 150–400)
RBC: 3.73 MIL/uL — ABNORMAL LOW (ref 3.87–5.11)
RDW: 14.4 % (ref 11.5–15.5)
WBC Count: 3.2 10*3/uL — ABNORMAL LOW (ref 4.0–10.5)

## 2018-11-09 LAB — CMP (CANCER CENTER ONLY)
ALK PHOS: 55 U/L (ref 38–126)
ALT: 14 U/L (ref 0–44)
ANION GAP: 10 (ref 5–15)
AST: 24 U/L (ref 15–41)
Albumin: 3.7 g/dL (ref 3.5–5.0)
BILIRUBIN TOTAL: 0.5 mg/dL (ref 0.3–1.2)
BUN: 15 mg/dL (ref 8–23)
CO2: 30 mmol/L (ref 22–32)
Calcium: 9.1 mg/dL (ref 8.9–10.3)
Chloride: 101 mmol/L (ref 98–111)
Creatinine: 0.78 mg/dL (ref 0.44–1.00)
GFR, Estimated: >60 mL/min (ref >60)
GLUCOSE: 95 mg/dL (ref 70–99)
POTASSIUM: 4.1 mmol/L (ref 3.5–5.1)
SODIUM: 141 mmol/L (ref 135–145)
TOTAL PROTEIN: 7.7 g/dL (ref 6.5–8.1)

## 2018-11-09 MED ORDER — DENOSUMAB 120 MG/1.7ML ~~LOC~~ SOLN
120.0000 mg | Freq: Once | SUBCUTANEOUS | Status: AC
  Administered 2018-11-09: 120 mg via SUBCUTANEOUS

## 2018-11-09 NOTE — Assessment & Plan Note (Signed)
Metastatic breast cancer PET-CT 01/14/16: Widespread metastatic disease right breast, right chest wall several nodules largest 3.7 cm, mediastinum, right internal mammary lymph node 2.2 cm, right pleural-based nodules 2.1cm and 2.7 cm, malignant pleural effusion, right common iliac lymph node, bone metastases in the thorax (sternum, manubrium, multiple thoracic vertebral bodies, multiple right-sided ribs)  Right chest wall mass biopsy 2:30 position 01/13/2016: Invasive ductal carcinoma focally involves skeletal muscle, grade 2, HER-2 negative ratio 1.08, ER/PR pending (Right breast cancer 1996 status post lumpectomy followed by chemotherapy followed by radiation and tamoxifen 5 years (details are not available)) ---------------------------------------------------------------------------------------------------------------------------------------------------------- Treatment plan: Ibrance with letrozole started 01/20/2016 , cycle 3 Ibrance 100 mg, Cycle 4 Ibrance 75 mg dose; switch to 75 mg 3 weeks on 2 weeks offfrom 07/22/2016, changed to 2 weeks on 2 weeks off12/12/2015.  CT chest abdomen pelvis 08/03/2018: Stable matted soft tissue density in the right internal mammary/anterior mediastinal region which may be treated tumor related to sternal disease.  Stable bone metastases no new lesions.  Labs, Xgeva, scans and MD visits and follow-up in 3 months.

## 2018-11-09 NOTE — Progress Notes (Signed)
Patient Care Team: [name], MD as PCP - General (Internal Medicine) [name], MD as Consulting Physician (Hematology and Oncology)  DIAGNOSIS:  Encounter Diagnosis  Name Primary?  . Malignant neoplasm of lower-outer quadrant of right breast of female, estrogen receptor positive ([name])     SUMMARY OF ONCOLOGIC HISTORY:   Breast cancer of lower-outer quadrant of right female breast ([name])   01/04/1995 Surgery    Right breast cancer status post lumpectomy followed by chemotherapy followed by radiation and tamoxifen 5 years (details are not available)    12/30/2015 Relapse/Recurrence    Chest wall/ soft tissue masses near the right side of the sternum, superior nodule measuring 14.5 mm, inferior nodule measuring 10.5 mm, 26 mm anterior mediastinal soft tissue mass, T9 bone metastases    12/30/2015 Procedure    Thoracentesis: 400 mL removed; showed reactive mesothelial cells, no cancer cells    12/30/2015 Imaging    CT chest: 26 x 17 mm anterior mediastinal soft tissue mass, mixed lytic and sclerotic bone mets mainly involving sternum and T9 vertebral body, scattered vertebral body involvement and rib involvement; chest wall soft tissue masses 14.5 mm, 10.5 mm    01/13/2016 Initial Biopsy    Right chest wall mass biopsy 2:30 position: Invasive ductal carcinoma focally involves skeletal muscle, grade 2, HER-2 negative ratio 1.08, ER/PR 95% positive    01/14/2016 PET scan    Widespread metastatic disease right breast, right chest wall, mediastinum, right internal mammary lymph nodes, right pleural space, malignant pleural effusion, right common iliac lymph node, bone metastases in the thorax    01/21/2016 -  Anti-estrogen oral therapy    Ibrance with letrozole    04/15/2017 - 04/23/2017 Hospital Admission    Pneumonia and confusion     CHIEF COMPLIANT: Follow-up of metastatic breast cancer on Ibrance with letrozole recent scans  INTERVAL HISTORY: Jessica Zavala is a 72 year old week  remains lady with above-mentioned history metastatic breast cancer who is been on [place] with letrozole since February 2017.  She will complete almost 3 years of therapy very soon.  She had recent scans and is here today to discuss the results.  She has been tolerating Ibrance fairly well.  She does have mild fatigue and mild decrease in appetite.  Denies any nausea vomiting denies any fevers or chills.  REVIEW OF SYSTEMS:   Constitutional: Denies fevers, chills or abnormal weight loss Eyes: Denies blurriness of vision Ears, nose, mouth, throat, and face: Denies mucositis or sore throat Respiratory: Denies cough, dyspnea or wheezes Cardiovascular: Denies palpitation, chest discomfort Gastrointestinal:  Denies nausea, heartburn or change in bowel habits Skin: Denies abnormal skin rashes Lymphatics: Denies new lymphadenopathy or easy bruising Neurological:Denies numbness, tingling or new weaknesses Behavioral/Psych: Mood is stable, no new changes  Extremities: No lower extremity edema   All other systems were reviewed with the patient and are negative.  I have reviewed the past medical history, past surgical history, social history and family history with the patient and they are unchanged from previous note.  ALLERGIES:  is allergic to aspirin.  MEDICATIONS:  Current Outpatient Medications  Medication Sig Dispense Refill  . atorvastatin (LIPITOR) 20 MG tablet Take 1 tablet (20 mg total) by mouth daily. 30 tablet 11  . betamethasone valerate ointment (VALISONE) 0.1 % Apply 1 application topically 2 (two) times daily. 30 g 0  . cephALEXin (KEFLEX) 500 MG capsule Take 1 capsule (500 mg total) by mouth 2 (two) times daily. 14 capsule 0  .  clobetasol cream (TEMOVATE) 9.32 % Apply 1 application topically 2 (two) times daily as needed. 30 g 0  . clotrimazole-betamethasone (LOTRISONE) cream Apply 1 application topically 2 (two) times daily as needed (skin irritation). 45 g 0  . Flurandrenolide 4  MCG/SQCM TAPE Apply 1 each topically at bedtime as needed and may repeat dose one time if needed. 1 each 1  . furosemide (LASIX) 20 MG tablet Take 1 tablet (20 mg total) by mouth daily. 30 tablet 0  . IBRANCE 75 MG capsule TAKE 1 CAPSULE BY MOUTH DAILY WITH FOOD. TAKE FOR 2 WEEKS ON 2 WEEKS OFF 14 capsule 6  . labetalol (NORMODYNE) 200 MG tablet Take 1 tablet (200 mg total) by mouth 2 (two) times daily. 30 tablet 1  . letrozole (FEMARA) 2.5 MG tablet TAKE 1 TABLET BY MOUTH ONCE DAILY 90 tablet 3   No current facility-administered medications for this visit.     PHYSICAL EXAMINATION: ECOG PERFORMANCE STATUS: 1 - Symptomatic but completely ambulatory  Vitals:   11/09/18 1016  BP: (!) 152/57  Pulse: 69  Resp: 18  Temp: 97.8 F (36.6 C)  SpO2: 96%   Filed Weights   11/09/18 1016  Weight: 119 lb 4.8 oz (54.1 kg)    GENERAL:alert, no distress and comfortable SKIN: skin color, texture, turgor are normal, no rashes or significant lesions EYES: normal, Conjunctiva are pink and non-injected, sclera clear OROPHARYNX:no exudate, no erythema and lips, buccal mucosa, and tongue normal  NECK: supple, thyroid normal size, non-tender, without nodularity LYMPH:  no palpable lymphadenopathy in the cervical, axillary or inguinal LUNGS: clear to auscultation and percussion with normal breathing effort HEART: regular rate & rhythm and no murmurs and no lower extremity edema ABDOMEN:abdomen soft, non-tender and normal bowel sounds MUSCULOSKELETAL:no cyanosis of digits and no clubbing  NEURO: alert & oriented x 3 with fluent speech, no focal motor/sensory deficits EXTREMITIES: No lower extremity edema  LABORATORY DATA:  I have reviewed the data as listed CMP Latest Ref Rng & Units 11/09/2018 08/10/2018 08/03/2018  Glucose 70 - 99 mg/dL 95 92 95  BUN 8 - 23 mg/dL _0 Creatinine 0.44 - 1.00 mg/dL 0.78 0.77 0.72  Sodium 135 - 145 mmol/L 141 139 138  Potassium 3.5 - 5.1 mmol/L 4.1 4.6 4.5    Chloride 98 - 111 mmol/L 101 101 102  CO2 22 - 32 mmol/L _1 Calcium 8.9 - 10.3 mg/dL 9.1 9.4 9.4  Total Protein 6.5 - 8.1 g/dL 7.7 7.8 8.0  Total Bilirubin 0.3 - 1.2 mg/dL 0.5 0.5 0.4  Alkaline Phos 38 - 126 U/L 55 57 59  AST 15 - 41 U/L _2 ALT 0 - 44 U/L _3 Lab Results  Component Value Date   WBC 3.2 (L) 11/09/2018   HGB 12.4 11/09/2018   HCT 36.7 11/09/2018   MCV 98.4 11/09/2018   PLT 271 11/09/2018   NEUTROABS 1.4 (L) 11/09/2018    ASSESSMENT & PLAN:  Breast cancer of lower-outer quadrant of right female breast ([place]) Metastatic breast cancer PET-CT 01/14/16: Widespread metastatic disease right breast, right chest wall several nodules largest 3.7 cm, mediastinum, right internal mammary lymph node 2.2 cm, right pleural-based nodules 2.1cm and 2.7 cm, malignant pleural effusion, right common iliac lymph node, bone metastases in the thorax (sternum, manubrium, multiple thoracic vertebral bodies, multiple right-sided ribs)  Right chest wall mass biopsy 2:30 position 01/13/2016: Invasive ductal carcinoma focally involves skeletal muscle, grade  2, HER-2 negative ratio 1.08, ER/PR pending (Right breast cancer 1996 status post lumpectomy followed by chemotherapy followed by radiation and tamoxifen 5 years (details are not available)) ---------------------------------------------------------------------------------------------------------------------------------------------------------- Treatment plan: Ibrance with letrozole started 01/20/2016 , cycle 3 Ibrance 100 mg, Cycle 4 Ibrance 75 mg dose; switch to 75 mg 3 weeks on 2 weeks offfrom 07/22/2016, changed to 2 weeks on 2 weeks off12/12/2015.  CT chest abdomen pelvis 08/03/2018: Stable matted soft tissue density in the right internal mammary/anterior mediastinal region which may be treated tumor related to sternal disease.  Stable bone metastases no new lesions.  ANC today 1.4  Labs, Xgeva and MD visits and  follow-up in 3 months.    No orders of the defined types were placed in this encounter.  The patient has a good understanding of the overall plan. she agrees with it. she will call with any problems that may develop before the next visit here.   Harriette Ohara, MD 11/09/18

## 2018-11-09 NOTE — Telephone Encounter (Signed)
Gave patient avs and calendar.   °

## 2018-11-16 ENCOUNTER — Telehealth: Payer: Self-pay

## 2018-11-16 NOTE — Telephone Encounter (Signed)
Oral Oncology Patient Advocate Encounter  I was successful at securing a grant with [employer] for $15,000. This will keep the out of pocket expense for Ibrance at $0. The grant information is as follows and has been shared with [place].  Approval dates: 11/01/18-11/01/19 ID: [id number] Group: [id number] BIN: [id number] PCN: [id number]  I called the patient and spoke to her son. He verbalized understanding and great appreciation   [place] Patient [place] Phone [phone] Fax [phone]

## 2018-11-23 DIAGNOSIS — D122 Benign neoplasm of ascending colon: Secondary | ICD-10-CM | POA: Diagnosis not present

## 2018-11-23 DIAGNOSIS — Z86010 Personal history of colonic polyps: Secondary | ICD-10-CM | POA: Diagnosis not present

## 2018-11-27 DIAGNOSIS — D122 Benign neoplasm of ascending colon: Secondary | ICD-10-CM | POA: Diagnosis not present

## 2018-11-30 MED FILL — IBRANCE 75 MG CAPSULE: 75 | 28 days supply | Qty: 14 | Fill #2

## 2018-12-14 DIAGNOSIS — C7951 Secondary malignant neoplasm of bone: Secondary | ICD-10-CM | POA: Diagnosis not present

## 2018-12-14 DIAGNOSIS — R0781 Pleurodynia: Secondary | ICD-10-CM | POA: Diagnosis not present

## 2018-12-14 DIAGNOSIS — I10 Essential (primary) hypertension: Secondary | ICD-10-CM | POA: Diagnosis not present

## 2018-12-14 DIAGNOSIS — E871 Hypo-osmolality and hyponatremia: Secondary | ICD-10-CM | POA: Diagnosis not present

## 2018-12-14 DIAGNOSIS — Z23 Encounter for immunization: Secondary | ICD-10-CM | POA: Diagnosis not present

## 2018-12-14 DIAGNOSIS — D702 Other drug-induced agranulocytosis: Secondary | ICD-10-CM | POA: Diagnosis not present

## 2018-12-14 DIAGNOSIS — C50511 Malignant neoplasm of lower-outer quadrant of right female breast: Secondary | ICD-10-CM | POA: Diagnosis not present

## 2018-12-14 DIAGNOSIS — D649 Anemia, unspecified: Secondary | ICD-10-CM | POA: Diagnosis not present

## 2018-12-14 DIAGNOSIS — E559 Vitamin D deficiency, unspecified: Secondary | ICD-10-CM | POA: Diagnosis not present

## 2019-01-14 MED FILL — IBRANCE 75 MG CAPSULE: 75 | 28 days supply | Qty: 14 | Fill #3

## 2019-01-24 DIAGNOSIS — Z779 Other contact with and (suspected) exposures hazardous to health: Secondary | ICD-10-CM | POA: Diagnosis not present

## 2019-01-24 DIAGNOSIS — Z6825 Body mass index (BMI) 25.0-25.9, adult: Secondary | ICD-10-CM | POA: Diagnosis not present

## 2019-01-25 ENCOUNTER — Other Ambulatory Visit: Payer: Self-pay | Admitting: Hematology and Oncology

## 2019-01-25 DIAGNOSIS — I10 Essential (primary) hypertension: Secondary | ICD-10-CM | POA: Diagnosis not present

## 2019-01-25 DIAGNOSIS — Z6825 Body mass index (BMI) 25.0-25.9, adult: Secondary | ICD-10-CM | POA: Diagnosis not present

## 2019-01-25 DIAGNOSIS — C50511 Malignant neoplasm of lower-outer quadrant of right female breast: Secondary | ICD-10-CM

## 2019-02-06 NOTE — Progress Notes (Signed)
Patient Care Team: [name], MD as PCP - General (Internal Medicine) [name], MD as Consulting Physician (Hematology and Oncology)  DIAGNOSIS:    ICD-10-CM   1. Bone metastases (H[facility]) C79.51 CT Abdomen Pelvis W Contrast    CT Chest W Contrast    NM Bone Scan Whole Body  2. Malignant neoplasm of lower-outer quadrant of right breast of female, estrogen receptor positive ([place]) C50.511 CT Abdomen Pelvis W Contrast   Z17.0 CT Chest W Contrast    NM Bone Scan Whole Body    SUMMARY OF ONCOLOGIC HISTORY:   Breast cancer of lower-outer quadrant of right female breast ([place])   01/04/1995 Surgery    Right breast cancer status post lumpectomy followed by chemotherapy followed by radiation and tamoxifen 5 years (details are not available)    12/30/2015 Relapse/Recurrence    Chest wall/ soft tissue masses near the right side of the sternum, superior nodule measuring 14.5 mm, inferior nodule measuring 10.5 mm, 26 mm anterior mediastinal soft tissue mass, T9 bone metastases    12/30/2015 Procedure    Thoracentesis: 400 mL removed; showed reactive mesothelial cells, no cancer cells    12/30/2015 Imaging    CT chest: 26 x 17 mm anterior mediastinal soft tissue mass, mixed lytic and sclerotic bone mets mainly involving sternum and T9 vertebral body, scattered vertebral body involvement and rib involvement; chest wall soft tissue masses 14.5 mm, 10.5 mm    01/13/2016 Initial Biopsy    Right chest wall mass biopsy 2:30 position: Invasive ductal carcinoma focally involves skeletal muscle, grade 2, HER-2 negative ratio 1.08, ER/PR 95% positive    01/14/2016 PET scan    Widespread metastatic disease right breast, right chest wall, mediastinum, right internal mammary lymph nodes, right pleural space, malignant pleural effusion, right common iliac lymph node, bone metastases in the thorax    01/21/2016 -  Anti-estrogen oral therapy    Ibrance with letrozole    04/15/2017 - 04/23/2017 Hospital Admission      Pneumonia and confusion     CHIEF COMPLIANT: Follow-up of metastatic breast cancer on Ibrance with letrozole  INTERVAL HISTORY: Jessica Zavala is a 73 y.o. with above-mentioned history of metastatic breast cancer who is been on Ibrance with letrozole since February 2017. She presents to the clinic today with her husband and is doing well. An interpreter was used for the visit. She reports normal appetite and is able to exercise regularly. She reports mild, occasional knee pain and joint stiffness that improves with ambulation. Her labs from today show: WBC 3.0, Hg 12.6, ANC 1.4, platelets 241.   REVIEW OF SYSTEMS:   Constitutional: Denies fevers, chills or abnormal weight loss Eyes: Denies blurriness of vision Ears, nose, mouth, throat, and face: Denies mucositis or sore throat Respiratory: Denies cough, dyspnea or wheezes Cardiovascular: Denies palpitation, chest discomfort Gastrointestinal: Denies nausea, heartburn or change in bowel habits Skin: Denies abnormal skin rashes MSK: (+) mild knee pain Lymphatics: Denies new lymphadenopathy or easy bruising Neurological: Denies numbness, tingling or new weaknesses Behavioral/Psych: Mood is stable, no new changes  Extremities: No lower extremity edema Breast: denies any pain or lumps or nodules in either breasts All other systems were reviewed with the patient and are negative.  I have reviewed the past medical history, past surgical history, social history and family history with the patient and they are unchanged from previous note.  ALLERGIES:  is allergic to aspirin.  MEDICATIONS:  Current Outpatient Medications  Medication Sig Dispense Refill  .  atorvastatin (LIPITOR) 20 MG tablet Take 1 tablet (20 mg total) by mouth daily. 30 tablet 11  . betamethasone valerate ointment (VALISONE) 0.1 % Apply 1 application topically 2 (two) times daily. 30 g 0  . cephALEXin (KEFLEX) 500 MG capsule Take 1 capsule (500 mg total) by mouth 2 (two)  times daily. 14 capsule 0  . clobetasol cream (TEMOVATE) 7.85 % Apply 1 application topically 2 (two) times daily as needed. 30 g 0  . clotrimazole-betamethasone (LOTRISONE) cream Apply 1 application topically 2 (two) times daily as needed (skin irritation). 45 g 0  . Flurandrenolide 4 MCG/SQCM TAPE Apply 1 each topically at bedtime as needed and may repeat dose one time if needed. 1 each 1  . furosemide (LASIX) 20 MG tablet Take 1 tablet (20 mg total) by mouth daily. 30 tablet 0  . IBRANCE 75 MG capsule TAKE 1 CAPSULE BY MOUTH DAILY WITH FOOD. TAKE FOR 2 WEEKS ON 2 WEEKS OFF 14 capsule 6  . labetalol (NORMODYNE) 200 MG tablet Take 1 tablet (200 mg total) by mouth 2 (two) times daily. 30 tablet 1  . letrozole (FEMARA) 2.5 MG tablet TAKE 1 TABLET BY MOUTH ONCE DAILY 90 tablet 0   No current facility-administered medications for this visit.     PHYSICAL EXAMINATION: ECOG PERFORMANCE STATUS: 1 - Symptomatic but completely ambulatory  Vitals:   02/08/19 0920  BP: (!) 145/86  Pulse: 72  Resp: 16  Temp: 97.7 F (36.5 C)  SpO2: 95%   Filed Weights   02/08/19 0920  Weight: 118 lb 12.8 oz (53.9 kg)    GENERAL: alert, no distress and comfortable SKIN: skin color, texture, turgor are normal, no rashes or significant lesions EYES: normal, Conjunctiva are pink and non-injected, sclera clear OROPHARYNX: no exudate, no erythema and lips, buccal mucosa, and tongue normal  NECK: supple, thyroid normal size, non-tender, without nodularity LYMPH: no palpable lymphadenopathy in the cervical, axillary or inguinal LUNGS: clear to auscultation and percussion with normal breathing effort HEART: regular rate & rhythm and no murmurs and no lower extremity edema ABDOMEN: abdomen soft, non-tender and normal bowel sounds MUSCULOSKELETAL: no cyanosis of digits and no clubbing  NEURO: alert & oriented x 3 with fluent speech, no focal motor/sensory deficits EXTREMITIES: No lower extremity edema  LABORATORY  DATA:  I have reviewed the data as listed CMP Latest Ref Rng & Units 02/08/2019 11/09/2018 08/10/2018  Glucose 70 - 99 mg/dL 93 95 92  BUN 8 - 23 mg/dL _0 Creatinine 0.44 - 1.00 mg/dL 0.69 0.78 0.77  Sodium 135 - 145 mmol/L 139 141 139  Potassium 3.5 - 5.1 mmol/L 4.2 4.1 4.6  Chloride 98 - 111 mmol/L 101 101 101  CO2 22 - 32 mmol/L _1 Calcium 8.9 - 10.3 mg/dL 9.2 9.1 9.4  Total Protein 6.5 - 8.1 g/dL 8.2(H) 7.7 7.8  Total Bilirubin 0.3 - 1.2 mg/dL 0.4 0.5 0.5  Alkaline Phos 38 - 126 U/L 52 55 57  AST 15 - 41 U/L _2 ALT 0 - 44 U/L _3 Lab Results  Component Value Date   WBC 3.0 (L) 02/08/2019   HGB 12.6 02/08/2019   HCT 37.5 02/08/2019   MCV 99.2 02/08/2019   PLT 241 02/08/2019   NEUTROABS 1.4 (L) 02/08/2019    ASSESSMENT & PLAN:  Breast cancer of lower-outer quadrant of right female breast ([place]) Metastatic breast cancer PET-CT 01/14/16: Widespread metastatic disease right breast,  right chest wall several nodules largest 3.7 cm, mediastinum, right internal mammary lymph node 2.2 cm, right pleural-based nodules 2.1cm and 2.7 cm, malignant pleural effusion, right common iliac lymph node, bone metastases in the thorax (sternum, manubrium, multiple thoracic vertebral bodies, multiple right-sided ribs)  Right chest wall mass biopsy 2:30 position 01/13/2016: Invasive ductal carcinoma focally involves skeletal muscle, grade 2, HER-2 negative ratio 1.08, ER/PR pending (Right breast cancer 1996 status post lumpectomy followed by chemotherapy followed by radiation and tamoxifen 5 years (details are not available)) ---------------------------------------------------------------------------------------------------------------------------------------------------------- Treatment plan: Ibrance with letrozole started 01/20/2016 , cycle 3 Ibrance 100 mg, Cycle 4 Ibrance 75 mg dose; switch to 75 mg 3 weeks on 2 weeks offfrom 07/22/2016, changed to 2 weeks on 2 weeks  off12/12/2015.  CT chest abdomen pelvis 08/03/2018: Stable matted soft tissue density in the right internal mammary/anterior mediastinal region which may be treated tumor related to sternal disease. Stable bone metastases no new lesions.  ANC today is 1.4  Labs, Xgeva and MD visitsand follow-up in 3 months. Return to clinic in 3 months with scans and follow-up    Orders Placed This Encounter  Procedures  . CT Abdomen Pelvis W Contrast    Standing Status:   Future    Standing Expiration Date:   02/08/2020    Order Specific Question:   ** REASON FOR EXAM (FREE TEXT)    Answer:   Metastatic breast cancer restaging    Order Specific Question:   If indicated for the ordered procedure, I authorize the administration of contrast media per Radiology protocol    Answer:   Yes    Order Specific Question:   Preferred imaging location?    Answer:   [facility]    Order Specific Question:   Is Oral Contrast requested for this exam?    Answer:   Yes, Per Radiology protocol    Order Specific Question:   Radiology Contrast Protocol - do NOT remove file path    Answer:   \\[email or web address]  . CT Chest W Contrast    Standing Status:   Future    Standing Expiration Date:   02/08/2020    Order Specific Question:   ** REASON FOR EXAM (FREE TEXT)    Answer:   Metastatic breast cancer restaging    Order Specific Question:   If indicated for the ordered procedure, I authorize the administration of contrast media per Radiology protocol    Answer:   Yes    Order Specific Question:   Preferred imaging location?    Answer:   [facility]    Order Specific Question:   Radiology Contrast Protocol - do NOT remove file path    Answer:   \\[email or web address]  . NM Bone Scan Whole Body    Standing Status:   Future    Standing Expiration Date:   02/08/2020    Order Specific Question:   ** REASON FOR EXAM (FREE TEXT)    Answer:   Met breast  cancer restaging    Order Specific Question:   If indicated for the ordered procedure, I authorize the administration of a radiopharmaceutical per Radiology protocol    Answer:   Yes    Order Specific Question:   Preferred imaging location?    Answer:   [facility]    Order Specific Question:   Radiology Contrast Protocol - do NOT remove file path    Answer:   \\[email or web address]   The patient has a good understanding of  the overall plan. she agrees with it. she will call with any problems that may develop before the next visit here.  [name], MD 02/08/2019  Julious Oka Dorshimer am acting as scribe for Dr. [name].  I have reviewed the above documentation for accuracy and completeness, and I agree with the above.

## 2019-02-08 ENCOUNTER — Inpatient Hospital Stay: Payer: Medicare HMO | Attending: Hematology and Oncology

## 2019-02-08 ENCOUNTER — Inpatient Hospital Stay (HOSPITAL_BASED_OUTPATIENT_CLINIC_OR_DEPARTMENT_OTHER): Payer: Medicare HMO | Admitting: Hematology and Oncology

## 2019-02-08 ENCOUNTER — Telehealth: Payer: Self-pay | Admitting: Hematology and Oncology

## 2019-02-08 ENCOUNTER — Inpatient Hospital Stay: Payer: Medicare HMO

## 2019-02-08 VITALS — BP 145/86 | HR 72 | Temp 97.7°F | Resp 16 | Ht <= 58 in | Wt 118.8 lb

## 2019-02-08 DIAGNOSIS — M25569 Pain in unspecified knee: Secondary | ICD-10-CM | POA: Diagnosis not present

## 2019-02-08 DIAGNOSIS — C50511 Malignant neoplasm of lower-outer quadrant of right female breast: Secondary | ICD-10-CM | POA: Insufficient documentation

## 2019-02-08 DIAGNOSIS — C7951 Secondary malignant neoplasm of bone: Secondary | ICD-10-CM | POA: Diagnosis not present

## 2019-02-08 DIAGNOSIS — M2550 Pain in unspecified joint: Secondary | ICD-10-CM

## 2019-02-08 DIAGNOSIS — Z170 Estrogen receptor positive status [ER+]: Secondary | ICD-10-CM

## 2019-02-08 LAB — CBC WITH DIFFERENTIAL (CANCER CENTER ONLY)
Abs Immature Granulocytes: 0 10*3/uL (ref 0.00–0.07)
BASOS ABS: 0.1 10*3/uL (ref 0.0–0.1)
Basophils Relative: 2 %
Eosinophils Absolute: 0.1 10*3/uL (ref 0.0–0.5)
Eosinophils Relative: 3 %
HEMATOCRIT: 37.5 % (ref 36.0–46.0)
HEMOGLOBIN: 12.6 g/dL (ref 12.0–15.0)
IMMATURE GRANULOCYTES: 0 %
LYMPHS ABS: 1.2 10*3/uL (ref 0.7–4.0)
LYMPHS PCT: 40 %
MCH: 33.3 pg (ref 26.0–34.0)
MCHC: 33.6 g/dL (ref 30.0–36.0)
MCV: 99.2 fL (ref 80.0–100.0)
MONOS PCT: 11 %
Monocytes Absolute: 0.3 10*3/uL (ref 0.1–1.0)
NEUTROS PCT: 44 %
NRBC: 0 % (ref 0.0–0.2)
Neutro Abs: 1.4 10*3/uL — ABNORMAL LOW (ref 1.7–7.7)
Platelet Count: 241 10*3/uL (ref 150–400)
RBC: 3.78 MIL/uL — ABNORMAL LOW (ref 3.87–5.11)
RDW: 14.6 % (ref 11.5–15.5)
WBC Count: 3 10*3/uL — ABNORMAL LOW (ref 4.0–10.5)

## 2019-02-08 LAB — COMPREHENSIVE METABOLIC PANEL
ALBUMIN: 4.2 g/dL (ref 3.5–5.0)
ALK PHOS: 52 U/L (ref 38–126)
ALT: 14 U/L (ref 0–44)
AST: 25 U/L (ref 15–41)
Anion gap: 7 (ref 5–15)
BILIRUBIN TOTAL: 0.4 mg/dL (ref 0.3–1.2)
BUN: 15 mg/dL (ref 8–23)
CALCIUM: 9.2 mg/dL (ref 8.9–10.3)
CO2: 31 mmol/L (ref 22–32)
CREATININE: 0.69 mg/dL (ref 0.44–1.00)
Chloride: 101 mmol/L (ref 98–111)
GFR calc Af Amer: >60 mL/min (ref >60)
GFR calc non Af Amer: >60 mL/min (ref >60)
GLUCOSE: 93 mg/dL (ref 70–99)
POTASSIUM: 4.2 mmol/L (ref 3.5–5.1)
Sodium: 139 mmol/L (ref 135–145)
TOTAL PROTEIN: 8.2 g/dL — AB (ref 6.5–8.1)

## 2019-02-08 MED ORDER — DENOSUMAB 120 MG/1.7ML ~~LOC~~ SOLN
SUBCUTANEOUS | Status: AC
  Filled 2019-02-08: qty 1.7

## 2019-02-08 MED ORDER — DENOSUMAB 120 MG/1.7ML ~~LOC~~ SOLN
120.0000 mg | Freq: Once | SUBCUTANEOUS | Status: AC
  Administered 2019-02-08: 120 mg via SUBCUTANEOUS

## 2019-02-08 MED FILL — IBRANCE 75 MG CAPSULE: 75 | 28 days supply | Qty: 14 | Fill #4

## 2019-02-08 NOTE — Telephone Encounter (Signed)
Gave avs and calendar ° °

## 2019-02-08 NOTE — Assessment & Plan Note (Signed)
Metastatic breast cancer PET-CT 01/14/16: Widespread metastatic disease right breast, right chest wall several nodules largest 3.7 cm, mediastinum, right internal mammary lymph node 2.2 cm, right pleural-based nodules 2.1cm and 2.7 cm, malignant pleural effusion, right common iliac lymph node, bone metastases in the thorax (sternum, manubrium, multiple thoracic vertebral bodies, multiple right-sided ribs)  Right chest wall mass biopsy 2:30 position 01/13/2016: Invasive ductal carcinoma focally involves skeletal muscle, grade 2, HER-2 negative ratio 1.08, ER/PR pending (Right breast cancer 1996 status post lumpectomy followed by chemotherapy followed by radiation and tamoxifen 5 years (details are not available)) ---------------------------------------------------------------------------------------------------------------------------------------------------------- Treatment plan: Ibrance with letrozole started 01/20/2016 , cycle 3 Ibrance 100 mg, Cycle 4 Ibrance 75 mg dose; switch to 75 mg 3 weeks on 2 weeks offfrom 07/22/2016, changed to 2 weeks on 2 weeks off12/12/2015.  CT chest abdomen pelvis 08/03/2018: Stable matted soft tissue density in the right internal mammary/anterior mediastinal region which may be treated tumor related to sternal disease. Stable bone metastases no new lesions.  ANC today   Labs, Xgeva and MD visitsand follow-up in 3 months. Return to clinic in 3 months with scans and follow-up

## 2019-03-04 MED FILL — IBRANCE 75 MG CAPSULE: 75 | 28 days supply | Qty: 14 | Fill #5

## 2019-04-04 MED FILL — IBRANCE 75 MG CAPSULE: 75 | 28 days supply | Qty: 14 | Fill #6

## 2019-04-26 ENCOUNTER — Other Ambulatory Visit: Payer: Self-pay | Admitting: Hematology and Oncology

## 2019-05-02 ENCOUNTER — Other Ambulatory Visit: Payer: Self-pay | Admitting: Hematology and Oncology

## 2019-05-02 DIAGNOSIS — C50511 Malignant neoplasm of lower-outer quadrant of right female breast: Secondary | ICD-10-CM

## 2019-05-02 MED FILL — IBRANCE 75 MG CAPSULE: 75 | 28 days supply | Qty: 14 | Fill #0

## 2019-05-10 ENCOUNTER — Inpatient Hospital Stay: Payer: Medicare HMO | Attending: Hematology and Oncology

## 2019-05-10 ENCOUNTER — Other Ambulatory Visit: Payer: Self-pay

## 2019-05-10 DIAGNOSIS — J910 Malignant pleural effusion: Secondary | ICD-10-CM | POA: Diagnosis not present

## 2019-05-10 DIAGNOSIS — C7951 Secondary malignant neoplasm of bone: Secondary | ICD-10-CM | POA: Diagnosis not present

## 2019-05-10 DIAGNOSIS — C50511 Malignant neoplasm of lower-outer quadrant of right female breast: Secondary | ICD-10-CM | POA: Insufficient documentation

## 2019-05-10 LAB — CBC WITH DIFFERENTIAL (CANCER CENTER ONLY)
Abs Immature Granulocytes: 0.01 10*3/uL (ref 0.00–0.07)
Basophils Absolute: 0.1 10*3/uL (ref 0.0–0.1)
Basophils Relative: 1 %
Eosinophils Absolute: 0.2 10*3/uL (ref 0.0–0.5)
Eosinophils Relative: 5 %
HCT: 34.8 % — ABNORMAL LOW (ref 36.0–46.0)
Hemoglobin: 11.4 g/dL — ABNORMAL LOW (ref 12.0–15.0)
Immature Granulocytes: 0 %
Lymphocytes Relative: 35 %
Lymphs Abs: 1.4 10*3/uL (ref 0.7–4.0)
MCH: 33.2 pg (ref 26.0–34.0)
MCHC: 32.8 g/dL (ref 30.0–36.0)
MCV: 101.5 fL — ABNORMAL HIGH (ref 80.0–100.0)
Monocytes Absolute: 0.5 10*3/uL (ref 0.1–1.0)
Monocytes Relative: 13 %
Neutro Abs: 1.8 10*3/uL (ref 1.7–7.7)
Neutrophils Relative %: 46 %
Platelet Count: 266 10*3/uL (ref 150–400)
RBC: 3.43 MIL/uL — ABNORMAL LOW (ref 3.87–5.11)
RDW: 15 % (ref 11.5–15.5)
WBC Count: 3.9 10*3/uL — ABNORMAL LOW (ref 4.0–10.5)
nRBC: 0 % (ref 0.0–0.2)

## 2019-05-10 LAB — CMP (CANCER CENTER ONLY)
ALT: 15 U/L (ref 0–44)
AST: 25 U/L (ref 15–41)
Albumin: 3.8 g/dL (ref 3.5–5.0)
Alkaline Phosphatase: 50 U/L (ref 38–126)
Anion gap: 8 (ref 5–15)
BUN: 21 mg/dL (ref 8–23)
CO2: 29 mmol/L (ref 22–32)
Calcium: 9.1 mg/dL (ref 8.9–10.3)
Chloride: 102 mmol/L (ref 98–111)
Creatinine: 0.81 mg/dL (ref 0.44–1.00)
GFR, Est AFR Am: >60 mL/min (ref >60)
GFR, Estimated: >60 mL/min (ref >60)
Glucose, Bld: 100 mg/dL — ABNORMAL HIGH (ref 70–99)
Potassium: 4.4 mmol/L (ref 3.5–5.1)
Sodium: 139 mmol/L (ref 135–145)
Total Bilirubin: 0.5 mg/dL (ref 0.3–1.2)
Total Protein: 7.7 g/dL (ref 6.5–8.1)

## 2019-05-13 ENCOUNTER — Encounter (HOSPITAL_COMMUNITY)
Admission: RE | Admit: 2019-05-13 | Discharge: 2019-05-13 | Disposition: A | Discharge status: Home / Self Care | Payer: Medicare HMO | Source: Ambulatory Visit | Attending: Hematology and Oncology | Admitting: Hematology and Oncology

## 2019-05-13 ENCOUNTER — Other Ambulatory Visit: Payer: Self-pay

## 2019-05-13 ENCOUNTER — Ambulatory Visit (HOSPITAL_COMMUNITY)
Admission: RE | Admit: 2019-05-13 | Discharge: 2019-05-13 | Disposition: A | Discharge status: Home / Self Care | Payer: Medicare HMO | Source: Ambulatory Visit | Attending: Hematology and Oncology | Admitting: Hematology and Oncology

## 2019-05-13 DIAGNOSIS — Z170 Estrogen receptor positive status [ER+]: Secondary | ICD-10-CM | POA: Diagnosis not present

## 2019-05-13 DIAGNOSIS — C50511 Malignant neoplasm of lower-outer quadrant of right female breast: Secondary | ICD-10-CM

## 2019-05-13 DIAGNOSIS — C7951 Secondary malignant neoplasm of bone: Secondary | ICD-10-CM

## 2019-05-13 DIAGNOSIS — C50911 Malignant neoplasm of unspecified site of right female breast: Secondary | ICD-10-CM | POA: Diagnosis not present

## 2019-05-13 MED ORDER — SODIUM CHLORIDE (PF) 0.9 % IJ SOLN
INTRAMUSCULAR | Status: AC
  Filled 2019-05-13: qty 50

## 2019-05-13 MED ORDER — TECHNETIUM TC 99M MEDRONATE IV KIT
21.6000 | PACK | Freq: Once | INTRAVENOUS | Status: AC
  Administered 2019-05-13: 11:00:00 21.6 via INTRAVENOUS

## 2019-05-13 MED ORDER — IOHEXOL 300 MG/ML  SOLN
100.0000 mL | Freq: Once | INTRAMUSCULAR | Status: AC | PRN
  Administered 2019-05-13: 80 mL via INTRAVENOUS

## 2019-05-13 NOTE — Assessment & Plan Note (Signed)
Metastatic breast cancer PET-CT 01/14/16: Widespread metastatic disease right breast, right chest wall several nodules largest 3.7 cm, mediastinum, right internal mammary lymph node 2.2 cm, right pleural-based nodules 2.1cm and 2.7 cm, malignant pleural effusion, right common iliac lymph node, bone metastases in the thorax (sternum, manubrium, multiple thoracic vertebral bodies, multiple right-sided ribs)  Right chest wall mass biopsy 2:30 position 01/13/2016: Invasive ductal carcinoma focally involves skeletal muscle, grade 2, HER-2 negative ratio 1.08, ER/PR pending (Right breast cancer 1996 status post lumpectomy followed by chemotherapy followed by radiation and tamoxifen 5 years (details are not available)) ---------------------------------------------------------------------------------------------------------------------------------------------------------- Treatment plan: Ibrance with letrozole started 01/20/2016 , cycle 3 Ibrance 100 mg, Cycle 4 Ibrance 75 mg dose; switch to 75 mg 3 weeks on 2 weeks offfrom 07/22/2016, changed to 2 weeks on 2 weeks off12/12/2015.  CT chest abdomen pelvis 05/13/2019:   ANC today is   Labs, Xgeva and MD visitsand follow-up in 3 months. Return to clinic in 3 months with labs and follow-up

## 2019-05-18 NOTE — Progress Notes (Signed)
Patient Care Team: [name], MD as PCP - General (Internal Medicine) [name], MD as Consulting Physician (Hematology and Oncology)  DIAGNOSIS:    ICD-10-CM   1. Malignant neoplasm of lower-outer quadrant of right breast of female, estrogen receptor positive ([place])  C50.511    Z17.0     SUMMARY OF ONCOLOGIC HISTORY: Oncology History  Breast cancer of lower-outer quadrant of right female breast ([place])  01/04/1995 Surgery   Right breast cancer status post lumpectomy followed by chemotherapy followed by radiation and tamoxifen 5 years (details are not available)   12/30/2015 Relapse/Recurrence   Chest wall/ soft tissue masses near the right side of the sternum, superior nodule measuring 14.5 mm, inferior nodule measuring 10.5 mm, 26 mm anterior mediastinal soft tissue mass, T9 bone metastases   12/30/2015 Procedure   Thoracentesis: 400 mL removed; showed reactive mesothelial cells, no cancer cells   12/30/2015 Imaging   CT chest: 26 x 17 mm anterior mediastinal soft tissue mass, mixed lytic and sclerotic bone mets mainly involving sternum and T9 vertebral body, scattered vertebral body involvement and rib involvement; chest wall soft tissue masses 14.5 mm, 10.5 mm   01/13/2016 Initial Biopsy   Right chest wall mass biopsy 2:30 position: Invasive ductal carcinoma focally involves skeletal muscle, grade 2, HER-2 negative ratio 1.08, ER/PR 95% positive   01/14/2016 PET scan   Widespread metastatic disease right breast, right chest wall, mediastinum, right internal mammary lymph nodes, right pleural space, malignant pleural effusion, right common iliac lymph node, bone metastases in the thorax   01/21/2016 -  Anti-estrogen oral therapy   Ibrance with letrozole   04/15/2017 - 04/23/2017 Hospital Admission   Pneumonia and confusion     CHIEF COMPLIANT: Follow-up of metastatic breast cancer on Ibrance with letrozole  INTERVAL HISTORY: Jessica Zavala is a 73 y.o. with above-mentioned  history of metastatic breast cancer who has been on Ibrance with letrozole since February 2017. CT CAP on 05/13/19 showed stable osseous metastases, 2.2cm soft tissue implant obstructing the right ureter and associated right moderate hydroureteronephrosis, suspicious for progressive metastasis, and a small right pleural effusion mildly increased. Bone scan on 05/13/19 showed progressive osseous metastatic disease involving the thoracic spine, multiple ribs, and manubrium. She presents to the clinic today for follow-up to review interval scans.   REVIEW OF SYSTEMS:   Constitutional: Denies fevers, chills or abnormal weight loss Eyes: Denies blurriness of vision Ears, nose, mouth, throat, and face: Denies mucositis or sore throat Respiratory: Denies cough, dyspnea or wheezes Cardiovascular: Denies palpitation, chest discomfort Gastrointestinal: Denies nausea, heartburn or change in bowel habits Skin: Denies abnormal skin rashes Lymphatics: Denies new lymphadenopathy or easy bruising Neurological: Denies numbness, tingling or new weaknesses Behavioral/Psych: Mood is stable, no new changes  Extremities: No lower extremity edema Breast: denies any pain or lumps or nodules in either breasts All other systems were reviewed with the patient and are negative.  I have reviewed the past medical history, past surgical history, social history and family history with the patient and they are unchanged from previous note.  ALLERGIES:  is allergic to aspirin.  MEDICATIONS:  Current Outpatient Medications  Medication Sig Dispense Refill  . atorvastatin (LIPITOR) 20 MG tablet Take 1 tablet (20 mg total) by mouth daily. 30 tablet 11  . betamethasone valerate ointment (VALISONE) 0.1 % Apply 1 application topically 2 (two) times daily. 30 g 0  . cephALEXin (KEFLEX) 500 MG capsule Take 1 capsule (500 mg total) by mouth 2 (two) times  daily. 14 capsule 0  . clobetasol cream (TEMOVATE) 8.65 % Apply 1 application  topically 2 (two) times daily as needed. 30 g 0  . clotrimazole-betamethasone (LOTRISONE) cream Apply 1 application topically 2 (two) times daily as needed (skin irritation). 45 g 0  . Flurandrenolide 4 MCG/SQCM TAPE Apply 1 each topically at bedtime as needed and may repeat dose one time if needed. 1 each 1  . furosemide (LASIX) 20 MG tablet Take 1 tablet (20 mg total) by mouth daily. 30 tablet 0  . IBRANCE 75 MG capsule TAKE 1 CAPSULE BY MOUTH DAILY WITH FOOD. TAKE FOR 2 WEEKS ON 2 WEEKS OFF 14 capsule 6  . labetalol (NORMODYNE) 200 MG tablet Take 1 tablet (200 mg total) by mouth 2 (two) times daily. 30 tablet 1  . letrozole (FEMARA) 2.5 MG tablet Take 1 tablet by mouth once daily 90 tablet 0   No current facility-administered medications for this visit.     PHYSICAL EXAMINATION: ECOG PERFORMANCE STATUS: 0 - Asymptomatic  There were no vitals filed for this visit. There were no vitals filed for this visit.  Exam not done due to COVID-19 precautions LABORATORY DATA:  I have reviewed the data as listed CMP Latest Ref Rng & Units 05/10/2019 02/08/2019 11/09/2018  Glucose 70 - 99 mg/dL 100(H) 93 95  BUN 8 - 23 mg/dL _0 Creatinine 0.44 - 1.00 mg/dL 0.81 0.69 0.78  Sodium 135 - 145 mmol/L 139 139 141  Potassium 3.5 - 5.1 mmol/L 4.4 4.2 4.1  Chloride 98 - 111 mmol/L 102 101 101  CO2 22 - 32 mmol/L _1 Calcium 8.9 - 10.3 mg/dL 9.1 9.2 9.1  Total Protein 6.5 - 8.1 g/dL 7.7 8.2(H) 7.7  Total Bilirubin 0.3 - 1.2 mg/dL 0.5 0.4 0.5  Alkaline Phos 38 - 126 U/L 50 52 55  AST 15 - 41 U/L _2 ALT 0 - 44 U/L _3 Lab Results  Component Value Date   WBC 3.9 (L) 05/10/2019   HGB 11.4 (L) 05/10/2019   HCT 34.8 (L) 05/10/2019   MCV 101.5 (H) 05/10/2019   PLT 266 05/10/2019   NEUTROABS 1.8 05/10/2019    ASSESSMENT & PLAN:  Breast cancer of lower-outer quadrant of right female breast ([place]) Metastatic breast cancer PET-CT 01/14/16: Widespread metastatic disease right  breast, right chest wall several nodules largest 3.7 cm, mediastinum, right internal mammary lymph node 2.2 cm, right pleural-based nodules 2.1cm and 2.7 cm, malignant pleural effusion, right common iliac lymph node, bone metastases in the thorax (sternum, manubrium, multiple thoracic vertebral bodies, multiple right-sided ribs)  Right chest wall mass biopsy 2:30 position 01/13/2016: Invasive ductal carcinoma focally involves skeletal muscle, grade 2, HER-2 negative ratio 1.08, ER/PR pending (Right breast cancer 1996 status post lumpectomy followed by chemotherapy followed by radiation and tamoxifen 5 years (details are not available)) ---------------------------------------------------------------------------------------------------------------------------------------------------------- Treatment plan: Ibrance with letrozole started 01/20/2016 , cycle 3 Ibrance 100 mg, Cycle 4 Ibrance 75 mg dose; switch to 75 mg 3 weeks on 2 weeks offfrom 07/22/2016, changed to 2 weeks on 2 weeks off12/12/2015.  CT chest abdomen pelvis 05/13/2019: 2.2 cm soft tissue implant obstructing the right ureter suspicious for metastasis, progressive associated with hydroureteronephrosis.  Bone metastases unchanged. Bone scan 05/13/2019: Progression from previous study with multiple new rib, manubrium and vertebral lesions identified.    Labs, Xgeva and MD visitsand follow-up in 3 months. Return to clinic in 3 months with labs  and follow-up I discussed with her for options 1.  Continue with current treatment and rescan in 3 months which is what she elected to do 2.  Clinical trial [place] 3.  Ibrance with Faslodex 4.  Exemestane and everolimus  We will see her back in 3 months after scans and see how she is doing    No orders of the defined types were placed in this encounter.  The patient has a good understanding of the overall plan. she agrees with it. she will call with any problems that may develop before the  next visit here.  [name], MD 05/20/2019  Julious Oka Dorshimer am acting as scribe for Dr. [name].  I have reviewed the above documentation for accuracy and completeness, and I agree with the above.

## 2019-05-20 ENCOUNTER — Other Ambulatory Visit: Payer: Self-pay

## 2019-05-20 ENCOUNTER — Inpatient Hospital Stay: Payer: Medicare HMO

## 2019-05-20 ENCOUNTER — Inpatient Hospital Stay (HOSPITAL_BASED_OUTPATIENT_CLINIC_OR_DEPARTMENT_OTHER): Payer: Medicare HMO | Admitting: Hematology and Oncology

## 2019-05-20 VITALS — BP 159/62 | HR 65 | Temp 98.2°F | Resp 18 | Ht <= 58 in | Wt 121.5 lb

## 2019-05-20 DIAGNOSIS — J90 Pleural effusion, not elsewhere classified: Secondary | ICD-10-CM | POA: Diagnosis not present

## 2019-05-20 DIAGNOSIS — C50511 Malignant neoplasm of lower-outer quadrant of right female breast: Secondary | ICD-10-CM | POA: Diagnosis not present

## 2019-05-20 DIAGNOSIS — C7951 Secondary malignant neoplasm of bone: Secondary | ICD-10-CM

## 2019-05-20 DIAGNOSIS — J910 Malignant pleural effusion: Secondary | ICD-10-CM | POA: Diagnosis not present

## 2019-05-20 DIAGNOSIS — Z170 Estrogen receptor positive status [ER+]: Secondary | ICD-10-CM | POA: Diagnosis not present

## 2019-05-20 DIAGNOSIS — N1330 Unspecified hydronephrosis: Secondary | ICD-10-CM

## 2019-05-20 DIAGNOSIS — N134 Hydroureter: Secondary | ICD-10-CM

## 2019-05-20 MED ORDER — DENOSUMAB 120 MG/1.7ML ~~LOC~~ SOLN
SUBCUTANEOUS | Status: AC
  Filled 2019-05-20: qty 1.7

## 2019-05-20 MED ORDER — DENOSUMAB 120 MG/1.7ML ~~LOC~~ SOLN
120.0000 mg | Freq: Once | SUBCUTANEOUS | Status: AC
  Administered 2019-05-20: 120 mg via SUBCUTANEOUS

## 2019-05-20 NOTE — Patient Instructions (Signed)
Denosumab injection ?y l thu?c g? DENOSUMAB lm ch?m s? suy y?u x??ng. Prolia ???c dng ?? ?i?u tr? ch?ng long x??ng ? ph? n? sau khi mn kinh v ? nam gi?i, v ? nh?ng ng??i dng cc thu?c corticosteroid trong 6 thng ho?c lu h?n. Xgeva ???c dng ?? ?i?u tr? tnh tr?ng t?ng cao calci v ung th? v ?? phng ng?a b? gy x??ng v cc v?n ?? v? x??ng khc gy ra do b?nh ?a u t?y ho?c cc di c?n x??ng trong ung th?[place] See c?ng ???c dng ?? ?i?u tr? b??u t? bo kh?ng l? (giant cell tumor) c?a x??ng. Thu?c ny c th? ???c dng cho nh?ng m?c ?ch khc; hy h?i ng??i cung c?p d?ch v? y t? ho?c d??c s? c?a mnh, n?u qu v? c th?c m?c. (CC) NHN HI?U PH? BI?N: Prolia, XGEVA Ti c?n ph?i bo cho ng??i cung c?p d?ch v? y t? c?a mnh ?i?u g tr??c khi dng thu?c ny? H? c?n bi?t li?u qu v? c b?t k? tnh tr?ng no sau ?y khng: -b?nh v? r?ng -???c gi?i ph?u ho?c nh? r?ng -nhi?m trng -b?nh th?n -m?c calci ho?c Vitamin D trong mu th?p -suy dinh d??ng -?ang ???c th?m phn -cc tnh tr?ng b?nh l ho?c m?n c?m c?a da -b?nh tuy?n gip tr?ng ho?c tuy?n c?n gip tr?ng -ph?n ?ng b?t th??ng v?i denosumab -pha?n ??ng b?t th???ng ho??c di? ??ng v??i ca?c d??c ph?m kha?c -pha?n ??ng b?t th???ng ho??c di? ??ng v??i th??c ph?m, thu?c nhu?m, ho??c ch?t ba?o qua?n -?ang c thai ho??c ??nh co? thai -?ang cho con bu? Ti nn s? d?ng thu?c ny nh? th? no? Thu?c ny ?? tim d??i da. Thu?c ny ???c s? d?ng b?i chuyn vin y t? ? b?nh vi?n ho?c ? phng m?ch. Qu v? s? ???c nh?n m?t B?n H??ng D?n v? D??c Ph?m (MedGuide) tr??c m?i l?n ?i?u tr?. Hy b?o ??m ??c k? thng tin ny m?i l?n. ??i v?i Prolia, hy bn v?i bc s? nhi khoa c?a qu v? v? vi?c dng thu?c ny ? tr? em. C th? c?n ch?m [place] ??c bi?t. ??i v?i Xgeva, hy bn v?i bc s? nhi khoa c?a qu v? v? vi?c dng thu?c ny ? tr? em. Thu?c ny c th? ???c k toa cho tr? em ch? m?i 13 tu?i trong nh?ng tr??ng h?p ch?n l?c, nh?ng c?n ph?i th?n tr?ng. Qu  li?u: N?u qu v? cho r?ng mnh ? dng qu nhi?u thu?c ny, th hy lin l?c v?i trung tm ki?m sot ch?t ??c ho?c phng c?p c?u ngay l?p t?c. L?U : Thu?c ny ch? dnh ring cho qu v?. Khng chia s? thu?c ny v?i nh?ng ng??i khc. N?u ti l? qun m?t li?u th sao? ?i?u quan tr?ng l khng nn b? l? li?u thu?c no. Hy lin l?c v?i bc s? ho?c [place] vin y t? c?a mnh, n?u qu v? khng th? gi? ?ng cu?c h?n khm. Nh?ng g c th? t??ng tc v?i thu?c ny? Khng ???c dng thu?c ny cng v?i b?t k? th? no sau ?y: -nh??ng thu?c kha?c co? ch??a denosumab Thu?c ny c?ng c th? t??ng tc v?i cc thu?c sau ?y: -nh?ng thu?c lm gi?m c? h?i ch?ng l?i b?nh nhi?m trng -cc thu?c steroid, ch?ng h?n nh? prednisone ho?c cortisone Danh sch ny c th? khng m t? ?? h?t cc t??ng tc c th? x?y ra. Hy ??a cho ng??i cung c?p d?ch v? y t? c?a mnh danh sch t?t c? cc thu?c, th?o d??c, cc thu?c  khng c?n toa, ho?c cc ch? ph?m b? sung m qu v? dng. C?ng nn bo cho h? bi?t r?ng qu v? c ht thu?c, u?ng r??u, ho?c c s? d?ng ma ty tri php hay khng. Vi th? c th? t??ng tc v?i thu?c c?a qu v?. Ti c?n ph?i theo di ?i?u g trong khi dng thu?c ny? Hy ?i g?p bc s? ho?c chuyn vin y t? ?? theo di ??nh k? s? c?i thi?n c?a qu v?. Bc s? c th? yu c?u th? mu v lm cc xt nghi?m khc ?? theo di s? c?i thi?n c?a qu v?. Hy h?i  ki?n bc s? ho?c chuyn vin y t?, n?u qu v? b? s?t, ?n l?nh ho?c ?au h?ng, ho?c c cc tri?u ch?ng khc c?a c?m l?nh ho?c cm. Khng ???c t? ?i?u tr? cho mnh. Thu?c ny c th? lm gi?m kh? n?ng ch?ng l?i cc b?nh nhi?m trng c?a c? th?. Hy c? trnh ? g?n nh?ng ng??i b? b?nh. Qu v? c?n ph?i ch?c ch?n r?ng c? th? qu v? nh?n ???c ??y ?? l??ng calci v vitamin D trong th?i gian dng thu?c ny, tr? khi bc s? yu c?u qu v? ??ng lm nh? v?y. Hy th?o lu?n v?i bc s? ho?c chuyn vin y t? v? nh?ng th?c ph?m qu v? ?n v nh?ng vitamin qu v? dng. Hy ?i nha s? th??ng xuyn.  ?nh r?ng ho?c x?a r?ng b?ng ch? nha khoa nh? ? ???c ch? d?n. N?u qu v? c ?i lm r?ng, th hy bo v?i nha s? r?ng qu v? ?ang dng thu?c ny. Khng ???c c thai trong th?i gian dng thu?c ny ho?c trong vng 5 thng sau khi ng?ng dng thu?c. Hy bn v?i bc s? ho?c chuyn vin y t? v? cc bi?n php ng?a thai trong th?i gian dng thu?c ny. Ph? n? c?n ph?i thng bo cho bc s? c?a mnh, n?u mu?n c thai ho?c ngh? r?ng c th? mnh ? c [place]. C nguy c? v? cc tc d?ng ph? nghim tr?ng ??i v?i [place] nhi. Hy th?o lu?n v?i bc s? ho?c chuyn vin y t? ho?c d??c s? ?? bi?t thm thng tin. Ti c th? nh?n th?y nh?ng tc d?ng ph? no khi dng thu?c ny? Nh?ng tc d?ng ph? qu v? c?n ph?i bo cho bc s? ho?c chuyn vin y t? cng s?m cng t?t: -cc ph?n ?ng d? ?ng, ch?ng h?n nh? da b? m?n ??, ng?a, n?i my ?ay, s?ng ? m?t, mi, ho?c l??i -?au x??ng -kh th? -chng m?t -?au hm, ??c bi?t l sau khi lm r?ng -b? ?? da, r?p da, bong da -cc d?u hi?u v tri?u ch?ng nhi?m trng, ch?ng h?n nh? s?t ho?c ?n l?nh; ho; ?au h?ng; kh ?i ti?u ho?c ?i ti?u ?au -cc d?u hi?u b? th?p calci, nh? l c nh?p tim nhanh, b? chu?t rt hay ?au b?p th?t; b? ?au, nh?c, b? t ? tay hay chn; b? co gi?t -b? b?m tm ho?c xu?t huy?t b?t th??ng -m?t m?i ho?c y?u ?t b?t th??ng Cc tc d?ng ph? khng c?n ph?i ch?m [place] y t? (hy bo cho bc s? ho?c chuyn vin y t?, n?u cc tc d?ng ph? ny ti?p di?n ho?c gy phi?n toi): -to bn -tiu ch?y -?au ??u -?au ho?c nh?c kh?p -m?t c?m gic ngon mi?ng -?au ho?c nh?c c? b?p -s? m?i -c?m th?y m?t m?i -kh ch?u ? bao t? Danh sch ny c th? khng m t? ?? h?t cc tc d?ng ph? c  th? x?y ra. Xin g?i t?i bc s? c?a mnh ?? ???c c? v?n chuyn mn v? cc tc d?ng ph?Sander Nephew v? c th? t??ng trnh cc tc d?ng ph? cho FDA theo s? [phone]. Ti nn c?t gi? thu?c c?a mnh ? ?u? Thu?c ny ch? ???c dng trong phng m?ch, v?n phng bc s? ho?c c? s? y t? khc v s? khng ???c c?t gi? t?i  nh. L?U : ?y l b?n tm t?t. N c th? khng bao hm t?t c? thng tin c th? c. N?u qu v? th?c m?c v? thu?c ny, xin trao ??i v?i bc s?, d??c s?, ho?c ng??i cung c?p d?ch v? y t? c?a mnh.  2019 Elsevier/Gold Standard (2018-05-07 00:00:00)

## 2019-05-23 NOTE — Progress Notes (Signed)
Referral information faxed to [facility] at [phone].

## 2019-05-30 MED FILL — IBRANCE 75 MG CAPSULE: 75 | 28 days supply | Qty: 14 | Fill #1

## 2019-06-19 ENCOUNTER — Telehealth: Payer: Self-pay | Admitting: Pharmacist

## 2019-06-19 DIAGNOSIS — C50511 Malignant neoplasm of lower-outer quadrant of right female breast: Secondary | ICD-10-CM

## 2019-06-19 DIAGNOSIS — Z170 Estrogen receptor positive status [ER+]: Secondary | ICD-10-CM

## 2019-06-19 MED ORDER — PALBOCICLIB 75 MG PO TABS: 75.0000 mg | ORAL_TABLET | Freq: Every day | ORAL | 6 refills | Status: DC

## 2019-06-19 NOTE — Telephone Encounter (Signed)
Oral Oncology Pharmacist Encounter  Ibrance (palbociclib) is changing formulation from capsules to tablets. New Ibrance prescription for the 75 mg tablets has been E scribed to the [place] [facility] outpatient pharmacy.  Johny Drilling, PharmD, BCPS, BCOP  06/19/2019  2:18 PM Oral Oncology Clinic [phone]

## 2019-06-21 DIAGNOSIS — E7849 Other hyperlipidemia: Secondary | ICD-10-CM | POA: Diagnosis not present

## 2019-06-21 DIAGNOSIS — M859 Disorder of bone density and structure, unspecified: Secondary | ICD-10-CM | POA: Diagnosis not present

## 2019-06-21 DIAGNOSIS — I10 Essential (primary) hypertension: Secondary | ICD-10-CM | POA: Diagnosis not present

## 2019-06-25 DIAGNOSIS — R946 Abnormal results of thyroid function studies: Secondary | ICD-10-CM | POA: Diagnosis not present

## 2019-06-28 DIAGNOSIS — Z0000 Encounter for general adult medical examination without abnormal findings: Secondary | ICD-10-CM | POA: Diagnosis not present

## 2019-06-28 DIAGNOSIS — R0781 Pleurodynia: Secondary | ICD-10-CM | POA: Diagnosis not present

## 2019-06-28 DIAGNOSIS — D6481 Anemia due to antineoplastic chemotherapy: Secondary | ICD-10-CM | POA: Diagnosis not present

## 2019-06-28 DIAGNOSIS — Z1331 Encounter for screening for depression: Secondary | ICD-10-CM | POA: Diagnosis not present

## 2019-06-28 DIAGNOSIS — N134 Hydroureter: Secondary | ICD-10-CM | POA: Diagnosis not present

## 2019-06-28 DIAGNOSIS — C50511 Malignant neoplasm of lower-outer quadrant of right female breast: Secondary | ICD-10-CM | POA: Diagnosis not present

## 2019-06-28 DIAGNOSIS — E871 Hypo-osmolality and hyponatremia: Secondary | ICD-10-CM | POA: Diagnosis not present

## 2019-06-28 DIAGNOSIS — D72819 Decreased white blood cell count, unspecified: Secondary | ICD-10-CM | POA: Diagnosis not present

## 2019-06-28 DIAGNOSIS — C7951 Secondary malignant neoplasm of bone: Secondary | ICD-10-CM | POA: Diagnosis not present

## 2019-06-28 DIAGNOSIS — E785 Hyperlipidemia, unspecified: Secondary | ICD-10-CM | POA: Diagnosis not present

## 2019-06-28 DIAGNOSIS — Z1339 Encounter for screening examination for other mental health and behavioral disorders: Secondary | ICD-10-CM | POA: Diagnosis not present

## 2019-07-02 DIAGNOSIS — R350 Frequency of micturition: Secondary | ICD-10-CM | POA: Diagnosis not present

## 2019-07-02 DIAGNOSIS — N130 Hydronephrosis with ureteropelvic junction obstruction: Secondary | ICD-10-CM | POA: Diagnosis not present

## 2019-07-04 MED FILL — IBRANCE 75 MG TABS: 75 | 28 days supply | Qty: 14 | Fill #0

## 2019-07-30 MED FILL — IBRANCE 75 MG TABS: 75 | 28 days supply | Qty: 14 | Fill #1

## 2019-08-08 ENCOUNTER — Other Ambulatory Visit: Payer: Self-pay | Admitting: Hematology and Oncology

## 2019-08-08 DIAGNOSIS — C50511 Malignant neoplasm of lower-outer quadrant of right female breast: Secondary | ICD-10-CM

## 2019-08-19 ENCOUNTER — Other Ambulatory Visit: Payer: Self-pay | Admitting: *Deleted

## 2019-08-19 ENCOUNTER — Encounter (HOSPITAL_COMMUNITY)
Admission: RE | Admit: 2019-08-19 | Discharge: 2019-08-19 | Disposition: A | Discharge status: Home / Self Care | Payer: Medicare HMO | Source: Ambulatory Visit | Attending: Hematology and Oncology | Admitting: Hematology and Oncology

## 2019-08-19 ENCOUNTER — Other Ambulatory Visit: Payer: Self-pay

## 2019-08-19 DIAGNOSIS — C7951 Secondary malignant neoplasm of bone: Secondary | ICD-10-CM | POA: Diagnosis not present

## 2019-08-19 DIAGNOSIS — C50511 Malignant neoplasm of lower-outer quadrant of right female breast: Secondary | ICD-10-CM

## 2019-08-19 DIAGNOSIS — Z170 Estrogen receptor positive status [ER+]: Secondary | ICD-10-CM | POA: Insufficient documentation

## 2019-08-19 DIAGNOSIS — Z853 Personal history of malignant neoplasm of breast: Secondary | ICD-10-CM | POA: Diagnosis not present

## 2019-08-19 MED ORDER — TECHNETIUM TC 99M MEDRONATE IV KIT
18.0000 | PACK | Freq: Once | INTRAVENOUS | Status: AC
  Administered 2019-08-19: 18 via INTRAVENOUS

## 2019-08-19 NOTE — Progress Notes (Signed)
Patient Care Team: [name], MD as PCP - General (Internal Medicine) [name], MD as Consulting Physician (Hematology and Oncology)  DIAGNOSIS:    ICD-10-CM   1. Malignant neoplasm of lower-outer quadrant of right breast of female, estrogen receptor positive ([place])  C50.511    Z17.0     SUMMARY OF ONCOLOGIC HISTORY: Oncology History  Breast cancer of lower-outer quadrant of right female breast ([place])  01/04/1995 Surgery   Right breast cancer status post lumpectomy followed by chemotherapy followed by radiation and tamoxifen 5 years (details are not available)   12/30/2015 Relapse/Recurrence   Chest wall/ soft tissue masses near the right side of the sternum, superior nodule measuring 14.5 mm, inferior nodule measuring 10.5 mm, 26 mm anterior mediastinal soft tissue mass, T9 bone metastases   12/30/2015 Procedure   Thoracentesis: 400 mL removed; showed reactive mesothelial cells, no cancer cells   12/30/2015 Imaging   CT chest: 26 x 17 mm anterior mediastinal soft tissue mass, mixed lytic and sclerotic bone mets mainly involving sternum and T9 vertebral body, scattered vertebral body involvement and rib involvement; chest wall soft tissue masses 14.5 mm, 10.5 mm   01/13/2016 Initial Biopsy   Right chest wall mass biopsy 2:30 position: Invasive ductal carcinoma focally involves skeletal muscle, grade 2, HER-2 negative ratio 1.08, ER/PR 95% positive   01/14/2016 PET scan   Widespread metastatic disease right breast, right chest wall, mediastinum, right internal mammary lymph nodes, right pleural space, malignant pleural effusion, right common iliac lymph node, bone metastases in the thorax   01/21/2016 -  Anti-estrogen oral therapy   Ibrance with letrozole   04/15/2017 - 04/23/2017 Hospital Admission   Pneumonia and confusion     CHIEF COMPLIANT: Follow-up of metastatic breast cancer on Ibrance with letrozole  INTERVAL HISTORY: Jessica Zavala is a 73 y.o. with above-mentioned  history of metastatic breast cancer who has been on [place] with letrozole since February 2017. Bone scan on 08/19/19 showed no change in metastatic disease and improved right hydronephrosis compared to prior scans. She presents to the clinic today for a toxicity check and to review her scans.  She is yet to undergo CT chest abdomen pelvis.  After this morning's clinic visit she will be heading to radiology for the scan.  She does not report any new or worsening symptoms or concerns.  Denies any fevers or chills.  REVIEW OF SYSTEMS:   Constitutional: Denies fevers, chills or abnormal weight loss Eyes: Denies blurriness of vision Ears, nose, mouth, throat, and face: Denies mucositis or sore throat Respiratory: Denies cough, dyspnea or wheezes Cardiovascular: Denies palpitation, chest discomfort Gastrointestinal: Denies nausea, heartburn or change in bowel habits Skin: Denies abnormal skin rashes Lymphatics: Denies new lymphadenopathy or easy bruising Neurological: Denies numbness, tingling or new weaknesses Behavioral/Psych: Mood is stable, no new changes  Extremities: No lower extremity edema Breast: denies any pain or lumps or nodules in either breasts All other systems were reviewed with the patient and are negative.  I have reviewed the past medical history, past surgical history, social history and family history with the patient and they are unchanged from previous note.  ALLERGIES:  is allergic to aspirin.  MEDICATIONS:  Current Outpatient Medications  Medication Sig Dispense Refill  . atorvastatin (LIPITOR) 20 MG tablet Take 1 tablet (20 mg total) by mouth daily. 30 tablet 11  . betamethasone valerate ointment (VALISONE) 0.1 % Apply 1 application topically 2 (two) times daily. 30 g 0  . cephALEXin (KEFLEX) 500  MG capsule Take 1 capsule (500 mg total) by mouth 2 (two) times daily. 14 capsule 0  . clobetasol cream (TEMOVATE) 0.23 % Apply 1 application topically 2 (two) times daily as  needed. 30 g 0  . clotrimazole-betamethasone (LOTRISONE) cream Apply 1 application topically 2 (two) times daily as needed (skin irritation). 45 g 0  . Flurandrenolide 4 MCG/SQCM TAPE Apply 1 each topically at bedtime as needed and may repeat dose one time if needed. 1 each 1  . furosemide (LASIX) 20 MG tablet Take 1 tablet (20 mg total) by mouth daily. 30 tablet 0  . labetalol (NORMODYNE) 200 MG tablet Take 1 tablet (200 mg total) by mouth 2 (two) times daily. 30 tablet 1  . letrozole (FEMARA) 2.5 MG tablet Take 1 tablet by mouth once daily 90 tablet 0  . palbociclib (IBRANCE) 75 MG tablet Take 1 tablet (75 mg total) by mouth daily. Take for 14 days on, 14 days off, repeat every 28 days. 14 tablet 6   No current facility-administered medications for this visit.     PHYSICAL EXAMINATION: ECOG PERFORMANCE STATUS: 1 - Symptomatic but completely ambulatory  Vitals:   08/20/19 1025  BP: (!) 114/99  Pulse: 69  Resp: 18  Temp: 97.6 F (36.4 C)  SpO2: 98%   Filed Weights   08/20/19 1025  Weight: 123 lb 1.6 oz (55.8 kg)    GENERAL: alert, no distress and comfortable SKIN: skin color, texture, turgor are normal, no rashes or significant lesions EYES: normal, Conjunctiva are pink and non-injected, sclera clear OROPHARYNX: no exudate, no erythema and lips, buccal mucosa, and tongue normal  NECK: supple, thyroid normal size, non-tender, without nodularity LYMPH: no palpable lymphadenopathy in the cervical, axillary or inguinal LUNGS: clear to auscultation and percussion with normal breathing effort HEART: regular rate & rhythm and no murmurs and no lower extremity edema ABDOMEN: abdomen soft, non-tender and normal bowel sounds MUSCULOSKELETAL: no cyanosis of digits and no clubbing  NEURO: alert & oriented x 3 with fluent speech, no focal motor/sensory deficits EXTREMITIES: No lower extremity edema  LABORATORY DATA:  I have reviewed the data as listed CMP Latest Ref Rng & Units  08/20/2019 05/10/2019 02/08/2019  Glucose 70 - 99 mg/dL 96 100(H) 93  BUN 8 - 23 mg/dL 24(H) 21 15  Creatinine 0.44 - 1.00 mg/dL 1.13(H) 0.81 0.69  Sodium 135 - 145 mmol/L 139 139 139  Potassium 3.5 - 5.1 mmol/L 4.0 4.4 4.2  Chloride 98 - 111 mmol/L 102 102 101  CO2 22 - 32 mmol/L '30 29 31  ' Calcium 8.9 - 10.3 mg/dL 9.5 9.1 9.2  Total Protein 6.5 - 8.1 g/dL 8.0 7.7 8.2(H)  Total Bilirubin 0.3 - 1.2 mg/dL 0.6 0.5 0.4  Alkaline Phos 38 - 126 U/L 51 50 52  AST 15 - 41 U/L '25 25 25  ' ALT 0 - 44 U/L '9 15 14    ' Lab Results  Component Value Date   WBC 2.8 (L) 08/20/2019   HGB 11.3 (L) 08/20/2019   HCT 33.1 (L) 08/20/2019   MCV 99.1 08/20/2019   PLT 275 08/20/2019   NEUTROABS 1.0 (L) 08/20/2019    ASSESSMENT & PLAN:  Breast cancer of lower-outer quadrant of right female breast ([place]) Metastatic breast cancer PET-CT 01/14/16: Widespread metastatic disease right breast, right chest wall several nodules largest 3.7 cm, mediastinum, right internal mammary lymph node 2.2 cm, right pleural-based nodules 2.1cm and 2.7 cm, malignant pleural effusion, right common iliac lymph node, bone metastases  in the thorax (sternum, manubrium, multiple thoracic vertebral bodies, multiple right-sided ribs)  Right chest wall mass biopsy 2:30 position 01/13/2016: Invasive ductal carcinoma focally involves skeletal muscle, grade 2, HER-2 negative ratio 1.08, ER/PR pending (Right breast cancer 1996 status post lumpectomy followed by chemotherapy followed by radiation and tamoxifen 5 years (details are not available)) ---------------------------------------------------------------------------------------------------------------------------------------------------------- Treatment plan: Ibrance with letrozole started 01/20/2016 , cycle 3 Ibrance 100 mg, Cycle 4 Ibrance 75 mg dose; switch to 75 mg 3 weeks on 2 weeks offfrom 07/22/2016, changed to 2 weeks on 2 weeks off12/12/2015.  CT chest abdomen pelvis 05/13/2019: 2.2 cm  soft tissue implant obstructing the right ureter suspicious for metastasis, progressive associated with hydroureteronephrosis.  Bone metastases unchanged. Bone scan 05/13/2019: Progression from previous study with multiple new rib, manubrium and vertebral lesions identified.    Labs, Xgeva and MD visitsand follow-up in 3 months.  Future treatment options 1.  Continue with current treatment (depending on the results of the CT scan) 2.  Clinical trial [place] 3.  Ibrance with Faslodex 4.  Exemestane and everolimus  CT CAP 08/20/19: It is being performed today. Bone scan done on 08/19/2019: Stable  Return to clinic in 3 months with labs and follow-up in for Xgeva injection.  No orders of the defined types were placed in this encounter.  The patient has a good understanding of the overall plan. she agrees with it. she will call with any problems that may develop before the next visit here.  [name], MD 08/20/2019  Julious Oka Dorshimer am acting as scribe for Dr. [name].  I have reviewed the above documentation for accuracy and completeness, and I agree with the above.

## 2019-08-20 ENCOUNTER — Inpatient Hospital Stay: Payer: Medicare HMO

## 2019-08-20 ENCOUNTER — Inpatient Hospital Stay: Payer: Medicare HMO | Attending: Hematology and Oncology

## 2019-08-20 ENCOUNTER — Ambulatory Visit (HOSPITAL_COMMUNITY)
Admission: RE | Admit: 2019-08-20 | Discharge: 2019-08-20 | Disposition: A | Discharge status: Home / Self Care | Payer: Medicare HMO | Source: Ambulatory Visit | Attending: Hematology and Oncology | Admitting: Hematology and Oncology

## 2019-08-20 ENCOUNTER — Inpatient Hospital Stay (HOSPITAL_BASED_OUTPATIENT_CLINIC_OR_DEPARTMENT_OTHER): Payer: Medicare HMO | Admitting: Hematology and Oncology

## 2019-08-20 ENCOUNTER — Other Ambulatory Visit: Payer: Self-pay

## 2019-08-20 DIAGNOSIS — Z23 Encounter for immunization: Secondary | ICD-10-CM | POA: Diagnosis not present

## 2019-08-20 DIAGNOSIS — C50911 Malignant neoplasm of unspecified site of right female breast: Secondary | ICD-10-CM | POA: Diagnosis not present

## 2019-08-20 DIAGNOSIS — Z170 Estrogen receptor positive status [ER+]: Secondary | ICD-10-CM

## 2019-08-20 DIAGNOSIS — C7951 Secondary malignant neoplasm of bone: Secondary | ICD-10-CM | POA: Diagnosis not present

## 2019-08-20 DIAGNOSIS — Z79899 Other long term (current) drug therapy: Secondary | ICD-10-CM | POA: Insufficient documentation

## 2019-08-20 DIAGNOSIS — C50511 Malignant neoplasm of lower-outer quadrant of right female breast: Secondary | ICD-10-CM

## 2019-08-20 DIAGNOSIS — Z79811 Long term (current) use of aromatase inhibitors: Secondary | ICD-10-CM | POA: Insufficient documentation

## 2019-08-20 DIAGNOSIS — C669 Malignant neoplasm of unspecified ureter: Secondary | ICD-10-CM | POA: Diagnosis not present

## 2019-08-20 LAB — CBC WITH DIFFERENTIAL (CANCER CENTER ONLY)
Abs Immature Granulocytes: 0.01 10*3/uL (ref 0.00–0.07)
Basophils Absolute: 0 10*3/uL (ref 0.0–0.1)
Basophils Relative: 1 %
Eosinophils Absolute: 0.2 10*3/uL (ref 0.0–0.5)
Eosinophils Relative: 9 %
HCT: 33.1 % — ABNORMAL LOW (ref 36.0–46.0)
Hemoglobin: 11.3 g/dL — ABNORMAL LOW (ref 12.0–15.0)
Immature Granulocytes: 0 %
Lymphocytes Relative: 48 %
Lymphs Abs: 1.4 10*3/uL (ref 0.7–4.0)
MCH: 33.8 pg (ref 26.0–34.0)
MCHC: 34.1 g/dL (ref 30.0–36.0)
MCV: 99.1 fL (ref 80.0–100.0)
Monocytes Absolute: 0.2 10*3/uL (ref 0.1–1.0)
Monocytes Relative: 7 %
Neutro Abs: 1 10*3/uL — ABNORMAL LOW (ref 1.7–7.7)
Neutrophils Relative %: 35 %
Platelet Count: 275 10*3/uL (ref 150–400)
RBC: 3.34 MIL/uL — ABNORMAL LOW (ref 3.87–5.11)
RDW: 14.3 % (ref 11.5–15.5)
WBC Count: 2.8 10*3/uL — ABNORMAL LOW (ref 4.0–10.5)
nRBC: 0 % (ref 0.0–0.2)

## 2019-08-20 LAB — CMP (CANCER CENTER ONLY)
ALT: 9 U/L (ref 0–44)
AST: 25 U/L (ref 15–41)
Albumin: 4.1 g/dL (ref 3.5–5.0)
Alkaline Phosphatase: 51 U/L (ref 38–126)
Anion gap: 7 (ref 5–15)
BUN: 24 mg/dL — ABNORMAL HIGH (ref 8–23)
CO2: 30 mmol/L (ref 22–32)
Calcium: 9.5 mg/dL (ref 8.9–10.3)
Chloride: 102 mmol/L (ref 98–111)
Creatinine: 1.13 mg/dL — ABNORMAL HIGH (ref 0.44–1.00)
GFR, Est AFR Am: 55 mL/min — ABNORMAL LOW (ref >60)
GFR, Estimated: 48 mL/min — ABNORMAL LOW (ref >60)
Glucose, Bld: 96 mg/dL (ref 70–99)
Potassium: 4 mmol/L (ref 3.5–5.1)
Sodium: 139 mmol/L (ref 135–145)
Total Bilirubin: 0.6 mg/dL (ref 0.3–1.2)
Total Protein: 8 g/dL (ref 6.5–8.1)

## 2019-08-20 MED ORDER — SODIUM CHLORIDE (PF) 0.9 % IJ SOLN
INTRAMUSCULAR | Status: AC
  Filled 2019-08-20: qty 50

## 2019-08-20 MED ORDER — IOHEXOL 300 MG/ML  SOLN
100.0000 mL | Freq: Once | INTRAMUSCULAR | Status: AC | PRN
  Administered 2019-08-20: 100 mL via INTRAVENOUS

## 2019-08-20 MED ORDER — INFLUENZA VAC A&B SA ADJ QUAD 0.5 ML IM PRSY
0.5000 mL | PREFILLED_SYRINGE | Freq: Once | INTRAMUSCULAR | Status: AC
  Administered 2019-08-20: 11:00:00 0.5 mL via INTRAMUSCULAR

## 2019-08-20 MED ORDER — DENOSUMAB 120 MG/1.7ML ~~LOC~~ SOLN
120.0000 mg | Freq: Once | SUBCUTANEOUS | Status: AC
  Administered 2019-08-20: 11:00:00 120 mg via SUBCUTANEOUS

## 2019-08-20 MED ORDER — INFLUENZA VAC A&B SA ADJ QUAD 0.5 ML IM PRSY
PREFILLED_SYRINGE | INTRAMUSCULAR | Status: AC
  Filled 2019-08-20: qty 0.5

## 2019-08-20 MED ORDER — DENOSUMAB 120 MG/1.7ML ~~LOC~~ SOLN
SUBCUTANEOUS | Status: AC
  Filled 2019-08-20: qty 1.7

## 2019-08-20 NOTE — Patient Instructions (Addendum)
Denosumab injection What is this medicine? DENOSUMAB (den oh sue mab) slows bone breakdown. Prolia is used to treat osteoporosis in women after menopause and in men, and in people who are taking corticosteroids for 6 months or more. Xgeva is used to treat a high calcium level due to cancer and to prevent bone fractures and other bone problems caused by multiple myeloma or cancer bone metastases. Xgeva is also used to treat giant cell tumor of the bone. This medicine may be used for other purposes; ask your health care provider or pharmacist if you have questions. COMMON BRAND NAME(S): Prolia, XGEVA What should I tell my health care provider before I take this medicine? They need to know if you have any of these conditions:  dental disease  having surgery or tooth extraction  infection  kidney disease  low levels of calcium or Vitamin D in the blood  malnutrition  on hemodialysis  skin conditions or sensitivity  thyroid or parathyroid disease  an unusual reaction to denosumab, other medicines, foods, dyes, or preservatives  pregnant or trying to get pregnant  breast-feeding How should I use this medicine? This medicine is for injection under the skin. It is given by a health care professional in a hospital or clinic setting. A special MedGuide will be given to you before each treatment. Be sure to read this information carefully each time. For Prolia, talk to your pediatrician regarding the use of this medicine in children. Special care may be needed. For Xgeva, talk to your pediatrician regarding the use of this medicine in children. While this drug may be prescribed for children as young as 13 years for selected conditions, precautions do apply. Overdosage: If you think you have taken too much of this medicine contact a poison control center or emergency room at once. NOTE: This medicine is only for you. Do not share this medicine with others. What if I miss a dose? It is  important not to miss your dose. Call your doctor or health care professional if you are unable to keep an appointment. What may interact with this medicine? Do not take this medicine with any of the following medications:  other medicines containing denosumab This medicine may also interact with the following medications:  medicines that lower your chance of fighting infection  steroid medicines like prednisone or cortisone This list may not describe all possible interactions. Give your health care provider a list of all the medicines, herbs, non-prescription drugs, or dietary supplements you use. Also tell them if you smoke, drink alcohol, or use illegal drugs. Some items may interact with your medicine. What should I watch for while using this medicine? Visit your doctor or health care professional for regular checks on your progress. Your doctor or health care professional may order blood tests and other tests to see how you are doing. Call your doctor or health care professional for advice if you get a fever, chills or sore throat, or other symptoms of a cold or flu. Do not treat yourself. This drug may decrease your body's ability to fight infection. Try to avoid being around people who are sick. You should make sure you get enough calcium and vitamin D while you are taking this medicine, unless your doctor tells you not to. Discuss the foods you eat and the vitamins you take with your health care professional. See your dentist regularly. Brush and floss your teeth as directed. Before you have any dental work done, tell your dentist you are   receiving this medicine. Do not become pregnant while taking this medicine or for 5 months after stopping it. Talk with your doctor or health care professional about your birth control options while taking this medicine. Women should inform their doctor if they wish to become pregnant or think they might be pregnant. There is a potential for serious side  effects to an unborn child. Talk to your health care professional or pharmacist for more information. What side effects may I notice from receiving this medicine? Side effects that you should report to your doctor or health care professional as soon as possible:  allergic reactions like skin rash, itching or hives, swelling of the face, lips, or tongue  bone pain  breathing problems  dizziness  jaw pain, especially after dental work  redness, blistering, peeling of the skin  signs and symptoms of infection like fever or chills; cough; sore throat; pain or trouble passing urine  signs of low calcium like fast heartbeat, muscle cramps or muscle pain; pain, tingling, numbness in the hands or feet; seizures  unusual bleeding or bruising  unusually weak or tired Side effects that usually do not require medical attention (report to your doctor or health care professional if they continue or are bothersome):  constipation  diarrhea  headache  joint pain  loss of appetite  muscle pain  runny nose  tiredness  upset stomach This list may not describe all possible side effects. Call your doctor for medical advice about side effects. You may report side effects to FDA at 1-800-FDA-1088. Where should I keep my medicine? This medicine is only given in a clinic, doctor's office, or other health care setting and will not be stored at home. NOTE: This sheet is a summary. It may not cover all possible information. If you have questions about this medicine, talk to your doctor, pharmacist, or health care provider.  2020 Elsevier/Gold Standard (2018-03-30 16:10:44)  Influenza Virus Vaccine injection What is this medicine? INFLUENZA VIRUS VACCINE (in floo EN zuh VAHY ruhs vak SEEN) helps to reduce the risk of getting influenza also known as the flu. The vaccine only helps protect you against some strains of the flu. This medicine may be used for other purposes; ask your health care  provider or pharmacist if you have questions. COMMON BRAND NAME(S): Afluria, Afluria Quadrivalent, Agriflu, Alfuria, FLUAD, Fluarix, Fluarix Quadrivalent, Flublok, Flublok Quadrivalent, FLUCELVAX, Flulaval, Fluvirin, Fluzone, Fluzone High-Dose, Fluzone Intradermal What should I tell my health care provider before I take this medicine? They need to know if you have any of these conditions:  bleeding disorder like hemophilia  fever or infection  Guillain-Barre syndrome or other neurological problems  immune system problems  infection with the human immunodeficiency virus (HIV) or AIDS  low blood platelet counts  multiple sclerosis  an unusual or allergic reaction to influenza virus vaccine, latex, other medicines, foods, dyes, or preservatives. Different brands of vaccines contain different allergens. Some may contain latex or eggs. Talk to your doctor about your allergies to make sure that you get the right vaccine.  pregnant or trying to get pregnant  breast-feeding How should I use this medicine? This vaccine is for injection into a muscle or under the skin. It is given by a health care professional. A copy of Vaccine Information Statements will be given before each vaccination. Read this sheet carefully each time. The sheet may change frequently. Talk to your healthcare provider to see which vaccines are right for you. Some vaccines should not be   used in all age groups. Overdosage: If you think you have taken too much of this medicine contact a poison control center or emergency room at once. NOTE: This medicine is only for you. Do not share this medicine with others. What if I miss a dose? This does not apply. What may interact with this medicine?  chemotherapy or radiation therapy  medicines that lower your immune system like etanercept, anakinra, infliximab, and adalimumab  medicines that treat or prevent blood clots like warfarin  phenytoin  steroid medicines like  prednisone or cortisone  theophylline  vaccines This list may not describe all possible interactions. Give your health care provider a list of all the medicines, herbs, non-prescription drugs, or dietary supplements you use. Also tell them if you smoke, drink alcohol, or use illegal drugs. Some items may interact with your medicine. What should I watch for while using this medicine? Report any side effects that do not go away within 3 days to your doctor or health care professional. Call your health care provider if any unusual symptoms occur within 6 weeks of receiving this vaccine. You may still catch the flu, but the illness is not usually as bad. You cannot get the flu from the vaccine. The vaccine will not protect against colds or other illnesses that may cause fever. The vaccine is needed every year. What side effects may I notice from receiving this medicine? Side effects that you should report to your doctor or health care professional as soon as possible:  allergic reactions like skin rash, itching or hives, swelling of the face, lips, or tongue Side effects that usually do not require medical attention (report to your doctor or health care professional if they continue or are bothersome):  fever  headache  muscle aches and pains  pain, tenderness, redness, or swelling at the injection site  tiredness This list may not describe all possible side effects. Call your doctor for medical advice about side effects. You may report side effects to FDA at 1-800-FDA-1088. Where should I keep my medicine? The vaccine will be given by a health care professional in a clinic, pharmacy, doctor's office, or other health care setting. You will not be given vaccine doses to store at home. NOTE: This sheet is a summary. It may not cover all possible information. If you have questions about this medicine, talk to your doctor, pharmacist, or health care provider.  2020 Elsevier/Gold Standard  (2018-10-16 08:45:43)   

## 2019-08-20 NOTE — Assessment & Plan Note (Signed)
Metastatic breast cancer PET-CT 01/14/16: Widespread metastatic disease right breast, right chest wall several nodules largest 3.7 cm, mediastinum, right internal mammary lymph node 2.2 cm, right pleural-based nodules 2.1cm and 2.7 cm, malignant pleural effusion, right common iliac lymph node, bone metastases in the thorax (sternum, manubrium, multiple thoracic vertebral bodies, multiple right-sided ribs)  Right chest wall mass biopsy 2:30 position 01/13/2016: Invasive ductal carcinoma focally involves skeletal muscle, grade 2, HER-2 negative ratio 1.08, ER/PR pending (Right breast cancer 1996 status post lumpectomy followed by chemotherapy followed by radiation and tamoxifen 5 years (details are not available)) ---------------------------------------------------------------------------------------------------------------------------------------------------------- Treatment plan: Ibrance with letrozole started 01/20/2016 , cycle 3 Ibrance 100 mg, Cycle 4 Ibrance 75 mg dose; switch to 75 mg 3 weeks on 2 weeks offfrom 07/22/2016, changed to 2 weeks on 2 weeks off12/12/2015.  CT chest abdomen pelvis 05/13/2019: 2.2 cm soft tissue implant obstructing the right ureter suspicious for metastasis, progressive associated with hydroureteronephrosis.  Bone metastases unchanged. Bone scan 05/13/2019: Progression from previous study with multiple new rib, manubrium and vertebral lesions identified.    Labs, Xgeva and MD visitsand follow-up in 3 months. Return to clinic in 3 months with labs and follow-up I discussed with her for options 1.  Continue with current treatment and rescan in 3 months which is what she elected to do 2.  Clinical trial [place] 3.  Ibrance with Faslodex 4.  Exemestane and everolimus  CT CAP 08/20/19

## 2019-08-21 ENCOUNTER — Telehealth: Payer: Self-pay | Admitting: Hematology and Oncology

## 2019-08-21 NOTE — Telephone Encounter (Signed)
I maiedl the calendar

## 2019-08-21 NOTE — Progress Notes (Signed)
 Patient Care Team: Russo, John, MD as PCP - General (Internal Medicine) Gudena, Vinay, MD as Consulting Physician (Hematology and Oncology)  DIAGNOSIS:    ICD-10-CM   1. Bone metastases (HCC)  C79.51   2. Malignant neoplasm of lower-outer quadrant of right breast of female, estrogen receptor positive (HCC)  C50.511    Z17.0     SUMMARY OF ONCOLOGIC HISTORY: Oncology History  Breast cancer of lower-outer quadrant of right female breast (HCC)  01/04/1995 Surgery   Right breast cancer status post lumpectomy followed by chemotherapy followed by radiation and tamoxifen 5 years (details are not available)   12/30/2015 Relapse/Recurrence   Chest wall/ soft tissue masses near the right side of the sternum, superior nodule measuring 14.5 mm, inferior nodule measuring 10.5 mm, 26 mm anterior mediastinal soft tissue mass, T9 bone metastases   12/30/2015 Procedure   Thoracentesis: 400 mL removed; showed reactive mesothelial cells, no cancer cells   12/30/2015 Imaging   CT chest: 26 x 17 mm anterior mediastinal soft tissue mass, mixed lytic and sclerotic bone mets mainly involving sternum and T9 vertebral body, scattered vertebral body involvement and rib involvement; chest wall soft tissue masses 14.5 mm, 10.5 mm   01/13/2016 Initial Biopsy   Right chest wall mass biopsy 2:30 position: Invasive ductal carcinoma focally involves skeletal muscle, grade 2, HER-2 negative ratio 1.08, ER/PR 95% positive   01/14/2016 PET scan   Widespread metastatic disease right breast, right chest wall, mediastinum, right internal mammary lymph nodes, right pleural space, malignant pleural effusion, right common iliac lymph node, bone metastases in the thorax   01/21/2016 -  Anti-estrogen oral therapy   Ibrance with letrozole   04/15/2017 - 04/23/2017 Hospital Admission   Pneumonia and confusion     CHIEF COMPLIANT: Follow-up of metastatic breast cancer on Ibrance with letrozole  INTERVAL HISTORY: Jessica Zavala is  a 73 y.o. with above-mentioned history of metastatic breast cancer whohas been on Ibrance with letrozole since February 2017. CT CAP on 08/20/19 showed mild progression of metastatic disease, with the right mid ureter metastasis mildly increased in size and widespread patchy sclerotic osseous metastases in the axial skeleton increased in size in several thoracic and lumber vertebral lesions. She presents to the clinic today to review her CT scan and discuss further treatment.   REVIEW OF SYSTEMS:   Constitutional: Denies fevers, chills or abnormal weight loss Eyes: Denies blurriness of vision Ears, nose, mouth, throat, and face: Denies mucositis or sore throat Respiratory: Denies cough, dyspnea or wheezes Cardiovascular: Denies palpitation, chest discomfort Gastrointestinal: Denies nausea, heartburn or change in bowel habits Skin: Denies abnormal skin rashes Lymphatics: Denies new lymphadenopathy or easy bruising Neurological: Denies numbness, tingling or new weaknesses Behavioral/Psych: Mood is stable, no new changes  Extremities: No lower extremity edema Breast: denies any pain or lumps or nodules in either breasts All other systems were reviewed with the patient and are negative.  I have reviewed the past medical history, past surgical history, social history and family history with the patient and they are unchanged from previous note.  ALLERGIES:  is allergic to aspirin.  MEDICATIONS:  Current Outpatient Medications  Medication Sig Dispense Refill  . atorvastatin (LIPITOR) 20 MG tablet Take 1 tablet (20 mg total) by mouth daily. 30 tablet 11  . betamethasone valerate ointment (VALISONE) 0.1 % Apply 1 application topically 2 (two) times daily. 30 g 0  . cephALEXin (KEFLEX) 500 MG capsule Take 1 capsule (500 mg total) by mouth 2 (two) times   daily. 14 capsule 0  . clobetasol cream (TEMOVATE) 9.15 % Apply 1 application topically 2 (two) times daily as needed. 30 g 0  .  clotrimazole-betamethasone (LOTRISONE) cream Apply 1 application topically 2 (two) times daily as needed (skin irritation). 45 g 0  . Flurandrenolide 4 MCG/SQCM TAPE Apply 1 each topically at bedtime as needed and may repeat dose one time if needed. 1 each 1  . furosemide (LASIX) 20 MG tablet Take 1 tablet (20 mg total) by mouth daily. 30 tablet 0  . labetalol (NORMODYNE) 200 MG tablet Take 1 tablet (200 mg total) by mouth 2 (two) times daily. 30 tablet 1  . letrozole (FEMARA) 2.5 MG tablet Take 1 tablet by mouth once daily 90 tablet 0  . palbociclib (IBRANCE) 75 MG tablet Take 1 tablet (75 mg total) by mouth daily. Take for 14 days on, 14 days off, repeat every 28 days. 14 tablet 6   No current facility-administered medications for this visit.     PHYSICAL EXAMINATION: ECOG PERFORMANCE STATUS: 1 - Symptomatic but completely ambulatory  Vitals:   08/22/19 1216  BP: (!) 154/60  Pulse: 80  Resp: 17  Temp: 98.2 F (36.8 C)  SpO2: 95%   Filed Weights   08/22/19 1216  Weight: 122 lb 4.8 oz (55.5 kg)    GENERAL: alert, no distress and comfortable SKIN: skin color, texture, turgor are normal, no rashes or significant lesions EYES: normal, Conjunctiva are pink and non-injected, sclera clear OROPHARYNX: no exudate, no erythema and lips, buccal mucosa, and tongue normal  NECK: supple, thyroid normal size, non-tender, without nodularity LYMPH: no palpable lymphadenopathy in the cervical, axillary or inguinal LUNGS: clear to auscultation and percussion with normal breathing effort HEART: regular rate & rhythm and no murmurs and no lower extremity edema ABDOMEN: abdomen soft, non-tender and normal bowel sounds MUSCULOSKELETAL: no cyanosis of digits and no clubbing  NEURO: alert & oriented x 3 with fluent speech, no focal motor/sensory deficits EXTREMITIES: No lower extremity edema  LABORATORY DATA:  I have reviewed the data as listed CMP Latest Ref Rng & Units 08/20/2019 05/10/2019 02/08/2019   Glucose 70 - 99 mg/dL 96 100(H) 93  BUN 8 - 23 mg/dL 24(H) 21 15  Creatinine 0.44 - 1.00 mg/dL 1.13(H) 0.81 0.69  Sodium 135 - 145 mmol/L 139 139 139  Potassium 3.5 - 5.1 mmol/L 4.0 4.4 4.2  Chloride 98 - 111 mmol/L 102 102 101  CO2 22 - 32 mmol/L _0 Calcium 8.9 - 10.3 mg/dL 9.5 9.1 9.2  Total Protein 6.5 - 8.1 g/dL 8.0 7.7 8.2(H)  Total Bilirubin 0.3 - 1.2 mg/dL 0.6 0.5 0.4  Alkaline Phos 38 - 126 U/L 51 50 52  AST 15 - 41 U/L _1 ALT 0 - 44 U/L _2 Lab Results  Component Value Date   WBC 2.8 (L) 08/20/2019   HGB 11.3 (L) 08/20/2019   HCT 33.1 (L) 08/20/2019   MCV 99.1 08/20/2019   PLT 275 08/20/2019   NEUTROABS 1.0 (L) 08/20/2019    ASSESSMENT & PLAN:  Breast cancer of lower-outer quadrant of right female breast ([place]) Metastatic breast cancer PET-CT 01/14/16: Widespread metastatic disease right breast, right chest wall several nodules largest 3.7 cm, mediastinum, right internal mammary lymph node 2.2 cm, right pleural-based nodules 2.1cm and 2.7 cm, malignant pleural effusion, right common iliac lymph node, bone metastases in the thorax (sternum, manubrium, multiple thoracic vertebral bodies, multiple right-sided ribs)  Right chest wall mass biopsy 2:30 position 01/13/2016: Invasive ductal carcinoma focally involves skeletal muscle, grade 2, HER-2 negative ratio 1.08, ER/PR pending (Right breast cancer 1996 status post lumpectomy followed by chemotherapy followed by radiation and tamoxifen 5 years (details are not available)) ---------------------------------------------------------------------------------------------------------------------------------------------------------- Treatment plan: Ibrance with letrozole started 01/20/2016 , cycle 3 Ibrance 100 mg, Cycle 4 Ibrance 75 mg dose; switch to 75 mg 3 weeks on 2 weeks offfrom 07/22/2016, changed to 2 weeks on 2 weeks off12/12/2015.  CT CAP 08/20/2019: Mild progression of metastatic disease.   Periureteric soft tissue metastases in the right mid ureter mildly increased with stable right hydro-ureteral necrosis.  Widespread bone metastases increased in size and thoracic and lumbar vertebral lesions.  Stable right internal mammary soft tissue.  Radiology review: I discussed the results with the patient which seems to suggest slow progression of disease. Treatment plan: I discussed with her different options including clinical trial versus exemestane and everolimus.  We decided to go for Ibrance with Faslodex. We will get authorization for Faslodex and will initiate the therapy in a week.    No orders of the defined types were placed in this encounter.  The patient has a good understanding of the overall plan. she agrees with it. she will call with any problems that may develop before the next visit here.  [name], MD 08/22/2019  Julious Oka Dorshimer am acting as scribe for Dr. [name].  I have reviewed the above documentation for accuracy and completeness, and I agree with the above.

## 2019-08-22 ENCOUNTER — Other Ambulatory Visit: Payer: Self-pay

## 2019-08-22 ENCOUNTER — Inpatient Hospital Stay (HOSPITAL_BASED_OUTPATIENT_CLINIC_OR_DEPARTMENT_OTHER): Payer: Medicare HMO | Admitting: Hematology and Oncology

## 2019-08-22 VITALS — BP 154/60 | HR 80 | Temp 98.2°F | Resp 17 | Ht <= 58 in | Wt 122.3 lb

## 2019-08-22 DIAGNOSIS — C50511 Malignant neoplasm of lower-outer quadrant of right female breast: Secondary | ICD-10-CM

## 2019-08-22 DIAGNOSIS — Z170 Estrogen receptor positive status [ER+]: Secondary | ICD-10-CM

## 2019-08-22 DIAGNOSIS — C7951 Secondary malignant neoplasm of bone: Secondary | ICD-10-CM

## 2019-08-22 DIAGNOSIS — Z79899 Other long term (current) drug therapy: Secondary | ICD-10-CM | POA: Diagnosis not present

## 2019-08-22 DIAGNOSIS — Z23 Encounter for immunization: Secondary | ICD-10-CM | POA: Diagnosis not present

## 2019-08-22 DIAGNOSIS — Z79811 Long term (current) use of aromatase inhibitors: Secondary | ICD-10-CM | POA: Diagnosis not present

## 2019-08-22 NOTE — Assessment & Plan Note (Signed)
Metastatic breast cancer PET-CT 01/14/16: Widespread metastatic disease right breast, right chest wall several nodules largest 3.7 cm, mediastinum, right internal mammary lymph node 2.2 cm, right pleural-based nodules 2.1cm and 2.7 cm, malignant pleural effusion, right common iliac lymph node, bone metastases in the thorax (sternum, manubrium, multiple thoracic vertebral bodies, multiple right-sided ribs)  Right chest wall mass biopsy 2:30 position 01/13/2016: Invasive ductal carcinoma focally involves skeletal muscle, grade 2, HER-2 negative ratio 1.08, ER/PR pending (Right breast cancer 1996 status post lumpectomy followed by chemotherapy followed by radiation and tamoxifen 5 years (details are not available)) ---------------------------------------------------------------------------------------------------------------------------------------------------------- Treatment plan: Ibrance with letrozole started 01/20/2016 , cycle 3 Ibrance 100 mg, Cycle 4 Ibrance 75 mg dose; switch to 75 mg 3 weeks on 2 weeks offfrom 07/22/2016, changed to 2 weeks on 2 weeks off12/12/2015.  CT CAP 08/20/2019: Mild progression of metastatic disease.  Periureteric soft tissue metastases in the right mid ureter mildly increased with stable right hydro-ureteral necrosis.  Widespread bone metastases increased in size and thoracic and lumbar vertebral lesions.  Stable right internal mammary soft tissue.  Radiology review: I discussed the results with the patient which seems to suggest slow progression of disease. Treatment plan: I discussed with her different options including O clinical trial versus exemestane and everolimus.  We decided to go for Ibrance with Faslodex. We will get authorization for Faslodex and will initiate the therapy in a week.

## 2019-08-23 ENCOUNTER — Telehealth: Payer: Self-pay | Admitting: Hematology and Oncology

## 2019-08-23 NOTE — Telephone Encounter (Signed)
I mailed a calendar

## 2019-08-28 ENCOUNTER — Other Ambulatory Visit: Payer: Self-pay | Admitting: Hematology and Oncology

## 2019-08-28 DIAGNOSIS — C50511 Malignant neoplasm of lower-outer quadrant of right female breast: Secondary | ICD-10-CM

## 2019-08-28 DIAGNOSIS — Z170 Estrogen receptor positive status [ER+]: Secondary | ICD-10-CM

## 2019-08-28 MED ORDER — PALBOCICLIB 75 MG PO TABS: 75.0000 mg | ORAL_TABLET | Freq: Every day | ORAL | 6 refills | Status: DC

## 2019-08-29 ENCOUNTER — Other Ambulatory Visit: Payer: Self-pay

## 2019-08-29 ENCOUNTER — Inpatient Hospital Stay: Payer: Medicare HMO

## 2019-08-29 VITALS — BP 129/73 | HR 72 | Temp 98.3°F | Resp 18

## 2019-08-29 DIAGNOSIS — Z79811 Long term (current) use of aromatase inhibitors: Secondary | ICD-10-CM | POA: Diagnosis not present

## 2019-08-29 DIAGNOSIS — Z170 Estrogen receptor positive status [ER+]: Secondary | ICD-10-CM

## 2019-08-29 DIAGNOSIS — Z79899 Other long term (current) drug therapy: Secondary | ICD-10-CM | POA: Diagnosis not present

## 2019-08-29 DIAGNOSIS — C7951 Secondary malignant neoplasm of bone: Secondary | ICD-10-CM | POA: Diagnosis not present

## 2019-08-29 DIAGNOSIS — C50511 Malignant neoplasm of lower-outer quadrant of right female breast: Secondary | ICD-10-CM | POA: Diagnosis not present

## 2019-08-29 DIAGNOSIS — Z23 Encounter for immunization: Secondary | ICD-10-CM | POA: Diagnosis not present

## 2019-08-29 MED ORDER — FULVESTRANT 250 MG/5ML IM SOLN
500.0000 mg | Freq: Once | INTRAMUSCULAR | Status: AC
  Administered 2019-08-29: 500 mg via INTRAMUSCULAR

## 2019-08-29 MED ORDER — FULVESTRANT 250 MG/5ML IM SOLN
INTRAMUSCULAR | Status: AC
  Filled 2019-08-29: qty 5

## 2019-08-29 NOTE — Patient Instructions (Signed)
Fulvestrant injection What is this medicine? FULVESTRANT (ful VES trant) blocks the effects of estrogen. It is used to treat breast cancer. This medicine may be used for other purposes; ask your health care provider or pharmacist if you have questions. COMMON BRAND NAME(S): FASLODEX What should I tell my health care provider before I take this medicine? They need to know if you have any of these conditions:  bleeding disorders  liver disease  low blood counts, like low white cell, platelet, or red cell counts  an unusual or allergic reaction to fulvestrant, other medicines, foods, dyes, or preservatives  pregnant or trying to get pregnant  breast-feeding How should I use this medicine? This medicine is for injection into a muscle. It is usually given by a health care professional in a hospital or clinic setting. Talk to your pediatrician regarding the use of this medicine in children. Special care may be needed. Overdosage: If you think you have taken too much of this medicine contact a poison control center or emergency room at once. NOTE: This medicine is only for you. Do not share this medicine with others. What if I miss a dose? It is important not to miss your dose. Call your doctor or health care professional if you are unable to keep an appointment. What may interact with this medicine?  medicines that treat or prevent blood clots like warfarin, enoxaparin, dalteparin, apixaban, dabigatran, and rivaroxaban This list may not describe all possible interactions. Give your health care provider a list of all the medicines, herbs, non-prescription drugs, or dietary supplements you use. Also tell them if you smoke, drink alcohol, or use illegal drugs. Some items may interact with your medicine. What should I watch for while using this medicine? Your condition will be monitored carefully while you are receiving this medicine. You will need important blood work done while you are taking  this medicine. Do not become pregnant while taking this medicine or for at least 1 year after stopping it. Women of child-bearing potential will need to have a negative pregnancy test before starting this medicine. Women should inform their doctor if they wish to become pregnant or think they might be pregnant. There is a potential for serious side effects to an unborn child. Men should inform their doctors if they wish to father a child. This medicine may lower sperm counts. Talk to your health care professional or pharmacist for more information. Do not breast-feed an infant while taking this medicine or for 1 year after the last dose. What side effects may I notice from receiving this medicine? Side effects that you should report to your doctor or health care professional as soon as possible:  allergic reactions like skin rash, itching or hives, swelling of the face, lips, or tongue  feeling faint or lightheaded, falls  pain, tingling, numbness, or weakness in the legs  signs and symptoms of infection like fever or chills; cough; flu-like symptoms; sore throat  vaginal bleeding Side effects that usually do not require medical attention (report to your doctor or health care professional if they continue or are bothersome):  aches, pains  constipation  diarrhea  headache  hot flashes  nausea, vomiting  pain at site where injected  stomach pain This list may not describe all possible side effects. Call your doctor for medical advice about side effects. You may report side effects to FDA at 1-800-FDA-1088. Where should I keep my medicine? This drug is given in a hospital or clinic and will   not be stored at home. NOTE: This sheet is a summary. It may not cover all possible information. If you have questions about this medicine, talk to your doctor, pharmacist, or health care provider.  2020 Elsevier/Gold Standard (2018-03-01 11:34:41)  

## 2019-09-02 ENCOUNTER — Telehealth: Payer: Self-pay

## 2019-09-02 MED FILL — IBRANCE 75 MG TABS: 75 | 28 days supply | Qty: 14 | Fill #2

## 2019-09-02 NOTE — Telephone Encounter (Signed)
Oral Oncology Patient Advocate Encounter  I received notification from [place] that [facility] was rejecting that [facility] had filled the prescription. Lillie called [facility] and they could not back the prescription out without the patients consent.  I called [employer] and they called the patients son [name] while on talking to me. Roland gave them permission to back the claim out and cancel it as long as the [facility] will be coming from [employer]. [facility] will ship the [place] to the patient today, 9/28 for delivery tomorrow 9/29.  [place] [facility] [place] Phone [phone] Fax [phone] 09/02/2019   10:41 AM

## 2019-09-12 ENCOUNTER — Other Ambulatory Visit: Payer: Self-pay

## 2019-09-12 ENCOUNTER — Inpatient Hospital Stay: Payer: Medicare HMO | Attending: Hematology and Oncology

## 2019-09-12 VITALS — BP 133/66 | HR 72 | Temp 98.0°F | Resp 18

## 2019-09-12 DIAGNOSIS — Z170 Estrogen receptor positive status [ER+]: Secondary | ICD-10-CM | POA: Diagnosis not present

## 2019-09-12 DIAGNOSIS — Z79811 Long term (current) use of aromatase inhibitors: Secondary | ICD-10-CM | POA: Diagnosis not present

## 2019-09-12 DIAGNOSIS — C775 Secondary and unspecified malignant neoplasm of intrapelvic lymph nodes: Secondary | ICD-10-CM | POA: Diagnosis not present

## 2019-09-12 DIAGNOSIS — C50511 Malignant neoplasm of lower-outer quadrant of right female breast: Secondary | ICD-10-CM | POA: Insufficient documentation

## 2019-09-12 DIAGNOSIS — C7951 Secondary malignant neoplasm of bone: Secondary | ICD-10-CM | POA: Diagnosis not present

## 2019-09-12 MED ORDER — FULVESTRANT 250 MG/5ML IM SOLN
INTRAMUSCULAR | Status: AC
  Filled 2019-09-12: qty 5

## 2019-09-12 MED ORDER — FULVESTRANT 250 MG/5ML IM SOLN
500.0000 mg | Freq: Once | INTRAMUSCULAR | Status: AC
  Administered 2019-09-12: 500 mg via INTRAMUSCULAR

## 2019-09-12 NOTE — Patient Instructions (Signed)
Fulvestrant injection What is this medicine? FULVESTRANT (ful VES trant) blocks the effects of estrogen. It is used to treat breast cancer. This medicine may be used for other purposes; ask your health care provider or pharmacist if you have questions. COMMON BRAND NAME(S): FASLODEX What should I tell my health care provider before I take this medicine? They need to know if you have any of these conditions:  bleeding disorders  liver disease  low blood counts, like low white cell, platelet, or red cell counts  an unusual or allergic reaction to fulvestrant, other medicines, foods, dyes, or preservatives  pregnant or trying to get pregnant  breast-feeding How should I use this medicine? This medicine is for injection into a muscle. It is usually given by a health care professional in a hospital or clinic setting. Talk to your pediatrician regarding the use of this medicine in children. Special care may be needed. Overdosage: If you think you have taken too much of this medicine contact a poison control center or emergency room at once. NOTE: This medicine is only for you. Do not share this medicine with others. What if I miss a dose? It is important not to miss your dose. Call your doctor or health care professional if you are unable to keep an appointment. What may interact with this medicine?  medicines that treat or prevent blood clots like warfarin, enoxaparin, dalteparin, apixaban, dabigatran, and rivaroxaban This list may not describe all possible interactions. Give your health care provider a list of all the medicines, herbs, non-prescription drugs, or dietary supplements you use. Also tell them if you smoke, drink alcohol, or use illegal drugs. Some items may interact with your medicine. What should I watch for while using this medicine? Your condition will be monitored carefully while you are receiving this medicine. You will need important blood work done while you are taking  this medicine. Do not become pregnant while taking this medicine or for at least 1 year after stopping it. Women of child-bearing potential will need to have a negative pregnancy test before starting this medicine. Women should inform their doctor if they wish to become pregnant or think they might be pregnant. There is a potential for serious side effects to an unborn child. Men should inform their doctors if they wish to father a child. This medicine may lower sperm counts. Talk to your health care professional or pharmacist for more information. Do not breast-feed an infant while taking this medicine or for 1 year after the last dose. What side effects may I notice from receiving this medicine? Side effects that you should report to your doctor or health care professional as soon as possible:  allergic reactions like skin rash, itching or hives, swelling of the face, lips, or tongue  feeling faint or lightheaded, falls  pain, tingling, numbness, or weakness in the legs  signs and symptoms of infection like fever or chills; cough; flu-like symptoms; sore throat  vaginal bleeding Side effects that usually do not require medical attention (report to your doctor or health care professional if they continue or are bothersome):  aches, pains  constipation  diarrhea  headache  hot flashes  nausea, vomiting  pain at site where injected  stomach pain This list may not describe all possible side effects. Call your doctor for medical advice about side effects. You may report side effects to FDA at 1-800-FDA-1088. Where should I keep my medicine? This drug is given in a hospital or clinic and will   not be stored at home. NOTE: This sheet is a summary. It may not cover all possible information. If you have questions about this medicine, talk to your doctor, pharmacist, or health care provider.  2020 Elsevier/Gold Standard (2018-03-01 11:34:41)  

## 2019-09-26 ENCOUNTER — Other Ambulatory Visit: Payer: Self-pay

## 2019-09-26 ENCOUNTER — Inpatient Hospital Stay: Payer: Medicare HMO

## 2019-09-26 VITALS — BP 126/65 | HR 75 | Temp 98.5°F | Resp 18

## 2019-09-26 DIAGNOSIS — Z170 Estrogen receptor positive status [ER+]: Secondary | ICD-10-CM | POA: Diagnosis not present

## 2019-09-26 DIAGNOSIS — C775 Secondary and unspecified malignant neoplasm of intrapelvic lymph nodes: Secondary | ICD-10-CM | POA: Diagnosis not present

## 2019-09-26 DIAGNOSIS — C50511 Malignant neoplasm of lower-outer quadrant of right female breast: Secondary | ICD-10-CM | POA: Diagnosis not present

## 2019-09-26 DIAGNOSIS — Z79811 Long term (current) use of aromatase inhibitors: Secondary | ICD-10-CM | POA: Diagnosis not present

## 2019-09-26 DIAGNOSIS — C7951 Secondary malignant neoplasm of bone: Secondary | ICD-10-CM | POA: Diagnosis not present

## 2019-09-26 MED ORDER — FULVESTRANT 250 MG/5ML IM SOLN
500.0000 mg | Freq: Once | INTRAMUSCULAR | Status: AC
  Administered 2019-09-26: 500 mg via INTRAMUSCULAR

## 2019-09-26 MED ORDER — FULVESTRANT 250 MG/5ML IM SOLN
INTRAMUSCULAR | Status: AC
  Filled 2019-09-26: qty 5

## 2019-09-26 NOTE — Patient Instructions (Signed)
Fulvestrant injection What is this medicine? FULVESTRANT (ful VES trant) blocks the effects of estrogen. It is used to treat breast cancer. This medicine may be used for other purposes; ask your health care provider or pharmacist if you have questions. COMMON BRAND NAME(S): FASLODEX What should I tell my health care provider before I take this medicine? They need to know if you have any of these conditions:  bleeding disorders  liver disease  low blood counts, like low white cell, platelet, or red cell counts  an unusual or allergic reaction to fulvestrant, other medicines, foods, dyes, or preservatives  pregnant or trying to get pregnant  breast-feeding How should I use this medicine? This medicine is for injection into a muscle. It is usually given by a health care professional in a hospital or clinic setting. Talk to your pediatrician regarding the use of this medicine in children. Special care may be needed. Overdosage: If you think you have taken too much of this medicine contact a poison control center or emergency room at once. NOTE: This medicine is only for you. Do not share this medicine with others. What if I miss a dose? It is important not to miss your dose. Call your doctor or health care professional if you are unable to keep an appointment. What may interact with this medicine?  medicines that treat or prevent blood clots like warfarin, enoxaparin, dalteparin, apixaban, dabigatran, and rivaroxaban This list may not describe all possible interactions. Give your health care provider a list of all the medicines, herbs, non-prescription drugs, or dietary supplements you use. Also tell them if you smoke, drink alcohol, or use illegal drugs. Some items may interact with your medicine. What should I watch for while using this medicine? Your condition will be monitored carefully while you are receiving this medicine. You will need important blood work done while you are taking  this medicine. Do not become pregnant while taking this medicine or for at least 1 year after stopping it. Women of child-bearing potential will need to have a negative pregnancy test before starting this medicine. Women should inform their doctor if they wish to become pregnant or think they might be pregnant. There is a potential for serious side effects to an unborn child. Men should inform their doctors if they wish to father a child. This medicine may lower sperm counts. Talk to your health care professional or pharmacist for more information. Do not breast-feed an infant while taking this medicine or for 1 year after the last dose. What side effects may I notice from receiving this medicine? Side effects that you should report to your doctor or health care professional as soon as possible:  allergic reactions like skin rash, itching or hives, swelling of the face, lips, or tongue  feeling faint or lightheaded, falls  pain, tingling, numbness, or weakness in the legs  signs and symptoms of infection like fever or chills; cough; flu-like symptoms; sore throat  vaginal bleeding Side effects that usually do not require medical attention (report to your doctor or health care professional if they continue or are bothersome):  aches, pains  constipation  diarrhea  headache  hot flashes  nausea, vomiting  pain at site where injected  stomach pain This list may not describe all possible side effects. Call your doctor for medical advice about side effects. You may report side effects to FDA at 1-800-FDA-1088. Where should I keep my medicine? This drug is given in a hospital or clinic and will   not be stored at home. NOTE: This sheet is a summary. It may not cover all possible information. If you have questions about this medicine, talk to your doctor, pharmacist, or health care provider.  2020 Elsevier/Gold Standard (2018-03-01 11:34:41)  

## 2019-10-02 MED FILL — IBRANCE 75 MG TABS: 75 | 28 days supply | Qty: 14 | Fill #3

## 2019-10-08 DIAGNOSIS — N130 Hydronephrosis with ureteropelvic junction obstruction: Secondary | ICD-10-CM | POA: Diagnosis not present

## 2019-10-09 ENCOUNTER — Other Ambulatory Visit: Payer: Self-pay

## 2019-10-09 DIAGNOSIS — C50511 Malignant neoplasm of lower-outer quadrant of right female breast: Secondary | ICD-10-CM

## 2019-10-09 NOTE — Progress Notes (Signed)
Patient Care Team: [name], MD as PCP - General (Internal Medicine) [name], MD as Consulting Physician (Hematology and Oncology)  DIAGNOSIS:    ICD-10-CM   1. Bone metastases (HCC)  C79.51   2. Malignant neoplasm of lower-outer quadrant of right breast of female, estrogen receptor positive ([place])  C50.511    Z17.0     SUMMARY OF ONCOLOGIC HISTORY: Oncology History  Breast cancer of lower-outer quadrant of right female breast ([place])  01/04/1995 Surgery   Right breast cancer status post lumpectomy followed by chemotherapy followed by radiation and tamoxifen 5 years (details are not available)   12/30/2015 Relapse/Recurrence   Chest wall/ soft tissue masses near the right side of the sternum, superior nodule measuring 14.5 mm, inferior nodule measuring 10.5 mm, 26 mm anterior mediastinal soft tissue mass, T9 bone metastases   12/30/2015 Procedure   Thoracentesis: 400 mL removed; showed reactive mesothelial cells, no cancer cells   12/30/2015 Imaging   CT chest: 26 x 17 mm anterior mediastinal soft tissue mass, mixed lytic and sclerotic bone mets mainly involving sternum and T9 vertebral body, scattered vertebral body involvement and rib involvement; chest wall soft tissue masses 14.5 mm, 10.5 mm   01/13/2016 Initial Biopsy   Right chest wall mass biopsy 2:30 position: Invasive ductal carcinoma focally involves skeletal muscle, grade 2, HER-2 negative ratio 1.08, ER/PR 95% positive   01/14/2016 PET scan   Widespread metastatic disease right breast, right chest wall, mediastinum, right internal mammary lymph nodes, right pleural space, malignant pleural effusion, right common iliac lymph node, bone metastases in the thorax   01/21/2016 -  Anti-estrogen oral therapy   Ibrance with letrozole   04/15/2017 - 04/23/2017 Hospital Admission   Pneumonia and confusion     CHIEF COMPLIANT: Follow-up of metastatic breast cancer on Ibrance with Faslodex  INTERVAL HISTORY: Jessica Zavala is  a 73 y.o. with above-mentioned history of metastatic breast cancer currently on treatment with Ibrance and Faslodex. She presents to the clinic today for follow-up and a toxicity check.  She has felt very good.  Has good appetite.  No pain.  She is tolerating the injections extremely well.  She has seen urology regarding the hydronephrosis on the soft tissue mass.  They are going to see her every 6 months.  Our plan is to monitor this with another CT scan in 3 months.  REVIEW OF SYSTEMS:   Constitutional: Denies fevers, chills or abnormal weight loss Eyes: Denies blurriness of vision Ears, nose, mouth, throat, and face: Denies mucositis or sore throat Respiratory: Denies cough, dyspnea or wheezes Cardiovascular: Denies palpitation, chest discomfort Gastrointestinal: Denies nausea, heartburn or change in bowel habits Skin: Denies abnormal skin rashes Lymphatics: Denies new lymphadenopathy or easy bruising Neurological: Denies numbness, tingling or new weaknesses Behavioral/Psych: Mood is stable, no new changes  Extremities: No lower extremity edema Breast: denies any pain or lumps or nodules in either breasts All other systems were reviewed with the patient and are negative.  I have reviewed the past medical history, past surgical history, social history and family history with the patient and they are unchanged from previous note.  ALLERGIES:  is allergic to aspirin.  MEDICATIONS:  Current Outpatient Medications  Medication Sig Dispense Refill  . atorvastatin (LIPITOR) 20 MG tablet Take 1 tablet (20 mg total) by mouth daily. 30 tablet 11  . betamethasone valerate ointment (VALISONE) 0.1 % Apply 1 application topically 2 (two) times daily. 30 g 0  . cephALEXin (KEFLEX) 500 MG  capsule Take 1 capsule (500 mg total) by mouth 2 (two) times daily. 14 capsule 0  . clobetasol cream (TEMOVATE) 9.47 % Apply 1 application topically 2 (two) times daily as needed. 30 g 0  . clotrimazole-betamethasone  (LOTRISONE) cream Apply 1 application topically 2 (two) times daily as needed (skin irritation). 45 g 0  . Flurandrenolide 4 MCG/SQCM TAPE Apply 1 each topically at bedtime as needed and may repeat dose one time if needed. 1 each 1  . furosemide (LASIX) 20 MG tablet Take 1 tablet (20 mg total) by mouth daily. 30 tablet 0  . labetalol (NORMODYNE) 200 MG tablet Take 1 tablet (200 mg total) by mouth 2 (two) times daily. 30 tablet 1  . letrozole (FEMARA) 2.5 MG tablet Take 1 tablet by mouth once daily 90 tablet 0  . palbociclib (IBRANCE) 75 MG tablet Take 1 tablet (75 mg total) by mouth daily. Take for 14 days on, 14 days off, repeat every 28 days. 14 tablet 6   No current facility-administered medications for this visit.     PHYSICAL EXAMINATION: ECOG PERFORMANCE STATUS: 1 - Symptomatic but completely ambulatory  Vitals:   10/10/19 0905  BP: 132/65  Pulse: 65  Resp: 17  Temp: 97.9 F (36.6 C)  SpO2: 99%   Filed Weights   10/10/19 0905  Weight: 120 lb 3.2 oz (54.5 kg)    GENERAL: alert, no distress and comfortable SKIN: skin color, texture, turgor are normal, no rashes or significant lesions EYES: normal, Conjunctiva are pink and non-injected, sclera clear OROPHARYNX: no exudate, no erythema and lips, buccal mucosa, and tongue normal  NECK: supple, thyroid normal size, non-tender, without nodularity LYMPH: no palpable lymphadenopathy in the cervical, axillary or inguinal LUNGS: clear to auscultation and percussion with normal breathing effort HEART: regular rate & rhythm and no murmurs and no lower extremity edema ABDOMEN: abdomen soft, non-tender and normal bowel sounds MUSCULOSKELETAL: no cyanosis of digits and no clubbing  NEURO: alert & oriented x 3 with fluent speech, no focal motor/sensory deficits EXTREMITIES: No lower extremity edema  LABORATORY DATA:  I have reviewed the data as listed CMP Latest Ref Rng & Units 08/20/2019 05/10/2019 02/08/2019  Glucose 70 - 99 mg/dL 96  100(H) 93  BUN 8 - 23 mg/dL 24(H) 21 15  Creatinine 0.44 - 1.00 mg/dL 1.13(H) 0.81 0.69  Sodium 135 - 145 mmol/L 139 139 139  Potassium 3.5 - 5.1 mmol/L 4.0 4.4 4.2  Chloride 98 - 111 mmol/L 102 102 101  CO2 22 - 32 mmol/L '30 29 31  ' Calcium 8.9 - 10.3 mg/dL 9.5 9.1 9.2  Total Protein 6.5 - 8.1 g/dL 8.0 7.7 8.2(H)  Total Bilirubin 0.3 - 1.2 mg/dL 0.6 0.5 0.4  Alkaline Phos 38 - 126 U/L 51 50 52  AST 15 - 41 U/L '25 25 25  ' ALT 0 - 44 U/L '9 15 14    ' Lab Results  Component Value Date   WBC 4.3 10/10/2019   HGB 11.3 (L) 10/10/2019   HCT 33.1 (L) 10/10/2019   MCV 99.7 10/10/2019   PLT 293 10/10/2019   NEUTROABS 2.0 10/10/2019    ASSESSMENT & PLAN:  Breast cancer of lower-outer quadrant of right female breast ([place]) Metastatic breast cancer PET-CT 01/14/16: Widespread metastatic disease right breast, right chest wall several nodules largest 3.7 cm, mediastinum, right internal mammary lymph node 2.2 cm, right pleural-based nodules 2.1cm and 2.7 cm, malignant pleural effusion, right common iliac lymph node, bone metastases in the thorax (sternum,  manubrium, multiple thoracic vertebral bodies, multiple right-sided ribs)  Right chest wall mass biopsy 2:30 position 01/13/2016: Invasive ductal carcinoma focally involves skeletal muscle, grade 2, HER-2 negative ratio 1.08, ER/PR pending (Right breast cancer 1996 status post lumpectomy followed by chemotherapy followed by radiation and tamoxifen 5 years (details are not available)) ---------------------------------------------------------------------------------------------------------------------------------------------------------- Treatment plan: Ibrance with letrozole started 01/20/2016 , cycle 3 Ibrance 100 mg, Cycle 4 Ibrance 75 mg dose; switch to 75 mg 3 weeks on 2 weeks offfrom 07/22/2016, changed to 2 weeks on 2 weeks off12/12/2015.  CT CAP 08/20/2019: Mild progression of metastatic disease.  Periureteric soft tissue metastases in the  right mid ureter mildly increased with stable right hydro-ureteral necrosis.  Widespread bone metastases increased in size and thoracic and lumbar vertebral lesions.  Stable right internal mammary soft tissue. We sent her to urology.  Current treatment: Ibrance with Faslodex started 08/29/2019 Toxicities: Tolerating the injections very well. Plan to do scans in 3 months.  Return to clinic in 1 month for labs and follow-up with injections.    No orders of the defined types were placed in this encounter.  The patient has a good understanding of the overall plan. she agrees with it. she will call with any problems that may develop before the next visit here.  [name], MD 10/10/2019  Julious Oka Dorshimer am acting as scribe for Dr. [name].  I have reviewed the above documentation for accuracy and completeness, and I agree with the above.

## 2019-10-10 ENCOUNTER — Inpatient Hospital Stay: Payer: Medicare HMO | Admitting: Hematology and Oncology

## 2019-10-10 ENCOUNTER — Other Ambulatory Visit: Payer: Self-pay

## 2019-10-10 ENCOUNTER — Inpatient Hospital Stay: Payer: Medicare HMO | Attending: Hematology and Oncology

## 2019-10-10 ENCOUNTER — Telehealth: Payer: Self-pay | Admitting: Hematology and Oncology

## 2019-10-10 ENCOUNTER — Inpatient Hospital Stay: Payer: Medicare HMO

## 2019-10-10 VITALS — BP 132/65 | HR 65 | Temp 97.9°F | Resp 17 | Ht <= 58 in | Wt 120.2 lb

## 2019-10-10 DIAGNOSIS — C7951 Secondary malignant neoplasm of bone: Secondary | ICD-10-CM

## 2019-10-10 DIAGNOSIS — C50511 Malignant neoplasm of lower-outer quadrant of right female breast: Secondary | ICD-10-CM | POA: Diagnosis not present

## 2019-10-10 DIAGNOSIS — Z79899 Other long term (current) drug therapy: Secondary | ICD-10-CM | POA: Diagnosis not present

## 2019-10-10 DIAGNOSIS — C775 Secondary and unspecified malignant neoplasm of intrapelvic lymph nodes: Secondary | ICD-10-CM | POA: Diagnosis not present

## 2019-10-10 DIAGNOSIS — Z79811 Long term (current) use of aromatase inhibitors: Secondary | ICD-10-CM | POA: Diagnosis not present

## 2019-10-10 DIAGNOSIS — C7981 Secondary malignant neoplasm of breast: Secondary | ICD-10-CM | POA: Diagnosis not present

## 2019-10-10 DIAGNOSIS — C7989 Secondary malignant neoplasm of other specified sites: Secondary | ICD-10-CM | POA: Insufficient documentation

## 2019-10-10 DIAGNOSIS — Z170 Estrogen receptor positive status [ER+]: Secondary | ICD-10-CM | POA: Diagnosis not present

## 2019-10-10 LAB — CMP (CANCER CENTER ONLY)
ALT: 14 U/L (ref 0–44)
AST: 23 U/L (ref 15–41)
Albumin: 3.8 g/dL (ref 3.5–5.0)
Alkaline Phosphatase: 52 U/L (ref 38–126)
Anion gap: 11 (ref 5–15)
BUN: 23 mg/dL (ref 8–23)
CO2: 30 mmol/L (ref 22–32)
Calcium: 9.4 mg/dL (ref 8.9–10.3)
Chloride: 97 mmol/L — ABNORMAL LOW (ref 98–111)
Creatinine: 1.12 mg/dL — ABNORMAL HIGH (ref 0.44–1.00)
GFR, Est AFR Am: 56 mL/min — ABNORMAL LOW (ref >60)
GFR, Estimated: 48 mL/min — ABNORMAL LOW (ref >60)
Glucose, Bld: 100 mg/dL — ABNORMAL HIGH (ref 70–99)
Potassium: 4.3 mmol/L (ref 3.5–5.1)
Sodium: 138 mmol/L (ref 135–145)
Total Bilirubin: 0.5 mg/dL (ref 0.3–1.2)
Total Protein: 7.7 g/dL (ref 6.5–8.1)

## 2019-10-10 LAB — CBC WITH DIFFERENTIAL (CANCER CENTER ONLY)
Abs Immature Granulocytes: 0.01 10*3/uL (ref 0.00–0.07)
Basophils Absolute: 0 10*3/uL (ref 0.0–0.1)
Basophils Relative: 1 %
Eosinophils Absolute: 0.6 10*3/uL — ABNORMAL HIGH (ref 0.0–0.5)
Eosinophils Relative: 14 %
HCT: 33.1 % — ABNORMAL LOW (ref 36.0–46.0)
Hemoglobin: 11.3 g/dL — ABNORMAL LOW (ref 12.0–15.0)
Immature Granulocytes: 0 %
Lymphocytes Relative: 31 %
Lymphs Abs: 1.3 10*3/uL (ref 0.7–4.0)
MCH: 34 pg (ref 26.0–34.0)
MCHC: 34.1 g/dL (ref 30.0–36.0)
MCV: 99.7 fL (ref 80.0–100.0)
Monocytes Absolute: 0.4 10*3/uL (ref 0.1–1.0)
Monocytes Relative: 9 %
Neutro Abs: 2 10*3/uL (ref 1.7–7.7)
Neutrophils Relative %: 45 %
Platelet Count: 293 10*3/uL (ref 150–400)
RBC: 3.32 MIL/uL — ABNORMAL LOW (ref 3.87–5.11)
RDW: 14.4 % (ref 11.5–15.5)
WBC Count: 4.3 10*3/uL (ref 4.0–10.5)
nRBC: 0 % (ref 0.0–0.2)

## 2019-10-10 MED ORDER — FULVESTRANT 250 MG/5ML IM SOLN
INTRAMUSCULAR | Status: AC
  Filled 2019-10-10: qty 5

## 2019-10-10 NOTE — Progress Notes (Signed)
Spoke with Geanie Kenning, RN and she informed me that the patient had her 3 loading doses of  the Faslodex and wasn't due again until on or around 11/22. She said the patient would not receive any injection today and she would make scheduling aware so they could make the appropriate appt.

## 2019-10-10 NOTE — Telephone Encounter (Signed)
Scheduled apt per 11/5 sch message - pt son aware of appt

## 2019-10-10 NOTE — Assessment & Plan Note (Signed)
Metastatic breast cancer PET-CT 01/14/16: Widespread metastatic disease right breast, right chest wall several nodules largest 3.7 cm, mediastinum, right internal mammary lymph node 2.2 cm, right pleural-based nodules 2.1cm and 2.7 cm, malignant pleural effusion, right common iliac lymph node, bone metastases in the thorax (sternum, manubrium, multiple thoracic vertebral bodies, multiple right-sided ribs)  Right chest wall mass biopsy 2:30 position 01/13/2016: Invasive ductal carcinoma focally involves skeletal muscle, grade 2, HER-2 negative ratio 1.08, ER/PR pending (Right breast cancer 1996 status post lumpectomy followed by chemotherapy followed by radiation and tamoxifen 5 years (details are not available)) ---------------------------------------------------------------------------------------------------------------------------------------------------------- Treatment plan: Ibrance with letrozole started 01/20/2016 , cycle 3 Ibrance 100 mg, Cycle 4 Ibrance 75 mg dose; switch to 75 mg 3 weeks on 2 weeks offfrom 07/22/2016, changed to 2 weeks on 2 weeks off12/12/2015.  CT CAP 08/20/2019: Mild progression of metastatic disease.  Periureteric soft tissue metastases in the right mid ureter mildly increased with stable right hydro-ureteral necrosis.  Widespread bone metastases increased in size and thoracic and lumbar vertebral lesions.  Stable right internal mammary soft tissue.  Current treatment: Ibrance with Faslodex started 08/29/2019 Toxicities: Tolerating the injections very well.  Return to clinic in 1 month for labs and follow-up with injections.

## 2019-10-14 DIAGNOSIS — M25561 Pain in right knee: Secondary | ICD-10-CM | POA: Diagnosis not present

## 2019-10-14 DIAGNOSIS — I129 Hypertensive chronic kidney disease with stage 1 through stage 4 chronic kidney disease, or unspecified chronic kidney disease: Secondary | ICD-10-CM | POA: Diagnosis not present

## 2019-10-14 DIAGNOSIS — N134 Hydroureter: Secondary | ICD-10-CM | POA: Diagnosis not present

## 2019-10-14 DIAGNOSIS — C50511 Malignant neoplasm of lower-outer quadrant of right female breast: Secondary | ICD-10-CM | POA: Diagnosis not present

## 2019-10-14 DIAGNOSIS — N183 Chronic kidney disease, stage 3 unspecified: Secondary | ICD-10-CM | POA: Diagnosis not present

## 2019-10-14 DIAGNOSIS — D6481 Anemia due to antineoplastic chemotherapy: Secondary | ICD-10-CM | POA: Diagnosis not present

## 2019-10-14 DIAGNOSIS — M1990 Unspecified osteoarthritis, unspecified site: Secondary | ICD-10-CM | POA: Diagnosis not present

## 2019-10-14 DIAGNOSIS — N1832 Chronic kidney disease, stage 3b: Secondary | ICD-10-CM | POA: Diagnosis not present

## 2019-10-14 DIAGNOSIS — R109 Unspecified abdominal pain: Secondary | ICD-10-CM | POA: Diagnosis not present

## 2019-10-14 DIAGNOSIS — C7951 Secondary malignant neoplasm of bone: Secondary | ICD-10-CM | POA: Diagnosis not present

## 2019-10-21 DIAGNOSIS — N958 Other specified menopausal and perimenopausal disorders: Secondary | ICD-10-CM | POA: Diagnosis not present

## 2019-10-21 DIAGNOSIS — M816 Localized osteoporosis [Lequesne]: Secondary | ICD-10-CM | POA: Diagnosis not present

## 2019-10-21 DIAGNOSIS — M8588 Other specified disorders of bone density and structure, other site: Secondary | ICD-10-CM | POA: Diagnosis not present

## 2019-10-25 ENCOUNTER — Other Ambulatory Visit: Payer: Self-pay

## 2019-10-25 ENCOUNTER — Inpatient Hospital Stay: Payer: Medicare HMO

## 2019-10-25 VITALS — BP 130/61 | HR 75 | Temp 97.8°F | Resp 16

## 2019-10-25 DIAGNOSIS — Z79899 Other long term (current) drug therapy: Secondary | ICD-10-CM | POA: Diagnosis not present

## 2019-10-25 DIAGNOSIS — C50511 Malignant neoplasm of lower-outer quadrant of right female breast: Secondary | ICD-10-CM | POA: Diagnosis not present

## 2019-10-25 DIAGNOSIS — C7989 Secondary malignant neoplasm of other specified sites: Secondary | ICD-10-CM | POA: Diagnosis not present

## 2019-10-25 DIAGNOSIS — C775 Secondary and unspecified malignant neoplasm of intrapelvic lymph nodes: Secondary | ICD-10-CM | POA: Diagnosis not present

## 2019-10-25 DIAGNOSIS — Z79811 Long term (current) use of aromatase inhibitors: Secondary | ICD-10-CM | POA: Diagnosis not present

## 2019-10-25 DIAGNOSIS — C7981 Secondary malignant neoplasm of breast: Secondary | ICD-10-CM | POA: Diagnosis not present

## 2019-10-25 DIAGNOSIS — C7951 Secondary malignant neoplasm of bone: Secondary | ICD-10-CM | POA: Diagnosis not present

## 2019-10-25 DIAGNOSIS — Z170 Estrogen receptor positive status [ER+]: Secondary | ICD-10-CM | POA: Diagnosis not present

## 2019-10-25 MED ORDER — FULVESTRANT 250 MG/5ML IM SOLN
500.0000 mg | Freq: Once | INTRAMUSCULAR | Status: AC
  Administered 2019-10-25: 500 mg via INTRAMUSCULAR

## 2019-10-25 MED ORDER — DENOSUMAB 120 MG/1.7ML ~~LOC~~ SOLN
SUBCUTANEOUS | Status: AC
  Filled 2019-10-25: qty 1.7

## 2019-10-25 MED ORDER — FULVESTRANT 250 MG/5ML IM SOLN
INTRAMUSCULAR | Status: AC
  Filled 2019-10-25: qty 5

## 2019-10-25 NOTE — Patient Instructions (Signed)
Fulvestrant injection What is this medicine? FULVESTRANT (ful VES trant) blocks the effects of estrogen. It is used to treat breast cancer. This medicine may be used for other purposes; ask your health care provider or pharmacist if you have questions. COMMON BRAND NAME(S): FASLODEX What should I tell my health care provider before I take this medicine? They need to know if you have any of these conditions:  bleeding disorders  liver disease  low blood counts, like low white cell, platelet, or red cell counts  an unusual or allergic reaction to fulvestrant, other medicines, foods, dyes, or preservatives  pregnant or trying to get pregnant  breast-feeding How should I use this medicine? This medicine is for injection into a muscle. It is usually given by a health care professional in a hospital or clinic setting. Talk to your pediatrician regarding the use of this medicine in children. Special care may be needed. Overdosage: If you think you have taken too much of this medicine contact a poison control center or emergency room at once. NOTE: This medicine is only for you. Do not share this medicine with others. What if I miss a dose? It is important not to miss your dose. Call your doctor or health care professional if you are unable to keep an appointment. What may interact with this medicine?  medicines that treat or prevent blood clots like warfarin, enoxaparin, dalteparin, apixaban, dabigatran, and rivaroxaban This list may not describe all possible interactions. Give your health care provider a list of all the medicines, herbs, non-prescription drugs, or dietary supplements you use. Also tell them if you smoke, drink alcohol, or use illegal drugs. Some items may interact with your medicine. What should I watch for while using this medicine? Your condition will be monitored carefully while you are receiving this medicine. You will need important blood work done while you are taking  this medicine. Do not become pregnant while taking this medicine or for at least 1 year after stopping it. Women of child-bearing potential will need to have a negative pregnancy test before starting this medicine. Women should inform their doctor if they wish to become pregnant or think they might be pregnant. There is a potential for serious side effects to an unborn child. Men should inform their doctors if they wish to father a child. This medicine may lower sperm counts. Talk to your health care professional or pharmacist for more information. Do not breast-feed an infant while taking this medicine or for 1 year after the last dose. What side effects may I notice from receiving this medicine? Side effects that you should report to your doctor or health care professional as soon as possible:  allergic reactions like skin rash, itching or hives, swelling of the face, lips, or tongue  feeling faint or lightheaded, falls  pain, tingling, numbness, or weakness in the legs  signs and symptoms of infection like fever or chills; cough; flu-like symptoms; sore throat  vaginal bleeding Side effects that usually do not require medical attention (report to your doctor or health care professional if they continue or are bothersome):  aches, pains  constipation  diarrhea  headache  hot flashes  nausea, vomiting  pain at site where injected  stomach pain This list may not describe all possible side effects. Call your doctor for medical advice about side effects. You may report side effects to FDA at 1-800-FDA-1088. Where should I keep my medicine? This drug is given in a hospital or clinic and will   not be stored at home. NOTE: This sheet is a summary. It may not cover all possible information. If you have questions about this medicine, talk to your doctor, pharmacist, or health care provider.  2020 Elsevier/Gold Standard (2018-03-01 11:34:41)  

## 2019-10-28 ENCOUNTER — Other Ambulatory Visit: Payer: Self-pay | Admitting: Hematology and Oncology

## 2019-10-28 DIAGNOSIS — C50511 Malignant neoplasm of lower-outer quadrant of right female breast: Secondary | ICD-10-CM

## 2019-10-29 MED FILL — IBRANCE 75 MG TABS: 75 | 28 days supply | Qty: 14 | Fill #4

## 2019-11-06 NOTE — Progress Notes (Signed)
Patient Care Team: [name], MD as PCP - General (Internal Medicine) [name], MD as Consulting Physician (Hematology and Oncology)  DIAGNOSIS:    ICD-10-CM   1. Malignant neoplasm of lower-outer quadrant of right breast of female, estrogen receptor positive ([place])  C50.511    Z17.0     SUMMARY OF ONCOLOGIC HISTORY: Oncology History  Breast cancer of lower-outer quadrant of right female breast ([place])  01/04/1995 Surgery   Right breast cancer status post lumpectomy followed by chemotherapy followed by radiation and tamoxifen 5 years (details are not available)   12/30/2015 Relapse/Recurrence   Chest wall/ soft tissue masses near the right side of the sternum, superior nodule measuring 14.5 mm, inferior nodule measuring 10.5 mm, 26 mm anterior mediastinal soft tissue mass, T9 bone metastases   12/30/2015 Procedure   Thoracentesis: 400 mL removed; showed reactive mesothelial cells, no cancer cells   12/30/2015 Imaging   CT chest: 26 x 17 mm anterior mediastinal soft tissue mass, mixed lytic and sclerotic bone mets mainly involving sternum and T9 vertebral body, scattered vertebral body involvement and rib involvement; chest wall soft tissue masses 14.5 mm, 10.5 mm   01/13/2016 Initial Biopsy   Right chest wall mass biopsy 2:30 position: Invasive ductal carcinoma focally involves skeletal muscle, grade 2, HER-2 negative ratio 1.08, ER/PR 95% positive   01/14/2016 PET scan   Widespread metastatic disease right breast, right chest wall, mediastinum, right internal mammary lymph nodes, right pleural space, malignant pleural effusion, right common iliac lymph node, bone metastases in the thorax   01/21/2016 -  Anti-estrogen oral therapy   Ibrance with letrozole   04/15/2017 - 04/23/2017 Hospital Admission   Pneumonia and confusion     CHIEF COMPLIANT: Follow-up of metastatic breast cancer on Ibrance with Faslodex  INTERVAL HISTORY: Jessica Zavala is a 73 y.o. with above-mentioned  history of metastatic breast cancer currently on treatment with Ibrance and Faslodex. She presents to the clinic today for follow-up and a toxicity check.  She complains of back pain issues but otherwise tolerating the treatment extremely well.  Denies any fevers or chills.  REVIEW OF SYSTEMS:   Constitutional: Denies fevers, chills or abnormal weight loss Eyes: Denies blurriness of vision Ears, nose, mouth, throat, and face: Denies mucositis or sore throat Respiratory: Denies cough, dyspnea or wheezes Cardiovascular: Denies palpitation, chest discomfort Gastrointestinal: Denies nausea, heartburn or change in bowel habits Skin: Denies abnormal skin rashes Lymphatics: Denies new lymphadenopathy or easy bruising Neurological: Denies numbness, tingling or new weaknesses Behavioral/Psych: Mood is stable, no new changes  Extremities: No lower extremity edema Breast: denies any pain or lumps or nodules in either breasts All other systems were reviewed with the patient and are negative.  I have reviewed the past medical history, past surgical history, social history and family history with the patient and they are unchanged from previous note.  ALLERGIES:  is allergic to aspirin.  MEDICATIONS:  Current Outpatient Medications  Medication Sig Dispense Refill  . atorvastatin (LIPITOR) 20 MG tablet Take 1 tablet (20 mg total) by mouth daily. 30 tablet 11  . clobetasol cream (TEMOVATE) 6.38 % Apply 1 application topically 2 (two) times daily as needed. 30 g 0  . clotrimazole-betamethasone (LOTRISONE) cream Apply 1 application topically 2 (two) times daily as needed (skin irritation). 45 g 0  . furosemide (LASIX) 20 MG tablet Take 1 tablet (20 mg total) by mouth daily. 30 tablet 0  . labetalol (NORMODYNE) 200 MG tablet Take 1 tablet (200 mg  total) by mouth 2 (two) times daily. 30 tablet 1  . letrozole (FEMARA) 2.5 MG tablet Take 1 tablet by mouth once daily 90 tablet 0  . palbociclib (IBRANCE) 75  MG tablet Take 1 tablet (75 mg total) by mouth daily. Take for 14 days on, 14 days off, repeat every 28 days. 14 tablet 6   No current facility-administered medications for this visit.     PHYSICAL EXAMINATION: ECOG PERFORMANCE STATUS: 1 - Symptomatic but completely ambulatory  Vitals:   11/07/19 0947  BP: 131/64  Pulse: 65  Resp: 18  Temp: 97.8 F (36.6 C)  SpO2: 98%   Filed Weights   11/07/19 0947  Weight: 122 lb 3.2 oz (55.4 kg)    GENERAL: alert, no distress and comfortable SKIN: skin color, texture, turgor are normal, no rashes or significant lesions EYES: normal, Conjunctiva are pink and non-injected, sclera clear OROPHARYNX: no exudate, no erythema and lips, buccal mucosa, and tongue normal  NECK: supple, thyroid normal size, non-tender, without nodularity LYMPH: no palpable lymphadenopathy in the cervical, axillary or inguinal LUNGS: clear to auscultation and percussion with normal breathing effort HEART: regular rate & rhythm and no murmurs and no lower extremity edema ABDOMEN: abdomen soft, non-tender and normal bowel sounds MUSCULOSKELETAL: no cyanosis of digits and no clubbing  NEURO: alert & oriented x 3 with fluent speech, no focal motor/sensory deficits EXTREMITIES: No lower extremity edema  LABORATORY DATA:  I have reviewed the data as listed CMP Latest Ref Rng & Units 10/10/2019 08/20/2019 05/10/2019  Glucose 70 - 99 mg/dL 100(H) 96 100(H)  BUN 8 - 23 mg/dL 23 24(H) 21  Creatinine 0.44 - 1.00 mg/dL 1.12(H) 1.13(H) 0.81  Sodium 135 - 145 mmol/L 138 139 139  Potassium 3.5 - 5.1 mmol/L 4.3 4.0 4.4  Chloride 98 - 111 mmol/L 97(L) 102 102  CO2 22 - 32 mmol/L _0 Calcium 8.9 - 10.3 mg/dL 9.4 9.5 9.1  Total Protein 6.5 - 8.1 g/dL 7.7 8.0 7.7  Total Bilirubin 0.3 - 1.2 mg/dL 0.5 0.6 0.5  Alkaline Phos 38 - 126 U/L 52 51 50  AST 15 - 41 U/L _1 ALT 0 - 44 U/L _2 Lab Results  Component Value Date   WBC 3.8 (L) 11/07/2019   HGB 10.6 (L)  11/07/2019   HCT 32.0 (L) 11/07/2019   MCV 102.6 (H) 11/07/2019   PLT 280 11/07/2019   NEUTROABS 2.0 11/07/2019    ASSESSMENT & PLAN:  Breast cancer of lower-outer quadrant of right female breast ([place]) Metastatic breast cancer PET-CT 01/14/16: Widespread metastatic disease right breast, right chest wall several nodules largest 3.7 cm, mediastinum, right internal mammary lymph node 2.2 cm, right pleural-based nodules 2.1cm and 2.7 cm, malignant pleural effusion, right common iliac lymph node, bone metastases in the thorax (sternum, manubrium, multiple thoracic vertebral bodies, multiple right-sided ribs)  Right chest wall mass biopsy 2:30 position 01/13/2016: Invasive ductal carcinoma focally involves skeletal muscle, grade 2, HER-2 negative ratio 1.08, ER/PR pending (Right breast cancer 1996 status post lumpectomy followed by chemotherapy followed by radiation and tamoxifen 5 years (details are not available)) ---------------------------------------------------------------------------------------------------------------------------------------------------------- Treatment plan: Ibrance with letrozole started 01/20/2016 , cycle 3 Ibrance 100 mg, Cycle 4 Ibrance 75 mg dose; switch to 75 mg 3 weeks on 2 weeks offfrom 07/22/2016, changed to 2 weeks on 2 weeks off12/12/2015.  CT CAP 08/20/2019: Mild progression of metastatic disease. Periureteric soft tissue metastases in the right mid ureter  mildly increased with stable right hydro-ureteral necrosis. Widespread bone metastases increased in size and thoracic and lumbar vertebral lesions. Stable right internal mammary soft tissue. We sent her to urology.  Current treatment: Ibrance with Faslodex started 08/29/2019 Toxicities: Tolerating the injections very well. Plan to do scans in 1 month and follow-up after that.  Return to clinic monthly for injections and in 3 months with labs and scans and follow-up. Procedure we can do platelets right  on we can do platelets on the day of her procedure.  It does not sound like it is going improve anytime soon No orders of the defined types were placed in this encounter.  The patient has a good understanding of the overall plan. she agrees with it. she will call with any problems that may develop before the next visit here.  [name], MD 11/07/2019  Julious Oka Dorshimer, am acting as scribe for Dr. [name].  I have reviewed the above documentation for accuracy and completeness, and I agree with the above.

## 2019-11-07 ENCOUNTER — Inpatient Hospital Stay: Payer: Medicare HMO

## 2019-11-07 ENCOUNTER — Inpatient Hospital Stay: Payer: Medicare HMO | Attending: Hematology and Oncology

## 2019-11-07 ENCOUNTER — Other Ambulatory Visit: Payer: Self-pay

## 2019-11-07 ENCOUNTER — Telehealth: Payer: Self-pay

## 2019-11-07 ENCOUNTER — Inpatient Hospital Stay (HOSPITAL_BASED_OUTPATIENT_CLINIC_OR_DEPARTMENT_OTHER): Payer: Medicare HMO | Admitting: Hematology and Oncology

## 2019-11-07 VITALS — BP 131/64 | HR 65 | Temp 97.8°F | Resp 18 | Ht <= 58 in | Wt 122.2 lb

## 2019-11-07 DIAGNOSIS — Z79899 Other long term (current) drug therapy: Secondary | ICD-10-CM | POA: Insufficient documentation

## 2019-11-07 DIAGNOSIS — C7951 Secondary malignant neoplasm of bone: Secondary | ICD-10-CM | POA: Diagnosis not present

## 2019-11-07 DIAGNOSIS — Z170 Estrogen receptor positive status [ER+]: Secondary | ICD-10-CM | POA: Diagnosis not present

## 2019-11-07 DIAGNOSIS — J910 Malignant pleural effusion: Secondary | ICD-10-CM | POA: Diagnosis not present

## 2019-11-07 DIAGNOSIS — C50511 Malignant neoplasm of lower-outer quadrant of right female breast: Secondary | ICD-10-CM

## 2019-11-07 DIAGNOSIS — Z79811 Long term (current) use of aromatase inhibitors: Secondary | ICD-10-CM | POA: Diagnosis not present

## 2019-11-07 LAB — CBC WITH DIFFERENTIAL (CANCER CENTER ONLY)
Abs Immature Granulocytes: 0.01 10*3/uL (ref 0.00–0.07)
Basophils Absolute: 0.1 10*3/uL (ref 0.0–0.1)
Basophils Relative: 1 %
Eosinophils Absolute: 0.2 10*3/uL (ref 0.0–0.5)
Eosinophils Relative: 5 %
HCT: 32 % — ABNORMAL LOW (ref 36.0–46.0)
Hemoglobin: 10.6 g/dL — ABNORMAL LOW (ref 12.0–15.0)
Immature Granulocytes: 0 %
Lymphocytes Relative: 28 %
Lymphs Abs: 1 10*3/uL (ref 0.7–4.0)
MCH: 34 pg (ref 26.0–34.0)
MCHC: 33.1 g/dL (ref 30.0–36.0)
MCV: 102.6 fL — ABNORMAL HIGH (ref 80.0–100.0)
Monocytes Absolute: 0.6 10*3/uL (ref 0.1–1.0)
Monocytes Relative: 15 %
Neutro Abs: 2 10*3/uL (ref 1.7–7.7)
Neutrophils Relative %: 51 %
Platelet Count: 280 10*3/uL (ref 150–400)
RBC: 3.12 MIL/uL — ABNORMAL LOW (ref 3.87–5.11)
RDW: 15.2 % (ref 11.5–15.5)
WBC Count: 3.8 10*3/uL — ABNORMAL LOW (ref 4.0–10.5)
nRBC: 0 % (ref 0.0–0.2)

## 2019-11-07 LAB — CMP (CANCER CENTER ONLY)
ALT: 14 U/L (ref 0–44)
AST: 25 U/L (ref 15–41)
Albumin: 3.6 g/dL (ref 3.5–5.0)
Alkaline Phosphatase: 51 U/L (ref 38–126)
Anion gap: 7 (ref 5–15)
BUN: 22 mg/dL (ref 8–23)
CO2: 30 mmol/L (ref 22–32)
Calcium: 8.6 mg/dL — ABNORMAL LOW (ref 8.9–10.3)
Chloride: 101 mmol/L (ref 98–111)
Creatinine: 0.99 mg/dL (ref 0.44–1.00)
GFR, Est AFR Am: >60 mL/min (ref >60)
GFR, Estimated: 56 mL/min — ABNORMAL LOW (ref >60)
Glucose, Bld: 104 mg/dL — ABNORMAL HIGH (ref 70–99)
Potassium: 4.8 mmol/L (ref 3.5–5.1)
Sodium: 138 mmol/L (ref 135–145)
Total Bilirubin: 0.4 mg/dL (ref 0.3–1.2)
Total Protein: 7.3 g/dL (ref 6.5–8.1)

## 2019-11-07 MED ORDER — DENOSUMAB 120 MG/1.7ML ~~LOC~~ SOLN
SUBCUTANEOUS | Status: AC
  Filled 2019-11-07: qty 1.7

## 2019-11-07 MED ORDER — DENOSUMAB 120 MG/1.7ML ~~LOC~~ SOLN
120.0000 mg | Freq: Once | SUBCUTANEOUS | Status: AC
  Administered 2019-11-07: 120 mg via SUBCUTANEOUS

## 2019-11-07 NOTE — Telephone Encounter (Signed)
Per Dr. Lindi Adie ok to give xgeva with calcium of 8.6

## 2019-11-07 NOTE — Assessment & Plan Note (Signed)
Metastatic breast cancer PET-CT 01/14/16: Widespread metastatic disease right breast, right chest wall several nodules largest 3.7 cm, mediastinum, right internal mammary lymph node 2.2 cm, right pleural-based nodules 2.1cm and 2.7 cm, malignant pleural effusion, right common iliac lymph node, bone metastases in the thorax (sternum, manubrium, multiple thoracic vertebral bodies, multiple right-sided ribs)  Right chest wall mass biopsy 2:30 position 01/13/2016: Invasive ductal carcinoma focally involves skeletal muscle, grade 2, HER-2 negative ratio 1.08, ER/PR pending (Right breast cancer 1996 status post lumpectomy followed by chemotherapy followed by radiation and tamoxifen 5 years (details are not available)) ---------------------------------------------------------------------------------------------------------------------------------------------------------- Treatment plan: Ibrance with letrozole started 01/20/2016 , cycle 3 Ibrance 100 mg, Cycle 4 Ibrance 75 mg dose; switch to 75 mg 3 weeks on 2 weeks offfrom 07/22/2016, changed to 2 weeks on 2 weeks off12/12/2015.  CT CAP 08/20/2019: Mild progression of metastatic disease. Periureteric soft tissue metastases in the right mid ureter mildly increased with stable right hydro-ureteral necrosis. Widespread bone metastases increased in size and thoracic and lumbar vertebral lesions. Stable right internal mammary soft tissue. We sent her to urology.  Current treatment: Ibrance with Faslodex started 08/29/2019 Toxicities: Tolerating the injections very well. Plan to do scans in 3 months.  Return to clinic monthly for injections and in 3 months with labs and scans and follow-up.

## 2019-11-07 NOTE — Patient Instructions (Signed)
Denosumab injection ?y l thu?c g? DENOSUMAB lm ch?m s? suy y?u x??ng. Prolia ???c dng ?? ?i?u tr? ch?ng long x??ng ? ph? n? sau khi mn kinh v ? nam gi?i, v ? nh?ng ng??i dng cc thu?c corticosteroid trong 6 thng ho?c lu h?n. Xgeva ???c dng ?? ?i?u tr? tnh tr?ng t?ng cao calci v ung th? v ?? phng ng?a b? gy x??ng v cc v?n ?? v? x??ng khc gy ra do b?nh ?a u t?y ho?c cc di c?n x??ng trong ung th?. Xgeva c?ng ???c dng ?? ?i?u tr? b??u t? bo kh?ng l? (giant cell tumor) c?a x??ng. Thu?c ny c th? ???c dng cho nh?ng m?c ?ch khc; hy h?i ng??i cung c?p d?ch v? y t? ho?c d??c s? c?a mnh, n?u qu v? c th?c m?c. (CC) NHN HI?U PH? BI?N: Prolia, XGEVA Ti c?n ph?i bo cho ng??i cung c?p d?ch v? y t? c?a mnh ?i?u g tr??c khi dng thu?c ny? H? c?n bi?t li?u qu v? c b?t k? tnh tr?ng no sau ?y khng:  b?nh v? r?ng  ???c gi?i ph?u ho?c nh? r?ng  nhi?m trng  b?nh th?n  m?c calci ho?c Vitamin D trong mu th?p  suy dinh d??ng  ?ang ???c th?m phn  cc tnh tr?ng b?nh l ho?c m?n c?m c?a da  b?nh tuy?n gip tr?ng ho?c tuy?n c?n gip tr?ng  ph?n ?ng b?t th??ng v?i denosumab  pha?n ??ng b?t th???ng ho??c di? ??ng v??i ca?c d??c ph?m kha?c  pha?n ??ng b?t th???ng ho??c di? ??ng v??i th??c ph?m, thu?c nhu?m, ho??c ch?t ba?o qua?n  ?ang c thai ho??c ??nh co? thai  ?ang cho con bu? Ti nn s? d?ng thu?c ny nh? th? no? Thu?c ny ?? tim d??i da. Thu?c ny ???c s? d?ng b?i chuyn vin y t? ? b?nh vi?n ho?c ? phng m?ch. Qu v? s? ???c nh?n m?t B?n H??ng D?n v? D??c Ph?m (MedGuide) tr??c m?i l?n ?i?u tr?. Hy b?o ??m ??c k? thng tin ny m?i l?n. ??i v?i Prolia, hy bn v?i bc s? nhi khoa c?a qu v? v? vi?c dng thu?c ny ? tr? em. C th? c?n ch?m sc ??c bi?t. ??i v?i Xgeva, hy bn v?i bc s? nhi khoa c?a qu v? v? vi?c dng thu?c ny ? tr? em. Thu?c ny c th? ???c k toa cho tr? em ch? m?i 13 tu?i trong nh?ng tr??ng h?p ch?n l?c, nh?ng c?n ph?i th?n  tr?ng. Qu li?u: N?u qu v? cho r?ng mnh ? dng qu nhi?u thu?c ny, th hy lin l?c v?i trung tm ki?m sot ch?t ??c ho?c phng c?p c?u ngay l?p t?c. L?U : Thu?c ny ch? dnh ring cho qu v?. Khng chia s? thu?c ny v?i nh?ng ng??i khc. N?u ti l? qun m?t li?u th sao? ?i?u quan tr?ng l khng nn b? l? li?u thu?c no. Hy lin l?c v?i bc s? ho?c chuyn vin y t? c?a mnh, n?u qu v? khng th? gi? ?ng cu?c h?n khm. Nh?ng g c th? t??ng tc v?i thu?c ny? Khng ???c dng thu?c ny cng v?i b?t k? th? no sau ?y:  nh??ng thu?c kha?c co? ch??a denosumab Thu?c ny c?ng c th? t??ng tc v?i cc thu?c sau ?y:  nh?ng thu?c lm gi?m c? h?i ch?ng l?i b?nh nhi?m trng  cc thu?c steroid, ch?ng h?n nh? prednisone ho?c cortisone Dan[facility] sch ny c th? khng m t? ?? h?t cc t??ng tc c th? x?y ra. Hy ??a cho ng??i cung   c?p d?ch v? y t? c?a mnh danh sch t?t c? cc thu?c, th?o d??c, cc thu?c khng c?n toa, ho?c cc ch? ph?m b? sung m qu v? dng. C?ng nn bo cho h? bi?t r?ng qu v? c ht thu?c, u?ng r??u, ho?c c s? d?ng ma ty tri php hay khng. Vi th? c th? t??ng tc v?i thu?c c?a qu v?. Ti c?n ph?i theo di ?i?u g trong khi dng thu?c ny? Hy ?i g?p bc s? ho?c chuyn vin y t? ?? theo di ??nh k? s? c?i thi?n c?a qu v?. Bc s? c th? yu c?u th? mu v lm cc xt nghi?m khc ?? theo di s? c?i thi?n c?a qu v?. Hy h?i  ki?n bc s? ho?c chuyn vin y t?, n?u qu v? b? s?t, ?n l?nh ho?c ?au h?ng, ho?c c cc tri?u ch?ng khc c?a c?m l?nh ho?c cm. Khng ???c t? ?i?u tr? cho mnh. Thu?c ny c th? lm gi?m kh? n?ng ch?ng l?i cc b?nh nhi?m trng c?a c? th?. Hy c? trnh ? g?n nh?ng ng??i b? b?nh. Qu v? c?n ph?i ch?c ch?n r?ng c? th? qu v? nh?n ???c ??y ?? l??ng calci v vitamin D trong th?i gian dng thu?c ny, tr? khi bc s? yu c?u qu v? ??ng lm nh? v?y. Hy th?o lu?n v?i bc s? ho?c chuyn vin y t? v? nh?ng th?c ph?m qu v? ?n v nh?ng vitamin qu v? dng. Hy ?i nha s?  th??ng xuyn. ?nh r?ng ho?c x?a r?ng b?ng ch? nha khoa nh? ? ???c ch? d?n. N?u qu v? c ?i lm r?ng, th hy bo v?i nha s? r?ng qu v? ?ang dng thu?c ny. Khng ???c c thai trong th?i gian dng thu?c ny ho?c trong vng 5 thng sau khi ng?ng dng thu?c. Hy bn v?i bc s? ho?c chuyn vin y t? v? cc bi?n php ng?a thai trong th?i gian dng thu?c ny. Ph? n? c?n ph?i thng bo cho bc s? c?a mnh, n?u mu?n c thai ho?c ngh? r?ng c th? mnh ? c thai. C nguy c? v? cc tc d?ng ph? nghim tr?ng ??i v?i thai nhi. Hy th?o lu?n v?i bc s? ho?c chuyn vin y t? ho?c d??c s? ?? bi?t thm thng tin. Ti c th? nh?n th?y nh?ng tc d?ng ph? no khi dng thu?c ny? Nh?ng tc d?ng ph? qu v? c?n ph?i bo cho bc s? ho?c chuyn vin y t? cng s?m cng t?t:  cc ph?n ?ng d? ?ng, ch?ng h?n nh? da b? m?n ??, ng?a, n?i my ?ay, s?ng ? m?t, mi, ho?c l??i  ?au x??ng  kh th?  chng m?t  ?au hm, ??c bi?t l sau khi lm r?ng  b? ?? da, r?p da, bong da  cc d?u hi?u v tri?u ch?ng nhi?m trng, ch?ng h?n nh? s?t ho?c ?n l?nh; ho; ?au h?ng; kh ?i ti?u ho?c ?i ti?u ?au  cc d?u hi?u b? th?p calci, nh? l c nh?p tim nhanh, b? chu?t rt hay ?au b?p th?t; b? ?au, nh?c, b? t ? tay hay chn; b? co gi?t  b? b?m tm ho?c xu?t huy?t b?t th??ng  m?t m?i ho?c y?u ?t b?t th??ng Cc tc d?ng ph? khng c?n ph?i ch?m sc y t? (hy bo cho bc s? ho?c chuyn vin y t?, n?u cc tc d?ng ph? ny ti?p di?n ho?c gy phi?n toi):  to bn  tiu ch?y  ?au ??u  ?au ho?c nh?c kh?p  m?t c?m gic ngon   mi?ng  ?au ho?c nh?c c? b?p  s? m?i  c?m th?y m?t m?i  kh ch?u ? bao t? Danh sch ny c th? khng m t? ?? h?t cc tc d?ng ph? c th? x?y ra. Xin g?i t?i bc s? c?a mnh ?? ???c c? v?n chuyn mn v? cc tc d?ng ph?. Qu v? c th? t??ng trnh cc tc d?ng ph? cho FDA theo s? [phone]. Ti nn c?t gi? thu?c c?a mnh ? ?u? Thu?c ny ch? ???c dng trong phng m?ch, v?n phng bc s? ho?c c? s? y t? khc  v s? khng ???c c?t gi? t?i nh. L?U : ?y l b?n tm t?t. N c th? khng bao hm t?t c? thng tin c th? c. N?u qu v? th?c m?c v? thu?c ny, xin trao ??i v?i bc s?, d??c s?, ho?c ng??i cung c?p d?ch v? y t? c?a mnh.  2020 Elsevier/Gold Standard (2018-05-07 00:00:00)  

## 2019-11-08 ENCOUNTER — Telehealth: Payer: Self-pay | Admitting: Hematology and Oncology

## 2019-11-08 NOTE — Telephone Encounter (Signed)
I could not reach patient will mail schedule 

## 2019-11-19 ENCOUNTER — Other Ambulatory Visit: Payer: Medicare HMO

## 2019-11-19 ENCOUNTER — Ambulatory Visit: Payer: Medicare HMO

## 2019-11-19 ENCOUNTER — Ambulatory Visit: Payer: Medicare HMO | Admitting: Hematology and Oncology

## 2019-12-02 ENCOUNTER — Inpatient Hospital Stay: Payer: Medicare HMO

## 2019-12-02 ENCOUNTER — Other Ambulatory Visit: Payer: Self-pay

## 2019-12-02 DIAGNOSIS — C50511 Malignant neoplasm of lower-outer quadrant of right female breast: Secondary | ICD-10-CM | POA: Diagnosis not present

## 2019-12-02 DIAGNOSIS — C7951 Secondary malignant neoplasm of bone: Secondary | ICD-10-CM | POA: Diagnosis not present

## 2019-12-02 DIAGNOSIS — J910 Malignant pleural effusion: Secondary | ICD-10-CM | POA: Diagnosis not present

## 2019-12-02 DIAGNOSIS — Z170 Estrogen receptor positive status [ER+]: Secondary | ICD-10-CM

## 2019-12-02 DIAGNOSIS — Z79899 Other long term (current) drug therapy: Secondary | ICD-10-CM | POA: Diagnosis not present

## 2019-12-02 DIAGNOSIS — Z79811 Long term (current) use of aromatase inhibitors: Secondary | ICD-10-CM | POA: Diagnosis not present

## 2019-12-02 LAB — CMP (CANCER CENTER ONLY)
ALT: 16 U/L (ref 0–44)
AST: 31 U/L (ref 15–41)
Albumin: 3.9 g/dL (ref 3.5–5.0)
Alkaline Phosphatase: 51 U/L (ref 38–126)
Anion gap: 11 (ref 5–15)
BUN: 27 mg/dL — ABNORMAL HIGH (ref 8–23)
CO2: 29 mmol/L (ref 22–32)
Calcium: 9.3 mg/dL (ref 8.9–10.3)
Chloride: 97 mmol/L — ABNORMAL LOW (ref 98–111)
Creatinine: 0.99 mg/dL (ref 0.44–1.00)
GFR, Est AFR Am: >60 mL/min (ref >60)
GFR, Estimated: 56 mL/min — ABNORMAL LOW (ref >60)
Glucose, Bld: 98 mg/dL (ref 70–99)
Potassium: 4.2 mmol/L (ref 3.5–5.1)
Sodium: 137 mmol/L (ref 135–145)
Total Bilirubin: 0.3 mg/dL (ref 0.3–1.2)
Total Protein: 7.8 g/dL (ref 6.5–8.1)

## 2019-12-02 LAB — CBC WITH DIFFERENTIAL (CANCER CENTER ONLY)
Abs Immature Granulocytes: 0 10*3/uL (ref 0.00–0.07)
Basophils Absolute: 0.1 10*3/uL (ref 0.0–0.1)
Basophils Relative: 2 %
Eosinophils Absolute: 0.1 10*3/uL (ref 0.0–0.5)
Eosinophils Relative: 4 %
HCT: 33.2 % — ABNORMAL LOW (ref 36.0–46.0)
Hemoglobin: 11.2 g/dL — ABNORMAL LOW (ref 12.0–15.0)
Immature Granulocytes: 0 %
Lymphocytes Relative: 38 %
Lymphs Abs: 1.2 10*3/uL (ref 0.7–4.0)
MCH: 33.9 pg (ref 26.0–34.0)
MCHC: 33.7 g/dL (ref 30.0–36.0)
MCV: 100.6 fL — ABNORMAL HIGH (ref 80.0–100.0)
Monocytes Absolute: 0.5 10*3/uL (ref 0.1–1.0)
Monocytes Relative: 16 %
Neutro Abs: 1.3 10*3/uL — ABNORMAL LOW (ref 1.7–7.7)
Neutrophils Relative %: 40 %
Platelet Count: 210 10*3/uL (ref 150–400)
RBC: 3.3 MIL/uL — ABNORMAL LOW (ref 3.87–5.11)
RDW: 15.4 % (ref 11.5–15.5)
WBC Count: 3.3 10*3/uL — ABNORMAL LOW (ref 4.0–10.5)
nRBC: 0 % (ref 0.0–0.2)

## 2019-12-02 NOTE — Progress Notes (Signed)
Patient Care Team: [name], MD as PCP - General (Internal Medicine) [name], MD as Consulting Physician (Hematology and Oncology)  DIAGNOSIS:    ICD-10-CM   1. Malignant neoplasm of lower-outer quadrant of right breast of female, estrogen receptor positive ([place])  C50.511    Z17.0     SUMMARY OF ONCOLOGIC HISTORY: Oncology History  Breast cancer of lower-outer quadrant of right female breast ([place])  01/04/1995 Surgery   Right breast cancer status post lumpectomy followed by chemotherapy followed by radiation and tamoxifen 5 years (details are not available)   12/30/2015 Relapse/Recurrence   Chest wall/ soft tissue masses near the right side of the sternum, superior nodule measuring 14.5 mm, inferior nodule measuring 10.5 mm, 26 mm anterior mediastinal soft tissue mass, T9 bone metastases   12/30/2015 Procedure   Thoracentesis: 400 mL removed; showed reactive mesothelial cells, no cancer cells   12/30/2015 Imaging   CT chest: 26 x 17 mm anterior mediastinal soft tissue mass, mixed lytic and sclerotic bone mets mainly involving sternum and T9 vertebral body, scattered vertebral body involvement and rib involvement; chest wall soft tissue masses 14.5 mm, 10.5 mm   01/13/2016 Initial Biopsy   Right chest wall mass biopsy 2:30 position: Invasive ductal carcinoma focally involves skeletal muscle, grade 2, HER-2 negative ratio 1.08, ER/PR 95% positive   01/14/2016 PET scan   Widespread metastatic disease right breast, right chest wall, mediastinum, right internal mammary lymph nodes, right pleural space, malignant pleural effusion, right common iliac lymph node, bone metastases in the thorax   01/21/2016 -  Anti-estrogen oral therapy   Ibrance with letrozole   04/15/2017 - 04/23/2017 Hospital Admission   Pneumonia and confusion     CHIEF COMPLIANT: Follow-up of metastatic breast cancer on Ibrance withFaslodex  INTERVAL HISTORY: Jessica Zavala is a 73 y.o. with above-mentioned  history of metastatic breast cancercurrently on treatment withIbrance and Faslodex.She presents to the clinic todayfor follow-up and a toxicity check.    REVIEW OF SYSTEMS:   Constitutional: Denies fevers, chills or abnormal weight loss Eyes: Denies blurriness of vision Ears, nose, mouth, throat, and face: Denies mucositis or sore throat Respiratory: Denies cough, dyspnea or wheezes Cardiovascular: Denies palpitation, chest discomfort Gastrointestinal: Denies nausea, heartburn or change in bowel habits Skin: Denies abnormal skin rashes Lymphatics: Denies new lymphadenopathy or easy bruising Neurological: Denies numbness, tingling or new weaknesses Behavioral/Psych: Mood is stable, no new changes  Extremities: No lower extremity edema Breast: denies any pain or lumps or nodules in either breasts All other systems were reviewed with the patient and are negative.  I have reviewed the past medical history, past surgical history, social history and family history with the patient and they are unchanged from previous note.  ALLERGIES:  is allergic to aspirin.  MEDICATIONS:  Current Outpatient Medications  Medication Sig Dispense Refill  . atorvastatin (LIPITOR) 20 MG tablet Take 1 tablet (20 mg total) by mouth daily. 30 tablet 11  . clobetasol cream (TEMOVATE) 2.16 % Apply 1 application topically 2 (two) times daily as needed. 30 g 0  . clotrimazole-betamethasone (LOTRISONE) cream Apply 1 application topically 2 (two) times daily as needed (skin irritation). 45 g 0  . furosemide (LASIX) 20 MG tablet Take 1 tablet (20 mg total) by mouth daily. 30 tablet 0  . labetalol (NORMODYNE) 200 MG tablet Take 1 tablet (200 mg total) by mouth 2 (two) times daily. 30 tablet 1  . letrozole (FEMARA) 2.5 MG tablet Take 1 tablet by mouth once  daily 90 tablet 0  . palbociclib (IBRANCE) 75 MG tablet Take 1 tablet (75 mg total) by mouth daily. Take for 14 days on, 14 days off, repeat every 28 days. 14 tablet  6   No current facility-administered medications for this visit.    PHYSICAL EXAMINATION: ECOG PERFORMANCE STATUS: 1 - Symptomatic but completely ambulatory she is a she is here she is an 58 and then I will order bone scans and CT scan I just sent another said she will have it done in January before her visit to see me and 26 for scans and follow-up  Vitals:   12/03/19 0939  BP: 131/65  Pulse: 70  Resp: 19  Temp: 98.2 F (36.8 C)  SpO2: 99%   Filed Weights   12/03/19 0939  Weight: 120 lb 4.8 oz (54.6 kg)    GENERAL: alert, no distress and comfortable SKIN: skin color, texture, turgor are normal, no rashes or significant lesions EYES: normal, Conjunctiva are pink and non-injected, sclera clear OROPHARYNX: no exudate, no erythema and lips, buccal mucosa, and tongue normal  NECK: supple, thyroid normal size, non-tender, without nodularity LYMPH: no palpable lymphadenopathy in the cervical, axillary or inguinal LUNGS: clear to auscultation and percussion with normal breathing effort HEART: regular rate & rhythm and no murmurs and no lower extremity edema ABDOMEN: abdomen soft, non-tender and normal bowel sounds MUSCULOSKELETAL: no cyanosis of digits and no clubbing  NEURO: alert & oriented x 3 with fluent speech, no focal motor/sensory deficits EXTREMITIES: No lower extremity edema  LABORATORY DATA:  I have reviewed the data as listed CMP Latest Ref Rng & Units 12/02/2019 11/07/2019 10/10/2019  Glucose 70 - 99 mg/dL 98 104(H) 100(H)  BUN 8 - 23 mg/dL 27(H) 22 23  Creatinine 0.44 - 1.00 mg/dL 0.99 0.99 1.12(H)  Sodium 135 - 145 mmol/L 137 138 138  Potassium 3.5 - 5.1 mmol/L 4.2 4.8 4.3  Chloride 98 - 111 mmol/L 97(L) 101 97(L)  CO2 22 - 32 mmol/L '29 30 30  ' Calcium 8.9 - 10.3 mg/dL 9.3 8.6(L) 9.4  Total Protein 6.5 - 8.1 g/dL 7.8 7.3 7.7  Total Bilirubin 0.3 - 1.2 mg/dL 0.3 0.4 0.5  Alkaline Phos 38 - 126 U/L 51 51 52  AST 15 - 41 U/L '31 25 23  ' ALT 0 - 44 U/L '16 14 14     ' Lab Results  Component Value Date   WBC 3.3 (L) 12/02/2019   HGB 11.2 (L) 12/02/2019   HCT 33.2 (L) 12/02/2019   MCV 100.6 (H) 12/02/2019   PLT 210 12/02/2019   NEUTROABS 1.3 (L) 12/02/2019    ASSESSMENT & PLAN:  Breast cancer of lower-outer quadrant of right female breast ([place]) Metastatic breast cancer PET-CT 01/14/16: Widespread metastatic disease right breast, right chest wall several nodules largest 3.7 cm, mediastinum, right internal mammary lymph node 2.2 cm, right pleural-based nodules 2.1cm and 2.7 cm, malignant pleural effusion, right common iliac lymph node, bone metastases in the thorax (sternum, manubrium, multiple thoracic vertebral bodies, multiple right-sided ribs)  Right chest wall mass biopsy 2:30 position 01/13/2016: Invasive ductal carcinoma focally involves skeletal muscle, grade 2, HER-2 negative ratio 1.08, ER/PR pending (Right breast cancer 1996 status post lumpectomy followed by chemotherapy followed by radiation and tamoxifen 5 years (details are not available)) ---------------------------------------------------------------------------------------------------------------------------------------------------------- Treatment plan: Ibrance with letrozole started 01/20/2016 , cycle 3 Ibrance 100 mg, Cycle 4 Ibrance 75 mg dose; switch to 75 mg 3 weeks on 2 weeks offfrom 07/22/2016, changed to 2 weeks on  2 weeks off12/12/2015.  CT CAP 08/20/2019: Mild progression of metastatic disease. Periureteric soft tissue metastases in the right mid ureter mildly increased with stable right hydro-ureteral necrosis. Widespread bone metastases increased in size and thoracic and lumbar vertebral lesions. Stable right internal mammary soft tissue. We sent her to urology.  Current treatment: Ibrance with Faslodex started 08/29/2019 Toxicities: Tolerating the injections very well. Left flank and back pain as well as right knee pain: We need to do the scans and assess the cause  of these symptoms. Plan to do scans in 1  week and follow-up after that    No orders of the defined types were placed in this encounter.  The patient has a good understanding of the overall plan. she agrees with it. she will call with any problems that may develop before the next visit here.  [name], MD 12/03/2019  Julious Oka Dorshimer, am acting as scribe for Dr. [name].  I have reviewed the above document for accuracy and completeness, and I agree with the above.

## 2019-12-03 ENCOUNTER — Telehealth: Payer: Self-pay

## 2019-12-03 ENCOUNTER — Inpatient Hospital Stay (HOSPITAL_BASED_OUTPATIENT_CLINIC_OR_DEPARTMENT_OTHER): Payer: Medicare HMO | Admitting: Hematology and Oncology

## 2019-12-03 ENCOUNTER — Inpatient Hospital Stay: Payer: Medicare HMO

## 2019-12-03 ENCOUNTER — Other Ambulatory Visit: Payer: Self-pay

## 2019-12-03 DIAGNOSIS — Z79899 Other long term (current) drug therapy: Secondary | ICD-10-CM | POA: Diagnosis not present

## 2019-12-03 DIAGNOSIS — J910 Malignant pleural effusion: Secondary | ICD-10-CM | POA: Diagnosis not present

## 2019-12-03 DIAGNOSIS — C7951 Secondary malignant neoplasm of bone: Secondary | ICD-10-CM | POA: Diagnosis not present

## 2019-12-03 DIAGNOSIS — Z79811 Long term (current) use of aromatase inhibitors: Secondary | ICD-10-CM | POA: Diagnosis not present

## 2019-12-03 DIAGNOSIS — C50511 Malignant neoplasm of lower-outer quadrant of right female breast: Secondary | ICD-10-CM

## 2019-12-03 DIAGNOSIS — Z170 Estrogen receptor positive status [ER+]: Secondary | ICD-10-CM | POA: Diagnosis not present

## 2019-12-03 MED ORDER — FULVESTRANT 250 MG/5ML IM SOLN
INTRAMUSCULAR | Status: AC
  Filled 2019-12-03: qty 5

## 2019-12-03 MED ORDER — FULVESTRANT 250 MG/5ML IM SOLN
500.0000 mg | Freq: Once | INTRAMUSCULAR | Status: AC
  Administered 2019-12-03: 500 mg via INTRAMUSCULAR

## 2019-12-03 NOTE — Telephone Encounter (Signed)
RN spoke with patient's son, RN gave specific time/date of appointments along with instructions for NPO, and oral contrast.  Pt's son will have arrangements for oral contrast to be picked up.

## 2019-12-03 NOTE — Assessment & Plan Note (Signed)
Metastatic breast cancer PET-CT 01/14/16: Widespread metastatic disease right breast, right chest wall several nodules largest 3.7 cm, mediastinum, right internal mammary lymph node 2.2 cm, right pleural-based nodules 2.1cm and 2.7 cm, malignant pleural effusion, right common iliac lymph node, bone metastases in the thorax (sternum, manubrium, multiple thoracic vertebral bodies, multiple right-sided ribs)  Right chest wall mass biopsy 2:30 position 01/13/2016: Invasive ductal carcinoma focally involves skeletal muscle, grade 2, HER-2 negative ratio 1.08, ER/PR pending (Right breast cancer 1996 status post lumpectomy followed by chemotherapy followed by radiation and tamoxifen 5 years (details are not available)) ---------------------------------------------------------------------------------------------------------------------------------------------------------- Treatment plan: Ibrance with letrozole started 01/20/2016 , cycle 3 Ibrance 100 mg, Cycle 4 Ibrance 75 mg dose; switch to 75 mg 3 weeks on 2 weeks offfrom 07/22/2016, changed to 2 weeks on 2 weeks off12/12/2015.  CT CAP 08/20/2019: Mild progression of metastatic disease. Periureteric soft tissue metastases in the right mid ureter mildly increased with stable right hydro-ureteral necrosis. Widespread bone metastases increased in size and thoracic and lumbar vertebral lesions. Stable right internal mammary soft tissue. We sent her to urology.  Current treatment: Ibrance with Faslodex started 08/29/2019 Toxicities: Tolerating the injections very well. Plan to do scans in 1 month and follow-up after that.

## 2019-12-03 NOTE — Telephone Encounter (Signed)
RN left voicemail with patient and patient's soon to [facility] regarding scheduling of upcoming scans.

## 2019-12-03 NOTE — Patient Instructions (Addendum)
Fulvestrant injection What is this medicine? FULVESTRANT (ful VES trant) blocks the effects of estrogen. It is used to treat breast cancer. This medicine may be used for other purposes; ask your health care provider or pharmacist if you have questions. COMMON BRAND NAME(S): FASLODEX What should I tell my health care provider before I take this medicine? They need to know if you have any of these conditions:  bleeding disorders  liver disease  low blood counts, like low white cell, platelet, or red cell counts  an unusual or allergic reaction to fulvestrant, other medicines, foods, dyes, or preservatives  pregnant or trying to get pregnant  breast-feeding How should I use this medicine? This medicine is for injection into a muscle. It is usually given by a health care professional in a hospital or clinic setting. Talk to your pediatrician regarding the use of this medicine in children. Special care may be needed. Overdosage: If you think you have taken too much of this medicine contact a poison control center or emergency room at once. NOTE: This medicine is only for you. Do not share this medicine with others. What if I miss a dose? It is important not to miss your dose. Call your doctor or health care professional if you are unable to keep an appointment. What may interact with this medicine?  medicines that treat or prevent blood clots like warfarin, enoxaparin, dalteparin, apixaban, dabigatran, and rivaroxaban This list may not describe all possible interactions. Give your health care provider a list of all the medicines, herbs, non-prescription drugs, or dietary supplements you use. Also tell them if you smoke, drink alcohol, or use illegal drugs. Some items may interact with your medicine. What should I watch for while using this medicine? Your condition will be monitored carefully while you are receiving this medicine. You will need important blood work done while you are taking  this medicine. Do not become pregnant while taking this medicine or for at least 1 year after stopping it. Women of child-bearing potential will need to have a negative pregnancy test before starting this medicine. Women should inform their doctor if they wish to become pregnant or think they might be pregnant. There is a potential for serious side effects to an unborn child. Men should inform their doctors if they wish to father a child. This medicine may lower sperm counts. Talk to your health care professional or pharmacist for more information. Do not breast-feed an infant while taking this medicine or for 1 year after the last dose. What side effects may I notice from receiving this medicine? Side effects that you should report to your doctor or health care professional as soon as possible:  allergic reactions like skin rash, itching or hives, swelling of the face, lips, or tongue  feeling faint or lightheaded, falls  pain, tingling, numbness, or weakness in the legs  signs and symptoms of infection like fever or chills; cough; flu-like symptoms; sore throat  vaginal bleeding Side effects that usually do not require medical attention (report to your doctor or health care professional if they continue or are bothersome):  aches, pains  constipation  diarrhea  headache  hot flashes  nausea, vomiting  pain at site where injected  stomach pain This list may not describe all possible side effects. Call your doctor for medical advice about side effects. You may report side effects to FDA at 1-800-FDA-1088. Where should I keep my medicine? This drug is given in a hospital or clinic and will   not be stored at home. NOTE: This sheet is a summary. It may not cover all possible information. If you have questions about this medicine, talk to your doctor, pharmacist, or health care provider.  2020 Elsevier/Gold Standard (2018-03-01 11:34:41)  

## 2019-12-05 ENCOUNTER — Ambulatory Visit: Payer: Medicare HMO

## 2019-12-12 ENCOUNTER — Telehealth: Payer: Self-pay

## 2019-12-12 MED FILL — IBRANCE 75 MG TABS: 75 | 28 days supply | Qty: 14 | Fill #5

## 2019-12-12 NOTE — Telephone Encounter (Signed)
Oral Oncology Patient Advocate Encounter   Was successful in securing patient a $16,000 grant from [employer] ([employer]) to provide copayment coverage for Ibrance.  This will keep the out of pocket expense at $0.     I have spoken with the patient.    The billing information is as follows and has been shared with [place].   RxBin: [id number] PCN:  [id number] Member ID: [id number]  Group ID: [id number]  Dates of Eligibility: 12/12/19 through 12/11/20  [place] [facility] [place] Phone [phone] Fax [phone] 12/12/2019 9:11 AM

## 2019-12-16 ENCOUNTER — Other Ambulatory Visit: Payer: Self-pay

## 2019-12-16 ENCOUNTER — Ambulatory Visit (HOSPITAL_COMMUNITY)
Admission: RE | Admit: 2019-12-16 | Discharge: 2019-12-16 | Disposition: A | Discharge status: Home / Self Care | Payer: Medicare HMO | Source: Ambulatory Visit | Attending: Hematology and Oncology | Admitting: Hematology and Oncology

## 2019-12-16 ENCOUNTER — Encounter (HOSPITAL_COMMUNITY): Payer: Self-pay

## 2019-12-16 ENCOUNTER — Encounter (HOSPITAL_COMMUNITY)
Admission: RE | Admit: 2019-12-16 | Discharge: 2019-12-16 | Disposition: A | Discharge status: Home / Self Care | Payer: Medicare HMO | Source: Ambulatory Visit | Attending: Hematology and Oncology | Admitting: Hematology and Oncology

## 2019-12-16 DIAGNOSIS — C7951 Secondary malignant neoplasm of bone: Secondary | ICD-10-CM | POA: Diagnosis not present

## 2019-12-16 DIAGNOSIS — C50511 Malignant neoplasm of lower-outer quadrant of right female breast: Secondary | ICD-10-CM | POA: Diagnosis not present

## 2019-12-16 DIAGNOSIS — Z170 Estrogen receptor positive status [ER+]: Secondary | ICD-10-CM

## 2019-12-16 DIAGNOSIS — C50919 Malignant neoplasm of unspecified site of unspecified female breast: Secondary | ICD-10-CM | POA: Diagnosis not present

## 2019-12-16 MED ORDER — TECHNETIUM TC 99M MEDRONATE IV KIT
20.9000 | PACK | Freq: Once | INTRAVENOUS | Status: AC
  Administered 2019-12-16: 12:00:00 20.9 via INTRAVENOUS

## 2019-12-16 MED ORDER — SODIUM CHLORIDE (PF) 0.9 % IJ SOLN
INTRAMUSCULAR | Status: AC
  Filled 2019-12-16: qty 50

## 2019-12-16 MED ORDER — IOHEXOL 300 MG/ML  SOLN
100.0000 mL | Freq: Once | INTRAMUSCULAR | Status: AC | PRN
  Administered 2019-12-16: 100 mL via INTRAVENOUS

## 2019-12-22 NOTE — Progress Notes (Signed)
Patient Care Team: [name], MD as PCP - General (Internal Medicine) [name], MD as Consulting Physician (Hematology and Oncology)  DIAGNOSIS:    ICD-10-CM   1. Bone metastases (HCC)  C79.51   2. Metastatic breast cancer ([place])  C50.919   3. Malignant neoplasm of lower-outer quadrant of right breast of female, estrogen receptor positive ([place])  C50.511    Z17.0     SUMMARY OF ONCOLOGIC HISTORY: Oncology History  Breast cancer of lower-outer quadrant of right female breast ([place])  01/04/1995 Surgery   Right breast cancer status post lumpectomy followed by chemotherapy followed by radiation and tamoxifen 5 years (details are not available)   12/30/2015 Relapse/Recurrence   Chest wall/ soft tissue masses near the right side of the sternum, superior nodule measuring 14.5 mm, inferior nodule measuring 10.5 mm, 26 mm anterior mediastinal soft tissue mass, T9 bone metastases   12/30/2015 Procedure   Thoracentesis: 400 mL removed; showed reactive mesothelial cells, no cancer cells   12/30/2015 Imaging   CT chest: 26 x 17 mm anterior mediastinal soft tissue mass, mixed lytic and sclerotic bone mets mainly involving sternum and T9 vertebral body, scattered vertebral body involvement and rib involvement; chest wall soft tissue masses 14.5 mm, 10.5 mm   01/13/2016 Initial Biopsy   Right chest wall mass biopsy 2:30 position: Invasive ductal carcinoma focally involves skeletal muscle, grade 2, HER-2 negative ratio 1.08, ER/PR 95% positive   01/14/2016 PET scan   Widespread metastatic disease right breast, right chest wall, mediastinum, right internal mammary lymph nodes, right pleural space, malignant pleural effusion, right common iliac lymph node, bone metastases in the thorax   01/21/2016 -  Anti-estrogen oral therapy   Ibrance with letrozole   04/15/2017 - 04/23/2017 Hospital Admission   Pneumonia and confusion     CHIEF COMPLIANT: Follow-up of metastatic breast cancer on Ibrance  withFaslodex to review scans  INTERVAL HISTORY: Jessica Zavala is a 74 y.o. with above-mentioned history of metastatic breast cancercurrently on treatment withIbrance and Faslodex. CT CAP on 12/16/19 showed mildly worsened osseous metastatic disease at T11-L1, enlargement of the soft tissue mass adjacent to the mid right ureter, a 4 mm left upper lobe pulmonary nodule, and a 3 mm right upper lobe peribronchial nodule. Bone scan on 12/16/19 showed osseous metastatic disease in the thoracic spine, L1, and sternum. She presents to the clinic today to review her scans.   ALLERGIES:  is allergic to aspirin.  MEDICATIONS:  Current Outpatient Medications  Medication Sig Dispense Refill  . atorvastatin (LIPITOR) 20 MG tablet Take 1 tablet (20 mg total) by mouth daily. 30 tablet 11  . clobetasol cream (TEMOVATE) 7.03 % Apply 1 application topically 2 (two) times daily as needed. 30 g 0  . clotrimazole-betamethasone (LOTRISONE) cream Apply 1 application topically 2 (two) times daily as needed (skin irritation). 45 g 0  . furosemide (LASIX) 20 MG tablet Take 1 tablet (20 mg total) by mouth daily. 30 tablet 0  . labetalol (NORMODYNE) 200 MG tablet Take 1 tablet (200 mg total) by mouth 2 (two) times daily. 30 tablet 1  . letrozole (FEMARA) 2.5 MG tablet Take 1 tablet by mouth once daily 90 tablet 0  . palbociclib (IBRANCE) 75 MG tablet Take 1 tablet (75 mg total) by mouth daily. Take for 14 days on, 14 days off, repeat every 28 days. 14 tablet 6   No current facility-administered medications for this visit.    PHYSICAL EXAMINATION: ECOG PERFORMANCE STATUS: 1 - Symptomatic  but completely ambulatory  Vitals:   12/23/19 0816  BP: 109/63  Pulse: 69  Resp: 18  Temp: 98.2 F (36.8 C)  SpO2: 98%   Filed Weights   12/23/19 0816  Weight: 116 lb 12.8 oz (53 kg)    LABORATORY DATA:  I have reviewed the data as listed CMP Latest Ref Rng & Units 12/02/2019 11/07/2019 10/10/2019  Glucose 70 - 99 mg/dL 98  104(H) 100(H)  BUN 8 - 23 mg/dL 27(H) 22 23  Creatinine 0.44 - 1.00 mg/dL 0.99 0.99 1.12(H)  Sodium 135 - 145 mmol/L 137 138 138  Potassium 3.5 - 5.1 mmol/L 4.2 4.8 4.3  Chloride 98 - 111 mmol/L 97(L) 101 97(L)  CO2 22 - 32 mmol/L '29 30 30  ' Calcium 8.9 - 10.3 mg/dL 9.3 8.6(L) 9.4  Total Protein 6.5 - 8.1 g/dL 7.8 7.3 7.7  Total Bilirubin 0.3 - 1.2 mg/dL 0.3 0.4 0.5  Alkaline Phos 38 - 126 U/L 51 51 52  AST 15 - 41 U/L '31 25 23  ' ALT 0 - 44 U/L '16 14 14    ' Lab Results  Component Value Date   WBC 3.3 (L) 12/02/2019   HGB 11.2 (L) 12/02/2019   HCT 33.2 (L) 12/02/2019   MCV 100.6 (H) 12/02/2019   PLT 210 12/02/2019   NEUTROABS 1.3 (L) 12/02/2019    ASSESSMENT & PLAN:  Breast cancer of lower-outer quadrant of right female breast ([place]) Metastatic breast cancer PET-CT 01/14/16: Widespread metastatic disease right breast, right chest wall several nodules largest 3.7 cm, mediastinum, right internal mammary lymph node 2.2 cm, right pleural-based nodules 2.1cm and 2.7 cm, malignant pleural effusion, right common iliac lymph node, bone metastases in the thorax (sternum, manubrium, multiple thoracic vertebral bodies, multiple right-sided ribs)  Right chest wall mass biopsy 2:30 position 01/13/2016: Invasive ductal carcinoma focally involves skeletal muscle, grade 2, HER-2 negative ratio 1.08, ER/PR pending (Right breast cancer 1996 status post lumpectomy followed by chemotherapy followed by radiation and tamoxifen 5 years (details are not available)) ---------------------------------------------------------------------------------------------------------------------------------------------------------- Treatment plan: Ibrance with letrozole started 01/20/2016 , cycle 3 Ibrance 100 mg, Cycle 4 Ibrance 75 mg dose; switch to 75 mg 3 weeks on 2 weeks offfrom 07/22/2016, changed to 2 weeks on 2 weeks off12/12/2015.  Faslodex added 08/29/2019  CT CAP 12/16/2019: Mildly worsened bone metastatic  disease enlargement of sclerotic metastatic lesion T11-L1, enlargement of soft tissue mass adjacent to mid right ureter 2.8 cm (previously was 2.5 cm) causing right hydronephrosis.  4 mm left upper lobe lung nodule previously was 2 mm and 3 mm right upper lobe nodule. Bone scan 12/16/2019: Osseous metastatic disease throughout the thoracic spine, L1 and sternum  Radiology review: I discussed with the patient that there is continued slow progression of metastatic disease in spite of adding Faslodex. Recommendation: Radiation oncology consult regarding the soft tissue mass adjacent to the ureter. I sent a message to Dr. Dorathy Daft as well as Dr. Tresa Moore with urology regarding the scans to see if they would recommend anything further to be done. Continue current treatment plan with monthly Faslodex injections.  Follow-up in 3 months with labs    No orders of the defined types were placed in this encounter.  The patient has a good understanding of the overall plan. she agrees with it. she will call with any problems that may develop before the next visit here.  Total time spent: 30 mins including face to face time and time spent for planning, charting and coordination of care  [name], MD 12/23/2019  Julious Oka Dorshimer, am acting as scribe for Dr. [name].  I have reviewed the above documentation for accuracy and completeness, and I agree with the above.

## 2019-12-23 ENCOUNTER — Other Ambulatory Visit: Payer: Self-pay

## 2019-12-23 ENCOUNTER — Inpatient Hospital Stay: Payer: Medicare HMO | Attending: Hematology and Oncology | Admitting: Hematology and Oncology

## 2019-12-23 VITALS — BP 109/63 | HR 69 | Temp 98.2°F | Resp 18 | Ht <= 58 in | Wt 116.8 lb

## 2019-12-23 DIAGNOSIS — Z9221 Personal history of antineoplastic chemotherapy: Secondary | ICD-10-CM | POA: Diagnosis not present

## 2019-12-23 DIAGNOSIS — C7951 Secondary malignant neoplasm of bone: Secondary | ICD-10-CM

## 2019-12-23 DIAGNOSIS — R918 Other nonspecific abnormal finding of lung field: Secondary | ICD-10-CM | POA: Diagnosis not present

## 2019-12-23 DIAGNOSIS — Z923 Personal history of irradiation: Secondary | ICD-10-CM | POA: Insufficient documentation

## 2019-12-23 DIAGNOSIS — Z170 Estrogen receptor positive status [ER+]: Secondary | ICD-10-CM | POA: Insufficient documentation

## 2019-12-23 DIAGNOSIS — C50511 Malignant neoplasm of lower-outer quadrant of right female breast: Secondary | ICD-10-CM | POA: Diagnosis not present

## 2019-12-23 DIAGNOSIS — Z79899 Other long term (current) drug therapy: Secondary | ICD-10-CM | POA: Diagnosis not present

## 2019-12-23 DIAGNOSIS — C50919 Malignant neoplasm of unspecified site of unspecified female breast: Secondary | ICD-10-CM | POA: Diagnosis not present

## 2019-12-23 MED ORDER — LETROZOLE 2.5 MG PO TABS: 2.5000 mg | ORAL_TABLET | Freq: Every day | ORAL | 3 refills | Status: DC

## 2019-12-23 NOTE — Assessment & Plan Note (Signed)
Metastatic breast cancer PET-CT 01/14/16: Widespread metastatic disease right breast, right chest wall several nodules largest 3.7 cm, mediastinum, right internal mammary lymph node 2.2 cm, right pleural-based nodules 2.1cm and 2.7 cm, malignant pleural effusion, right common iliac lymph node, bone metastases in the thorax (sternum, manubrium, multiple thoracic vertebral bodies, multiple right-sided ribs)  Right chest wall mass biopsy 2:30 position 01/13/2016: Invasive ductal carcinoma focally involves skeletal muscle, grade 2, HER-2 negative ratio 1.08, ER/PR pending (Right breast cancer 1996 status post lumpectomy followed by chemotherapy followed by radiation and tamoxifen 5 years (details are not available)) ---------------------------------------------------------------------------------------------------------------------------------------------------------- Treatment plan: Ibrance with letrozole started 01/20/2016 , cycle 3 Ibrance 100 mg, Cycle 4 Ibrance 75 mg dose; switch to 75 mg 3 weeks on 2 weeks offfrom 07/22/2016, changed to 2 weeks on 2 weeks off12/12/2015.  Faslodex added 08/29/2019  CT CAP 12/16/2019: Mildly worsened bone metastatic disease enlargement of sclerotic metastatic lesion T11-L1, enlargement of soft tissue mass adjacent to mid right ureter 2.8 cm (previously was 2.5 cm) causing right hydronephrosis.  4 mm left upper lobe lung nodule previously was 2 mm and 3 mm right upper lobe nodule. Bone scan 12/16/2019: Osseous metastatic disease throughout the thoracic spine, L1 and sternum  Radiology review: I discussed with the patient that there is continued slow progression of metastatic disease in spite of adding Faslodex. Recommendation: Radiation oncology consult regarding the soft tissue mass adjacent to the ureter. Recheck with scans again in 3 months.

## 2020-01-02 ENCOUNTER — Inpatient Hospital Stay: Payer: Medicare HMO

## 2020-01-14 MED FILL — IBRANCE 75 MG TABS: 75 | 28 days supply | Qty: 14 | Fill #6

## 2020-01-22 ENCOUNTER — Other Ambulatory Visit: Payer: Self-pay | Admitting: Hematology and Oncology

## 2020-01-23 ENCOUNTER — Inpatient Hospital Stay: Payer: Medicare HMO

## 2020-01-23 NOTE — Progress Notes (Signed)
Oncology Supportive Care Medication Change  Jessica Zavala has insurance that requires a change in bone modifying agent from [place] to Zometa. Orders have been updated to reflect this change and scheduling message sent to adjust infusion appointments. Dr. Lindi Adie is okay with this plan.   The plan for zometa therapy is as follows: Zometa 3.3 mg IV every 12 weeks. Start zometa treatment on 03/23/20, which is next provider visit and lab appointment. Please ensure zometa dose is accurate for repeated baseline SCr/CrCL at that time.   [name] [facility] 01/23/2020

## 2020-01-24 ENCOUNTER — Telehealth: Payer: Self-pay | Admitting: Hematology and Oncology

## 2020-01-24 ENCOUNTER — Telehealth: Payer: Self-pay | Admitting: *Deleted

## 2020-01-24 NOTE — Telephone Encounter (Signed)
Scheduled per 2/19 sch msg. Left vm for pt with appt date and time.

## 2020-01-24 NOTE — Telephone Encounter (Signed)
Received message from scheduling stating pt apt on 2/18 was going to be re scheduled and pt son Jori Moll wanting to speak with RN if the injection was necessary.  Per MD pt needs to receive monthly Faslodex injections.  Attempt x1 to contact pt son, no answer, LVM stating pt will need injection and that scheduling will call to re schedule.  Message sent to scheduling department to schedule pt injection apt for Monday.

## 2020-01-25 ENCOUNTER — Other Ambulatory Visit: Payer: Self-pay

## 2020-01-25 ENCOUNTER — Inpatient Hospital Stay: Payer: Medicare HMO | Attending: Hematology and Oncology

## 2020-01-25 VITALS — BP 121/66 | HR 69 | Temp 97.8°F | Resp 18

## 2020-01-25 DIAGNOSIS — Z170 Estrogen receptor positive status [ER+]: Secondary | ICD-10-CM | POA: Diagnosis not present

## 2020-01-25 DIAGNOSIS — C7951 Secondary malignant neoplasm of bone: Secondary | ICD-10-CM | POA: Diagnosis not present

## 2020-01-25 DIAGNOSIS — C50511 Malignant neoplasm of lower-outer quadrant of right female breast: Secondary | ICD-10-CM

## 2020-01-25 MED ORDER — FULVESTRANT 250 MG/5ML IM SOLN
500.0000 mg | Freq: Once | INTRAMUSCULAR | Status: AC
  Administered 2020-01-25: 10:00:00 500 mg via INTRAMUSCULAR

## 2020-01-25 MED ORDER — FULVESTRANT 250 MG/5ML IM SOLN
INTRAMUSCULAR | Status: AC
  Filled 2020-01-25: qty 5

## 2020-01-25 NOTE — Patient Instructions (Signed)
Fulvestrant injection What is this medicine? FULVESTRANT (ful VES trant) blocks the effects of estrogen. It is used to treat breast cancer. This medicine may be used for other purposes; ask your health care provider or pharmacist if you have questions. COMMON BRAND NAME(S): FASLODEX What should I tell my health care provider before I take this medicine? They need to know if you have any of these conditions:  bleeding disorders  liver disease  low blood counts, like low white cell, platelet, or red cell counts  an unusual or allergic reaction to fulvestrant, other medicines, foods, dyes, or preservatives  pregnant or trying to get pregnant  breast-feeding How should I use this medicine? This medicine is for injection into a muscle. It is usually given by a health care professional in a hospital or clinic setting. Talk to your pediatrician regarding the use of this medicine in children. Special care may be needed. Overdosage: If you think you have taken too much of this medicine contact a poison control center or emergency room at once. NOTE: This medicine is only for you. Do not share this medicine with others. What if I miss a dose? It is important not to miss your dose. Call your doctor or health care professional if you are unable to keep an appointment. What may interact with this medicine?  medicines that treat or prevent blood clots like warfarin, enoxaparin, dalteparin, apixaban, dabigatran, and rivaroxaban This list may not describe all possible interactions. Give your health care provider a list of all the medicines, herbs, non-prescription drugs, or dietary supplements you use. Also tell them if you smoke, drink alcohol, or use illegal drugs. Some items may interact with your medicine. What should I watch for while using this medicine? Your condition will be monitored carefully while you are receiving this medicine. You will need important blood work done while you are taking  this medicine. Do not become pregnant while taking this medicine or for at least 1 year after stopping it. Women of child-bearing potential will need to have a negative pregnancy test before starting this medicine. Women should inform their doctor if they wish to become pregnant or think they might be pregnant. There is a potential for serious side effects to an unborn child. Men should inform their doctors if they wish to father a child. This medicine may lower sperm counts. Talk to your health care professional or pharmacist for more information. Do not breast-feed an infant while taking this medicine or for 1 year after the last dose. What side effects may I notice from receiving this medicine? Side effects that you should report to your doctor or health care professional as soon as possible:  allergic reactions like skin rash, itching or hives, swelling of the face, lips, or tongue  feeling faint or lightheaded, falls  pain, tingling, numbness, or weakness in the legs  signs and symptoms of infection like fever or chills; cough; flu-like symptoms; sore throat  vaginal bleeding Side effects that usually do not require medical attention (report to your doctor or health care professional if they continue or are bothersome):  aches, pains  constipation  diarrhea  headache  hot flashes  nausea, vomiting  pain at site where injected  stomach pain This list may not describe all possible side effects. Call your doctor for medical advice about side effects. You may report side effects to FDA at 1-800-FDA-1088. Where should I keep my medicine? This drug is given in a hospital or clinic and will   not be stored at home. NOTE: This sheet is a summary. It may not cover all possible information. If you have questions about this medicine, talk to your doctor, pharmacist, or health care provider.  2020 Elsevier/Gold Standard (2018-03-01 11:34:41)  

## 2020-01-26 ENCOUNTER — Ambulatory Visit: Payer: Medicare HMO

## 2020-02-08 ENCOUNTER — Other Ambulatory Visit: Payer: Self-pay | Admitting: Hematology and Oncology

## 2020-02-08 DIAGNOSIS — C50511 Malignant neoplasm of lower-outer quadrant of right female breast: Secondary | ICD-10-CM

## 2020-02-08 DIAGNOSIS — Z170 Estrogen receptor positive status [ER+]: Secondary | ICD-10-CM

## 2020-02-10 MED FILL — IBRANCE 75 MG TABS: 75 | 28 days supply | Qty: 14 | Fill #0

## 2020-02-20 ENCOUNTER — Other Ambulatory Visit: Payer: Self-pay

## 2020-02-20 ENCOUNTER — Inpatient Hospital Stay: Payer: Medicare HMO | Attending: Hematology and Oncology

## 2020-02-20 VITALS — BP 115/63 | HR 71 | Temp 97.8°F | Resp 18

## 2020-02-20 DIAGNOSIS — C7951 Secondary malignant neoplasm of bone: Secondary | ICD-10-CM | POA: Insufficient documentation

## 2020-02-20 DIAGNOSIS — C50511 Malignant neoplasm of lower-outer quadrant of right female breast: Secondary | ICD-10-CM | POA: Diagnosis not present

## 2020-02-20 DIAGNOSIS — Z170 Estrogen receptor positive status [ER+]: Secondary | ICD-10-CM | POA: Diagnosis not present

## 2020-02-20 MED ORDER — FULVESTRANT 250 MG/5ML IM SOLN
INTRAMUSCULAR | Status: AC
  Filled 2020-02-20: qty 10

## 2020-02-20 MED ORDER — FULVESTRANT 250 MG/5ML IM SOLN
500.0000 mg | Freq: Once | INTRAMUSCULAR | Status: AC
  Administered 2020-02-20: 500 mg via INTRAMUSCULAR

## 2020-02-20 NOTE — Patient Instructions (Signed)
Fulvestrant injection What is this medicine? FULVESTRANT (ful VES trant) blocks the effects of estrogen. It is used to treat breast cancer. This medicine may be used for other purposes; ask your health care provider or pharmacist if you have questions. COMMON BRAND NAME(S): FASLODEX What should I tell my health care provider before I take this medicine? They need to know if you have any of these conditions:  bleeding disorders  liver disease  low blood counts, like low white cell, platelet, or red cell counts  an unusual or allergic reaction to fulvestrant, other medicines, foods, dyes, or preservatives  pregnant or trying to get pregnant  breast-feeding How should I use this medicine? This medicine is for injection into a muscle. It is usually given by a health care professional in a hospital or clinic setting. Talk to your pediatrician regarding the use of this medicine in children. Special care may be needed. Overdosage: If you think you have taken too much of this medicine contact a poison control center or emergency room at once. NOTE: This medicine is only for you. Do not share this medicine with others. What if I miss a dose? It is important not to miss your dose. Call your doctor or health care professional if you are unable to keep an appointment. What may interact with this medicine?  medicines that treat or prevent blood clots like warfarin, enoxaparin, dalteparin, apixaban, dabigatran, and rivaroxaban This list may not describe all possible interactions. Give your health care provider a list of all the medicines, herbs, non-prescription drugs, or dietary supplements you use. Also tell them if you smoke, drink alcohol, or use illegal drugs. Some items may interact with your medicine. What should I watch for while using this medicine? Your condition will be monitored carefully while you are receiving this medicine. You will need important blood work done while you are taking  this medicine. Do not become pregnant while taking this medicine or for at least 1 year after stopping it. Women of child-bearing potential will need to have a negative pregnancy test before starting this medicine. Women should inform their doctor if they wish to become pregnant or think they might be pregnant. There is a potential for serious side effects to an unborn child. Men should inform their doctors if they wish to father a child. This medicine may lower sperm counts. Talk to your health care professional or pharmacist for more information. Do not breast-feed an infant while taking this medicine or for 1 year after the last dose. What side effects may I notice from receiving this medicine? Side effects that you should report to your doctor or health care professional as soon as possible:  allergic reactions like skin rash, itching or hives, swelling of the face, lips, or tongue  feeling faint or lightheaded, falls  pain, tingling, numbness, or weakness in the legs  signs and symptoms of infection like fever or chills; cough; flu-like symptoms; sore throat  vaginal bleeding Side effects that usually do not require medical attention (report to your doctor or health care professional if they continue or are bothersome):  aches, pains  constipation  diarrhea  headache  hot flashes  nausea, vomiting  pain at site where injected  stomach pain This list may not describe all possible side effects. Call your doctor for medical advice about side effects. You may report side effects to FDA at 1-800-FDA-1088. Where should I keep my medicine? This drug is given in a hospital or clinic and will   not be stored at home. NOTE: This sheet is a summary. It may not cover all possible information. If you have questions about this medicine, talk to your doctor, pharmacist, or health care provider.  2020 Elsevier/Gold Standard (2018-03-01 11:34:41)  

## 2020-02-27 DIAGNOSIS — Z6824 Body mass index (BMI) 24.0-24.9, adult: Secondary | ICD-10-CM | POA: Diagnosis not present

## 2020-02-27 DIAGNOSIS — Z01419 Encounter for gynecological examination (general) (routine) without abnormal findings: Secondary | ICD-10-CM | POA: Diagnosis not present

## 2020-03-06 MED FILL — IBRANCE 75 MG TABS: 75 | 28 days supply | Qty: 14 | Fill #1

## 2020-03-20 ENCOUNTER — Telehealth: Payer: Self-pay | Admitting: Hematology and Oncology

## 2020-03-20 NOTE — Telephone Encounter (Signed)
Rescheduled 04/19 appointment to 04/20, called patient and left a voicemail.

## 2020-03-23 ENCOUNTER — Inpatient Hospital Stay: Payer: Medicare HMO | Admitting: Hematology and Oncology

## 2020-03-23 ENCOUNTER — Inpatient Hospital Stay: Payer: Medicare HMO

## 2020-03-23 ENCOUNTER — Ambulatory Visit: Payer: Medicare HMO

## 2020-03-23 NOTE — Progress Notes (Signed)
Patient Care Team: [name], MD as PCP - General (Internal Medicine) [name], MD as Consulting Physician (Hematology and Oncology)  DIAGNOSIS:    ICD-10-CM   1. Malignant neoplasm of lower-outer quadrant of right breast of female, estrogen receptor positive ([place])  C50.511    Z17.0     SUMMARY OF ONCOLOGIC HISTORY: Oncology History  Breast cancer of lower-outer quadrant of right female breast ([place])  01/04/1995 Surgery   Right breast cancer status post lumpectomy followed by chemotherapy followed by radiation and tamoxifen 5 years (details are not available)   12/30/2015 Relapse/Recurrence   Chest wall/ soft tissue masses near the right side of the sternum, superior nodule measuring 14.5 mm, inferior nodule measuring 10.5 mm, 26 mm anterior mediastinal soft tissue mass, T9 bone metastases   12/30/2015 Procedure   Thoracentesis: 400 mL removed; showed reactive mesothelial cells, no cancer cells   12/30/2015 Imaging   CT chest: 26 x 17 mm anterior mediastinal soft tissue mass, mixed lytic and sclerotic bone mets mainly involving sternum and T9 vertebral body, scattered vertebral body involvement and rib involvement; chest wall soft tissue masses 14.5 mm, 10.5 mm   01/13/2016 Initial Biopsy   Right chest wall mass biopsy 2:30 position: Invasive ductal carcinoma focally involves skeletal muscle, grade 2, HER-2 negative ratio 1.08, ER/PR 95% positive   01/14/2016 PET scan   Widespread metastatic disease right breast, right chest wall, mediastinum, right internal mammary lymph nodes, right pleural space, malignant pleural effusion, right common iliac lymph node, bone metastases in the thorax   01/21/2016 -  Anti-estrogen oral therapy   Ibrance with letrozole   04/15/2017 - 04/23/2017 Hospital Admission   Pneumonia and confusion     CHIEF COMPLIANT: Follow-up of metastatic breast cancer on Ibrance withFaslodex  INTERVAL HISTORY: Jessica Zavala is a 74 y.o. with above-mentioned  history of metastatic breast cancercurrently on treatment withIbrance and Faslodex. She presents to the clinic today for follow-up.  ALLERGIES:  is allergic to aspirin.  MEDICATIONS:  Current Outpatient Medications  Medication Sig Dispense Refill  . atorvastatin (LIPITOR) 20 MG tablet Take 1 tablet (20 mg total) by mouth daily. 30 tablet 11  . clobetasol cream (TEMOVATE) 1.61 % Apply 1 application topically 2 (two) times daily as needed. 30 g 0  . clotrimazole-betamethasone (LOTRISONE) cream Apply 1 application topically 2 (two) times daily as needed (skin irritation). 45 g 0  . furosemide (LASIX) 20 MG tablet Take 1 tablet (20 mg total) by mouth daily. 30 tablet 0  . IBRANCE 75 MG tablet TAKE 1 TABLET (75 MG TOTAL) BY MOUTH DAILY. TAKE FOR 14 DAYS ON, 14 DAYS OFF, REPEAT EVERY 28 DAYS. 14 tablet 6  . labetalol (NORMODYNE) 200 MG tablet Take 1 tablet (200 mg total) by mouth 2 (two) times daily. 30 tablet 1  . letrozole (FEMARA) 2.5 MG tablet Take 1 tablet (2.5 mg total) by mouth daily. 90 tablet 3   No current facility-administered medications for this visit.    PHYSICAL EXAMINATION: ECOG PERFORMANCE STATUS: 1 - Symptomatic but completely ambulatory  Vitals:   03/24/20 1411  BP: (!) 173/73  Pulse: 66  Resp: 18  Temp: 98.2 F (36.8 C)  SpO2: 100%   Filed Weights   03/24/20 1411  Weight: 115 lb 9.6 oz (52.4 kg)    LABORATORY DATA:  I have reviewed the data as listed CMP Latest Ref Rng & Units 12/02/2019 11/07/2019 10/10/2019  Glucose 70 - 99 mg/dL 98 104(H) 100(H)  BUN 8 -  23 mg/dL 27(H) 22 23  Creatinine 0.44 - 1.00 mg/dL 0.99 0.99 1.12(H)  Sodium 135 - 145 mmol/L 137 138 138  Potassium 3.5 - 5.1 mmol/L 4.2 4.8 4.3  Chloride 98 - 111 mmol/L 97(L) 101 97(L)  CO2 22 - 32 mmol/L '29 30 30  ' Calcium 8.9 - 10.3 mg/dL 9.3 8.6(L) 9.4  Total Protein 6.5 - 8.1 g/dL 7.8 7.3 7.7  Total Bilirubin 0.3 - 1.2 mg/dL 0.3 0.4 0.5  Alkaline Phos 38 - 126 U/L 51 51 52  AST 15 - 41 U/L '31 25  23  ' ALT 0 - 44 U/L '16 14 14    ' Lab Results  Component Value Date   WBC 2.2 (L) 03/24/2020   HGB 10.9 (L) 03/24/2020   HCT 31.7 (L) 03/24/2020   MCV 97.5 03/24/2020   PLT 249 03/24/2020   NEUTROABS 0.9 (L) 03/24/2020    ASSESSMENT & PLAN:  Breast cancer of lower-outer quadrant of right female breast ([place]) Metastatic breast cancer PET-CT 01/14/16: Widespread metastatic disease right breast, right chest wall several nodules largest 3.7 cm, mediastinum, right internal mammary lymph node 2.2 cm, right pleural-based nodules 2.1cm and 2.7 cm, malignant pleural effusion, right common iliac lymph node, bone metastases in the thorax (sternum, manubrium, multiple thoracic vertebral bodies, multiple right-sided ribs)  Right chest wall mass biopsy 2:30 position 01/13/2016: Invasive ductal carcinoma focally involves skeletal muscle, grade 2, HER-2 negative ratio 1.08, ER/PR pending (Right breast cancer 1996 status post lumpectomy followed by chemotherapy followed by radiation and tamoxifen 5 years (details are not available)) ---------------------------------------------------------------------------------------------------------------------------------------------------------- Treatment plan: Ibrance with letrozole started 01/20/2016 , cycle 3 Ibrance 100 mg, Cycle 4 Ibrance 75 mg dose; switch to 75 mg 3 weeks on 2 weeks offfrom 07/22/2016, changed to 2 weeks on 2 weeks off12/12/2015.  Faslodex added 08/29/2019  CT CAP 12/16/2019: Mildly worsened bone metastatic disease enlargement of sclerotic metastatic lesion T11-L1, enlargement of soft tissue mass adjacent to mid right ureter 2.8 cm (previously was 2.5 cm) causing right hydronephrosis.  4 mm left upper lobe lung nodule previously was 2 mm and 3 mm right upper lobe nodule. Bone scan 12/16/2019: Osseous metastatic disease throughout the thoracic spine, L1 and sternum  Radiology review: I discussed with the patient that there is continued slow  progression of metastatic disease in spite of adding Faslodex. Bone metastases: Patient's insurance approved Zometa and not Xgeva.  She will receive her Zometa infusion today.  Recommendation: 1.  Urology follow-up on May 10 2.  Scans for restaging 3.  Based on the scans radiation oncology consultation (Dr. Sondra Come)  Return to clinic after scans to discuss results    No orders of the defined types were placed in this encounter.  The patient has a good understanding of the overall plan. she agrees with it. she will call with any problems that may develop before the next visit here.  Total time spent: 30 mins including face to face time and time spent for planning, charting and coordination of care  [name], MD 03/24/2020  I, Cloyde Reams Dorshimer, am acting as scribe for Dr. [name].  I have reviewed the above documentation for accuracy and completeness, and I agree with the above.

## 2020-03-24 ENCOUNTER — Other Ambulatory Visit: Payer: Self-pay

## 2020-03-24 ENCOUNTER — Other Ambulatory Visit: Payer: Medicare HMO

## 2020-03-24 ENCOUNTER — Inpatient Hospital Stay: Payer: Medicare HMO

## 2020-03-24 ENCOUNTER — Encounter: Payer: Self-pay | Admitting: Hematology and Oncology

## 2020-03-24 ENCOUNTER — Inpatient Hospital Stay (HOSPITAL_BASED_OUTPATIENT_CLINIC_OR_DEPARTMENT_OTHER): Payer: Medicare HMO | Admitting: Hematology and Oncology

## 2020-03-24 ENCOUNTER — Inpatient Hospital Stay: Payer: Medicare HMO | Attending: Hematology and Oncology

## 2020-03-24 DIAGNOSIS — C50511 Malignant neoplasm of lower-outer quadrant of right female breast: Secondary | ICD-10-CM | POA: Insufficient documentation

## 2020-03-24 DIAGNOSIS — Z923 Personal history of irradiation: Secondary | ICD-10-CM | POA: Diagnosis not present

## 2020-03-24 DIAGNOSIS — Z9221 Personal history of antineoplastic chemotherapy: Secondary | ICD-10-CM | POA: Insufficient documentation

## 2020-03-24 DIAGNOSIS — C7951 Secondary malignant neoplasm of bone: Secondary | ICD-10-CM | POA: Diagnosis not present

## 2020-03-24 DIAGNOSIS — Z79811 Long term (current) use of aromatase inhibitors: Secondary | ICD-10-CM | POA: Insufficient documentation

## 2020-03-24 DIAGNOSIS — Z79899 Other long term (current) drug therapy: Secondary | ICD-10-CM | POA: Diagnosis not present

## 2020-03-24 DIAGNOSIS — C781 Secondary malignant neoplasm of mediastinum: Secondary | ICD-10-CM | POA: Diagnosis not present

## 2020-03-24 DIAGNOSIS — J910 Malignant pleural effusion: Secondary | ICD-10-CM | POA: Diagnosis not present

## 2020-03-24 DIAGNOSIS — Z5111 Encounter for antineoplastic chemotherapy: Secondary | ICD-10-CM | POA: Insufficient documentation

## 2020-03-24 DIAGNOSIS — C7989 Secondary malignant neoplasm of other specified sites: Secondary | ICD-10-CM | POA: Insufficient documentation

## 2020-03-24 DIAGNOSIS — Z170 Estrogen receptor positive status [ER+]: Secondary | ICD-10-CM | POA: Diagnosis not present

## 2020-03-24 DIAGNOSIS — C775 Secondary and unspecified malignant neoplasm of intrapelvic lymph nodes: Secondary | ICD-10-CM | POA: Diagnosis not present

## 2020-03-24 LAB — CBC WITH DIFFERENTIAL (CANCER CENTER ONLY)
Abs Immature Granulocytes: 0.01 10*3/uL (ref 0.00–0.07)
Basophils Absolute: 0 10*3/uL (ref 0.0–0.1)
Basophils Relative: 1 %
Eosinophils Absolute: 0.2 10*3/uL (ref 0.0–0.5)
Eosinophils Relative: 9 %
HCT: 31.7 % — ABNORMAL LOW (ref 36.0–46.0)
Hemoglobin: 10.9 g/dL — ABNORMAL LOW (ref 12.0–15.0)
Immature Granulocytes: 0 %
Lymphocytes Relative: 42 %
Lymphs Abs: 0.9 10*3/uL (ref 0.7–4.0)
MCH: 33.5 pg (ref 26.0–34.0)
MCHC: 34.4 g/dL (ref 30.0–36.0)
MCV: 97.5 fL (ref 80.0–100.0)
Monocytes Absolute: 0.2 10*3/uL (ref 0.1–1.0)
Monocytes Relative: 8 %
Neutro Abs: 0.9 10*3/uL — ABNORMAL LOW (ref 1.7–7.7)
Neutrophils Relative %: 40 %
Platelet Count: 249 10*3/uL (ref 150–400)
RBC: 3.25 MIL/uL — ABNORMAL LOW (ref 3.87–5.11)
RDW: 15 % (ref 11.5–15.5)
WBC Count: 2.2 10*3/uL — ABNORMAL LOW (ref 4.0–10.5)
nRBC: 0 % (ref 0.0–0.2)

## 2020-03-24 LAB — CMP (CANCER CENTER ONLY)
ALT: 12 U/L (ref 0–44)
AST: 25 U/L (ref 15–41)
Albumin: 3.8 g/dL (ref 3.5–5.0)
Alkaline Phosphatase: 62 U/L (ref 38–126)
Anion gap: 9 (ref 5–15)
BUN: 21 mg/dL (ref 8–23)
CO2: 30 mmol/L (ref 22–32)
Calcium: 9.1 mg/dL (ref 8.9–10.3)
Chloride: 97 mmol/L — ABNORMAL LOW (ref 98–111)
Creatinine: 1.16 mg/dL — ABNORMAL HIGH (ref 0.44–1.00)
GFR, Est AFR Am: 53 mL/min — ABNORMAL LOW (ref >60)
GFR, Estimated: 46 mL/min — ABNORMAL LOW (ref >60)
Glucose, Bld: 83 mg/dL (ref 70–99)
Potassium: 3.6 mmol/L (ref 3.5–5.1)
Sodium: 136 mmol/L (ref 135–145)
Total Bilirubin: 0.4 mg/dL (ref 0.3–1.2)
Total Protein: 7.8 g/dL (ref 6.5–8.1)

## 2020-03-24 MED ORDER — FULVESTRANT 250 MG/5ML IM SOLN
500.0000 mg | Freq: Once | INTRAMUSCULAR | Status: AC
  Administered 2020-03-24: 17:00:00 500 mg via INTRAMUSCULAR

## 2020-03-24 MED ORDER — ZOLEDRONIC ACID 4 MG/100ML IV SOLN
INTRAVENOUS | Status: AC
  Filled 2020-03-24: qty 100

## 2020-03-24 MED ORDER — FULVESTRANT 250 MG/5ML IM SOLN
INTRAMUSCULAR | Status: AC
  Filled 2020-03-24: qty 10

## 2020-03-24 MED ORDER — ZOLEDRONIC ACID 4 MG/5ML IV CONC
3.0000 mg | Freq: Once | INTRAVENOUS | Status: AC
  Administered 2020-03-24: 3 mg via INTRAVENOUS
  Filled 2020-03-24: qty 3.75

## 2020-03-24 MED ORDER — SODIUM CHLORIDE 0.9 % IV SOLN
Freq: Once | INTRAVENOUS | Status: AC
  Filled 2020-03-24: qty 250

## 2020-03-24 NOTE — Patient Instructions (Signed)
Zoledronic Acid injection (Hypercalcemia, Oncology) ?y l thu?c g? ACID ZOLEDRONIC lm gi?m l??ng calci hao h?t t? x??ng. N ???c dng ?? ?i?u tr? tnh tr?ng c qu nhi?u calci trong mu do ung th?. N c?ng ???c dng ?? phng ng?a cc bi?n ch?ng c?a ung th? ? lan ??n x??ng. Thu?c ny c th? ???c dng cho nh?ng m?c ?ch khc; hy h?i ng??i cung c?p d?ch v? y t? ho?c d??c s? c?a mnh, n?u qu v? c th?c m?c. (CC) NHN HI?U PH? BI?N: [place] Ti c?n ph?i bo cho ng??i cung c?p d?ch v? y t? c?a mnh ?i?u g tr??c khi dng thu?c ny? H? c?n bi?t li?u qu v? c b?t k? tnh tr?ng no sau ?y khng:  b?nh hen suy?n m?n c?m v?i aspirin  ung th?, ??c bi?t l khi qu v? ?ang dng thu?c tr? ung th?  b?nh v? nha khoa ho?c mang r?ng gi?  nhi?m trng  b?nh th?n  ?ang dng cc thu?c c ch?a corticosteroid, ch?ng h?n nh? dexamethasone ho?c prednisone  pha?n ??ng b?t th???ng ho??c di? ??ng v??i acid zoledronic  pha?n ??ng b?t th???ng ho??c di? ??ng v??i ca?c d??c ph?m kha?c  pha?n ??ng b?t th???ng ho??c di? ??ng v??i th??c ph?m, thu?c nhu?m, ho??c ch?t ba?o qua?n  ?ang c thai ho??c ??nh co? thai  ?ang cho con bu? Ti nn s? d?ng thu?c ny nh? th? no? Thu?c ny ?? truy?n vo t?nh m?ch. Thu?c ny ???c s? d?ng b?i chuyn vin y t? ? b?nh vi?n ho?c ? phng m?ch. Hy bn v?i bc s? nhi khoa c?a qu v? v? vi?c dng thu?c ny ? tr? em. C th? c?n ch?m [place] ??c bi?t. Qu li?u: N?u qu v? cho r?ng mnh ? dng qu nhi?u thu?c ny, th hy lin l?c v?i trung tm ki?m sot ch?t ??c ho?c phng c?p c?u ngay l?p t?c. L?U : Thu?c ny ch? dnh ring cho qu v?. Khng chia s? thu?c ny v?i nh?ng ng??i khc. N?u ti l? qun m?t li?u th sao? ?i?u quan tr?ng l khng nn b? l? li?u thu?c no. Hy lin l?c v?i bc s? ho?c [place] vin y t? c?a mnh, n?u qu v? khng th? gi? ?ng cu?c h?n khm. Nh?ng g c th? t??ng tc v?i thu?c ny?  m?t s? thu?c khng sinh d?ng tim  Cc thu?c khng vim khng ph?i  steroid (Non steroidal anti-inflamation drug - NSAID), cc thu?c gi?m ?au v khng vim, ch?ng h?n nh? ibuprofen, naproxen  m?t s? thu?c l?i ti?u, ch?ng h?n nh? bumetanide, furosemide  teriparatide  thalidomide [facility] sch ny c th? khng m t? ?? h?t cc t??ng tc c th? x?y ra. Hy ??a cho ng??i cung c?p d?ch v? y t? c?a mnh danh sch t?t c? cc thu?c, th?o d??c, cc thu?c khng c?n toa, ho?c cc ch? ph?m b? sung m qu v? dng. C?ng nn bo cho h? bi?t r?ng qu v? c ht thu?c, u?ng r??u, ho?c c s? d?ng ma ty tri php hay khng. Vi th? c th? t??ng tc v?i thu?c c?a qu v?. Ti c?n ph?i theo di ?i?u g trong khi dng thu?c ny? Hy ??n g?p bc s? ho?c [place] vin y t? ?? theo di ??nh k? s?c kh?e c?a mnh. C th? m?t m?t th?i gian ?? qu v? th?y ???c l?i ch c?a thu?c ny. Khng ???c ng?ng s? d?ng thu?c ny, ngo?i tr? ?ang lm theo l?i khuyn c?a bc s?. Bc s? c th? yu c?u th?  mu v lm cc xt nghi?m khc ?? theo di s? c?i thi?n c?a qu v?. Ph? n? c?n ph?i thng bo cho bc s? c?a mnh, n?u mu?n c thai ho?c ngh? r?ng c th? mnh ? c [place]. C nguy c? v? cc tc d?ng ph? nghim tr?ng ??i v?i [place] nhi. Hy th?o lu?n v?i bc s? ho?c chuyn vin y t? ho?c d??c s? ?? bi?t thm thng tin. Qu v? nn ch?c ch?n r?ng mnh nh?n ???c ?? calci v vitamin D trong khi dng thu?c ny. Hy th?o lu?n v?i bc s? ho?c chuyn vin y t? v? nh?ng th?c ph?m qu v? ?n v nh?ng vitamin qu v? dng. M?t s? ng??i dng thu?c ny b? ?au c? b?p, ?au kh?p ho?c ?au x??ng nghim tr?ng. Thu?c ny c?ng c th? lm t?ng nguy c? b? cc v?n ?? ? hm ho?c b? gy x??ng ?i. N?u qu v? b? ?au d? d?i ? hm, x??ng, kh?p, ho?c c?, th hy bo cho bc s? c?a mnh bi?t ngay l?p t?c. Hy bo cho bc s? n?u qu v? c c?n ?au khng d?t ho?c tr? nn n?ng h?n. Hy bo cho nha s? ho?c ph?u thu?t vin nha khoa c?a mnh bi?t l qu v? ?ang dng thu?c ny. Qu v? khng nn c cu?c ??i gi?i ph?u nha khoa trong th?i gian ?ang dng thu?c ny.  Hy ??n g?p nha s? c?a mnh ?? khm r?ng v ch?a m?i v?n ?? v? nha khoa tr??c khi b?t ??u dng thu?c ny. Hy ch?m [place] r?ng cho t?t trong th?i gian dng thu?c ny. B?o ??m r?ng qu v? ?i nha s? theo cc cu?c h?n ti khm th??ng k?. Ti c th? nh?n th?y nh?ng tc d?ng ph? no khi dng thu?c ny? Nh?ng tc d?ng ph? qu v? c?n ph?i bo cho bc s? ho?c chuyn vin y t? cng s?m cng t?t:  cc ph?n ?ng d? ?ng, ch?ng h?n nh? da b? m?n ??, ng?a, n?i my ?ay, s?ng ? m?t, mi, ho?c l??i  lo u, l l?n, ho?c tr?m c?m  kh th?  thay ??i th? l?c  ?au ? m?t  c?m th?y chong vng, ng?t x?u, b? t  ?au hm, ??c bi?t l sau khi lm r?ng  lot mi?ng  co rt, c?ng ?? ho?c y?u c? b?p  m?n ??, r?p da, bong ho?c trc da, bao g?m bn trong mi?ng.  kh ?i ti?u ho?c thay ??i l??ng n??c ti?u ???c bi ti?t Cc tc d?ng ph? khng c?n ph?i ch?m [place] y t? (hy bo cho bc s? ho?c chuyn vin y t?, n?u cc tc d?ng ph? ny ti?p di?n ho?c gy phi?n toi):  ?au ? x??ng, kh?p, ho?c c? b?p  to bn  tiu ch?y  s?t  r?ng tc  kch ?ng ? ch? tim  m?t c?m gic ngon mi?ng  bu?n i ho?c i m?a  kh ch?u ? bao t?  kh ng?  kh nu?t  m?t m?i ho?c y?u ?t [facility] ny c th? khng m t? ?? h?t cc tc d?ng ph? c th? x?y ra. Xin g?i t?i bc s? c?a mnh ?? ???c c? v?n chuyn mn v? cc tc d?ng ph?Sander Nephew v? c th? t??ng trnh cc tc d?ng ph? cho FDA theo s? [phone]. Ti nn c?t gi? thu?c c?a mnh ? ?u? Thu?c ny ???c s? d?ng b?i chuyn vin y t? ? b?nh vi?n ho?c ? phng m?ch. Qu v? s? khng ???c c?p thu?c ny ?? c?t  gi? t?i nh. L?U : ?y l b?n tm t?t. N c th? khng bao hm t?t c? thng tin c th? c. N?u qu v? th?c m?c v? thu?c ny, xin trao ??i v?i bc s?, d??c s?, ho?c ng??i cung c?p d?ch v? y t? c?a mnh.  2020 Elsevier/Gold Standard (2016-12-22 00:00:00)  Fulvestrant injection ?y l thu?c g? FULVESTRANT ch?n tc d?ng c?a estrogen. Thu?c ny ???c dng ?? ?i?u tr? ung th?  v. Thu?c ny c th? ???c dng cho nh?ng m?c ?ch khc; hy h?i ng??i cung c?p d?ch v? y t? ho?c d??c s? c?a mnh, n?u qu v? c th?c m?c. (CC) NHN HI?U PH? BI?N: FASLODEX Ti c?n ph?i bo cho ng??i cung c?p d?ch v? y t? c?a mnh ?i?u g tr??c khi dng thu?c ny? H? c?n bi?t li?u qu v? c b?t k? tnh tr?ng no sau ?y khng:  cc v?n ?? v? ch?y mu  b?nh gan  s? l??ng t? bo mu th?p, ch?ng h?n nh? s? l??ng b?ch c?u, ti?u c?u, ho?c h?ng c?u th?p  pha?n ??ng b?t th???ng ho??c di? ??ng v??i fulvestrant  pha?n ??ng b?t th???ng ho??c di? ??ng v??i ca?c d??c ph?m kha?c  pha?n ??ng b?t th???ng ho??c di? ??ng v??i th??c ph?m, thu?c nhu?m, ho??c ch?t ba?o qua?n  ?ang c thai ho??c ??nh co? thai  ?ang cho con bu? Ti nn s? d?ng thu?c ny nh? th? no? Thu?c ny ?? tim vo b?p th?t. Thu?c ny th??ng ???c s? d?ng b?i chuyn vin y t? trong b?nh vi?n ho?c phng m?ch. Hy bn v?i bc s? nhi khoa c?a qu v? v? vi?c dng thu?c ny ? tr? em. C th? c?n ch?m [place] ??c bi?t. Qu li?u: N?u qu v? cho r?ng mnh ? dng qu nhi?u thu?c ny, th hy lin l?c v?i trung tm ki?m sot ch?t ??c ho?c phng c?p c?u ngay l?p t?c. L?U : Thu?c ny ch? dnh ring cho qu v?. Khng chia s? thu?c ny v?i nh?ng ng??i khc. N?u ti l? qun m?t li?u th sao? ?i?u quan tr?ng l khng nn b? l? li?u thu?c no. Hy lin l?c v?i bc s? ho?c [place] vin y t? c?a mnh, n?u qu v? khng th? gi? ?ng cu?c h?n khm. Nh?ng g c th? t??ng tc v?i thu?c ny?  m?t s? thu?c ?i?u tr? ho?c phng ng?a c?c mu ?ng, ch?ng h?n nh? warfarin, enoxaparin, dalteparin, apixaban, dabigatran, v rivaroxaban [facility] sch ny c th? khng m t? ?? h?t cc t??ng tc c th? x?y ra. Hy ??a cho ng??i cung c?p d?ch v? y t? c?a mnh danh sch t?t c? cc thu?c, th?o d??c, cc thu?c khng c?n toa, ho?c cc ch? ph?m b? sung m qu v? dng. C?ng nn bo cho h? bi?t r?ng qu v? c ht thu?c, u?ng r??u, ho?c c s? d?ng ma ty tri php hay khng. Vi  th? c th? t??ng tc v?i thu?c c?a qu v?. Ti c?n ph?i theo di ?i?u g trong khi dng thu?c ny? Qu v? s? ???c theo di ch?t ch? trong khi dng thu?c ny. Qu v? s? c?n ph?i ?i lm cc xt nghi?m mu quan tr?ng trong th?i gian dng thu?c ny. Khng ???c c thai trong th?i gian dng thu?c ny ho?c trong vng t?i thi?u 1 n?m sau khi ng?ng dng thu?c. Phu? n?? co? kha? n?ng mang thai c?n pha?i co? xt nghi?m th? [place] m tnh tr??c khi b?t ??u dng thu?c na?y. Ph? n? c?n ph?i thng bo  cho bc s? c?a mnh, n?u mu?n c thai ho?c ngh? r?ng c th? mnh ? c [place]. C nguy c? v? cc tc d?ng ph? nghim tr?ng ??i v?i [place] nhi. Nam gi?i c?n ph?i thng bo cho bc s? c?a mnh, n?u h? mu?n ???c lm b?. Thu?c ny c th? lm gi?m s? l??ng tinh trng. Hy th?o lu?n v?i bc s? ho?c chuyn vin y t? ho?c d??c s? ?? bi?t thm thng tin. Khng ???c cho con b s?a m? trong khi dng thu?c ny ho?c trong vng 1 n?m sau li?u thu?c cng. Ti c th? nh?n th?y nh?ng tc d?ng ph? no khi dng thu?c ny? Nh?ng tc d?ng ph? qu v? c?n ph?i bo cho bc s? ho?c chuyn vin y t? cng s?m cng t?t:  cc ph?n ?ng d? ?ng, ch?ng h?n nh? da b? m?n ??, ng?a, n?i my ?ay, s?ng ? m?t, mi, ho?c l??i  c?m th?y chong vng, ng?t x?u, b? t  b? ?au, c?m gic nh? b? ki?n b, t, ho?c y?u ? chn  c cc d?u hi?u v tri?u ch?ng nhi?m trng, ch?ng h?n nh? s?t ho?c ?n l?nh; ho; cc tri?u ch?ng gi?ng nh? cm; ?au h?ng  xu?t huy?t m ??o Cc tc d?ng ph? khng c?n ph?i ch?m [place] y t? (hy bo cho bc s? ho?c chuyn vin y t?, n?u cc tc d?ng ph? ny ti?p di?n ho?c gy phi?n toi):  ?au v nh?c  to bn  tiu ch?y  ?au ??u  c?n b?c h?a  bu?n i ho?c i m?a  ?au ? ch? tim  ?au b?ng [facility] ny c th? khng m t? ?? h?t cc tc d?ng ph? c th? x?y ra. Xin g?i t?i bc s? c?a mnh ?? ???c c? v?n chuyn mn v? cc tc d?ng ph?Sander Nephew v? c th? t??ng trnh cc tc d?ng ph? cho FDA theo s? [phone]. Ti nn c?t gi? thu?c c?a  mnh ? ?u? Thu?c ny ???c s? d?ng b?i chuyn vin y t? ? b?nh vi?n ho?c ? phng m?ch. Qu v? s? khng ???c c?p thu?c ny ?? c?t gi? t?i nh. L?U : ?y l b?n tm t?t. N c th? khng bao hm t?t c? thng tin c th? c. N?u qu v? th?c m?c v? thu?c ny, xin trao ??i v?i bc s?, d??c s?, ho?c ng??i cung c?p d?ch v? y t? c?a mnh.  2020 Elsevier/Gold Standard (2018-05-07 00:00:00)

## 2020-03-24 NOTE — Assessment & Plan Note (Signed)
Metastatic breast cancer PET-CT 01/14/16: Widespread metastatic disease right breast, right chest wall several nodules largest 3.7 cm, mediastinum, right internal mammary lymph node 2.2 cm, right pleural-based nodules 2.1cm and 2.7 cm, malignant pleural effusion, right common iliac lymph node, bone metastases in the thorax (sternum, manubrium, multiple thoracic vertebral bodies, multiple right-sided ribs)  Right chest wall mass biopsy 2:30 position 01/13/2016: Invasive ductal carcinoma focally involves skeletal muscle, grade 2, HER-2 negative ratio 1.08, ER/PR pending (Right breast cancer 1996 status post lumpectomy followed by chemotherapy followed by radiation and tamoxifen 5 years (details are not available)) ---------------------------------------------------------------------------------------------------------------------------------------------------------- Treatment plan: Ibrance with letrozole started 01/20/2016 , cycle 3 Ibrance 100 mg, Cycle 4 Ibrance 75 mg dose; switch to 75 mg 3 weeks on 2 weeks offfrom 07/22/2016, changed to 2 weeks on 2 weeks off12/12/2015.  Faslodex added 08/29/2019  CT CAP 12/16/2019: Mildly worsened bone metastatic disease enlargement of sclerotic metastatic lesion T11-L1, enlargement of soft tissue mass adjacent to mid right ureter 2.8 cm (previously was 2.5 cm) causing right hydronephrosis.  4 mm left upper lobe lung nodule previously was 2 mm and 3 mm right upper lobe nodule. Bone scan 12/16/2019: Osseous metastatic disease throughout the thoracic spine, L1 and sternum  Radiology review: I discussed with the patient that there is continued slow progression of metastatic disease in spite of adding Faslodex.  Recommendation: 1.  Urology follow-up 2.  Scans for restaging 3.  Based on the scans radiation oncology consultation (Dr. Sondra Come)  Return to clinic after scans to discuss results

## 2020-03-25 ENCOUNTER — Telehealth: Payer: Self-pay | Admitting: Hematology and Oncology

## 2020-03-25 NOTE — Telephone Encounter (Signed)
Scheduled per 04/20 los, spoke with patient's son and patient will be notified.

## 2020-04-03 ENCOUNTER — Encounter (HOSPITAL_COMMUNITY): Payer: Self-pay

## 2020-04-03 ENCOUNTER — Ambulatory Visit (HOSPITAL_COMMUNITY)
Admission: RE | Admit: 2020-04-03 | Discharge: 2020-04-03 | Disposition: A | Discharge status: Home / Self Care | Payer: Medicare HMO | Source: Ambulatory Visit | Attending: Hematology and Oncology | Admitting: Hematology and Oncology

## 2020-04-03 ENCOUNTER — Other Ambulatory Visit: Payer: Self-pay

## 2020-04-03 ENCOUNTER — Encounter (HOSPITAL_COMMUNITY)
Admission: RE | Admit: 2020-04-03 | Discharge: 2020-04-03 | Disposition: A | Discharge status: Home / Self Care | Payer: Medicare HMO | Source: Ambulatory Visit | Attending: Hematology and Oncology | Admitting: Hematology and Oncology

## 2020-04-03 DIAGNOSIS — Z170 Estrogen receptor positive status [ER+]: Secondary | ICD-10-CM | POA: Insufficient documentation

## 2020-04-03 DIAGNOSIS — C50919 Malignant neoplasm of unspecified site of unspecified female breast: Secondary | ICD-10-CM | POA: Diagnosis not present

## 2020-04-03 DIAGNOSIS — C50511 Malignant neoplasm of lower-outer quadrant of right female breast: Secondary | ICD-10-CM

## 2020-04-03 DIAGNOSIS — C7951 Secondary malignant neoplasm of bone: Secondary | ICD-10-CM | POA: Diagnosis not present

## 2020-04-03 MED ORDER — SODIUM CHLORIDE (PF) 0.9 % IJ SOLN
INTRAMUSCULAR | Status: AC
  Filled 2020-04-03: qty 50

## 2020-04-03 MED ORDER — IOHEXOL 300 MG/ML  SOLN
75.0000 mL | Freq: Once | INTRAMUSCULAR | Status: AC | PRN
  Administered 2020-04-03: 75 mL via INTRAVENOUS

## 2020-04-03 MED ORDER — TECHNETIUM TC 99M MEDRONATE IV KIT
19.0000 | PACK | Freq: Once | INTRAVENOUS | Status: AC
  Administered 2020-04-03: 19 via INTRAVENOUS

## 2020-04-06 MED FILL — IBRANCE 75 MG TABS: 75 | 28 days supply | Qty: 14 | Fill #2

## 2020-04-08 NOTE — Progress Notes (Signed)
Patient Care Team: [name], MD as PCP - General (Internal Medicine) [name], MD as Consulting Physician (Hematology and Oncology)  DIAGNOSIS:    ICD-10-CM   1. Metastatic breast cancer ([place])  C50.919   2. Malignant neoplasm of lower-outer quadrant of right breast of female, estrogen receptor positive ([place])  C50.511    Z17.0     SUMMARY OF ONCOLOGIC HISTORY: Oncology History  Breast cancer of lower-outer quadrant of right female breast ([place])  01/04/1995 Surgery   Right breast cancer status post lumpectomy followed by chemotherapy followed by radiation and tamoxifen 5 years (details are not available)   12/30/2015 Relapse/Recurrence   Chest wall/ soft tissue masses near the right side of the sternum, superior nodule measuring 14.5 mm, inferior nodule measuring 10.5 mm, 26 mm anterior mediastinal soft tissue mass, T9 bone metastases   12/30/2015 Procedure   Thoracentesis: 400 mL removed; showed reactive mesothelial cells, no cancer cells   12/30/2015 Imaging   CT chest: 26 x 17 mm anterior mediastinal soft tissue mass, mixed lytic and sclerotic bone mets mainly involving sternum and T9 vertebral body, scattered vertebral body involvement and rib involvement; chest wall soft tissue masses 14.5 mm, 10.5 mm   01/13/2016 Initial Biopsy   Right chest wall mass biopsy 2:30 position: Invasive ductal carcinoma focally involves skeletal muscle, grade 2, HER-2 negative ratio 1.08, ER/PR 95% positive   01/14/2016 PET scan   Widespread metastatic disease right breast, right chest wall, mediastinum, right internal mammary lymph nodes, right pleural space, malignant pleural effusion, right common iliac lymph node, bone metastases in the thorax   01/21/2016 -  Anti-estrogen oral therapy   Ibrance with letrozole   04/15/2017 - 04/23/2017 Hospital Admission   Pneumonia and confusion     CHIEF COMPLIANT: Follow-up of metastatic breast cancer on Ibrance withFaslodex  INTERVAL HISTORY: Jessica Zavala is a 74 y.o. with above-mentioned history of metastatic breast cancercurrently on treatment withIbrance and Faslodex. CT CAP on 04/03/20 showed an enlarging soft tissue mass in the right retroperitoneum, slight increase in size of pulmonary nodules, and slight increase in lucency of osseous metastases. Bone scan on 04/03/20 showed similar appearance to prior exams, with no new osseous metastases. She presents to the clinic today to review her scans.   ALLERGIES:  is allergic to aspirin.  MEDICATIONS:  Current Outpatient Medications  Medication Sig Dispense Refill  . atorvastatin (LIPITOR) 20 MG tablet Take 1 tablet (20 mg total) by mouth daily. 30 tablet 11  . clobetasol cream (TEMOVATE) 1.24 % Apply 1 application topically 2 (two) times daily as needed. 30 g 0  . clotrimazole-betamethasone (LOTRISONE) cream Apply 1 application topically 2 (two) times daily as needed (skin irritation). 45 g 0  . furosemide (LASIX) 20 MG tablet Take 1 tablet (20 mg total) by mouth daily. 30 tablet 0  . IBRANCE 75 MG tablet TAKE 1 TABLET (75 MG TOTAL) BY MOUTH DAILY. TAKE FOR 14 DAYS ON, 14 DAYS OFF, REPEAT EVERY 28 DAYS. 14 tablet 6  . labetalol (NORMODYNE) 200 MG tablet Take 1 tablet (200 mg total) by mouth 2 (two) times daily. 30 tablet 1  . letrozole (FEMARA) 2.5 MG tablet Take 1 tablet (2.5 mg total) by mouth daily. 90 tablet 3   No current facility-administered medications for this visit.    PHYSICAL EXAMINATION: ECOG PERFORMANCE STATUS: 1 - Symptomatic but completely ambulatory  There were no vitals filed for this visit. There were no vitals filed for this visit.  LABORATORY  DATA:  I have reviewed the data as listed CMP Latest Ref Rng & Units 03/24/2020 12/02/2019 11/07/2019  Glucose 70 - 99 mg/dL 83 98 104(H)  BUN 8 - 23 mg/dL 21 27(H) 22  Creatinine 0.44 - 1.00 mg/dL 1.16(H) 0.99 0.99  Sodium 135 - 145 mmol/L 136 137 138  Potassium 3.5 - 5.1 mmol/L 3.6 4.2 4.8  Chloride 98 - 111 mmol/L  97(L) 97(L) 101  CO2 22 - 32 mmol/L '30 29 30  '$ Calcium 8.9 - 10.3 mg/dL 9.1 9.3 8.6(L)  Total Protein 6.5 - 8.1 g/dL 7.8 7.8 7.3  Total Bilirubin 0.3 - 1.2 mg/dL 0.4 0.3 0.4  Alkaline Phos 38 - 126 U/L 62 51 51  AST 15 - 41 U/L '25 31 25  '$ ALT 0 - 44 U/L '12 16 14    '$ Lab Results  Component Value Date   WBC 2.2 (L) 03/24/2020   HGB 10.9 (L) 03/24/2020   HCT 31.7 (L) 03/24/2020   MCV 97.5 03/24/2020   PLT 249 03/24/2020   NEUTROABS 0.9 (L) 03/24/2020    ASSESSMENT & PLAN:  Breast cancer of lower-outer quadrant of right female breast ([place]) Metastatic breast cancer PET-CT 01/14/16: Widespread metastatic disease right breast, right chest wall several nodules largest 3.7 cm, mediastinum, right internal mammary lymph node 2.2 cm, right pleural-based nodules 2.1cm and 2.7 cm, malignant pleural effusion, right common iliac lymph node, bone metastases in the thorax (sternum, manubrium, multiple thoracic vertebral bodies, multiple right-sided ribs)  Right chest wall mass biopsy 2:30 position 01/13/2016: Invasive ductal carcinoma focally involves skeletal muscle, grade 2, HER-2 negative ratio 1.08, ER/PR pending (Right breast cancer 1996 status post lumpectomy followed by chemotherapy followed by radiation and tamoxifen 5 years (details are not available)) ---------------------------------------------------------------------------------------------------------------------------------------------------------- Treatment plan: Ibrance with letrozole started 01/20/2016 , cycle 3 Ibrance 100 mg, Cycle 4 Ibrance 75 mg dose; switch to 75 mg 3 weeks on 2 weeks offfrom 07/22/2016, changed to 2 weeks on 2 weeks off12/12/2015.Faslodex added 08/29/2019  Bone scan 04/03/2020: Similar bone scan evidence of multifocal metastatic disease compared to prior exam.  Mostly involving thoracic upper lumbar spine and sternum. CT CAP 04/03/2020: Enlarging soft tissue mass right retroperitoneum with worsening right ureteral  obstruction.  Mass measures 3.3 cm previously was 2.6 cm, pulmonary nodules very minimal change.  Extensive bone metastases.  Plan: Urology follow-up on May 10 I sent a message to Dr. Bess Harvest to see if she can undergo stent placement. I will refer her to see Dr.Kinard with radiation oncology to talk about the retroperitoneal mass which is the primary area of progression. We will also discussed with interventional radiology to get a biopsy of the retroperitoneal mass so that we can send molecular testing.  We would like to get Caris molecular testing to see if she has any other treatment options.  I will see her back in June along with her injection appointment to discuss the results and come up with a treatment plan.  No orders of the defined types were placed in this encounter.  The patient has a good understanding of the overall plan. she agrees with it. she will call with any problems that may develop before the next visit here.  Total time spent: 30 mins including face to face time and time spent for planning, charting and coordination of care  [name], MD 04/09/2020  I, Cloyde Reams Dors[place], am acting as scribe for Dr. [name].  I have reviewed the above documentation for accuracy and completeness, and I agree  with the above.

## 2020-04-09 ENCOUNTER — Other Ambulatory Visit: Payer: Self-pay

## 2020-04-09 ENCOUNTER — Inpatient Hospital Stay: Payer: Medicare HMO | Attending: Hematology and Oncology | Admitting: Hematology and Oncology

## 2020-04-09 VITALS — BP 107/66 | HR 70 | Temp 98.2°F | Resp 16 | Ht <= 58 in | Wt 112.3 lb

## 2020-04-09 DIAGNOSIS — Z5111 Encounter for antineoplastic chemotherapy: Secondary | ICD-10-CM | POA: Diagnosis not present

## 2020-04-09 DIAGNOSIS — C50919 Malignant neoplasm of unspecified site of unspecified female breast: Secondary | ICD-10-CM

## 2020-04-09 DIAGNOSIS — Z79899 Other long term (current) drug therapy: Secondary | ICD-10-CM | POA: Insufficient documentation

## 2020-04-09 DIAGNOSIS — Z79811 Long term (current) use of aromatase inhibitors: Secondary | ICD-10-CM | POA: Diagnosis not present

## 2020-04-09 DIAGNOSIS — C50511 Malignant neoplasm of lower-outer quadrant of right female breast: Secondary | ICD-10-CM | POA: Diagnosis not present

## 2020-04-09 DIAGNOSIS — C7951 Secondary malignant neoplasm of bone: Secondary | ICD-10-CM | POA: Insufficient documentation

## 2020-04-09 DIAGNOSIS — Z170 Estrogen receptor positive status [ER+]: Secondary | ICD-10-CM | POA: Insufficient documentation

## 2020-04-09 NOTE — Assessment & Plan Note (Signed)
Metastatic breast cancer PET-CT 01/14/16: Widespread metastatic disease right breast, right chest wall several nodules largest 3.7 cm, mediastinum, right internal mammary lymph node 2.2 cm, right pleural-based nodules 2.1cm and 2.7 cm, malignant pleural effusion, right common iliac lymph node, bone metastases in the thorax (sternum, manubrium, multiple thoracic vertebral bodies, multiple right-sided ribs)  Right chest wall mass biopsy 2:30 position 01/13/2016: Invasive ductal carcinoma focally involves skeletal muscle, grade 2, HER-2 negative ratio 1.08, ER/PR pending (Right breast cancer 1996 status post lumpectomy followed by chemotherapy followed by radiation and tamoxifen 5 years (details are not available)) ---------------------------------------------------------------------------------------------------------------------------------------------------------- Treatment plan: Ibrance with letrozole started 01/20/2016 , cycle 3 Ibrance 100 mg, Cycle 4 Ibrance 75 mg dose; switch to 75 mg 3 weeks on 2 weeks offfrom 07/22/2016, changed to 2 weeks on 2 weeks off12/12/2015.Faslodex added 08/29/2019  Bone scan 04/03/2020: Similar bone scan evidence of multifocal metastatic disease compared to prior exam.  Mostly involving thoracic upper lumbar spine and sternum. CT CAP 04/03/2020: Enlarging soft tissue mass right retroperitoneum with worsening right ureteral obstruction.  Mass measures 3.3 cm previously was 2.6 cm, pulmonary nodules very minimal change.  Extensive bone metastases.  Plan: Urology follow-up on May 10 I will refer her to see Dr.Kinard with radiation oncology to talk about the retroperitoneal mass which is the primary area of progression. We would like to get Caris molecular testing to see if she has any other treatment options.

## 2020-04-10 ENCOUNTER — Telehealth: Payer: Self-pay | Admitting: Hematology and Oncology

## 2020-04-10 NOTE — Telephone Encounter (Signed)
Scheduled per 05/06 los, patient has been called and notified. 

## 2020-04-13 DIAGNOSIS — N130 Hydronephrosis with ureteropelvic junction obstruction: Secondary | ICD-10-CM | POA: Diagnosis not present

## 2020-04-13 DIAGNOSIS — C778 Secondary and unspecified malignant neoplasm of lymph nodes of multiple regions: Secondary | ICD-10-CM | POA: Diagnosis not present

## 2020-04-13 DIAGNOSIS — C7951 Secondary malignant neoplasm of bone: Secondary | ICD-10-CM | POA: Diagnosis not present

## 2020-04-22 ENCOUNTER — Inpatient Hospital Stay: Payer: Medicare HMO

## 2020-04-22 ENCOUNTER — Other Ambulatory Visit: Payer: Self-pay

## 2020-04-22 VITALS — BP 136/62 | HR 65 | Temp 98.6°F | Resp 18

## 2020-04-22 DIAGNOSIS — C50511 Malignant neoplasm of lower-outer quadrant of right female breast: Secondary | ICD-10-CM | POA: Diagnosis not present

## 2020-04-22 DIAGNOSIS — C7951 Secondary malignant neoplasm of bone: Secondary | ICD-10-CM | POA: Diagnosis not present

## 2020-04-22 DIAGNOSIS — Z5111 Encounter for antineoplastic chemotherapy: Secondary | ICD-10-CM | POA: Diagnosis not present

## 2020-04-22 DIAGNOSIS — Z170 Estrogen receptor positive status [ER+]: Secondary | ICD-10-CM | POA: Diagnosis not present

## 2020-04-22 DIAGNOSIS — Z79899 Other long term (current) drug therapy: Secondary | ICD-10-CM | POA: Diagnosis not present

## 2020-04-22 DIAGNOSIS — Z79811 Long term (current) use of aromatase inhibitors: Secondary | ICD-10-CM | POA: Diagnosis not present

## 2020-04-22 MED ORDER — FULVESTRANT 250 MG/5ML IM SOLN
500.0000 mg | Freq: Once | INTRAMUSCULAR | Status: AC
  Administered 2020-04-22: 500 mg via INTRAMUSCULAR

## 2020-04-22 NOTE — Patient Instructions (Signed)
Fulvestrant injection What is this medicine? FULVESTRANT (ful VES trant) blocks the effects of estrogen. It is used to treat breast cancer. This medicine may be used for other purposes; ask your health care provider or pharmacist if you have questions. COMMON BRAND NAME(S): FASLODEX What should I tell my health care provider before I take this medicine? They need to know if you have any of these conditions:  bleeding disorders  liver disease  low blood counts, like low white cell, platelet, or red cell counts  an unusual or allergic reaction to fulvestrant, other medicines, foods, dyes, or preservatives  pregnant or trying to get pregnant  breast-feeding How should I use this medicine? This medicine is for injection into a muscle. It is usually given by a health care professional in a hospital or clinic setting. Talk to your pediatrician regarding the use of this medicine in children. Special care may be needed. Overdosage: If you think you have taken too much of this medicine contact a poison control center or emergency room at once. NOTE: This medicine is only for you. Do not share this medicine with others. What if I miss a dose? It is important not to miss your dose. Call your doctor or health care professional if you are unable to keep an appointment. What may interact with this medicine?  medicines that treat or prevent blood clots like warfarin, enoxaparin, dalteparin, apixaban, dabigatran, and rivaroxaban This list may not describe all possible interactions. Give your health care provider a list of all the medicines, herbs, non-prescription drugs, or dietary supplements you use. Also tell them if you smoke, drink alcohol, or use illegal drugs. Some items may interact with your medicine. What should I watch for while using this medicine? Your condition will be monitored carefully while you are receiving this medicine. You will need important blood work done while you are taking  this medicine. Do not become pregnant while taking this medicine or for at least 1 year after stopping it. Women of child-bearing potential will need to have a negative pregnancy test before starting this medicine. Women should inform their doctor if they wish to become pregnant or think they might be pregnant. There is a potential for serious side effects to an unborn child. Men should inform their doctors if they wish to father a child. This medicine may lower sperm counts. Talk to your health care professional or pharmacist for more information. Do not breast-feed an infant while taking this medicine or for 1 year after the last dose. What side effects may I notice from receiving this medicine? Side effects that you should report to your doctor or health care professional as soon as possible:  allergic reactions like skin rash, itching or hives, swelling of the face, lips, or tongue  feeling faint or lightheaded, falls  pain, tingling, numbness, or weakness in the legs  signs and symptoms of infection like fever or chills; cough; flu-like symptoms; sore throat  vaginal bleeding Side effects that usually do not require medical attention (report to your doctor or health care professional if they continue or are bothersome):  aches, pains  constipation  diarrhea  headache  hot flashes  nausea, vomiting  pain at site where injected  stomach pain This list may not describe all possible side effects. Call your doctor for medical advice about side effects. You may report side effects to FDA at 1-800-FDA-1088. Where should I keep my medicine? This drug is given in a hospital or clinic and will   not be stored at home. NOTE: This sheet is a summary. It may not cover all possible information. If you have questions about this medicine, talk to your doctor, pharmacist, or health care provider.  2020 Elsevier/Gold Standard (2018-03-01 11:34:41)  

## 2020-04-29 MED FILL — IBRANCE 75 MG TABS: 75 | 28 days supply | Qty: 14 | Fill #3

## 2020-05-06 ENCOUNTER — Telehealth: Payer: Self-pay | Admitting: Hematology and Oncology

## 2020-05-06 NOTE — Telephone Encounter (Signed)
R/s 6/16 appt due to VG out of office - called son - no answer. Left message for patient with new appt date and time

## 2020-05-20 ENCOUNTER — Ambulatory Visit: Payer: Medicare HMO | Admitting: Hematology and Oncology

## 2020-05-20 ENCOUNTER — Ambulatory Visit: Payer: Medicare HMO

## 2020-05-26 NOTE — Progress Notes (Signed)
Patient Care Team: [name], MD as PCP - General (Internal Medicine) [name], MD as Consulting Physician (Hematology and Oncology)  DIAGNOSIS: No diagnosis found.  SUMMARY OF ONCOLOGIC HISTORY: Oncology History  Breast cancer of lower-outer quadrant of right female breast ([place])  01/04/1995 Surgery   Right breast cancer status post lumpectomy followed by chemotherapy followed by radiation and tamoxifen 5 years (details are not available)   12/30/2015 Relapse/Recurrence   Chest wall/ soft tissue masses near the right side of the sternum, superior nodule measuring 14.5 mm, inferior nodule measuring 10.5 mm, 26 mm anterior mediastinal soft tissue mass, T9 bone metastases   12/30/2015 Procedure   Thoracentesis: 400 mL removed; showed reactive mesothelial cells, no cancer cells   12/30/2015 Imaging   CT chest: 26 x 17 mm anterior mediastinal soft tissue mass, mixed lytic and sclerotic bone mets mainly involving sternum and T9 vertebral body, scattered vertebral body involvement and rib involvement; chest wall soft tissue masses 14.5 mm, 10.5 mm   01/13/2016 Initial Biopsy   Right chest wall mass biopsy 2:30 position: Invasive ductal carcinoma focally involves skeletal muscle, grade 2, HER-2 negative ratio 1.08, ER/PR 95% positive   01/14/2016 PET scan   Widespread metastatic disease right breast, right chest wall, mediastinum, right internal mammary lymph nodes, right pleural space, malignant pleural effusion, right common iliac lymph node, bone metastases in the thorax   01/21/2016 -  Anti-estrogen oral therapy   Ibrance with letrozole   04/15/2017 - 04/23/2017 Hospital Admission   Pneumonia and confusion     CHIEF COMPLIANT: Follow-up of metastatic breast cancer on Ibrance withFaslodex  INTERVAL HISTORY: Jessica Zavala is a 74 y.o. with above-mentioned history of metastatic breast cancercurrently on treatment withIbrance and Faslodex. She presents to the clinic today to review  her CARIS molecular testing results and discuss alternative treatment options.  She continues to complain of left upper quadrant abdominal discomfort which is mild in nature.  She has an appointment to see radiation oncology to talk about abdominal radiation for the pelvic tumor.  ALLERGIES:  is allergic to aspirin.  MEDICATIONS:  Current Outpatient Medications  Medication Sig Dispense Refill  . atorvastatin (LIPITOR) 20 MG tablet Take 1 tablet (20 mg total) by mouth daily. 30 tablet 11  . clobetasol cream (TEMOVATE) 5.95 % Apply 1 application topically 2 (two) times daily as needed. 30 g 0  . clotrimazole-betamethasone (LOTRISONE) cream Apply 1 application topically 2 (two) times daily as needed (skin irritation). 45 g 0  . furosemide (LASIX) 20 MG tablet Take 1 tablet (20 mg total) by mouth daily. 30 tablet 0  . IBRANCE 75 MG tablet TAKE 1 TABLET (75 MG TOTAL) BY MOUTH DAILY. TAKE FOR 14 DAYS ON, 14 DAYS OFF, REPEAT EVERY 28 DAYS. 14 tablet 6  . labetalol (NORMODYNE) 200 MG tablet Take 1 tablet (200 mg total) by mouth 2 (two) times daily. 30 tablet 1  . letrozole (FEMARA) 2.5 MG tablet Take 1 tablet (2.5 mg total) by mouth daily. 90 tablet 3   No current facility-administered medications for this visit.    PHYSICAL EXAMINATION: ECOG PERFORMANCE STATUS: 1 - Symptomatic but completely ambulatory  Vitals:   05/27/20 1449  BP: (!) 156/73  Pulse: 72  Resp: 17  Temp: 98.2 F (36.8 C)  SpO2: 98%   Filed Weights   05/27/20 1449  Weight: 113 lb 3.2 oz (51.3 kg)    LABORATORY DATA:  I have reviewed the data as listed CMP Latest Ref Rng &  Units 03/24/2020 12/02/2019 11/07/2019  Glucose 70 - 99 mg/dL 83 98 104(H)  BUN 8 - 23 mg/dL 21 27(H) 22  Creatinine 0.44 - 1.00 mg/dL 1.16(H) 0.99 0.99  Sodium 135 - 145 mmol/L 136 137 138  Potassium 3.5 - 5.1 mmol/L 3.6 4.2 4.8  Chloride 98 - 111 mmol/L 97(L) 97(L) 101  CO2 22 - 32 mmol/L '30 29 30  ' Calcium 8.9 - 10.3 mg/dL 9.1 9.3 8.6(L)  Total  Protein 6.5 - 8.1 g/dL 7.8 7.8 7.3  Total Bilirubin 0.3 - 1.2 mg/dL 0.4 0.3 0.4  Alkaline Phos 38 - 126 U/L 62 51 51  AST 15 - 41 U/L '25 31 25  ' ALT 0 - 44 U/L '12 16 14    ' Lab Results  Component Value Date   WBC 2.2 (L) 03/24/2020   HGB 10.9 (L) 03/24/2020   HCT 31.7 (L) 03/24/2020   MCV 97.5 03/24/2020   PLT 249 03/24/2020   NEUTROABS 0.9 (L) 03/24/2020    ASSESSMENT & PLAN:  Breast cancer of lower-outer quadrant of right female breast ([place]) Metastatic breast cancer PET-CT 01/14/16: Widespread metastatic disease right breast, right chest wall several nodules largest 3.7 cm, mediastinum, right internal mammary lymph node 2.2 cm, right pleural-based nodules 2.1cm and 2.7 cm, malignant pleural effusion, right common iliac lymph node, bone metastases in the thorax (sternum, manubrium, multiple thoracic vertebral bodies, multiple right-sided ribs)  Right chest wall mass biopsy 2:30 position 01/13/2016: Invasive ductal carcinoma focally involves skeletal muscle, grade 2, HER-2 negative ratio 1.08, ER/PR pending (Right breast cancer 1996 status post lumpectomy followed by chemotherapy followed by radiation and tamoxifen 5 years (details are not available)) ---------------------------------------------------------------------------------------------------------------------------------------------------------- Treatment plan: Ibrance with letrozole started 01/20/2016 , cycle 3 Ibrance 100 mg, Cycle 4 Ibrance 75 mg dose; switch to 75 mg 3 weeks on 2 weeks offfrom 07/22/2016, changed to 2 weeks on 2 weeks off12/12/2015.Faslodex added 08/29/2019  Bone scan 04/03/2020: Similar bone scan evidence of multifocal metastatic disease compared to prior exam.  Mostly involving thoracic upper lumbar spine and sternum. CT CAP 04/03/2020: Enlarging soft tissue mass right retroperitoneum with worsening right ureteral obstruction.  Mass measures 3.3 cm previously was 2.6 cm, pulmonary nodules very minimal  change.  Extensive bone metastases.  Patient has an appointment with radiation oncology on 06/03/2020 Caris molecular testing She will continue with her monthly injections.  I instructed her to stop Ibrance during radiation. Return to clinic monthly for injections and I will see her in 2 months.    No orders of the defined types were placed in this encounter.  The patient has a good understanding of the overall plan. she agrees with it. she will call with any problems that may develop before the next visit here.  Total time spent: 30 mins including face to face time and time spent for planning, charting and coordination of care  [name], MD 05/27/2020  I, Cloyde Reams Dorshimer, am acting as scribe for Dr. [name].  I have reviewed the above documentation for accuracy and completeness, and I agree with the above.

## 2020-05-27 ENCOUNTER — Inpatient Hospital Stay: Payer: Medicare HMO

## 2020-05-27 ENCOUNTER — Other Ambulatory Visit: Payer: Self-pay

## 2020-05-27 ENCOUNTER — Encounter: Payer: Self-pay | Admitting: *Deleted

## 2020-05-27 ENCOUNTER — Inpatient Hospital Stay: Payer: Medicare HMO | Attending: Hematology and Oncology | Admitting: Hematology and Oncology

## 2020-05-27 ENCOUNTER — Telehealth: Payer: Self-pay | Admitting: Hematology and Oncology

## 2020-05-27 VITALS — BP 156/73 | HR 72 | Temp 98.2°F | Resp 17 | Ht <= 58 in | Wt 113.2 lb

## 2020-05-27 DIAGNOSIS — Z170 Estrogen receptor positive status [ER+]: Secondary | ICD-10-CM

## 2020-05-27 DIAGNOSIS — Z79899 Other long term (current) drug therapy: Secondary | ICD-10-CM | POA: Diagnosis not present

## 2020-05-27 DIAGNOSIS — C50919 Malignant neoplasm of unspecified site of unspecified female breast: Secondary | ICD-10-CM

## 2020-05-27 DIAGNOSIS — Z5111 Encounter for antineoplastic chemotherapy: Secondary | ICD-10-CM | POA: Diagnosis not present

## 2020-05-27 DIAGNOSIS — Z79811 Long term (current) use of aromatase inhibitors: Secondary | ICD-10-CM | POA: Diagnosis not present

## 2020-05-27 DIAGNOSIS — C7951 Secondary malignant neoplasm of bone: Secondary | ICD-10-CM | POA: Diagnosis not present

## 2020-05-27 DIAGNOSIS — C50511 Malignant neoplasm of lower-outer quadrant of right female breast: Secondary | ICD-10-CM | POA: Diagnosis not present

## 2020-05-27 DIAGNOSIS — J910 Malignant pleural effusion: Secondary | ICD-10-CM | POA: Insufficient documentation

## 2020-05-27 MED ORDER — FULVESTRANT 250 MG/5ML IM SOLN
INTRAMUSCULAR | Status: AC
  Filled 2020-05-27: qty 5

## 2020-05-27 MED ORDER — FULVESTRANT 250 MG/5ML IM SOLN
500.0000 mg | Freq: Once | INTRAMUSCULAR | Status: AC
  Administered 2020-05-27: 500 mg via INTRAMUSCULAR

## 2020-05-27 NOTE — Assessment & Plan Note (Signed)
Metastatic breast cancer PET-CT 01/14/16: Widespread metastatic disease right breast, right chest wall several nodules largest 3.7 cm, mediastinum, right internal mammary lymph node 2.2 cm, right pleural-based nodules 2.1cm and 2.7 cm, malignant pleural effusion, right common iliac lymph node, bone metastases in the thorax (sternum, manubrium, multiple thoracic vertebral bodies, multiple right-sided ribs)  Right chest wall mass biopsy 2:30 position 01/13/2016: Invasive ductal carcinoma focally involves skeletal muscle, grade 2, HER-2 negative ratio 1.08, ER/PR pending (Right breast cancer 1996 status post lumpectomy followed by chemotherapy followed by radiation and tamoxifen 5 years (details are not available)) ---------------------------------------------------------------------------------------------------------------------------------------------------------- Treatment plan: Ibrance with letrozole started 01/20/2016 , cycle 3 Ibrance 100 mg, Cycle 4 Ibrance 75 mg dose; switch to 75 mg 3 weeks on 2 weeks offfrom 07/22/2016, changed to 2 weeks on 2 weeks off12/12/2015.Faslodex added 08/29/2019  Bone scan 04/03/2020: Similar bone scan evidence of multifocal metastatic disease compared to prior exam.  Mostly involving thoracic upper lumbar spine and sternum. CT CAP 04/03/2020: Enlarging soft tissue mass right retroperitoneum with worsening right ureteral obstruction.  Mass measures 3.3 cm previously was 2.6 cm, pulmonary nodules very minimal change.  Extensive bone metastases.  Patient has an appointment with radiation oncology on 06/03/2020 [facility] molecular testing

## 2020-05-27 NOTE — Patient Instructions (Signed)
Fulvestrant injection What is this medicine? FULVESTRANT (ful VES trant) blocks the effects of estrogen. It is used to treat breast cancer. This medicine may be used for other purposes; ask your health care provider or pharmacist if you have questions. COMMON BRAND NAME(S): FASLODEX What should I tell my health care provider before I take this medicine? They need to know if you have any of these conditions:  bleeding disorders  liver disease  low blood counts, like low white cell, platelet, or red cell counts  an unusual or allergic reaction to fulvestrant, other medicines, foods, dyes, or preservatives  pregnant or trying to get pregnant  breast-feeding How should I use this medicine? This medicine is for injection into a muscle. It is usually given by a health care professional in a hospital or clinic setting. Talk to your pediatrician regarding the use of this medicine in children. Special care may be needed. Overdosage: If you think you have taken too much of this medicine contact a poison control center or emergency room at once. NOTE: This medicine is only for you. Do not share this medicine with others. What if I miss a dose? It is important not to miss your dose. Call your doctor or health care professional if you are unable to keep an appointment. What may interact with this medicine?  medicines that treat or prevent blood clots like warfarin, enoxaparin, dalteparin, apixaban, dabigatran, and rivaroxaban This list may not describe all possible interactions. Give your health care provider a list of all the medicines, herbs, non-prescription drugs, or dietary supplements you use. Also tell them if you smoke, drink alcohol, or use illegal drugs. Some items may interact with your medicine. What should I watch for while using this medicine? Your condition will be monitored carefully while you are receiving this medicine. You will need important blood work done while you are taking  this medicine. Do not become pregnant while taking this medicine or for at least 1 year after stopping it. Women of child-bearing potential will need to have a negative pregnancy test before starting this medicine. Women should inform their doctor if they wish to become pregnant or think they might be pregnant. There is a potential for serious side effects to an unborn child. Men should inform their doctors if they wish to father a child. This medicine may lower sperm counts. Talk to your health care professional or pharmacist for more information. Do not breast-feed an infant while taking this medicine or for 1 year after the last dose. What side effects may I notice from receiving this medicine? Side effects that you should report to your doctor or health care professional as soon as possible:  allergic reactions like skin rash, itching or hives, swelling of the face, lips, or tongue  feeling faint or lightheaded, falls  pain, tingling, numbness, or weakness in the legs  signs and symptoms of infection like fever or chills; cough; flu-like symptoms; sore throat  vaginal bleeding Side effects that usually do not require medical attention (report to your doctor or health care professional if they continue or are bothersome):  aches, pains  constipation  diarrhea  headache  hot flashes  nausea, vomiting  pain at site where injected  stomach pain This list may not describe all possible side effects. Call your doctor for medical advice about side effects. You may report side effects to FDA at 1-800-FDA-1088. Where should I keep my medicine? This drug is given in a hospital or clinic and will   not be stored at home. NOTE: This sheet is a summary. It may not cover all possible information. If you have questions about this medicine, talk to your doctor, pharmacist, or health care provider.  2020 Elsevier/Gold Standard (2018-03-01 11:34:41)  

## 2020-05-27 NOTE — Progress Notes (Signed)
Per MD request, RN successfully faxed [facility] requisition to [phone].

## 2020-05-27 NOTE — Telephone Encounter (Signed)
Scheduled per 6/23 los. Pt requested earlier times on 7/21 and inj during treatment that day. Printed avs and calendar for pt.

## 2020-05-28 MED FILL — IBRANCE 75 MG TABS: 75 | 28 days supply | Qty: 14 | Fill #4

## 2020-05-28 NOTE — Progress Notes (Signed)
Patient here for a new consult with Dr. Sondra Come.   05/27/2020 [facility]   Jessica Lose, MD Hematology and Oncology  Metastatic breast cancer [facility]) +2 more Dx  Referred by [name], MD Reason for Visit  Additional Documentation  Vitals:  BP 156/73Important (BP Location: Left Arm, Patient Position: Sitting)  Pulse 72  Temp 98.2 F (36.8 C) (Temporal)  Resp 17  Ht '4\' 9"'  (1.448 m)  Wt 113 lb 3.2 oz (51.3 kg)  SpO2 98%  BMI 24.50 kg/m  BSA 1.44 m  Flowsheets:  Amb Complex Vitals Nav,  Vital Signs,  NEWS,  MEWS Score,  Anthropometrics,  Method of Visit,  Collaborative Documentation    Encounter Info:  Billing Info,  History,  Allergies,  Detailed Report    Orthostatic Vitals Recorded in This Encounter   05/27/2020  1449     Patient Position: Sitting  BP Location: Left Arm  All Notes  Progress Notes by Jessica Lose, MD at 05/27/2020 3:15 PM Author: [name], MD Author Type: Physician Filed: 05/27/2020 4:37 PM  Note Status: Signed Cosign: Cosign Not Required Encounter Date: 05/27/2020  Editor: Jessica Lose, MD (Physician)      Prior Versions: 1. Dorshimer, Molly at 05/26/2020 9:52 PM - Incomplete    Expand AllCollapse All     Patient Care Team: [name], MD as PCP - General (Internal Medicine) Jessica Lose, MD as Consulting Physician (Hematology and Oncology)  DIAGNOSIS: No diagnosis found.  SUMMARY OF ONCOLOGIC HISTORY:    Oncology History  Breast cancer of lower-outer quadrant of right female breast ([place])  01/04/1995 Surgery   Right breast cancer status post lumpectomy followed by chemotherapy followed by radiation and tamoxifen 5 years (details are not available)   12/30/2015 Relapse/Recurrence   Chest wall/ soft tissue masses near the right side of the sternum, superior nodule measuring 14.5 mm, inferior nodule measuring 10.5 mm, 26 mm anterior mediastinal soft tissue mass, T9 bone metastases     12/30/2015 Procedure   Thoracentesis: 400 mL removed; showed reactive mesothelial cells, no cancer cells   12/30/2015 Imaging   CT chest: 26 x 17 mm anterior mediastinal soft tissue mass, mixed lytic and sclerotic bone mets mainly involving sternum and T9 vertebral body, scattered vertebral body involvement and rib involvement; chest wall soft tissue masses 14.5 mm, 10.5 mm   01/13/2016 Initial Biopsy   Right chest wall mass biopsy 2:30 position: Invasive ductal carcinoma focally involves skeletal muscle, grade 2, HER-2 negative ratio 1.08, ER/PR 95% positive   01/14/2016 PET scan   Widespread metastatic disease right breast, right chest wall, mediastinum, right internal mammary lymph nodes, right pleural space, malignant pleural effusion, right common iliac lymph node, bone metastases in the thorax   01/21/2016 -  Anti-estrogen oral therapy   Ibrance with letrozole   04/15/2017 - 04/23/2017 Hospital Admission   Pneumonia and confusion     CHIEF COMPLIANT: Follow-up of metastatic breast cancer on Ibrance withFaslodex  INTERVAL HISTORY: Jessica Zavala is a 74 y.o. with above-mentioned history of metastatic breast cancercurrently on treatment withIbrance and Faslodex.She presents to the clinic todayto review her CARIS molecular testing results and discuss alternative treatment options.  She continues to complain of left upper quadrant abdominal discomfort which is mild in nature.  She has an appointment to see radiation oncology to talk about abdominal radiation for the pelvic tumor.  ALLERGIES:  is allergic to aspirin.  MEDICATIONS:        Current Outpatient  Medications  Medication Sig Dispense Refill  . atorvastatin (LIPITOR) 20 MG tablet Take 1 tablet (20 mg total) by mouth daily. 30 tablet 11  . clobetasol cream (TEMOVATE) 6.75 % Apply 1 application topically 2 (two) times daily as needed. 30 g 0  . clotrimazole-betamethasone (LOTRISONE) cream Apply 1 application  topically 2 (two) times daily as needed (skin irritation). 45 g 0  . furosemide (LASIX) 20 MG tablet Take 1 tablet (20 mg total) by mouth daily. 30 tablet 0  . IBRANCE 75 MG tablet TAKE 1 TABLET (75 MG TOTAL) BY MOUTH DAILY. TAKE FOR 14 DAYS ON, 14 DAYS OFF, REPEAT EVERY 28 DAYS. 14 tablet 6  . labetalol (NORMODYNE) 200 MG tablet Take 1 tablet (200 mg total) by mouth 2 (two) times daily. 30 tablet 1  . letrozole (FEMARA) 2.5 MG tablet Take 1 tablet (2.5 mg total) by mouth daily. 90 tablet 3   No current facility-administered medications for this visit.    PHYSICAL EXAMINATION: ECOG PERFORMANCE STATUS: 1 - Symptomatic but completely ambulatory  Vitals:   05/27/20 1449  BP: (!) 156/73  Pulse: 72  Resp: 17  Temp: 98.2 F (36.8 C)  SpO2: 98%      Filed Weights   05/27/20 1449  Weight: 113 lb 3.2 oz (51.3 kg)    LABORATORY DATA:  I have reviewed the data as listed CMP Latest Ref Rng & Units 03/24/2020 12/02/2019 11/07/2019  Glucose 70 - 99 mg/dL 83 98 104(H)  BUN 8 - 23 mg/dL 21 27(H) 22  Creatinine 0.44 - 1.00 mg/dL 1.16(H) 0.99 0.99  Sodium 135 - 145 mmol/L 136 137 138  Potassium 3.5 - 5.1 mmol/L 3.6 4.2 4.8  Chloride 98 - 111 mmol/L 97(L) 97(L) 101  CO2 22 - 32 mmol/L '30 29 30  ' Calcium 8.9 - 10.3 mg/dL 9.1 9.3 8.6(L)  Total Protein 6.5 - 8.1 g/dL 7.8 7.8 7.3  Total Bilirubin 0.3 - 1.2 mg/dL 0.4 0.3 0.4  Alkaline Phos 38 - 126 U/L 62 51 51  AST 15 - 41 U/L '25 31 25  ' ALT 0 - 44 U/L '12 16 14    ' Recent Labs       Lab Results  Component Value Date   WBC 2.2 (L) 03/24/2020   HGB 10.9 (L) 03/24/2020   HCT 31.7 (L) 03/24/2020   MCV 97.5 03/24/2020   PLT 249 03/24/2020   NEUTROABS 0.9 (L) 03/24/2020      ASSESSMENT & PLAN:  Breast cancer of lower-outer quadrant of right female breast ([place]) Metastatic breast cancer PET-CT 01/14/16: Widespread metastatic disease right breast, right chest wall several nodules largest 3.7 cm, mediastinum, right internal  mammary lymph node 2.2 cm, right pleural-based nodules 2.1cm and 2.7 cm, malignant pleural effusion, right common iliac lymph node, bone metastases in the thorax (sternum, manubrium, multiple thoracic vertebral bodies, multiple right-sided ribs)  Right chest wall mass biopsy 2:30 position 01/13/2016: Invasive ductal carcinoma focally involves skeletal muscle, grade 2, HER-2 negative ratio 1.08, ER/PR pending (Right breast cancer 1996 status post lumpectomy followed by chemotherapy followed by radiation and tamoxifen 5 years (details are not available)) ---------------------------------------------------------------------------------------------------------------------------------------------------------- Treatment plan: Ibrance with letrozole started 01/20/2016 , cycle 3 Ibrance 100 mg, Cycle 4 Ibrance 75 mg dose; switch to 75 mg 3 weeks on 2 weeks offfrom 07/22/2016, changed to 2 weeks on 2 weeks off12/12/2015.Faslodex added 08/29/2019  Bone scan 04/03/2020: Similar bone scan evidence of multifocal metastatic disease compared to prior exam. Mostly involving thoracic upper lumbar spine and  sternum. CT CAP 04/03/2020: Enlarging soft tissue mass right retroperitoneum with worsening right ureteral obstruction. Mass measures 3.3 cm previously was 2.6 cm, pulmonary nodules very minimal change. Extensive bone metastases.  Patient has an appointment with radiation oncology on 06/03/2020 Caris molecular testing She will continue with her monthly injections.  I instructed her to stop Ibrance during radiation. Return to clinic monthly for injections and I will see her in 2 months.    No orders of the defined types were placed in this encounter.  The patient has a good understanding of the overall plan. she agrees with it. she will call with any problems that may develop before the next visit here.  Total time spent: 30 mins including face to face time and time spent for planning, charting and  coordination of care  Jessica Lose, MD 05/27/2020  I, Cloyde Reams Dorshimer, am acting as scribe for Dr. Nicholas Zavala.  I have reviewed the above documentation for accuracy and completeness, and I agree with the above.          Assessment & Plan Note by Jessica Lose, MD at 05/27/2020 7:34 AM Author: [name], MD Author Type: Physician Filed: 05/27/2020 7:39 AM  Note Status: Written Cosign: Cosign Not Required Encounter Date: 05/27/2020  Problem: Breast cancer of lower-outer quadrant of right female breast [facility])  Editor: Jessica Lose, MD (Physician)                 Metastatic breast cancer PET-CT 01/14/16: Widespread metastatic disease right breast, right chest wall several nodules largest 3.7 cm, mediastinum, right internal mammary lymph node 2.2 cm, right pleural-based nodules 2.1cm and 2.7 cm, malignant pleural effusion, right common iliac lymph node, bone metastases in the thorax (sternum, manubrium, multiple thoracic vertebral bodies, multiple right-sided ribs)  Right chest wall mass biopsy 2:30 position 01/13/2016: Invasive ductal carcinoma focally involves skeletal muscle, grade 2, HER-2 negative ratio 1.08, ER/PR pending (Right breast cancer 1996 status post lumpectomy followed by chemotherapy followed by radiation and tamoxifen 5 years (details are not available)) ---------------------------------------------------------------------------------------------------------------------------------------------------------- Treatment plan: Ibrance with letrozole started 01/20/2016 , cycle 3 Ibrance 100 mg, Cycle 4 Ibrance 75 mg dose; switch to 75 mg 3 weeks on 2 weeks offfrom 07/22/2016, changed to 2 weeks on 2 weeks off12/12/2015.Faslodex added 08/29/2019  Bone scan 04/03/2020: Similar bone scan evidence of multifocal metastatic disease compared to prior exam. Mostly involving thoracic upper lumbar spine and sternum. CT CAP 04/03/2020: Enlarging soft tissue mass right  retroperitoneum with worsening right ureteral obstruction. Mass measures 3.3 cm previously was 2.6 cm, pulmonary nodules very minimal change. Extensive bone metastases.  Patient has an appointment with radiation oncology on 06/03/2020 Caris molecular testing      Instructions   After Visit Summary (Printed 05/27/2020) Communications    [facility] Provider CC Chart Rep sent to [name], MD Media From this encounter Electronic signature on 05/27/2020 2:37 PM - E-signed  Electronic signature on 05/27/2020 2:37 PM - 1 of 3 e-signatures recorded  Communication Routing History  Recipient Method Sent by Date Sent  [name], MD Fax Jessica Lose, MD 05/27/2020  Fax: [phone]  Phone: [phone]   Conversation: Appointment Rescheduled (Oldest Message First) May 06, 2020 Mychart, Generic to [place], [place] T "Sound Like "True""   GM   2:59 PM Appointment Information:     Visit Type: Established Patient 15         Date: 05/27/2020  Dept: [facility]                 Provider: Harriette Ohara                 Time: 3:15 PM                 Length: 15 min   Appt Status: Scheduled      Original Appointment Information:     Visit Type: Established Patient 15         Date: 05/20/2020                 Dept: [facility]                 Provider: Harriette Ohara                 Time: 8:30 AM                 Length: 15 min       Cancel Reason: Provider  This MyChart message has not been read. May 26, 2020 Jessica Lose, MD     9:52 PM Note Expand AllCollapse All     Patient Care Team: [name], MD as PCP - General (Internal Medicine) Jessica Lose, MD as Consulting Physician (Hematology and Oncology)  DIAGNOSIS: No diagnosis found.  SUMMARY OF ONCOLOGIC HISTORY:    Oncology History  Breast cancer of lower-outer quadrant of right female breast ([place])  01/04/1995 Surgery   Right breast  cancer status post lumpectomy followed by chemotherapy followed by radiation and tamoxifen 5 years (details are not available)   12/30/2015 Relapse/Recurrence   Chest wall/ soft tissue masses near the right side of the sternum, superior nodule measuring 14.5 mm, inferior nodule measuring 10.5 mm, 26 mm anterior mediastinal soft tissue mass, T9 bone metastases   12/30/2015 Procedure   Thoracentesis: 400 mL removed; showed reactive mesothelial cells, no cancer cells   12/30/2015 Imaging   CT chest: 26 x 17 mm anterior mediastinal soft tissue mass, mixed lytic and sclerotic bone mets mainly involving sternum and T9 vertebral body, scattered vertebral body involvement and rib involvement; chest wall soft tissue masses 14.5 mm, 10.5 mm   01/13/2016 Initial Biopsy   Right chest wall mass biopsy 2:30 position: Invasive ductal carcinoma focally involves skeletal muscle, grade 2, HER-2 negative ratio 1.08, ER/PR 95% positive   01/14/2016 PET scan   Widespread metastatic disease right breast, right chest wall, mediastinum, right internal mammary lymph nodes, right pleural space, malignant pleural effusion, right common iliac lymph node, bone metastases in the thorax   01/21/2016 -  Anti-estrogen oral therapy   Ibrance with letrozole   04/15/2017 - 04/23/2017 Hospital Admission   Pneumonia and confusion     CHIEF COMPLIANT: Follow-up of metastatic breast cancer on Ibrance withFaslodex  INTERVAL HISTORY: Jessica Zavala is a 74 y.o. with above-mentioned history of metastatic breast cancercurrently on treatment withIbrance and Faslodex.She presents to the clinic todayto review her CARIS molecular testing results and discuss alternative treatment options.  She continues to complain of left upper quadrant abdominal discomfort which is mild in nature.  She has an appointment to see radiation oncology to talk about abdominal radiation for the pelvic tumor.  ALLERGIES:  is allergic to  aspirin.  MEDICATIONS:        Current Outpatient Medications  Medication Sig Dispense Refill  . atorvastatin (LIPITOR) 20 MG tablet Take  1 tablet (20 mg total) by mouth daily. 30 tablet 11  . clobetasol cream (TEMOVATE) 0.10 % Apply 1 application topically 2 (two) times daily as needed. 30 g 0  . clotrimazole-betamethasone (LOTRISONE) cream Apply 1 application topically 2 (two) times daily as needed (skin irritation). 45 g 0  . furosemide (LASIX) 20 MG tablet Take 1 tablet (20 mg total) by mouth daily. 30 tablet 0  . IBRANCE 75 MG tablet TAKE 1 TABLET (75 MG TOTAL) BY MOUTH DAILY. TAKE FOR 14 DAYS ON, 14 DAYS OFF, REPEAT EVERY 28 DAYS. 14 tablet 6  . labetalol (NORMODYNE) 200 MG tablet Take 1 tablet (200 mg total) by mouth 2 (two) times daily. 30 tablet 1  . letrozole (FEMARA) 2.5 MG tablet Take 1 tablet (2.5 mg total) by mouth daily. 90 tablet 3   No current facility-administered medications for this visit.    PHYSICAL EXAMINATION: ECOG PERFORMANCE STATUS: 1 - Symptomatic but completely ambulatory     Vitals:   05/27/20 1449  BP: (!) 156/73  Pulse: 72  Resp: 17  Temp: 98.2 F (36.8 C)  SpO2: 98%      Filed Weights   05/27/20 1449  Weight: 113 lb 3.2 oz (51.3 kg)    LABORATORY DATA:  I have reviewed the data as listed CMP Latest Ref Rng & Units 03/24/2020 12/02/2019 11/07/2019  Glucose 70 - 99 mg/dL 83 98 104(H)  BUN 8 - 23 mg/dL 21 27(H) 22  Creatinine 0.44 - 1.00 mg/dL 1.16(H) 0.99 0.99  Sodium 135 - 145 mmol/L 136 137 138  Potassium 3.5 - 5.1 mmol/L 3.6 4.2 4.8  Chloride 98 - 111 mmol/L 97(L) 97(L) 101  CO2 22 - 32 mmol/L '30 29 30  ' Calcium 8.9 - 10.3 mg/dL 9.1 9.3 8.6(L)  Total Protein 6.5 - 8.1 g/dL 7.8 7.8 7.3  Total Bilirubin 0.3 - 1.2 mg/dL 0.4 0.3 0.4  Alkaline Phos 38 - 126 U/L 62 51 51  AST 15 - 41 U/L '25 31 25  ' ALT 0 - 44 U/L '12 16 14    ' Recent Labs       Lab Results  Component Value Date   WBC 2.2 (L) 03/24/2020   HGB 10.9 (L)  03/24/2020   HCT 31.7 (L) 03/24/2020   MCV 97.5 03/24/2020   PLT 249 03/24/2020   NEUTROABS 0.9 (L) 03/24/2020      ASSESSMENT & PLAN:  Breast cancer of lower-outer quadrant of right female breast ([place]) Metastatic breast cancer PET-CT 01/14/16: Widespread metastatic disease right breast, right chest wall several nodules largest 3.7 cm, mediastinum, right internal mammary lymph node 2.2 cm, right pleural-based nodules 2.1cm and 2.7 cm, malignant pleural effusion, right common iliac lymph node, bone metastases in the thorax (sternum, manubrium, multiple thoracic vertebral bodies, multiple right-sided ribs)  Right chest wall mass biopsy 2:30 position 01/13/2016: Invasive ductal carcinoma focally involves skeletal muscle, grade 2, HER-2 negative ratio 1.08, ER/PR pending (Right breast cancer 1996 status post lumpectomy followed by chemotherapy followed by radiation and tamoxifen 5 years (details are not available)) ---------------------------------------------------------------------------------------------------------------------------------------------------------- Treatment plan: Ibrance with letrozole started 01/20/2016 , cycle 3 Ibrance 100 mg, Cycle 4 Ibrance 75 mg dose; switch to 75 mg 3 weeks on 2 weeks offfrom 07/22/2016, changed to 2 weeks on 2 weeks off12/12/2015.Faslodex added 08/29/2019  Bone scan 04/03/2020: Similar bone scan evidence of multifocal metastatic disease compared to prior exam. Mostly involving thoracic upper lumbar spine and sternum. CT CAP 04/03/2020: Enlarging soft tissue mass right retroperitoneum with worsening  right ureteral obstruction. Mass measures 3.3 cm previously was 2.6 cm, pulmonary nodules very minimal change. Extensive bone metastases.  Patient has an appointment with radiation oncology on 06/03/2020 Caris molecular testing She will continue with her monthly injections.  I instructed her to stop Ibrance during radiation. Return to clinic  monthly for injections and I will see her in 2 months.    No orders of the defined types were placed in this encounter.  The patient has a good understanding of the overall plan. she agrees with it. she will call with any problems that may develop before the next visit here.  Total time spent: 30 mins including face to face time and time spent for planning, charting and coordination of care  Jessica Lose, MD 05/27/2020  I, Cloyde Reams Dorshimer, am acting as scribe for Dr. Nicholas Zavala.  I have reviewed the above documentation for accuracy and completeness, and I agree with the above.          May 27, 2020 Jessica Lose, MD     7:39 AM Note   Metastatic breast cancer PET-CT 01/14/16: Widespread metastatic disease right breast, right chest wall several nodules largest 3.7 cm, mediastinum, right internal mammary lymph node 2.2 cm, right pleural-based nodules 2.1cm and 2.7 cm, malignant pleural effusion, right common iliac lymph node, bone metastases in the thorax (sternum, manubrium, multiple thoracic vertebral bodies, multiple right-sided ribs)  Right chest wall mass biopsy 2:30 position 01/13/2016: Invasive ductal carcinoma focally involves skeletal muscle, grade 2, HER-2 negative ratio 1.08, ER/PR pending (Right breast cancer 1996 status post lumpectomy followed by chemotherapy followed by radiation and tamoxifen 5 years (details are not available)) ---------------------------------------------------------------------------------------------------------------------------------------------------------- Treatment plan: Ibrance with letrozole started 01/20/2016 , cycle 3 Ibrance 100 mg, Cycle 4 Ibrance 75 mg dose; switch to 75 mg 3 weeks on 2 weeks offfrom 07/22/2016, changed to 2 weeks on 2 weeks off12/12/2015.Faslodex added 08/29/2019  Bone scan 04/03/2020: Similar bone scan evidence of multifocal metastatic disease compared to prior exam. Mostly involving thoracic  upper lumbar spine and sternum. CT CAP 04/03/2020: Enlarging soft tissue mass right retroperitoneum with worsening right ureteral obstruction. Mass measures 3.3 cm previously was 2.6 cm, pulmonary nodules very minimal change. Extensive bone metastases.  Patient has an appointment with radiation oncology on 06/03/2020 Caris molecular testing        Past/Anticipated interventions by surgeon, if any: no  Past/Anticipated interventions by medical oncology, if any: Chemotherapy yes  Lymphedema issues, if any: no  Pain issues, if any: Has daily intermittent pain in her left abdomen  SAFETY ISSUES:  Prior radiation? yes  Pacemaker/ICD? no  Possible current pregnancy? postmenopausal  Is the patient on methotrexate? no  Current Complaints / other details: Patient reports bein anxious about this visit. [place] interpreter  [place] present. Pt's husband present also.  BP (!) 176/87 (BP Location: Right Arm, Patient Position: Sitting, Cuff Size: Normal)   Pulse 76   Temp (!) 97.3 F (36.3 C)   Resp 20   Ht '4\' 9"'  (1.448 m)   Wt 114 lb (51.7 kg)   SpO2 97%   BMI 24.67 kg/m   Wt Readings from Last 3 Encounters:  06/03/20 114 lb (51.7 kg)  05/27/20 113 lb 3.2 oz (51.3 kg)  04/09/20 112 lb 4.8 oz (50.9 kg)      De Burrs, RN 05/28/2020,1:47 PM

## 2020-06-03 ENCOUNTER — Ambulatory Visit
Admission: RE | Admit: 2020-06-03 | Discharge: 2020-06-03 | Disposition: A | Discharge status: Home / Self Care | Payer: Medicare HMO | Source: Ambulatory Visit | Attending: Radiation Oncology | Admitting: Radiation Oncology

## 2020-06-03 ENCOUNTER — Encounter: Payer: Self-pay | Admitting: Radiation Oncology

## 2020-06-03 ENCOUNTER — Other Ambulatory Visit: Payer: Self-pay

## 2020-06-03 VITALS — BP 176/87 | HR 76 | Temp 97.3°F | Resp 20 | Ht <= 58 in | Wt 114.0 lb

## 2020-06-03 DIAGNOSIS — Z79899 Other long term (current) drug therapy: Secondary | ICD-10-CM | POA: Diagnosis not present

## 2020-06-03 DIAGNOSIS — I10 Essential (primary) hypertension: Secondary | ICD-10-CM | POA: Diagnosis not present

## 2020-06-03 DIAGNOSIS — C50919 Malignant neoplasm of unspecified site of unspecified female breast: Secondary | ICD-10-CM

## 2020-06-03 DIAGNOSIS — E785 Hyperlipidemia, unspecified: Secondary | ICD-10-CM | POA: Diagnosis not present

## 2020-06-03 DIAGNOSIS — K689 Other disorders of retroperitoneum: Secondary | ICD-10-CM | POA: Diagnosis not present

## 2020-06-03 DIAGNOSIS — Z79811 Long term (current) use of aromatase inhibitors: Secondary | ICD-10-CM | POA: Diagnosis not present

## 2020-06-03 DIAGNOSIS — E871 Hypo-osmolality and hyponatremia: Secondary | ICD-10-CM | POA: Diagnosis not present

## 2020-06-03 DIAGNOSIS — C50411 Malignant neoplasm of upper-outer quadrant of right female breast: Secondary | ICD-10-CM | POA: Diagnosis not present

## 2020-06-03 DIAGNOSIS — E7800 Pure hypercholesterolemia, unspecified: Secondary | ICD-10-CM | POA: Diagnosis not present

## 2020-06-03 DIAGNOSIS — C786 Secondary malignant neoplasm of retroperitoneum and peritoneum: Secondary | ICD-10-CM | POA: Insufficient documentation

## 2020-06-03 DIAGNOSIS — Z853 Personal history of malignant neoplasm of breast: Secondary | ICD-10-CM | POA: Diagnosis not present

## 2020-06-03 DIAGNOSIS — Z923 Personal history of irradiation: Secondary | ICD-10-CM | POA: Diagnosis not present

## 2020-06-03 DIAGNOSIS — C50511 Malignant neoplasm of lower-outer quadrant of right female breast: Secondary | ICD-10-CM

## 2020-06-03 DIAGNOSIS — N1330 Unspecified hydronephrosis: Secondary | ICD-10-CM | POA: Diagnosis not present

## 2020-06-03 DIAGNOSIS — C7951 Secondary malignant neoplasm of bone: Secondary | ICD-10-CM | POA: Insufficient documentation

## 2020-06-03 DIAGNOSIS — Z170 Estrogen receptor positive status [ER+]: Secondary | ICD-10-CM | POA: Insufficient documentation

## 2020-06-03 NOTE — Progress Notes (Signed)
Radiation Oncology         [phone] ________________________________  Initial Outpatient Consultation  Name: Jessica Zavala MRN: [id number]  Date: 06/03/2020  DOB: 04-22-1946  XT:KWIOX, [name], MD  [name], MD   REFERRING PHYSICIAN: [name], MD  DIAGNOSIS: The primary encounter diagnosis was Metastatic breast cancer [facility]). A diagnosis of Malignant neoplasm of lower-outer quadrant of right female breast, unspecified estrogen receptor status ([place]) was also pertinent to this visit.  Recurrent Stage IV, Right Breast UIQ, Invasive Ductal Carcinoma, ER+ / PR+ / Her2-, Grade 2, with bony metastases and retroperitoneal mass  HISTORY OF PRESENT ILLNESS::Jessica Zavala is a 74 y.o. female who is seen as a courtesy of Dr. Lindi Adie for an opinion concerning radiation therapy as part of management for her recurrent metastatic breast cancer with bony metastases and retroperitoneal mass. Today, she is accompanied by husband as well as interpreter. She has an extensive history beginning in January of 1996 when she was diagnosed with right breast cancer that was treated with lumpectomy followed by chemotherapy, radiation therapy, and Tamoxifen x5 years. Specific details are not available.  On 12/30/2015, CT scan of chest showed a 26 x 17 mm anterior mediastinal soft tissue mass that could be a tumor coming from the sternum or adenopathy. There was also noted to be right-sided chest wall recurrence (chest wall soft tissue masses measuring 14.5 mm and 10.5 mm) in addition to osseous metastatic disease mainly involving the sternum and T9 vertebral body, scattered vertebral body involvement, and rib involvement. Secondary to this, she underwent a thoracentesis on that same day with 400 mL removed that showed reactive mesothelial cells but no cancer cells.  Given the above results, the patient was seen in consultation with Dr. Lindi Adie on 01/05/2016. At that time, it was recommended that she proceed with biopsy  of the chest wall recurrence and PET scan.  Biopsy of the right breast and chest wall soft tissue mass on 01/13/2016 showed grade 2 invasive ductal carcinoma focally involving skeletal muscle. Prognostic indicators significant were for estrogen receptor, 95% positive and progesterone receptor, 95% positive, both with strong staining intensities. HER2 negative.  PET scan on 01/14/2016 showed widespread metastatic disease involving the medial aspect of the right breast, right chest wall, mediastinum (most notable for right internal mammary lymph nodes), right pleural space (including a malignant right pleural effusion), an isolated lymph node in the right common iliac nodal chain, and numerous osseous lesions in the thorax.  The patient began anti-estrogen oral therapy with Ibrance and Letrozole on 01/20/2016 under the care of Dr. Lindi Adie. She did experience some neutropenia, which resulted in holding the Donald prior to cycle #2 in March of 2017. This was resumed at a lower dose on 04/01/2016. She was also started on Xgeva on 02/18/2016 for the bone metastases. Following treatment, CT of chest/abdomen/pelvis on 04/22/2016 showed an excellent response to therapy with improvement in lymph nodes as well as bone metastases and no further pleural effusion. Barbaraann Rondo was held once again in August of 2017 secondary to [place] less than 1, which was resumed the following month.  CT of chest/abdomen/pelvis on 11/03/2016 showed a stable appearance of the right internal mammary lymph node without new or progressive findings in the chest. There were multiple lytic and sclerotic lesions in the thoracic spine and ribs that had not substantially changed in the interval. Finally, the patient's cardiomegaly appeared stable with scattered bilateral ground-glass attenuation that was possibly related to edema.  Of note, the patient  was hospitalized for pneumonia and confusion from 04/15/2017 - 04/23/2017 but had completely  recovered. CT of chest/abdomen/pelvis on 06/09/2017 showed similar right internal mammary soft tissue fullness that was likely represented nodal metastasis without any new sites of soft tissue metastasis. However, there was a new small right pleural effusion.  The patient was seen in the ED on 03/10/2018 for a rash and was diagnosed with herpes zoster. For that reason, Leslee Home was held. Once improved, the patient resumed Ibrance on 04/06/2018.   Bone scans and CT scans remained stable as the patient continued treatment. This was until 05/13/2019 when the patient underwent a chest/abdomen/pelvis CT scan that showed a 2.2 cm soft tissue implant that was obstructing the right ureter and was suspicious for progressive metastasis. There was some associated moderate right hydroureteronephrosis. The soft tissue in the superior mediastinal region and medial right chest wall was stable, favoring treated tumor. Addionally, there was multifocal osseous metastases in the visualized axial and appendicular skeleton, particularly involving the sternum and thoracic spine, that was grossly unchanged. Finally, the small right pleural effusion had mildly increased. Bone scan on that same day showed progressive osseous metastatic disease involving the thoracic spine, multiple ribs, and manubrium. It also showed new right hydronephrosis and hydroureter, as discussed in the CT results.  Bone scan on 08/19/2019 did not show any change in metastatic disease when compared to the prior bone scan. Metastatic uptake was noted in the thoracic spine and adjacent ribs as well as the sternal manubrium. The right hydronephrosis had improved. CT of chest/abdomen/pelvis on 08/20/2019 showed mild progression of metastatic disease with mildly increased periureteric soft tissue metastasis in the right mid ureter with stable mild right hydroureteronephrosis. The was noted to be widespread patchy sclerotic osseous metastases in the axial skeletal,  increased in size in several of the thoracic and lumbar vertebral lesions. The asymmetric right internal mammary soft tissue was stable as well as the small right lung base pulmonary nodules and the small dependent right pleural effusion.   The patient was seen by Dr. Lindi Adie on 08/22/2019, during which time they discussed different treatment options including a clinical trial of [place], Ibrance with Faslodex, and Exemestane and Everolimus. They opted to proceed with Ibrance and Faslodex that was started on 08/29/2019.  Despite adding Falodex, CT scan of chest/abdomen/pelvis on 12/16/2019 showed mildly worsened osseous metastatic disease. Although much of the pattern of osseous metastatic disease was similar, there was enlargement of the sclerotic metastatic lesions in T11-L1. There was also enlargement of the soft tissue mass adjacent to the mid right ureter that then measured 2.8 cm in long axis and was causing moderate to prominent right hydronephrosis right hydroureter. There was also noted to be a 4 mm left upper lobe pulmonary nodule that had previously measured 2 mm and merited surveillance. Additionally, there was a 3 mm right upper lobe peribronchial nodule that had increased from prior. Finally, there were small right and trace left pleural effusions, stable mild soft tissue thickening along the right internal mammary region (possibly related to previously treated adenopathy or the adjacent sternal metastatic disease), lumbar impingement at L3-4, L4-5, and L5-S1 due to spondylosis and degenerative disc disease, and a probably stable 5 mm subcapsular hypodense lesion in the left hepatic lobe. Bone scan on that same day showed findings consistent with osseous metastatic disease seen throughout the thoracic spine, L1, and the sternum that corresponded to blastic lesions on the CT scan.  The patient was seen by Dr. Lindi Adie on 12/23/2019,  who recommended repeat scans in three months and consultation with  radiation oncology regarding the retroperitoneal mass.  The patient's most recent CT scan of chest/abdomen/pelvis was on 04/03/2020 and showed an enlarging soft tissue mass in the right retroperitoneum with worsening right ureteral obstruction. There was also a slight increase in size of the pulmonary nodules, extensive sternal metastatic disease with lytic and sclerotic feature, slight increase in lucency of vertebral bodies and pedicle at the T9 and T10 levels with some associated increased soft tissue about the mid and lower thoracic vertebral bodies, further increase in size of sclerotic focus, and increased in lucency and in size of T12 metastasis. Finally, it showed left hepatic contours that raised the question of liver disease with some subtle venous collaterals in the upper abdomen. The other areas of disease in the chest, abdomen, and pelvis were similar in appearance. Bone scan on that same day showed a similar scintigraphic appearance of multifocal metastatic disease most prominently involving the thoracic and upper lumbar spine as well as the sternum. There was also some multifocal degenerative-type uptake that symmetrically involved the joints. There was no evidence of new osseous metastasis.  PREVIOUS RADIATION THERAPY: Yes, in 1996 when initially diagnosed with right breast cancer. Specific details are unavailable.  PAST MEDICAL HISTORY:  Past Medical History:  Diagnosis Date   Allergy    Eczema of hand 11/15/2013   HCAP (healthcare-associated pneumonia)    High cholesterol    Hyperlipidemia    Hypertension    Hyponatremia    Metastatic breast cancer ([place]) dx'd 1996/12/2015   Right side, sp lumpectomy, rxt and chemo   Pneumonia 04/15/2017    PAST SURGICAL HISTORY: Past Surgical History:  Procedure Laterality Date   BREAST SURGERY      FAMILY HISTORY: History reviewed. No pertinent family history.  SOCIAL HISTORY:  Social History   Tobacco Use   Smoking  status: Never Smoker   Smokeless tobacco: Never Used  Substance Use Topics   Alcohol use: No    Alcohol/week: 0.0 standard drinks   Drug use: No    ALLERGIES:  Allergies  Allergen Reactions   Aspirin Swelling    Mouth and facial swelling    MEDICATIONS:  Current Outpatient Medications  Medication Sig Dispense Refill   acetaminophen (TYLENOL) 500 MG tablet Take 500 mg by mouth every 6 (six) hours as needed.     clobetasol cream (TEMOVATE) 1.09 % Apply 1 application topically 2 (two) times daily as needed. 30 g 0   clotrimazole-betamethasone (LOTRISONE) cream Apply 1 application topically 2 (two) times daily as needed (skin irritation). 45 g 0   IBRANCE 75 MG tablet TAKE 1 TABLET (75 MG TOTAL) BY MOUTH DAILY. TAKE FOR 14 DAYS ON, 14 DAYS OFF, REPEAT EVERY 28 DAYS. 14 tablet 6   labetalol (NORMODYNE) 200 MG tablet Take 1 tablet (200 mg total) by mouth 2 (two) times daily. 30 tablet 1   letrozole (FEMARA) 2.5 MG tablet Take 1 tablet (2.5 mg total) by mouth daily. 90 tablet 3   atorvastatin (LIPITOR) 20 MG tablet Take 1 tablet (20 mg total) by mouth daily. 30 tablet 11   furosemide (LASIX) 20 MG tablet Take 1 tablet (20 mg total) by mouth daily. (Patient not taking: Reported on 06/03/2020) 30 tablet 0   No current facility-administered medications for this encounter.    REVIEW OF SYSTEMS:  A 10+ POINT REVIEW OF SYSTEMS WAS OBTAINED including neurology, dermatology, psychiatry, cardiac, respiratory, lymph, extremities, GI, GU, musculoskeletal, constitutional, reproductive,  HEENT.  She denies any abdominal pain or flank pain.  She denies any new bony pain headaches dizziness or blurred vision.   PHYSICAL EXAM:  height is '4\' 9"'  (1.448 m) and weight is 114 lb (51.7 kg). Her temperature is 97.3 F (36.3 C) (abnormal). Her blood pressure is 176/87 (abnormal) and her pulse is 76. Her respiration is 20 and oxygen saturation is 97%.   General: Alert and oriented, in no acute  distress HEENT: Head is normocephalic. Extraocular movements are intact.  Neck: Neck is supple, no palpable cervical or supraclavicular lymphadenopathy. Heart: Regular in rate and rhythm with no murmurs, rubs, or gallops. Chest: Clear to auscultation bilaterally, with no rhonchi, wheezes, or rales. Abdomen: Soft, nontender, nondistended, with no rigidity or guarding. Extremities: No cyanosis or edema. Lymphatics: see Neck Exam Skin: No concerning lesions. Musculoskeletal: symmetric strength and muscle tone throughout. Neurologic: Cranial nerves II through XII are grossly intact. No obvious focalities. Speech is fluent. Coordination is intact. Psychiatric: Judgment and insight are intact. Affect is appropriate.    ECOG = 1  0 - Asymptomatic (Fully active, able to carry on all predisease activities without restriction)  1 - Symptomatic but completely ambulatory (Restricted in physically strenuous activity but ambulatory and able to carry out work of a light or sedentary nature. For example, light housework, office work)  2 - Symptomatic, <50% in bed during the day (Ambulatory and capable of all self care but unable to carry out any work activities. Up and about more than 50% of waking hours)  3 - Symptomatic, >50% in bed, but not bedbound (Capable of only limited self-care, confined to bed or chair 50% or more of waking hours)  4 - Bedbound (Completely disabled. Cannot carry on any self-care. Totally confined to bed or chair)  5 - Death   Eustace Pen MM, Creech RH, Tormey DC, et al. [id number]). "Toxicity and response criteria of the [facility]". [place] Oncol. 5 (6): 649-55  LABORATORY DATA:  Lab Results  Component Value Date   WBC 2.2 (L) 03/24/2020   HGB 10.9 (L) 03/24/2020   HCT 31.7 (L) 03/24/2020   MCV 97.5 03/24/2020   PLT 249 03/24/2020   NEUTROABS 0.9 (L) 03/24/2020   Lab Results  Component Value Date   NA 136 03/24/2020   K 3.6 03/24/2020   CL 97  (L) 03/24/2020   CO2 30 03/24/2020   GLUCOSE 83 03/24/2020   CREATININE 1.16 (H) 03/24/2020   CALCIUM 9.1 03/24/2020      RADIOGRAPHY: No results found.    IMPRESSION: Recurrent Stage IV, Right Breast UIQ, Invasive Ductal Carcinoma, ER+ / PR+ / Her2-, Grade 2, with bony metastases and retroperitoneal mass  The patient's retroperitoneal mass is encompassing the right ureter and causing right hydronephrosis.  Renal function thus far has remained stable with mild elevation of creatinine to 1 6 on most recent labs in April.  Patient will be a good candidate for short course of palliative radiation therapy directed to describe retroperitoneal mass and attempt to treat this area and to improve the patient's right hydronephrosis.  Today, I talked to the patient and family about the findings and work-up thus far.  We discussed the natural history of breast cancer and general treatment, highlighting the role of palliative radiotherapy in the management.  We discussed the available radiation techniques, and focused on the details of logistics and delivery.  We reviewed the anticipated acute and late sequelae associated with radiation in this setting.  The patient was encouraged to ask questions that I answered to the best of my ability.  A patient consent form was discussed and signed.  We retained a copy for our records.  The patient would like to proceed with radiation and will be scheduled for CT simulation.  PLAN: The patient will proceed with CT simulation on July 6 with treatments to begin proximately a week later.  Anticipate approximately 15-20 treatments directed at the right retroperitoneal mass.  Total time spent in this encounter was 60 minutes which included reviewing the patient's medical history, consults, follow-ups, treatments, CT scans, bone scans, biopsy, physical examination, and documentation.    ------------------------------------------------  Blair Promise, PhD, MD  This  document serves as a record of services personally performed by Gery Pray, MD. It was created on his behalf by Clerance Lav, a trained medical scribe. The creation of this record is based on the scribe's personal observations and the provider's statements to them. This document has been checked and approved by the attending provider.

## 2020-06-05 NOTE — Progress Notes (Signed)
Radiation Oncology         [phone] ________________________________  Name: GRADIE BUTRICK MRN: [id number]  Date: 06/09/2020  DOB: 1946/02/22  SIMULATION AND TREATMENT PLANNING NOTE    ICD-10-CM   1. Metastatic breast cancer ([place])  C50.919     DIAGNOSIS: Recurrent Stage IV, Right Breast UIQ, Invasive Ductal Carcinoma, ER+ / PR+ / Her2-, Grade 2, with bony metastases and retroperitoneal mass causing right hydronephrosis  NARRATIVE:  The patient was brought to the [facility].  Identity was confirmed.  All relevant records and images related to the planned course of therapy were reviewed.  The patient freely provided informed written consent to proceed with treatment after reviewing the details related to the planned course of therapy. The consent form was witnessed and verified by the simulation staff.  Then, the patient was set-up in a stable reproducible  supine position for radiation therapy.  CT images were obtained.  Surface markings were placed.  The CT images were loaded into the planning software.  Then the target and avoidance structures were contoured.  Treatment planning then occurred.  The radiation prescription was entered and confirmed.  Then, I designed and supervised the construction of a total of 6 medically necessary complex treatment devices.  I have requested : 3D Simulation  I have requested a DVH of the following structures: GTV, PTV, small bowel, kidneys.  I have ordered:dose calc.  PLAN:  The patient will receive 35 Gy in 14 fractions.  -----------------------------------  Blair Promise, PhD, MD  This document serves as a record of services personally performed by Gery Pray, MD. It was created on his behalf by Clerance Lav, a trained medical scribe. The creation of this record is based on the scribe's personal observations and the provider's statements to them. This document has been checked and approved by the attending provider.

## 2020-06-09 ENCOUNTER — Other Ambulatory Visit: Payer: Self-pay

## 2020-06-09 ENCOUNTER — Ambulatory Visit
Admission: RE | Admit: 2020-06-09 | Discharge: 2020-06-09 | Disposition: A | Discharge status: Home / Self Care | Payer: Medicare HMO | Source: Ambulatory Visit | Attending: Radiation Oncology | Admitting: Radiation Oncology

## 2020-06-09 DIAGNOSIS — Z510 Encounter for antineoplastic radiation therapy: Secondary | ICD-10-CM | POA: Insufficient documentation

## 2020-06-09 DIAGNOSIS — Z170 Estrogen receptor positive status [ER+]: Secondary | ICD-10-CM | POA: Insufficient documentation

## 2020-06-09 DIAGNOSIS — C772 Secondary and unspecified malignant neoplasm of intra-abdominal lymph nodes: Secondary | ICD-10-CM | POA: Diagnosis not present

## 2020-06-09 DIAGNOSIS — C50911 Malignant neoplasm of unspecified site of right female breast: Secondary | ICD-10-CM | POA: Insufficient documentation

## 2020-06-09 DIAGNOSIS — C50919 Malignant neoplasm of unspecified site of unspecified female breast: Secondary | ICD-10-CM

## 2020-06-10 DIAGNOSIS — Z510 Encounter for antineoplastic radiation therapy: Secondary | ICD-10-CM | POA: Diagnosis not present

## 2020-06-10 DIAGNOSIS — Z170 Estrogen receptor positive status [ER+]: Secondary | ICD-10-CM | POA: Diagnosis not present

## 2020-06-10 DIAGNOSIS — C772 Secondary and unspecified malignant neoplasm of intra-abdominal lymph nodes: Secondary | ICD-10-CM | POA: Diagnosis not present

## 2020-06-10 DIAGNOSIS — C50911 Malignant neoplasm of unspecified site of right female breast: Secondary | ICD-10-CM | POA: Diagnosis not present

## 2020-06-15 DIAGNOSIS — C781 Secondary malignant neoplasm of mediastinum: Secondary | ICD-10-CM | POA: Diagnosis not present

## 2020-06-15 DIAGNOSIS — C7951 Secondary malignant neoplasm of bone: Secondary | ICD-10-CM | POA: Diagnosis not present

## 2020-06-15 DIAGNOSIS — C7989 Secondary malignant neoplasm of other specified sites: Secondary | ICD-10-CM | POA: Diagnosis not present

## 2020-06-15 DIAGNOSIS — Z170 Estrogen receptor positive status [ER+]: Secondary | ICD-10-CM | POA: Diagnosis not present

## 2020-06-15 DIAGNOSIS — C778 Secondary and unspecified malignant neoplasm of lymph nodes of multiple regions: Secondary | ICD-10-CM | POA: Diagnosis not present

## 2020-06-15 DIAGNOSIS — C782 Secondary malignant neoplasm of pleura: Secondary | ICD-10-CM | POA: Diagnosis not present

## 2020-06-15 DIAGNOSIS — C50511 Malignant neoplasm of lower-outer quadrant of right female breast: Secondary | ICD-10-CM | POA: Diagnosis not present

## 2020-06-15 NOTE — Progress Notes (Signed)
°  Radiation Oncology         [phone] ________________________________  Name: TREASURE [facility] MRN: [id number]  Date: 06/16/2020  DOB: 06/24/1946  Simulation Verification Note    ICD-10-CM   1. Metastatic breast cancer ([place])  C50.919     NARRATIVE: The patient was brought to the treatment unit and placed in the planned treatment position. The clinical setup was verified. Then port films were obtained and uploaded to the radiation oncology medical record software.  The treatment beams were carefully compared against the planned radiation fields. The position location and shape of the radiation fields was reviewed. The targeted volume of tissue appears to be appropriately covered by the radiation beams. Organs at risk appear to be excluded as planned.  Based on my personal review, I approved the simulation verification. The patient's treatment will proceed as planned.  -----------------------------------  Blair Promise, PhD, MD  This document serves as a record of services personally performed by Gery Pray, MD. It was created on his behalf by Clerance Lav, a trained medical scribe. The creation of this record is based on the scribe's personal observations and the provider's statements to them. This document has been checked and approved by the attending provider.

## 2020-06-16 ENCOUNTER — Ambulatory Visit
Admission: RE | Admit: 2020-06-16 | Discharge: 2020-06-16 | Disposition: A | Discharge status: Home / Self Care | Payer: Medicare HMO | Source: Ambulatory Visit | Attending: Radiation Oncology | Admitting: Radiation Oncology

## 2020-06-16 ENCOUNTER — Other Ambulatory Visit: Payer: Self-pay

## 2020-06-16 DIAGNOSIS — Z510 Encounter for antineoplastic radiation therapy: Secondary | ICD-10-CM | POA: Diagnosis not present

## 2020-06-16 DIAGNOSIS — C50919 Malignant neoplasm of unspecified site of unspecified female breast: Secondary | ICD-10-CM

## 2020-06-16 DIAGNOSIS — C50911 Malignant neoplasm of unspecified site of right female breast: Secondary | ICD-10-CM | POA: Diagnosis not present

## 2020-06-16 DIAGNOSIS — Z170 Estrogen receptor positive status [ER+]: Secondary | ICD-10-CM | POA: Diagnosis not present

## 2020-06-16 DIAGNOSIS — C772 Secondary and unspecified malignant neoplasm of intra-abdominal lymph nodes: Secondary | ICD-10-CM | POA: Diagnosis not present

## 2020-06-16 NOTE — Progress Notes (Signed)
Pt here for patient teaching.  Pt given Radiation and You booklet and skin care instructions.  Reviewed areas of pertinence such as diarrhea, fatigue, nausea and vomiting, skin changes and urinary and bladder changes . Pt able to give teach back of to pat skin and have Imodium on hand,. Pt demonstrated understanding of information given and will contact nursing with any questions or concerns.     [email or web address]

## 2020-06-17 ENCOUNTER — Ambulatory Visit
Admission: RE | Admit: 2020-06-17 | Discharge: 2020-06-17 | Disposition: A | Discharge status: Home / Self Care | Payer: Medicare HMO | Source: Ambulatory Visit | Attending: Radiation Oncology | Admitting: Radiation Oncology

## 2020-06-17 ENCOUNTER — Other Ambulatory Visit: Payer: Self-pay

## 2020-06-17 DIAGNOSIS — Z170 Estrogen receptor positive status [ER+]: Secondary | ICD-10-CM | POA: Diagnosis not present

## 2020-06-17 DIAGNOSIS — C50911 Malignant neoplasm of unspecified site of right female breast: Secondary | ICD-10-CM | POA: Diagnosis not present

## 2020-06-17 DIAGNOSIS — C772 Secondary and unspecified malignant neoplasm of intra-abdominal lymph nodes: Secondary | ICD-10-CM | POA: Diagnosis not present

## 2020-06-17 DIAGNOSIS — Z510 Encounter for antineoplastic radiation therapy: Secondary | ICD-10-CM | POA: Diagnosis not present

## 2020-06-18 ENCOUNTER — Ambulatory Visit
Admission: RE | Admit: 2020-06-18 | Discharge: 2020-06-18 | Disposition: A | Discharge status: Home / Self Care | Payer: Medicare HMO | Source: Ambulatory Visit | Attending: Radiation Oncology | Admitting: Radiation Oncology

## 2020-06-18 ENCOUNTER — Other Ambulatory Visit: Payer: Self-pay

## 2020-06-18 DIAGNOSIS — Z510 Encounter for antineoplastic radiation therapy: Secondary | ICD-10-CM | POA: Diagnosis not present

## 2020-06-18 DIAGNOSIS — C50911 Malignant neoplasm of unspecified site of right female breast: Secondary | ICD-10-CM | POA: Diagnosis not present

## 2020-06-18 DIAGNOSIS — C772 Secondary and unspecified malignant neoplasm of intra-abdominal lymph nodes: Secondary | ICD-10-CM | POA: Diagnosis not present

## 2020-06-18 DIAGNOSIS — Z170 Estrogen receptor positive status [ER+]: Secondary | ICD-10-CM | POA: Diagnosis not present

## 2020-06-19 ENCOUNTER — Ambulatory Visit
Admission: RE | Admit: 2020-06-19 | Discharge: 2020-06-19 | Disposition: A | Discharge status: Home / Self Care | Payer: Medicare HMO | Source: Ambulatory Visit | Attending: Radiation Oncology | Admitting: Radiation Oncology

## 2020-06-19 DIAGNOSIS — C772 Secondary and unspecified malignant neoplasm of intra-abdominal lymph nodes: Secondary | ICD-10-CM | POA: Diagnosis not present

## 2020-06-19 DIAGNOSIS — C50911 Malignant neoplasm of unspecified site of right female breast: Secondary | ICD-10-CM | POA: Diagnosis not present

## 2020-06-19 DIAGNOSIS — Z510 Encounter for antineoplastic radiation therapy: Secondary | ICD-10-CM | POA: Diagnosis not present

## 2020-06-19 DIAGNOSIS — Z170 Estrogen receptor positive status [ER+]: Secondary | ICD-10-CM | POA: Diagnosis not present

## 2020-06-22 ENCOUNTER — Other Ambulatory Visit: Payer: Self-pay

## 2020-06-22 ENCOUNTER — Ambulatory Visit
Admission: RE | Admit: 2020-06-22 | Discharge: 2020-06-22 | Disposition: A | Discharge status: Home / Self Care | Payer: Medicare HMO | Source: Ambulatory Visit | Attending: Radiation Oncology | Admitting: Radiation Oncology

## 2020-06-22 DIAGNOSIS — C772 Secondary and unspecified malignant neoplasm of intra-abdominal lymph nodes: Secondary | ICD-10-CM | POA: Diagnosis not present

## 2020-06-22 DIAGNOSIS — Z170 Estrogen receptor positive status [ER+]: Secondary | ICD-10-CM | POA: Diagnosis not present

## 2020-06-22 DIAGNOSIS — Z510 Encounter for antineoplastic radiation therapy: Secondary | ICD-10-CM | POA: Diagnosis not present

## 2020-06-22 DIAGNOSIS — C50911 Malignant neoplasm of unspecified site of right female breast: Secondary | ICD-10-CM | POA: Diagnosis not present

## 2020-06-23 ENCOUNTER — Other Ambulatory Visit: Payer: Self-pay

## 2020-06-23 ENCOUNTER — Ambulatory Visit
Admission: RE | Admit: 2020-06-23 | Discharge: 2020-06-23 | Disposition: A | Discharge status: Home / Self Care | Payer: Medicare HMO | Source: Ambulatory Visit | Attending: Radiation Oncology | Admitting: Radiation Oncology

## 2020-06-23 DIAGNOSIS — C772 Secondary and unspecified malignant neoplasm of intra-abdominal lymph nodes: Secondary | ICD-10-CM | POA: Diagnosis not present

## 2020-06-23 DIAGNOSIS — Z510 Encounter for antineoplastic radiation therapy: Secondary | ICD-10-CM | POA: Diagnosis not present

## 2020-06-23 DIAGNOSIS — C7951 Secondary malignant neoplasm of bone: Secondary | ICD-10-CM | POA: Diagnosis not present

## 2020-06-23 DIAGNOSIS — Z5111 Encounter for antineoplastic chemotherapy: Secondary | ICD-10-CM | POA: Diagnosis not present

## 2020-06-23 DIAGNOSIS — Z170 Estrogen receptor positive status [ER+]: Secondary | ICD-10-CM | POA: Diagnosis not present

## 2020-06-23 DIAGNOSIS — C50919 Malignant neoplasm of unspecified site of unspecified female breast: Secondary | ICD-10-CM

## 2020-06-23 DIAGNOSIS — C50511 Malignant neoplasm of lower-outer quadrant of right female breast: Secondary | ICD-10-CM | POA: Diagnosis not present

## 2020-06-23 DIAGNOSIS — C50911 Malignant neoplasm of unspecified site of right female breast: Secondary | ICD-10-CM | POA: Diagnosis not present

## 2020-06-23 MED ORDER — SONAFINE EX EMUL
1.0000 "application " | Freq: Two times a day (BID) | CUTANEOUS | Status: DC
  Administered 2020-06-23: 1 via TOPICAL

## 2020-06-24 ENCOUNTER — Other Ambulatory Visit: Payer: Medicare HMO

## 2020-06-24 ENCOUNTER — Inpatient Hospital Stay: Payer: Medicare HMO | Attending: Hematology and Oncology

## 2020-06-24 ENCOUNTER — Inpatient Hospital Stay: Payer: Medicare HMO

## 2020-06-24 ENCOUNTER — Ambulatory Visit
Admission: RE | Admit: 2020-06-24 | Discharge: 2020-06-24 | Disposition: A | Discharge status: Home / Self Care | Payer: Medicare HMO | Source: Ambulatory Visit | Attending: Radiation Oncology | Admitting: Radiation Oncology

## 2020-06-24 ENCOUNTER — Ambulatory Visit: Payer: Medicare HMO

## 2020-06-24 ENCOUNTER — Other Ambulatory Visit: Payer: Self-pay

## 2020-06-24 VITALS — BP 148/65 | HR 69 | Temp 98.2°F | Resp 18 | Ht <= 58 in | Wt 111.5 lb

## 2020-06-24 DIAGNOSIS — Z170 Estrogen receptor positive status [ER+]: Secondary | ICD-10-CM | POA: Insufficient documentation

## 2020-06-24 DIAGNOSIS — C772 Secondary and unspecified malignant neoplasm of intra-abdominal lymph nodes: Secondary | ICD-10-CM | POA: Diagnosis not present

## 2020-06-24 DIAGNOSIS — C50511 Malignant neoplasm of lower-outer quadrant of right female breast: Secondary | ICD-10-CM | POA: Insufficient documentation

## 2020-06-24 DIAGNOSIS — Z510 Encounter for antineoplastic radiation therapy: Secondary | ICD-10-CM | POA: Diagnosis not present

## 2020-06-24 DIAGNOSIS — Z5111 Encounter for antineoplastic chemotherapy: Secondary | ICD-10-CM | POA: Insufficient documentation

## 2020-06-24 DIAGNOSIS — C7951 Secondary malignant neoplasm of bone: Secondary | ICD-10-CM | POA: Diagnosis not present

## 2020-06-24 DIAGNOSIS — C50911 Malignant neoplasm of unspecified site of right female breast: Secondary | ICD-10-CM | POA: Diagnosis not present

## 2020-06-24 LAB — CBC WITH DIFFERENTIAL (CANCER CENTER ONLY)
Abs Immature Granulocytes: 0.01 10*3/uL (ref 0.00–0.07)
Basophils Absolute: 0 10*3/uL (ref 0.0–0.1)
Basophils Relative: 1 %
Eosinophils Absolute: 0.3 10*3/uL (ref 0.0–0.5)
Eosinophils Relative: 7 %
HCT: 33.7 % — ABNORMAL LOW (ref 36.0–46.0)
Hemoglobin: 11.7 g/dL — ABNORMAL LOW (ref 12.0–15.0)
Immature Granulocytes: 0 %
Lymphocytes Relative: 20 %
Lymphs Abs: 0.8 10*3/uL (ref 0.7–4.0)
MCH: 33.6 pg (ref 26.0–34.0)
MCHC: 34.7 g/dL (ref 30.0–36.0)
MCV: 96.8 fL (ref 80.0–100.0)
Monocytes Absolute: 0.4 10*3/uL (ref 0.1–1.0)
Monocytes Relative: 9 %
Neutro Abs: 2.7 10*3/uL (ref 1.7–7.7)
Neutrophils Relative %: 63 %
Platelet Count: 279 10*3/uL (ref 150–400)
RBC: 3.48 MIL/uL — ABNORMAL LOW (ref 3.87–5.11)
RDW: 13.8 % (ref 11.5–15.5)
WBC Count: 4.3 10*3/uL (ref 4.0–10.5)
nRBC: 0 % (ref 0.0–0.2)

## 2020-06-24 LAB — CMP (CANCER CENTER ONLY)
ALT: 18 U/L (ref 0–44)
AST: 33 U/L (ref 15–41)
Albumin: 3.7 g/dL (ref 3.5–5.0)
Alkaline Phosphatase: 59 U/L (ref 38–126)
Anion gap: 9 (ref 5–15)
BUN: 23 mg/dL (ref 8–23)
CO2: 29 mmol/L (ref 22–32)
Calcium: 10.3 mg/dL (ref 8.9–10.3)
Chloride: 92 mmol/L — ABNORMAL LOW (ref 98–111)
Creatinine: 0.98 mg/dL (ref 0.44–1.00)
GFR, Est AFR Am: >60 mL/min (ref >60)
GFR, Estimated: 56 mL/min — ABNORMAL LOW (ref >60)
Glucose, Bld: 105 mg/dL — ABNORMAL HIGH (ref 70–99)
Potassium: 4.4 mmol/L (ref 3.5–5.1)
Sodium: 130 mmol/L — ABNORMAL LOW (ref 135–145)
Total Bilirubin: 0.6 mg/dL (ref 0.3–1.2)
Total Protein: 8.2 g/dL — ABNORMAL HIGH (ref 6.5–8.1)

## 2020-06-24 MED ORDER — SODIUM CHLORIDE 0.9 % IV SOLN
Freq: Once | INTRAVENOUS | Status: AC
  Filled 2020-06-24: qty 250

## 2020-06-24 MED ORDER — ZOLEDRONIC ACID 4 MG/100ML IV SOLN
INTRAVENOUS | Status: AC
  Filled 2020-06-24: qty 100

## 2020-06-24 MED ORDER — FULVESTRANT 250 MG/5ML IM SOLN
500.0000 mg | Freq: Once | INTRAMUSCULAR | Status: AC
  Administered 2020-06-24: 500 mg via INTRAMUSCULAR

## 2020-06-24 MED ORDER — FULVESTRANT 250 MG/5ML IM SOLN
INTRAMUSCULAR | Status: AC
  Filled 2020-06-24: qty 5

## 2020-06-24 MED ORDER — ZOLEDRONIC ACID 4 MG/5ML IV CONC
3.0000 mg | Freq: Once | INTRAVENOUS | Status: AC
  Administered 2020-06-24: 3 mg via INTRAVENOUS
  Filled 2020-06-24: qty 3.75

## 2020-06-24 NOTE — Patient Instructions (Signed)
Zoledronic Acid injection (Hypercalcemia, Oncology) What is this medicine? ZOLEDRONIC ACID (ZOE le dron ik AS id) lowers the amount of calcium loss from bone. It is used to treat too much calcium in your blood from cancer. It is also used to prevent complications of cancer that has spread to the bone. This medicine may be used for other purposes; ask your health care provider or pharmacist if you have questions. COMMON BRAND NAME(S): Zometa What should I tell my health care provider before I take this medicine? They need to know if you have any of these conditions:  aspirin-sensitive asthma  cancer, especially if you are receiving medicines used to treat cancer  dental disease or wear dentures  infection  kidney disease  receiving corticosteroids like dexamethasone or prednisone  an unusual or allergic reaction to zoledronic acid, other medicines, foods, dyes, or preservatives  pregnant or trying to get pregnant  breast-feeding How should I use this medicine? This medicine is for infusion into a vein. It is given by a health care professional in a hospital or clinic setting. Talk to your pediatrician regarding the use of this medicine in children. Special care may be needed. Overdosage: If you think you have taken too much of this medicine contact a poison control center or emergency room at once. NOTE: This medicine is only for you. Do not share this medicine with others. What if I miss a dose? It is important not to miss your dose. Call your doctor or health care professional if you are unable to keep an appointment. What may interact with this medicine?  certain antibiotics given by injection  NSAIDs, medicines for pain and inflammation, like ibuprofen or naproxen  some diuretics like bumetanide, furosemide  teriparatide  thalidomide This list may not describe all possible interactions. Give your health care provider a list of all the medicines, herbs, non-prescription  drugs, or dietary supplements you use. Also tell them if you smoke, drink alcohol, or use illegal drugs. Some items may interact with your medicine. What should I watch for while using this medicine? Visit your doctor or health care professional for regular checkups. It may be some time before you see the benefit from this medicine. Do not stop taking your medicine unless your doctor tells you to. Your doctor may order blood tests or other tests to see how you are doing. Women should inform their doctor if they wish to become pregnant or think they might be pregnant. There is a potential for serious side effects to an unborn child. Talk to your health care professional or pharmacist for more information. You should make sure that you get enough calcium and vitamin D while you are taking this medicine. Discuss the foods you eat and the vitamins you take with your health care professional. Some people who take this medicine have severe bone, joint, and/or muscle pain. This medicine may also increase your risk for jaw problems or a broken thigh bone. Tell your doctor right away if you have severe pain in your jaw, bones, joints, or muscles. Tell your doctor if you have any pain that does not go away or that gets worse. Tell your dentist and dental surgeon that you are taking this medicine. You should not have major dental surgery while on this medicine. See your dentist to have a dental exam and fix any dental problems before starting this medicine. Take good care of your teeth while on this medicine. Make sure you see your dentist for regular follow-up   appointments. What side effects may I notice from receiving this medicine? Side effects that you should report to your doctor or health care professional as soon as possible:  allergic reactions like skin rash, itching or hives, swelling of the face, lips, or tongue  anxiety, confusion, or depression  breathing problems  changes in vision  eye  pain  feeling faint or lightheaded, falls  jaw pain, especially after dental work  mouth sores  muscle cramps, stiffness, or weakness  redness, blistering, peeling or loosening of the skin, including inside the mouth  trouble passing urine or change in the amount of urine Side effects that usually do not require medical attention (report to your doctor or health care professional if they continue or are bothersome):  bone, joint, or muscle pain  constipation  diarrhea  fever  hair loss  irritation at site where injected  loss of appetite  nausea, vomiting  stomach upset  trouble sleeping  trouble swallowing  weak or tired This list may not describe all possible side effects. Call your doctor for medical advice about side effects. You may report side effects to FDA at 1-800-FDA-1088. Where should I keep my medicine? This drug is given in a hospital or clinic and will not be stored at home. NOTE: This sheet is a summary. It may not cover all possible information. If you have questions about this medicine, talk to your doctor, pharmacist, or health care provider.  2020 Elsevier/Gold Standard (2014-04-19 14:19:39)  

## 2020-06-25 ENCOUNTER — Other Ambulatory Visit: Payer: Self-pay

## 2020-06-25 ENCOUNTER — Ambulatory Visit
Admission: RE | Admit: 2020-06-25 | Discharge: 2020-06-25 | Disposition: A | Discharge status: Home / Self Care | Payer: Medicare HMO | Source: Ambulatory Visit | Attending: Radiation Oncology | Admitting: Radiation Oncology

## 2020-06-25 DIAGNOSIS — Z510 Encounter for antineoplastic radiation therapy: Secondary | ICD-10-CM | POA: Diagnosis not present

## 2020-06-25 DIAGNOSIS — C50911 Malignant neoplasm of unspecified site of right female breast: Secondary | ICD-10-CM | POA: Diagnosis not present

## 2020-06-25 DIAGNOSIS — Z170 Estrogen receptor positive status [ER+]: Secondary | ICD-10-CM | POA: Diagnosis not present

## 2020-06-25 DIAGNOSIS — C772 Secondary and unspecified malignant neoplasm of intra-abdominal lymph nodes: Secondary | ICD-10-CM | POA: Diagnosis not present

## 2020-06-25 MED FILL — IBRANCE 75 MG TABS: 75 | 28 days supply | Qty: 14 | Fill #5

## 2020-06-26 ENCOUNTER — Ambulatory Visit: Payer: Medicare HMO

## 2020-06-26 ENCOUNTER — Other Ambulatory Visit: Payer: Self-pay

## 2020-06-26 ENCOUNTER — Ambulatory Visit
Admission: RE | Admit: 2020-06-26 | Discharge: 2020-06-26 | Disposition: A | Discharge status: Home / Self Care | Payer: Medicare HMO | Source: Ambulatory Visit | Attending: Radiation Oncology | Admitting: Radiation Oncology

## 2020-06-26 DIAGNOSIS — C50911 Malignant neoplasm of unspecified site of right female breast: Secondary | ICD-10-CM | POA: Diagnosis not present

## 2020-06-26 DIAGNOSIS — Z510 Encounter for antineoplastic radiation therapy: Secondary | ICD-10-CM | POA: Diagnosis not present

## 2020-06-26 DIAGNOSIS — C772 Secondary and unspecified malignant neoplasm of intra-abdominal lymph nodes: Secondary | ICD-10-CM | POA: Diagnosis not present

## 2020-06-26 DIAGNOSIS — Z170 Estrogen receptor positive status [ER+]: Secondary | ICD-10-CM | POA: Diagnosis not present

## 2020-06-29 ENCOUNTER — Other Ambulatory Visit: Payer: Self-pay

## 2020-06-29 ENCOUNTER — Ambulatory Visit
Admission: RE | Admit: 2020-06-29 | Discharge: 2020-06-29 | Disposition: A | Discharge status: Home / Self Care | Payer: Medicare HMO | Source: Ambulatory Visit | Attending: Radiation Oncology | Admitting: Radiation Oncology

## 2020-06-29 DIAGNOSIS — C50911 Malignant neoplasm of unspecified site of right female breast: Secondary | ICD-10-CM | POA: Diagnosis not present

## 2020-06-29 DIAGNOSIS — C772 Secondary and unspecified malignant neoplasm of intra-abdominal lymph nodes: Secondary | ICD-10-CM | POA: Diagnosis not present

## 2020-06-29 DIAGNOSIS — Z170 Estrogen receptor positive status [ER+]: Secondary | ICD-10-CM | POA: Diagnosis not present

## 2020-06-29 DIAGNOSIS — Z510 Encounter for antineoplastic radiation therapy: Secondary | ICD-10-CM | POA: Diagnosis not present

## 2020-06-30 ENCOUNTER — Ambulatory Visit
Admission: RE | Admit: 2020-06-30 | Discharge: 2020-06-30 | Disposition: A | Discharge status: Home / Self Care | Payer: Medicare HMO | Source: Ambulatory Visit | Attending: Radiation Oncology | Admitting: Radiation Oncology

## 2020-06-30 ENCOUNTER — Other Ambulatory Visit: Payer: Self-pay

## 2020-06-30 DIAGNOSIS — C50911 Malignant neoplasm of unspecified site of right female breast: Secondary | ICD-10-CM | POA: Diagnosis not present

## 2020-06-30 DIAGNOSIS — Z170 Estrogen receptor positive status [ER+]: Secondary | ICD-10-CM | POA: Diagnosis not present

## 2020-06-30 DIAGNOSIS — Z510 Encounter for antineoplastic radiation therapy: Secondary | ICD-10-CM | POA: Diagnosis not present

## 2020-06-30 DIAGNOSIS — C772 Secondary and unspecified malignant neoplasm of intra-abdominal lymph nodes: Secondary | ICD-10-CM | POA: Diagnosis not present

## 2020-07-01 ENCOUNTER — Other Ambulatory Visit: Payer: Self-pay

## 2020-07-01 ENCOUNTER — Ambulatory Visit
Admission: RE | Admit: 2020-07-01 | Discharge: 2020-07-01 | Disposition: A | Discharge status: Home / Self Care | Payer: Medicare HMO | Source: Ambulatory Visit | Attending: Radiation Oncology | Admitting: Radiation Oncology

## 2020-07-01 DIAGNOSIS — C50911 Malignant neoplasm of unspecified site of right female breast: Secondary | ICD-10-CM | POA: Diagnosis not present

## 2020-07-01 DIAGNOSIS — Z510 Encounter for antineoplastic radiation therapy: Secondary | ICD-10-CM | POA: Diagnosis not present

## 2020-07-01 DIAGNOSIS — C772 Secondary and unspecified malignant neoplasm of intra-abdominal lymph nodes: Secondary | ICD-10-CM | POA: Diagnosis not present

## 2020-07-01 DIAGNOSIS — Z170 Estrogen receptor positive status [ER+]: Secondary | ICD-10-CM | POA: Diagnosis not present

## 2020-07-02 ENCOUNTER — Other Ambulatory Visit: Payer: Self-pay

## 2020-07-02 ENCOUNTER — Ambulatory Visit
Admission: RE | Admit: 2020-07-02 | Discharge: 2020-07-02 | Disposition: A | Discharge status: Home / Self Care | Payer: Medicare HMO | Source: Ambulatory Visit | Attending: Radiation Oncology | Admitting: Radiation Oncology

## 2020-07-02 DIAGNOSIS — Z170 Estrogen receptor positive status [ER+]: Secondary | ICD-10-CM | POA: Diagnosis not present

## 2020-07-02 DIAGNOSIS — C50911 Malignant neoplasm of unspecified site of right female breast: Secondary | ICD-10-CM | POA: Diagnosis not present

## 2020-07-02 DIAGNOSIS — C772 Secondary and unspecified malignant neoplasm of intra-abdominal lymph nodes: Secondary | ICD-10-CM | POA: Diagnosis not present

## 2020-07-02 DIAGNOSIS — Z510 Encounter for antineoplastic radiation therapy: Secondary | ICD-10-CM | POA: Diagnosis not present

## 2020-07-03 ENCOUNTER — Encounter: Payer: Self-pay | Admitting: Radiation Oncology

## 2020-07-03 ENCOUNTER — Other Ambulatory Visit: Payer: Self-pay

## 2020-07-03 ENCOUNTER — Ambulatory Visit
Admission: RE | Admit: 2020-07-03 | Discharge: 2020-07-03 | Disposition: A | Discharge status: Home / Self Care | Payer: Medicare HMO | Source: Ambulatory Visit | Attending: Radiation Oncology | Admitting: Radiation Oncology

## 2020-07-03 DIAGNOSIS — C772 Secondary and unspecified malignant neoplasm of intra-abdominal lymph nodes: Secondary | ICD-10-CM | POA: Diagnosis not present

## 2020-07-03 DIAGNOSIS — C50911 Malignant neoplasm of unspecified site of right female breast: Secondary | ICD-10-CM | POA: Diagnosis not present

## 2020-07-03 DIAGNOSIS — Z510 Encounter for antineoplastic radiation therapy: Secondary | ICD-10-CM | POA: Diagnosis not present

## 2020-07-03 DIAGNOSIS — Z170 Estrogen receptor positive status [ER+]: Secondary | ICD-10-CM | POA: Diagnosis not present

## 2020-07-13 NOTE — Progress Notes (Incomplete)
  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 She did report diffuse body aches, decreased appetite, generalized discomfort in her abdomen, fatigue, occasional nausea that was tolerable, and low back pain. She denied diarrhea, vomiting, vaginal bleeding, rectal bleeding, skin changes, and dysuria.  Plan: The patient will follow-up with radiation oncology in one month.  ________________________________________________   Blair Promise, PhD, MD  This document serves as a record of services personally performed by Gery Pray, MD. It was created on his behalf by Clerance Lav, a trained medical scribe. The creation of this record is based on the scribe's personal observations and the provider's statements to them. This document has been checked and approved by the attending provider.

## 2020-07-23 NOTE — Progress Notes (Signed)
29  Patient Care Team: [name], MD as PCP - General (Internal Medicine) [name], MD as Consulting Physician (Hematology and Oncology)  DIAGNOSIS:    ICD-10-CM   1. Bone metastases (HCC)  C79.51 CT CHEST ABDOMEN PELVIS W CONTRAST  2. Malignant neoplasm of lower-outer quadrant of right breast of female, estrogen receptor positive ([place])  C50.511 CT CHEST ABDOMEN PELVIS W CONTRAST   Z17.0     SUMMARY OF ONCOLOGIC HISTORY: Oncology History  Breast cancer of lower-outer quadrant of right female breast ([place])  01/04/1995 Surgery   Right breast cancer status post lumpectomy followed by chemotherapy followed by radiation and tamoxifen 5 years (details are not available)   12/30/2015 Relapse/Recurrence   Chest wall/ soft tissue masses near the right side of the sternum, superior nodule measuring 14.5 mm, inferior nodule measuring 10.5 mm, 26 mm anterior mediastinal soft tissue mass, T9 bone metastases   12/30/2015 Procedure   Thoracentesis: 400 mL removed; showed reactive mesothelial cells, no cancer cells   12/30/2015 Imaging   CT chest: 26 x 17 mm anterior mediastinal soft tissue mass, mixed lytic and sclerotic bone mets mainly involving sternum and T9 vertebral body, scattered vertebral body involvement and rib involvement; chest wall soft tissue masses 14.5 mm, 10.5 mm   01/13/2016 Initial Biopsy   Right chest wall mass biopsy 2:30 position: Invasive ductal carcinoma focally involves skeletal muscle, grade 2, HER-2 negative ratio 1.08, ER/PR 95% positive   01/14/2016 PET scan   Widespread metastatic disease right breast, right chest wall, mediastinum, right internal mammary lymph nodes, right pleural space, malignant pleural effusion, right common iliac lymph node, bone metastases in the thorax   01/21/2016 -  Anti-estrogen oral therapy   Ibrance with letrozole   04/15/2017 - 04/23/2017 Hospital Admission   Pneumonia and confusion   06/15/2020 Miscellaneous   Caris mutation testing:  ER/PR +90%, ER +90%, PI K3 CA mutation present, BRCA 1 and 2 neg, ESR 1 mutation not detected, TMB 7, genomic LOH low, MAP3K1 pathogenic mutation present.   06/17/2020 - 07/03/2020 Radiation Therapy   Palliative radiation therapy to the retroperitoneal mass     CHIEF COMPLIANT: Follow-up of metastatic breast cancer on Ibrance withFaslodex  INTERVAL HISTORY: Jessica Zavala is a 74 y.o. with above-mentioned history of metastatic breast cancercurrently on treatment withIbrance and Faslodex, and completed palliative radiation on 07/03/20.She presents to the clinic todayfor follow-up.  She completed radiation therapy and she does feel more tired than before.  She also feels nauseated.  She has not thrown up.  She feels dizzy when she gets up.  Denies any headaches or blurred vision.  She is here today to discuss future treatment options.  ALLERGIES:  is allergic to aspirin.  MEDICATIONS:  Current Outpatient Medications  Medication Sig Dispense Refill  . acetaminophen (TYLENOL) 500 MG tablet Take 500 mg by mouth every 6 (six) hours as needed.    Marland Kitchen atorvastatin (LIPITOR) 20 MG tablet Take 1 tablet (20 mg total) by mouth daily. 30 tablet 11  . clobetasol cream (TEMOVATE) 2.95 % Apply 1 application topically 2 (two) times daily as needed. 30 g 0  . clotrimazole-betamethasone (LOTRISONE) cream Apply 1 application topically 2 (two) times daily as needed (skin irritation). 45 g 0  . furosemide (LASIX) 20 MG tablet Take 1 tablet (20 mg total) by mouth daily. (Patient not taking: Reported on 06/03/2020) 30 tablet 0  . IBRANCE 75 MG tablet TAKE 1 TABLET (75 MG TOTAL) BY MOUTH DAILY. TAKE FOR 14  DAYS ON, 14 DAYS OFF, REPEAT EVERY 28 DAYS. 14 tablet 6  . labetalol (NORMODYNE) 200 MG tablet Take 1 tablet (200 mg total) by mouth 2 (two) times daily. 30 tablet 1  . letrozole (FEMARA) 2.5 MG tablet Take 1 tablet (2.5 mg total) by mouth daily. 90 tablet 3   No current facility-administered medications for this  visit.    PHYSICAL EXAMINATION: ECOG PERFORMANCE STATUS: 2 - Symptomatic, <50% confined to bed  Vitals:   76/20/21 1059  BP: (!) 98/56  Pulse: 77  Resp: 18  Temp: 97.8 F (36.6 C)  SpO2: 98%   Filed Weights   76/20/21 1059  Weight: 107 lb 9.6 oz (48.8 kg)    LABORATORY DATA:  I have reviewed the data as listed CMP Latest Ref Rng & Units 06/24/2020 03/24/2020 12/02/2019  Glucose 70 - 99 mg/dL 105(H) 83 98  BUN 8 - 23 mg/dL 23 21 27(H)  Creatinine 0.44 - 1.00 mg/dL 0.98 1.16(H) 0.99  Sodium 135 - 145 mmol/L 130(L) 136 137  Potassium 3.5 - 5.1 mmol/L 4.4 3.6 4.2  Chloride 98 - 111 mmol/L 92(L) 97(L) 97(L)  CO2 22 - 32 mmol/L '29 30 29  ' Calcium 8.9 - 10.3 mg/dL 10.3 9.1 9.3  Total Protein 6.5 - 8.1 g/dL 8.2(H) 7.8 7.8  Total Bilirubin 0.3 - 1.2 mg/dL 0.6 0.4 0.3  Alkaline Phos 38 - 126 U/L 59 62 51  AST 15 - 41 U/L 33 25 31  ALT 0 - 44 U/L '18 12 16    ' Lab Results  Component Value Date   WBC 4.3 06/24/2020   HGB 11.7 (L) 06/24/2020   HCT 33.7 (L) 06/24/2020   MCV 96.8 06/24/2020   PLT 279 06/24/2020   NEUTROABS 2.7 06/24/2020    ASSESSMENT & PLAN:  Breast cancer of lower-outer quadrant of right female breast ([place]) Metastatic breast cancer PET-CT 01/14/16: Widespread metastatic disease right breast, right chest wall several nodules largest 3.7 cm, mediastinum, right internal mammary lymph node 2.2 cm, right pleural-based nodules 2.1cm and 2.7 cm, malignant pleural effusion, right common iliac lymph node, bone metastases in the thorax (sternum, manubrium, multiple thoracic vertebral bodies, multiple right-sided ribs)  Right chest wall mass biopsy 2:30 position 01/13/2016: Invasive ductal carcinoma focally involves skeletal muscle, grade 2, HER-2 negative ratio 1.08, ER/PR pending (Right breast cancer 1996 status post lumpectomy followed by chemotherapy followed by radiation and tamoxifen 5 years (details are not  available)) ---------------------------------------------------------------------------------------------------------------------------------------------------------- Treatment plan: Ibrance with letrozole started 01/20/2016 , cycle 3 Ibrance 100 mg, Cycle 4 Ibrance 75 mg dose; switch to 75 mg 3 weeks on 2 weeks offfrom 07/22/2016, changed to 2 weeks on 2 weeks off12/12/2015.Faslodex added 08/29/2019  Bone scan 04/03/2020: Similar bone scan evidence of multifocal metastatic disease compared to prior exam. Mostly involving thoracic upper lumbar spine and sternum. CT CAP 04/03/2020: Enlarging soft tissue mass right retroperitoneum with worsening right ureteral obstruction. Mass measures 3.3 cm previously was 2.6 cm, pulmonary nodules very minimal change. Extensive bone metastases.  Palliative radiation 06/17/2020-07/03/2020  Caris molecular testing She will continue with her monthly injections. Resume Ibrance     Orders Placed This Encounter  Procedures  . CT CHEST ABDOMEN PELVIS W CONTRAST    Standing Status:   Future    Standing Expiration Date:   07/24/2021    Order Specific Question:   Reason for Exam (SYMPTOM  OR DIAGNOSIS REQUIRED)    Answer:   Met breast cancer restaging    Order Specific Question:  Preferred imaging location?    Answer:   [name]    Order Specific Question:   Release to patient    Answer:   Immediate    Order Specific Question:   Radiology Contrast Protocol - do NOT remove file path    Answer:   [email or web address]   The patient has a good understanding of the overall plan. she agrees with it. she will call with any problems that may develop before the next visit here.  Total time spent: 30 mins including face to face time and time spent for planning, charting and coordination of care  [name], MD 07/24/2020  I, Cloyde Reams Dorshimer, am acting as scribe for Dr. [name].  I have reviewed the above  documentation for accuracy and completeness, and I agree with the above.

## 2020-07-24 ENCOUNTER — Inpatient Hospital Stay: Payer: Medicare HMO

## 2020-07-24 ENCOUNTER — Other Ambulatory Visit: Payer: Self-pay

## 2020-07-24 ENCOUNTER — Inpatient Hospital Stay: Payer: Medicare HMO | Attending: Hematology and Oncology | Admitting: Hematology and Oncology

## 2020-07-24 VITALS — BP 98/56 | HR 77 | Temp 97.8°F | Resp 18 | Ht <= 58 in | Wt 107.6 lb

## 2020-07-24 VITALS — BP 120/69 | HR 68 | Temp 98.3°F | Resp 18

## 2020-07-24 DIAGNOSIS — Z923 Personal history of irradiation: Secondary | ICD-10-CM | POA: Diagnosis not present

## 2020-07-24 DIAGNOSIS — Z79899 Other long term (current) drug therapy: Secondary | ICD-10-CM | POA: Insufficient documentation

## 2020-07-24 DIAGNOSIS — J910 Malignant pleural effusion: Secondary | ICD-10-CM | POA: Diagnosis not present

## 2020-07-24 DIAGNOSIS — Z5111 Encounter for antineoplastic chemotherapy: Secondary | ICD-10-CM | POA: Insufficient documentation

## 2020-07-24 DIAGNOSIS — C50511 Malignant neoplasm of lower-outer quadrant of right female breast: Secondary | ICD-10-CM | POA: Insufficient documentation

## 2020-07-24 DIAGNOSIS — C7951 Secondary malignant neoplasm of bone: Secondary | ICD-10-CM | POA: Diagnosis not present

## 2020-07-24 DIAGNOSIS — Z170 Estrogen receptor positive status [ER+]: Secondary | ICD-10-CM | POA: Insufficient documentation

## 2020-07-24 MED ORDER — FULVESTRANT 250 MG/5ML IM SOLN
INTRAMUSCULAR | Status: AC
  Filled 2020-07-24: qty 10

## 2020-07-24 MED ORDER — FULVESTRANT 250 MG/5ML IM SOLN
500.0000 mg | Freq: Once | INTRAMUSCULAR | Status: AC
  Administered 2020-07-24: 500 mg via INTRAMUSCULAR

## 2020-07-24 NOTE — Assessment & Plan Note (Signed)
Metastatic breast cancer PET-CT 01/14/16: Widespread metastatic disease right breast, right chest wall several nodules largest 3.7 cm, mediastinum, right internal mammary lymph node 2.2 cm, right pleural-based nodules 2.1cm and 2.7 cm, malignant pleural effusion, right common iliac lymph node, bone metastases in the thorax (sternum, manubrium, multiple thoracic vertebral bodies, multiple right-sided ribs)  Right chest wall mass biopsy 2:30 position 01/13/2016: Invasive ductal carcinoma focally involves skeletal muscle, grade 2, HER-2 negative ratio 1.08, ER/PR pending (Right breast cancer 1996 status post lumpectomy followed by chemotherapy followed by radiation and tamoxifen 5 years (details are not available)) ---------------------------------------------------------------------------------------------------------------------------------------------------------- Treatment plan: Ibrance with letrozole started 01/20/2016 , cycle 3 Ibrance 100 mg, Cycle 4 Ibrance 75 mg dose; switch to 75 mg 3 weeks on 2 weeks offfrom 07/22/2016, changed to 2 weeks on 2 weeks off12/12/2015.Faslodex added 08/29/2019  Bone scan 04/03/2020: Similar bone scan evidence of multifocal metastatic disease compared to prior exam. Mostly involving thoracic upper lumbar spine and sternum. CT CAP 04/03/2020: Enlarging soft tissue mass right retroperitoneum with worsening right ureteral obstruction. Mass measures 3.3 cm previously was 2.6 cm, pulmonary nodules very minimal change. Extensive bone metastases.  Palliative radiation 06/17/2020-07/03/2020  Caris molecular testing She will continue with her monthly injections. Resume [facility]

## 2020-07-24 NOTE — Patient Instructions (Signed)
Fulvestrant injection ?y l thu?c g? FULVESTRANT ch?n tc d?ng c?a estrogen. Thu?c ny ???c dng ?? ?i?u tr? ung th? v. Thu?c ny c th? ???c dng cho nh?ng m?c ?ch khc; hy h?i ng??i cung c?p d?ch v? y t? ho?c d??c s? c?a mnh, n?u qu v? c th?c m?c. (CC) NHN HI?U PH? BI?N: FASLODEX Ti c?n ph?i bo cho ng??i cung c?p d?ch v? y t? c?a mnh ?i?u g tr??c khi dng thu?c ny? H? c?n bi?t li?u qu v? c b?t k? tnh tr?ng no sau ?y khng:  cc v?n ?? v? ch?y mu  b?nh gan  s? l??ng t? bo mu th?p, ch?ng h?n nh? s? l??ng b?ch c?u, ti?u c?u, ho?c h?ng c?u th?p  pha?n ??ng b?t th???ng ho??c di? ??ng v??i fulvestrant  pha?n ??ng b?t th???ng ho??c di? ??ng v??i ca?c d??c ph?m kha?c  pha?n ??ng b?t th???ng ho??c di? ??ng v??i th??c ph?m, thu?c nhu?m, ho??c ch?t ba?o qua?n  ?ang c thai ho??c ??nh co? thai  ?ang cho con bu? Ti nn s? d?ng thu?c ny nh? th? no? Thu?c ny ?? tim vo b?p th?t. Thu?c ny th??ng ???c s? d?ng b?i chuyn vin y t? trong b?nh vi?n ho?c phng m?ch. Hy bn v?i bc s? nhi khoa c?a qu v? v? vi?c dng thu?c ny ? tr? em. C th? c?n ch?m [place] ??c bi?t. Qu li?u: N?u qu v? cho r?ng mnh ? dng qu nhi?u thu?c ny, th hy lin l?c v?i trung tm ki?m sot ch?t ??c ho?c phng c?p c?u ngay l?p t?c. L?U : Thu?c ny ch? dnh ring cho qu v?. Khng chia s? thu?c ny v?i nh?ng ng??i khc. N?u ti l? qun m?t li?u th sao? ?i?u quan tr?ng l khng nn b? l? li?u thu?c no. Hy lin l?c v?i bc s? ho?c [place] vin y t? c?a mnh, n?u qu v? khng th? gi? ?ng cu?c h?n khm. Nh?ng g c th? t??ng tc v?i thu?c ny?  m?t s? thu?c ?i?u tr? ho?c phng ng?a c?c mu ?ng, ch?ng h?n nh? warfarin, enoxaparin, dalteparin, apixaban, dabigatran, v rivaroxaban [facility] sch ny c th? khng m t? ?? h?t cc t??ng tc c th? x?y ra. Hy ??a cho ng??i cung c?p d?ch v? y t? c?a mnh danh sch t?t c? cc thu?c, th?o d??c, cc thu?c khng c?n toa, ho?c cc ch? ph?m b? sung m  qu v? dng. C?ng nn bo cho h? bi?t r?ng qu v? c ht thu?c, u?ng r??u, ho?c c s? d?ng ma ty tri php hay khng. Vi th? c th? t??ng tc v?i thu?c c?a qu v?. Ti c?n ph?i theo di ?i?u g trong khi dng thu?c ny? Qu v? s? ???c theo di ch?t ch? trong khi dng thu?c ny. Qu v? s? c?n ph?i ?i lm cc xt nghi?m mu quan tr?ng trong th?i gian dng thu?c ny. Khng ???c c thai trong th?i gian dng thu?c ny ho?c trong vng t?i thi?u 1 n?m sau khi ng?ng dng thu?c. Phu? n?? co? kha? n?ng mang thai c?n pha?i co? xt nghi?m th? [place] m tnh tr??c khi b?t ??u dng thu?c na?y. Ph? n? c?n ph?i thng bo cho bc s? c?a mnh, n?u mu?n c thai ho?c ngh? r?ng c th? mnh ? c [place]. C nguy c? v? cc tc d?ng ph? nghim tr?ng ??i v?i [place] nhi. Nam gi?i c?n ph?i thng bo cho bc s? c?a mnh, n?u h? mu?n ???c lm b?. Thu?c ny c th? lm gi?m s? l??ng  tinh trng. Hy th?o lu?n v?i bc s? ho?c chuyn vin y t? ho?c d??c s? ?? bi?t thm thng tin. Khng ???c cho con b s?a m? trong khi dng thu?c ny ho?c trong vng 1 n?m sau li?u thu?c cng. Ti c th? nh?n th?y nh?ng tc d?ng ph? no khi dng thu?c ny? Nh?ng tc d?ng ph? qu v? c?n ph?i bo cho bc s? ho?c chuyn vin y t? cng s?m cng t?t:  cc ph?n ?ng d? ?ng, ch?ng h?n nh? da b? m?n ??, ng?a, n?i my ?ay, s?ng ? m?t, mi, ho?c l??i  c?m th?y chong vng, ng?t x?u, b? t  b? ?au, c?m gic nh? b? ki?n b, t, ho?c y?u ? chn  c cc d?u hi?u v tri?u ch?ng nhi?m trng, ch?ng h?n nh? s?t ho?c ?n l?nh; ho; cc tri?u ch?ng gi?ng nh? cm; ?au h?ng  xu?t huy?t m ??o Cc tc d?ng ph? khng c?n ph?i ch?m [place] y t? (hy bo cho bc s? ho?c chuyn vin y t?, n?u cc tc d?ng ph? ny ti?p di?n ho?c gy phi?n toi):  ?au v nh?c  to bn  tiu ch?y  ?au ??u  c?n b?c h?a  bu?n i ho?c i m?a  ?au ? ch? tim  ?au b?ng [facility] ny c th? khng m t? ?? h?t cc tc d?ng ph? c th? x?y ra. Xin g?i t?i bc s? c?a mnh ?? ???c c? v?n chuyn mn v?  cc tc d?ng ph?Sander Nephew v? c th? t??ng trnh cc tc d?ng ph? cho FDA theo s? [phone]. Ti nn c?t gi? thu?c c?a mnh ? ?u? Thu?c ny ???c s? d?ng b?i chuyn vin y t? ? b?nh vi?n ho?c ? phng m?ch. Qu v? s? khng ???c c?p thu?c ny ?? c?t gi? t?i nh. L?U : ?y l b?n tm t?t. N c th? khng bao hm t?t c? thng tin c th? c. N?u qu v? th?c m?c v? thu?c ny, xin trao ??i v?i bc s?, d??c s?, ho?c ng??i cung c?p d?ch v? y t? c?a mnh.  2020 Elsevier/Gold Standard (2018-05-07 00:00:00)

## 2020-07-28 ENCOUNTER — Telehealth: Payer: Self-pay | Admitting: Hematology and Oncology

## 2020-07-28 NOTE — Telephone Encounter (Signed)
Scheduled office visit per 8/20 los. Pt's injections had already been scheduled from previous LOS. Pt to get updated appt calendar at next visit per appt notes.

## 2020-07-29 ENCOUNTER — Encounter: Payer: Self-pay | Admitting: Hematology and Oncology

## 2020-08-03 ENCOUNTER — Ambulatory Visit
Admission: RE | Admit: 2020-08-03 | Discharge: 2020-08-03 | Disposition: A | Discharge status: Home / Self Care | Payer: Medicare HMO | Source: Ambulatory Visit | Attending: Radiation Oncology | Admitting: Radiation Oncology

## 2020-08-03 ENCOUNTER — Encounter: Payer: Self-pay | Admitting: Radiation Oncology

## 2020-08-03 ENCOUNTER — Other Ambulatory Visit: Payer: Self-pay

## 2020-08-03 VITALS — BP 109/59 | HR 81 | Temp 98.0°F | Resp 18 | Ht <= 58 in | Wt 109.8 lb

## 2020-08-03 DIAGNOSIS — C50511 Malignant neoplasm of lower-outer quadrant of right female breast: Secondary | ICD-10-CM | POA: Diagnosis not present

## 2020-08-03 DIAGNOSIS — Z79811 Long term (current) use of aromatase inhibitors: Secondary | ICD-10-CM | POA: Insufficient documentation

## 2020-08-03 DIAGNOSIS — Z170 Estrogen receptor positive status [ER+]: Secondary | ICD-10-CM | POA: Diagnosis not present

## 2020-08-03 DIAGNOSIS — Z923 Personal history of irradiation: Secondary | ICD-10-CM | POA: Diagnosis not present

## 2020-08-03 DIAGNOSIS — C50919 Malignant neoplasm of unspecified site of unspecified female breast: Secondary | ICD-10-CM

## 2020-08-03 DIAGNOSIS — R630 Anorexia: Secondary | ICD-10-CM | POA: Insufficient documentation

## 2020-08-03 DIAGNOSIS — Z79899 Other long term (current) drug therapy: Secondary | ICD-10-CM | POA: Insufficient documentation

## 2020-08-03 DIAGNOSIS — R110 Nausea: Secondary | ICD-10-CM | POA: Diagnosis not present

## 2020-08-03 NOTE — Progress Notes (Addendum)
Radiation Oncology         [phone] ________________________________  Name: Jessica Zavala MRN: [id number]  Date: 08/03/2020  DOB: May 22, 1946  Follow-Up Visit Note  CC: [name], MD  [name], MD    ICD-10-CM   1. Metastatic breast cancer ([place])  C50.919   2. Malignant neoplasm of lower-outer quadrant of right female breast, unspecified estrogen receptor status ([place])  C50.511     Diagnosis: Recurrent Stage IV, RightBreastUIQ,Invasive DuctalCarcinoma, ER+/ PR+/ Her2-, Grade2, with bony metastases and retroperitoneal mass  Interval Since Last Radiation: One month.  Radiation Treatment Dates: 06/16/2020 through 07/03/2020 Site Technique Total Dose (Gy) Dose per Fx (Gy) Completed Fx Beam Energies  [place], Right: Pelvis_Rt 3D 35/35 2.5 14/14 6X, 10X    Narrative:  The patient returns today for routine follow-up. Since the end of treatment, she was seen by Dr. Lindi Adie on 07/24/2020. At that time, she was to resume Ibrance and continue with her monthly injections.  On review of systems, she reports remittent episodes of nausea and a poor appetite.  She denies any emesis. She denies severe abdominal cramping or diarrhea.  Her energy level is starting to improve.  ALLERGIES:  is allergic to aspirin.  Meds: Current Outpatient Medications  Medication Sig Dispense Refill  . acetaminophen (TYLENOL) 500 MG tablet Take 500 mg by mouth every 6 (six) hours as needed.    Marland Kitchen atorvastatin (LIPITOR) 20 MG tablet Take 1 tablet (20 mg total) by mouth daily. 30 tablet 11  . clobetasol cream (TEMOVATE) 8.46 % Apply 1 application topically 2 (two) times daily as needed. 30 g 0  . clotrimazole-betamethasone (LOTRISONE) cream Apply 1 application topically 2 (two) times daily as needed (skin irritation). 45 g 0  . furosemide (LASIX) 20 MG tablet Take 1 tablet (20 mg total) by mouth daily. 30 tablet 0  . labetalol (NORMODYNE) 200 MG tablet Take 1 tablet (200 mg total) by mouth 2 (two) times  daily. 30 tablet 1  . letrozole (FEMARA) 2.5 MG tablet Take 1 tablet (2.5 mg total) by mouth daily. 90 tablet 3   No current facility-administered medications for this encounter.    Physical Findings: The patient is in no acute distress. Patient is alert and oriented.  height is _0  (1.448 m) and weight is 109 lb 12.8 oz (49.8 kg). Her temperature is 98 F (36.7 C). Her blood pressure is 109/59 (abnormal) and her pulse is 81. Her respiration is 18 and oxygen saturation is 97%.   No significant changes. Lungs are clear to auscultation bilaterally. Heart has regular rate and rhythm. No palpable cervical, supraclavicular, or axillary adenopathy. Abdomen soft, non-tender, normal bowel sounds.  No rebound or guarding   Lab Findings: Lab Results  Component Value Date   WBC 4.3 06/24/2020   HGB 11.7 (L) 06/24/2020   HCT 33.7 (L) 06/24/2020   MCV 96.8 06/24/2020   PLT 279 06/24/2020    Radiographic Findings: No results found.  Impression: Recurrent Stage IV, RightBreastUIQ,Invasive DuctalCarcinoma, ER+/ PR+/ Her2-, Grade2, with bony metastases and retroperitoneal mass  The patient is recovering from the effects of radiation.  She will start back on Ibrance early next month  Plan: The patient is scheduled to follow-up with Dr. Lindi Adie on 10/27/2020. She will undergo CT scans of her chest, abdomen, and pelvis prior to that visit. She will follow-up with radiation oncology in 3 months after her upcoming CT scans.   ____________________________________   Blair Promise, PhD, MD  This document  serves as a record of services personally performed by Gery Pray, MD. It was created on his behalf by Clerance Lav, a trained medical scribe. The creation of this record is based on the scribe's personal observations and the provider's statements to them. This document has been checked and approved by the attending provider.

## 2020-08-03 NOTE — Progress Notes (Signed)
Patient here for a one month f/u visit with Dr. Sondra Come. Patient's husband is with her and the [place] interpreter Faythe Casa is present.Patient denies any problems with her right breast. She reports a decreased appetite and drinks 1 Ensure a day. She reports abdominal pain that radiates to her back. that is an 8 when she is active and eases to a 3 when resting. Patient reports pain is 50% better since radiation ended.  BP (!) 109/59 (BP Location: Left Arm, Patient Position: Sitting, Cuff Size: Normal)   Pulse 81   Temp 98 F (36.7 C)   Resp 18   Ht 4\' 9"  (1.448 m)   Wt 109 lb 12.8 oz (49.8 kg)   SpO2 97%   BMI 23.76 kg/m   Wt Readings from Last 3 Encounters:  08/03/20 109 lb 12.8 oz (49.8 kg)  07/24/20 107 lb 9.6 oz (48.8 kg)  06/24/20 111 lb 8 oz (50.6 kg)

## 2020-08-20 ENCOUNTER — Encounter: Payer: Self-pay | Admitting: Hematology and Oncology

## 2020-08-21 ENCOUNTER — Telehealth: Payer: Self-pay | Admitting: *Deleted

## 2020-08-21 NOTE — Telephone Encounter (Signed)
This RN returned call to the pt's son per his VM stating concern due to " my mom had the radiation but her legs are still weak and she has to use a walker "    Per discussion - Roland states leg weakness is not getting worse - " just not improving and her appetite is good so not sure why she is not getting stronger "  This RN review pt's diagnosis and known metastatic disease - and issue may be more of bone weakness from the known cancer then muscle weakness.  Roland did not know if pt has had bowel or bladder changes " she has not said anything to me about that "  This RN informed Roland his concerns would be reviewed by MD upon his return to the office.  Pt is scheduled for injection on 9/23 with next MD appointment 11/23.

## 2020-08-26 ENCOUNTER — Encounter: Payer: Self-pay | Admitting: Hematology and Oncology

## 2020-08-27 ENCOUNTER — Telehealth: Payer: Self-pay

## 2020-08-27 ENCOUNTER — Inpatient Hospital Stay: Payer: Medicare HMO | Attending: Hematology and Oncology

## 2020-08-27 ENCOUNTER — Other Ambulatory Visit: Payer: Self-pay

## 2020-08-27 VITALS — BP 120/61 | HR 70 | Resp 16

## 2020-08-27 DIAGNOSIS — C7951 Secondary malignant neoplasm of bone: Secondary | ICD-10-CM | POA: Diagnosis not present

## 2020-08-27 DIAGNOSIS — Z923 Personal history of irradiation: Secondary | ICD-10-CM | POA: Diagnosis not present

## 2020-08-27 DIAGNOSIS — C50511 Malignant neoplasm of lower-outer quadrant of right female breast: Secondary | ICD-10-CM | POA: Insufficient documentation

## 2020-08-27 DIAGNOSIS — C778 Secondary and unspecified malignant neoplasm of lymph nodes of multiple regions: Secondary | ICD-10-CM | POA: Diagnosis not present

## 2020-08-27 DIAGNOSIS — Z170 Estrogen receptor positive status [ER+]: Secondary | ICD-10-CM | POA: Insufficient documentation

## 2020-08-27 DIAGNOSIS — Z5111 Encounter for antineoplastic chemotherapy: Secondary | ICD-10-CM | POA: Insufficient documentation

## 2020-08-27 MED ORDER — FULVESTRANT 250 MG/5ML IM SOLN
500.0000 mg | Freq: Once | INTRAMUSCULAR | Status: AC
  Administered 2020-08-27: 500 mg via INTRAMUSCULAR

## 2020-08-27 MED ORDER — FULVESTRANT 250 MG/5ML IM SOLN
INTRAMUSCULAR | Status: AC
  Filled 2020-08-27: qty 10

## 2020-08-27 NOTE — Telephone Encounter (Signed)
Pt's son, [name] called and LVM requesting a wheelchair, and vaguely understood there was a question about injection. Attempted to return call to son, he did not answer. LVM to return call.

## 2020-08-27 NOTE — Patient Instructions (Signed)
Fulvestrant injection What is this medicine? FULVESTRANT (ful VES trant) blocks the effects of estrogen. It is used to treat breast cancer. This medicine may be used for other purposes; ask your health care provider or pharmacist if you have questions. COMMON BRAND NAME(S): FASLODEX What should I tell my health care provider before I take this medicine? They need to know if you have any of these conditions:  bleeding disorders  liver disease  low blood counts, like low white cell, platelet, or red cell counts  an unusual or allergic reaction to fulvestrant, other medicines, foods, dyes, or preservatives  pregnant or trying to get pregnant  breast-feeding How should I use this medicine? This medicine is for injection into a muscle. It is usually given by a health care professional in a hospital or clinic setting. Talk to your pediatrician regarding the use of this medicine in children. Special care may be needed. Overdosage: If you think you have taken too much of this medicine contact a poison control center or emergency room at once. NOTE: This medicine is only for you. Do not share this medicine with others. What if I miss a dose? It is important not to miss your dose. Call your doctor or health care professional if you are unable to keep an appointment. What may interact with this medicine?  medicines that treat or prevent blood clots like warfarin, enoxaparin, dalteparin, apixaban, dabigatran, and rivaroxaban This list may not describe all possible interactions. Give your health care provider a list of all the medicines, herbs, non-prescription drugs, or dietary supplements you use. Also tell them if you smoke, drink alcohol, or use illegal drugs. Some items may interact with your medicine. What should I watch for while using this medicine? Your condition will be monitored carefully while you are receiving this medicine. You will need important blood work done while you are taking  this medicine. Do not become pregnant while taking this medicine or for at least 1 year after stopping it. Women of child-bearing potential will need to have a negative pregnancy test before starting this medicine. Women should inform their doctor if they wish to become pregnant or think they might be pregnant. There is a potential for serious side effects to an unborn child. Men should inform their doctors if they wish to father a child. This medicine may lower sperm counts. Talk to your health care professional or pharmacist for more information. Do not breast-feed an infant while taking this medicine or for 1 year after the last dose. What side effects may I notice from receiving this medicine? Side effects that you should report to your doctor or health care professional as soon as possible:  allergic reactions like skin rash, itching or hives, swelling of the face, lips, or tongue  feeling faint or lightheaded, falls  pain, tingling, numbness, or weakness in the legs  signs and symptoms of infection like fever or chills; cough; flu-like symptoms; sore throat  vaginal bleeding Side effects that usually do not require medical attention (report to your doctor or health care professional if they continue or are bothersome):  aches, pains  constipation  diarrhea  headache  hot flashes  nausea, vomiting  pain at site where injected  stomach pain This list may not describe all possible side effects. Call your doctor for medical advice about side effects. You may report side effects to FDA at 1-800-FDA-1088. Where should I keep my medicine? This drug is given in a hospital or clinic and will   not be stored at home. NOTE: This sheet is a summary. It may not cover all possible information. If you have questions about this medicine, talk to your doctor, pharmacist, or health care provider.  2020 Elsevier/Gold Standard (2018-03-01 11:34:41)  

## 2020-08-27 NOTE — Telephone Encounter (Signed)
Pt's son, [name] called back and explained his mother, the pt is too weak to walk, and can not stand for more than 30 seconds. Pt asked if we are able to come to their home to give injection. Explained to Crane that unfortunately we are not able to come to the home for injections and she would need to be set up with home health for that. Roland states pt is eating and drinking OK, but the weakness in her legs, and hands shaking has worsened over the past week. Order will be placed for w/c per son's request, and Roland understands there is a shortage of w/c and it may take some time to get to his mother; verbalized understanding. Dr Lindi Adie will call pt later this afternoon.

## 2020-08-28 ENCOUNTER — Inpatient Hospital Stay (HOSPITAL_BASED_OUTPATIENT_CLINIC_OR_DEPARTMENT_OTHER): Payer: Medicare HMO | Admitting: Hematology and Oncology

## 2020-08-28 ENCOUNTER — Telehealth: Payer: Self-pay

## 2020-08-28 DIAGNOSIS — Z170 Estrogen receptor positive status [ER+]: Secondary | ICD-10-CM | POA: Diagnosis not present

## 2020-08-28 DIAGNOSIS — C50919 Malignant neoplasm of unspecified site of unspecified female breast: Secondary | ICD-10-CM

## 2020-08-28 DIAGNOSIS — C7951 Secondary malignant neoplasm of bone: Secondary | ICD-10-CM

## 2020-08-28 DIAGNOSIS — C50511 Malignant neoplasm of lower-outer quadrant of right female breast: Secondary | ICD-10-CM

## 2020-08-28 NOTE — Telephone Encounter (Signed)
Attempted to call Jermaine with [employer], LVM for adaptive equipment. No call back. Will continue to try.

## 2020-08-28 NOTE — Progress Notes (Signed)
MyChart virtual visit I verified the patient's identity and proceeded with a MyChart visit.  Patient Care Team: [name], MD as PCP - General (Internal Medicine) [name], MD as Consulting Physician (Hematology and Oncology)  DIAGNOSIS:  Encounter Diagnoses  Name Primary?   Metastatic breast cancer ([place]) Yes   Malignant neoplasm of lower-outer quadrant of right breast of female, estrogen receptor positive ([place])     SUMMARY OF ONCOLOGIC HISTORY: Oncology History  Breast cancer of lower-outer quadrant of right female breast ([place])  01/04/1995 Surgery   Right breast cancer status post lumpectomy followed by chemotherapy followed by radiation and tamoxifen 5 years (details are not available)   12/30/2015 Relapse/Recurrence   Chest wall/ soft tissue masses near the right side of the sternum, superior nodule measuring 14.5 mm, inferior nodule measuring 10.5 mm, 26 mm anterior mediastinal soft tissue mass, T9 bone metastases   12/30/2015 Procedure   Thoracentesis: 400 mL removed; showed reactive mesothelial cells, no cancer cells   12/30/2015 Imaging   CT chest: 26 x 17 mm anterior mediastinal soft tissue mass, mixed lytic and sclerotic bone mets mainly involving sternum and T9 vertebral body, scattered vertebral body involvement and rib involvement; chest wall soft tissue masses 14.5 mm, 10.5 mm   01/13/2016 Initial Biopsy   Right chest wall mass biopsy 2:30 position: Invasive ductal carcinoma focally involves skeletal muscle, grade 2, HER-2 negative ratio 1.08, ER/PR 95% positive   01/14/2016 PET scan   Widespread metastatic disease right breast, right chest wall, mediastinum, right internal mammary lymph nodes, right pleural space, malignant pleural effusion, right common iliac lymph node, bone metastases in the thorax   01/21/2016 -  Anti-estrogen oral therapy   Ibrance with letrozole   04/15/2017 - 04/23/2017 Hospital Admission   Pneumonia and confusion   06/15/2020  Miscellaneous   Caris mutation testing: ER/PR +90%, ER +90%, PI K3 CA mutation present, BRCA 1 and 2 neg, ESR 1 mutation not detected, TMB 7, genomic LOH low, MAP3K1 pathogenic mutation present.   06/17/2020 - 07/03/2020 Radiation Therapy   Palliative radiation therapy to the retroperitoneal mass     CHIEF COMPLIANT: Inability to walk, generalized weakness  INTERVAL HISTORY: Jessica Zavala is a 74 year old with above-mentioned for metastatic breast cancer who was supposed to be here today for follow-up but her family called [place] saying that she is not able to walk and has fallen multiple times.  She has severe generalized weakness of her lower extremities.  There family thinks that it all started after radiation was complete.  It has gotten worse over the past couple of weeks.  She can feel her lower extremities but they feel very heavy for her to lift.   ALLERGIES:  is allergic to aspirin.  MEDICATIONS:  Current Outpatient Medications  Medication Sig Dispense Refill   acetaminophen (TYLENOL) 500 MG tablet Take 500 mg by mouth every 6 (six) hours as needed.     atorvastatin (LIPITOR) 20 MG tablet Take 1 tablet (20 mg total) by mouth daily. 30 tablet 11   clobetasol cream (TEMOVATE) 3.15 % Apply 1 application topically 2 (two) times daily as needed. 30 g 0   clotrimazole-betamethasone (LOTRISONE) cream Apply 1 application topically 2 (two) times daily as needed (skin irritation). 45 g 0   furosemide (LASIX) 20 MG tablet Take 1 tablet (20 mg total) by mouth daily. 30 tablet 0   labetalol (NORMODYNE) 200 MG tablet Take 1 tablet (200 mg total) by mouth 2 (two) times daily. 30 tablet  1   letrozole (FEMARA) 2.5 MG tablet Take 1 tablet (2.5 mg total) by mouth daily. 90 tablet 3   No current facility-administered medications for this visit.    PHYSICAL EXAMINATION: ECOG PERFORMANCE STATUS: 2 - Symptomatic, <50% confined to bed      LABORATORY DATA:  I have reviewed the data as listed CMP  Latest Ref Rng & Units 06/24/2020 03/24/2020 12/02/2019  Glucose 70 - 99 mg/dL 105(H) 83 98  BUN 8 - 23 mg/dL 23 21 27(H)  Creatinine 0.44 - 1.00 mg/dL 0.98 1.16(H) 0.99  Sodium 135 - 145 mmol/L 130(L) 136 137  Potassium 3.5 - 5.1 mmol/L 4.4 3.6 4.2  Chloride 98 - 111 mmol/L 92(L) 97(L) 97(L)  CO2 22 - 32 mmol/L '29 30 29  ' Calcium 8.9 - 10.3 mg/dL 10.3 9.1 9.3  Total Protein 6.5 - 8.1 g/dL 8.2(H) 7.8 7.8  Total Bilirubin 0.3 - 1.2 mg/dL 0.6 0.4 0.3  Alkaline Phos 38 - 126 U/L 59 62 51  AST 15 - 41 U/L 33 25 31  ALT 0 - 44 U/L '18 12 16    ' Lab Results  Component Value Date   WBC 4.3 06/24/2020   HGB 11.7 (L) 06/24/2020   HCT 33.7 (L) 06/24/2020   MCV 96.8 06/24/2020   PLT 279 06/24/2020   NEUTROABS 2.7 06/24/2020    ASSESSMENT & PLAN:  Breast cancer of lower-outer quadrant of right female breast ([place]) Metastatic breast cancer PET-CT 01/14/16: Widespread metastatic disease right breast, right chest wall several nodules largest 3.7 cm, mediastinum, right internal mammary lymph node 2.2 cm, right pleural-based nodules 2.1cm and 2.7 cm, malignant pleural effusion, right common iliac lymph node, bone metastases in the thorax (sternum, manubrium, multiple thoracic vertebral bodies, multiple right-sided ribs)  Right chest wall mass biopsy 2:30 position 01/13/2016: Invasive ductal carcinoma focally involves skeletal muscle, grade 2, HER-2 negative ratio 1.08, ER/PR pending (Right breast cancer 1996 status post lumpectomy followed by chemotherapy followed by radiation and tamoxifen 5 years (details are not available)) Ibrance with letrozole started 01/20/2016, Faslodex added 08/29/2019 Palliative radiation 06/17/2020-07/03/2020 Caris molecular testing: PI K3CA mutation ----------------------------------------------------------------------------------------------------------------------------------------------------- Progressive worsening of performance status and sudden onset of lower extremity  weakness with falls Plan: 1.  Obtain MRI of the lumbar spine to evaluate for cord compression stat 2.  CT chest abdomen pelvis with contrast for restaging.   Depending on these test results we can discuss additional treatment options like alpelisib or hospice care. If there is spinal cord compression then she will need palliative radiation to the spine.   Orders Placed This Encounter  Procedures   MR Lumbar Spine W Wo Contrast    Standing Status:   Future    Standing Expiration Date:   08/28/2021    Order Specific Question:   If indicated for the ordered procedure, I authorize the administration of contrast media per Radiology protocol    Answer:   Yes    Order Specific Question:   What is the patient's sedation requirement?    Answer:   No Sedation    Order Specific Question:   Does the patient have a pacemaker or implanted devices?    Answer:   No    Order Specific Question:   Use SRS Protocol?    Answer:   No    Order Specific Question:   Radiology Contrast Protocol - do NOT remove file path    Answer:   [email or web address]    Order Specific Question:   Preferred imaging  location?    Answer:   [facility] (table limit - 550 lbs)    Order Specific Question:   Release to patient    Answer:   Immediate   CT CHEST ABDOMEN PELVIS W CONTRAST    Standing Status:   Future    Standing Expiration Date:   08/28/2021    Order Specific Question:   If indicated for the ordered procedure, I authorize the administration of contrast media per Radiology protocol    Answer:   Yes    Order Specific Question:   Preferred imaging location?    Answer:   [facility]    Order Specific Question:   Release to patient    Answer:   Immediate    Order Specific Question:   Is Oral Contrast requested for this exam?    Answer:   Yes, Per Radiology protocol    Order Specific Question:   Radiology Contrast Protocol - do NOT remove file path    Answer:    [email or web address]    Order Specific Question:   Reason for Exam (SYMPTOM  OR DIAGNOSIS REQUIRED)    Answer:   Metastatic breast cancer, restaging   The patient has a good understanding of the overall plan. she agrees with it. she will call with any problems that may develop before the next visit here. Total time spent: 30 mins including face to face time and time spent for planning, charting and co-ordination of care   Harriette Ohara, MD 08/28/20

## 2020-08-28 NOTE — Assessment & Plan Note (Signed)
Metastatic breast cancer PET-CT 01/14/16: Widespread metastatic disease right breast, right chest wall several nodules largest 3.7 cm, mediastinum, right internal mammary lymph node 2.2 cm, right pleural-based nodules 2.1cm and 2.7 cm, malignant pleural effusion, right common iliac lymph node, bone metastases in the thorax (sternum, manubrium, multiple thoracic vertebral bodies, multiple right-sided ribs)  Right chest wall mass biopsy 2:30 position 01/13/2016: Invasive ductal carcinoma focally involves skeletal muscle, grade 2, HER-2 negative ratio 1.08, ER/PR pending (Right breast cancer 1996 status post lumpectomy followed by chemotherapy followed by radiation and tamoxifen 5 years (details are not available)) Ibrance with letrozole started 01/20/2016, Faslodex added 08/29/2019 Palliative radiation 06/17/2020-07/03/2020 Caris molecular testing: PI K3CA mutation ----------------------------------------------------------------------------------------------------------------------------------------------------- Progressive worsening of performance status I discussed with the patient's family different options. Given her rapidly progressively worsening performance status, her prognosis is poor and that we could consider hospice care for her.

## 2020-08-31 ENCOUNTER — Other Ambulatory Visit: Payer: Self-pay | Admitting: *Deleted

## 2020-08-31 DIAGNOSIS — C50919 Malignant neoplasm of unspecified site of unspecified female breast: Secondary | ICD-10-CM

## 2020-08-31 DIAGNOSIS — C7951 Secondary malignant neoplasm of bone: Secondary | ICD-10-CM

## 2020-08-31 NOTE — Progress Notes (Signed)
Called pt son [name] to make aware of date and time of CT and lab appt. 10/1 labs at 830 am and CT at 945 am. Pt son verbalized understanding of contrast and appt times

## 2020-09-01 ENCOUNTER — Other Ambulatory Visit: Payer: Self-pay | Admitting: *Deleted

## 2020-09-01 ENCOUNTER — Telehealth: Payer: Self-pay | Admitting: Hematology and Oncology

## 2020-09-01 DIAGNOSIS — C50919 Malignant neoplasm of unspecified site of unspecified female breast: Secondary | ICD-10-CM

## 2020-09-01 NOTE — Progress Notes (Signed)
Received call from pt son Jori Moll stating the patient would like to undergo hospice care.  Per MD referral to be placed.  Referral placed to [place].  Referral called and placed with Alwyn Ren of [facility].

## 2020-09-01 NOTE — Telephone Encounter (Signed)
Scheduled appts per 9/24 los. Pt's son confirmed appt date and time.

## 2020-09-03 ENCOUNTER — Other Ambulatory Visit: Payer: Self-pay

## 2020-09-03 ENCOUNTER — Other Ambulatory Visit: Payer: Self-pay | Admitting: Hematology and Oncology

## 2020-09-03 ENCOUNTER — Inpatient Hospital Stay: Payer: Medicare HMO

## 2020-09-03 ENCOUNTER — Ambulatory Visit (HOSPITAL_COMMUNITY)
Admission: RE | Admit: 2020-09-03 | Discharge: 2020-09-03 | Disposition: A | Discharge status: Home / Self Care | Payer: Medicare HMO | Source: Ambulatory Visit | Attending: Hematology and Oncology | Admitting: Hematology and Oncology

## 2020-09-03 ENCOUNTER — Telehealth: Payer: Self-pay | Admitting: Hematology and Oncology

## 2020-09-03 DIAGNOSIS — C50511 Malignant neoplasm of lower-outer quadrant of right female breast: Secondary | ICD-10-CM | POA: Diagnosis not present

## 2020-09-03 DIAGNOSIS — M4807 Spinal stenosis, lumbosacral region: Secondary | ICD-10-CM | POA: Diagnosis not present

## 2020-09-03 DIAGNOSIS — C7951 Secondary malignant neoplasm of bone: Secondary | ICD-10-CM | POA: Diagnosis not present

## 2020-09-03 DIAGNOSIS — Z170 Estrogen receptor positive status [ER+]: Secondary | ICD-10-CM | POA: Diagnosis not present

## 2020-09-03 DIAGNOSIS — C50919 Malignant neoplasm of unspecified site of unspecified female breast: Secondary | ICD-10-CM | POA: Diagnosis not present

## 2020-09-03 DIAGNOSIS — Z5111 Encounter for antineoplastic chemotherapy: Secondary | ICD-10-CM | POA: Diagnosis not present

## 2020-09-03 DIAGNOSIS — M5126 Other intervertebral disc displacement, lumbar region: Secondary | ICD-10-CM | POA: Diagnosis not present

## 2020-09-03 DIAGNOSIS — Z923 Personal history of irradiation: Secondary | ICD-10-CM | POA: Diagnosis not present

## 2020-09-03 DIAGNOSIS — M48061 Spinal stenosis, lumbar region without neurogenic claudication: Secondary | ICD-10-CM | POA: Diagnosis not present

## 2020-09-03 DIAGNOSIS — C778 Secondary and unspecified malignant neoplasm of lymph nodes of multiple regions: Secondary | ICD-10-CM | POA: Diagnosis not present

## 2020-09-03 LAB — CBC WITH DIFFERENTIAL (CANCER CENTER ONLY)
Abs Immature Granulocytes: 0.05 10*3/uL (ref 0.00–0.07)
Basophils Absolute: 0 10*3/uL (ref 0.0–0.1)
Basophils Relative: 0 %
Eosinophils Absolute: 0.1 10*3/uL (ref 0.0–0.5)
Eosinophils Relative: 4 %
HCT: 24.8 % — ABNORMAL LOW (ref 36.0–46.0)
Hemoglobin: 9.3 g/dL — ABNORMAL LOW (ref 12.0–15.0)
Immature Granulocytes: 2 %
Lymphocytes Relative: 16 %
Lymphs Abs: 0.5 10*3/uL — ABNORMAL LOW (ref 0.7–4.0)
MCH: 32.4 pg (ref 26.0–34.0)
MCHC: 37.5 g/dL — ABNORMAL HIGH (ref 30.0–36.0)
MCV: 86.4 fL (ref 80.0–100.0)
Monocytes Absolute: 0.4 10*3/uL (ref 0.1–1.0)
Monocytes Relative: 14 %
Neutro Abs: 2 10*3/uL (ref 1.7–7.7)
Neutrophils Relative %: 64 %
Platelet Count: 164 10*3/uL (ref 150–400)
RBC: 2.87 MIL/uL — ABNORMAL LOW (ref 3.87–5.11)
RDW: 14.9 % (ref 11.5–15.5)
WBC Count: 3 10*3/uL — ABNORMAL LOW (ref 4.0–10.5)
nRBC: 0 % (ref 0.0–0.2)

## 2020-09-03 LAB — CMP (CANCER CENTER ONLY)
ALT: 64 U/L — ABNORMAL HIGH (ref 0–44)
AST: 138 U/L — ABNORMAL HIGH (ref 15–41)
Albumin: 3.1 g/dL — ABNORMAL LOW (ref 3.5–5.0)
Alkaline Phosphatase: 74 U/L (ref 38–126)
BUN: 15 mg/dL (ref 8–23)
CO2: 26 mmol/L (ref 22–32)
Calcium: 8.7 mg/dL — ABNORMAL LOW (ref 8.9–10.3)
Chloride: 74 mmol/L — ABNORMAL LOW (ref 98–111)
Creatinine: 0.65 mg/dL (ref 0.44–1.00)
GFR, Est AFR Am: >60 mL/min (ref >60)
GFR, Estimated: >60 mL/min (ref >60)
Glucose, Bld: 102 mg/dL — ABNORMAL HIGH (ref 70–99)
Potassium: 3.7 mmol/L (ref 3.5–5.1)
Sodium: <110 mmol/L — ABNORMAL LOW (ref 135–145)
Total Bilirubin: 1.1 mg/dL (ref 0.3–1.2)
Total Protein: 6.9 g/dL (ref 6.5–8.1)

## 2020-09-03 MED ORDER — GADOBUTROL 1 MMOL/ML IV SOLN
5.0000 mL | Freq: Once | INTRAVENOUS | Status: AC | PRN
  Administered 2020-09-03: 5 mL via INTRAVENOUS

## 2020-09-03 NOTE — Progress Notes (Signed)
I discussed with her extensively about the recent labs and MRI results. There is clear-cut evidence of progression of disease in the MRI.  There is no cord compression. Retroperitoneal tumor is still noted. There is additional psoas nodule noted.  We requested hospice to see her and as I was making this phone call hospice were visiting her. I recommended cancellation of scans that are being planned for tomorrow. I also recommended stoppage of letrozole because it is not helping her.  Our goal is palliation and quality of life. It is very difficult to predict how long she has.  However I strongly believe that her prognosis is less than 6 months.

## 2020-09-03 NOTE — Telephone Encounter (Signed)
Hospice consulted

## 2020-09-04 ENCOUNTER — Ambulatory Visit (HOSPITAL_COMMUNITY): Payer: Medicare HMO

## 2020-09-04 ENCOUNTER — Other Ambulatory Visit: Payer: Medicare HMO

## 2020-09-04 ENCOUNTER — Other Ambulatory Visit: Payer: Self-pay | Admitting: Hematology and Oncology

## 2020-09-04 MED ORDER — MORPHINE SULFATE 15 MG PO TABS
15.0000 mg | ORAL_TABLET | Freq: Four times a day (QID) | ORAL | 0 refills | Status: AC | PRN
Start: 2020-09-04 — End: ?

## 2020-09-04 MED ORDER — MORPHINE SULFATE 15 MG PO TABS: 15.0000 mg | ORAL_TABLET | Freq: Four times a day (QID) | ORAL | 0 refills | Status: DC | PRN

## 2020-09-04 NOTE — Progress Notes (Signed)
Patient is enrolled in hospice. Sent a prescription for MSIR 15 mg p.o. every 6 hours as needed for pain issues related to extensive bone metastases and retroperitoneal mass

## 2020-09-07 ENCOUNTER — Telehealth: Payer: Medicare HMO | Admitting: Hematology and Oncology

## 2020-09-25 ENCOUNTER — Ambulatory Visit: Payer: Medicare HMO

## 2020-10-27 ENCOUNTER — Ambulatory Visit: Payer: Medicare HMO

## 2020-10-27 ENCOUNTER — Ambulatory Visit: Payer: Medicare HMO | Admitting: Hematology and Oncology

## 2020-11-26 ENCOUNTER — Ambulatory Visit: Payer: Medicare HMO

## 2021-01-05 DEATH — deceased
